# Patient Record
Sex: Male | Born: 1950 | Race: White | Hispanic: No | Marital: Married
Health system: Midwestern US, Community
[De-identification: ages and names within clinical notes are randomized; demographics above are authoritative.]

## PROBLEM LIST (undated history)

## (undated) DIAGNOSIS — M7989 Other specified soft tissue disorders: Secondary | ICD-10-CM

## (undated) DIAGNOSIS — N400 Enlarged prostate without lower urinary tract symptoms: Secondary | ICD-10-CM

## (undated) DIAGNOSIS — G4733 Obstructive sleep apnea (adult) (pediatric): Secondary | ICD-10-CM

## (undated) DIAGNOSIS — Z9989 Dependence on other enabling machines and devices: Secondary | ICD-10-CM

## (undated) DIAGNOSIS — I2510 Atherosclerotic heart disease of native coronary artery without angina pectoris: Secondary | ICD-10-CM

## (undated) DIAGNOSIS — I10 Essential (primary) hypertension: Secondary | ICD-10-CM

## (undated) DIAGNOSIS — R0681 Apnea, not elsewhere classified: Secondary | ICD-10-CM

## (undated) DIAGNOSIS — E669 Obesity, unspecified: Secondary | ICD-10-CM

## (undated) DIAGNOSIS — G4730 Sleep apnea, unspecified: Secondary | ICD-10-CM

## (undated) DIAGNOSIS — E785 Hyperlipidemia, unspecified: Secondary | ICD-10-CM

## (undated) DIAGNOSIS — K219 Gastro-esophageal reflux disease without esophagitis: Secondary | ICD-10-CM

## (undated) DIAGNOSIS — M21619 Bunion of unspecified foot: Secondary | ICD-10-CM

## (undated) DIAGNOSIS — R2240 Localized swelling, mass and lump, unspecified lower limb: Principal | ICD-10-CM

## (undated) DIAGNOSIS — M51369 Other intervertebral disc degeneration, lumbar region without mention of lumbar back pain or lower extremity pain: Secondary | ICD-10-CM

## (undated) DIAGNOSIS — M5136 Other intervertebral disc degeneration, lumbar region: Secondary | ICD-10-CM

## (undated) DIAGNOSIS — S83206S Unspecified tear of unspecified meniscus, current injury, right knee, sequela: Principal | ICD-10-CM

## (undated) DIAGNOSIS — M86671 Other chronic osteomyelitis, right ankle and foot: Principal | ICD-10-CM

## (undated) DIAGNOSIS — M5440 Lumbago with sciatica, unspecified side: Secondary | ICD-10-CM

## (undated) DIAGNOSIS — M869 Osteomyelitis, unspecified: Secondary | ICD-10-CM

## (undated) DIAGNOSIS — M21611 Bunion of right foot: Secondary | ICD-10-CM

## (undated) DIAGNOSIS — Z7189 Other specified counseling: Secondary | ICD-10-CM

## (undated) DIAGNOSIS — E7800 Pure hypercholesterolemia, unspecified: Secondary | ICD-10-CM

## (undated) DIAGNOSIS — R739 Hyperglycemia, unspecified: Secondary | ICD-10-CM

## (undated) DIAGNOSIS — G629 Polyneuropathy, unspecified: Secondary | ICD-10-CM

## (undated) DIAGNOSIS — E559 Vitamin D deficiency, unspecified: Secondary | ICD-10-CM

## (undated) DIAGNOSIS — G8929 Other chronic pain: Secondary | ICD-10-CM

## (undated) DIAGNOSIS — M8660 Other chronic osteomyelitis, unspecified site: Secondary | ICD-10-CM

## (undated) DIAGNOSIS — T847XXA Infection and inflammatory reaction due to other internal orthopedic prosthetic devices, implants and grafts, initial encounter: Secondary | ICD-10-CM

## (undated) DIAGNOSIS — S8991XD Unspecified injury of right lower leg, subsequent encounter: Secondary | ICD-10-CM

## (undated) DIAGNOSIS — M86172 Other acute osteomyelitis, left ankle and foot: Secondary | ICD-10-CM

## (undated) DIAGNOSIS — R7989 Other specified abnormal findings of blood chemistry: Secondary | ICD-10-CM

## (undated) DIAGNOSIS — M545 Low back pain, unspecified: Secondary | ICD-10-CM

## (undated) DIAGNOSIS — M25551 Pain in right hip: Secondary | ICD-10-CM

## (undated) DIAGNOSIS — K5903 Drug induced constipation: Secondary | ICD-10-CM

## (undated) DIAGNOSIS — L97511 Non-pressure chronic ulcer of other part of right foot limited to breakdown of skin: Secondary | ICD-10-CM

## (undated) DIAGNOSIS — Z298 Encounter for other specified prophylactic measures: Secondary | ICD-10-CM

## (undated) DIAGNOSIS — M5030 Other cervical disc degeneration, unspecified cervical region: Secondary | ICD-10-CM

## (undated) DIAGNOSIS — M19071 Primary osteoarthritis, right ankle and foot: Secondary | ICD-10-CM

## (undated) HISTORY — DX: Gastro-esophageal reflux disease without esophagitis: K21.9

## (undated) HISTORY — PX: TOTAL HIP ARTHROPLASTY: SHX124

## (undated) HISTORY — DX: Hyperlipidemia, unspecified: E78.5

## (undated) HISTORY — DX: Benign prostatic hyperplasia without lower urinary tract symptoms: N40.0

## (undated) HISTORY — DX: Obesity, unspecified: E66.9

## (undated) HISTORY — PX: CORONARY ARTERY BYPASS GRAFT: SHX141

## (undated) HISTORY — DX: Sleep apnea, unspecified: G47.30

## (undated) HISTORY — PX: FOOT SURGERY: SHX648

## (undated) HISTORY — DX: Gastro-esophageal reflux disease without esophagitis: R06.81

## (undated) HISTORY — DX: Obstructive sleep apnea (adult) (pediatric): Z99.89

## (undated) HISTORY — DX: Atherosclerotic heart disease of native coronary artery without angina pectoris: I25.10

## (undated) HISTORY — DX: Obstructive sleep apnea (adult) (pediatric): G47.33

## (undated) HISTORY — DX: Essential (primary) hypertension: I10

---

## 2000-04-08 ENCOUNTER — Other Ambulatory Visit: Admission: RE | Admit: 2000-04-08 | Discharge: 2000-04-08 | Payer: Self-pay | Admitting: Gastroenterology

## 2000-04-08 ENCOUNTER — Encounter (INDEPENDENT_AMBULATORY_CARE_PROVIDER_SITE_OTHER): Payer: Self-pay | Admitting: Specialist

## 2004-04-09 ENCOUNTER — Ambulatory Visit: Payer: Self-pay | Admitting: Family Medicine

## 2004-04-10 ENCOUNTER — Encounter: Admission: RE | Admit: 2004-04-10 | Discharge: 2004-04-10 | Payer: Self-pay | Admitting: Family Medicine

## 2004-04-10 ENCOUNTER — Ambulatory Visit: Payer: Self-pay | Admitting: Family Medicine

## 2004-04-14 ENCOUNTER — Ambulatory Visit: Payer: Self-pay | Admitting: Family Medicine

## 2004-05-18 ENCOUNTER — Ambulatory Visit: Payer: Self-pay | Admitting: Family Medicine

## 2006-08-30 ENCOUNTER — Encounter: Admission: RE | Admit: 2006-08-30 | Discharge: 2006-08-30 | Payer: Self-pay | Admitting: Internal Medicine

## 2006-12-29 DIAGNOSIS — N400 Enlarged prostate without lower urinary tract symptoms: Secondary | ICD-10-CM | POA: Insufficient documentation

## 2006-12-29 DIAGNOSIS — I10 Essential (primary) hypertension: Secondary | ICD-10-CM | POA: Insufficient documentation

## 2007-03-30 HISTORY — PX: CORONARY ARTERY BYPASS GRAFT: SHX141

## 2007-08-10 ENCOUNTER — Ambulatory Visit (HOSPITAL_BASED_OUTPATIENT_CLINIC_OR_DEPARTMENT_OTHER): Admission: RE | Admit: 2007-08-10 | Discharge: 2007-08-10 | Payer: Self-pay | Admitting: Internal Medicine

## 2007-08-22 ENCOUNTER — Ambulatory Visit: Payer: Self-pay | Admitting: Internal Medicine

## 2007-09-26 ENCOUNTER — Inpatient Hospital Stay (HOSPITAL_BASED_OUTPATIENT_CLINIC_OR_DEPARTMENT_OTHER): Admission: RE | Admit: 2007-09-26 | Discharge: 2007-09-26 | Payer: Self-pay | Admitting: Cardiology

## 2007-09-28 ENCOUNTER — Ambulatory Visit: Payer: Self-pay | Admitting: Cardiothoracic Surgery

## 2007-10-02 ENCOUNTER — Encounter: Payer: Self-pay | Admitting: Cardiothoracic Surgery

## 2007-10-03 ENCOUNTER — Ambulatory Visit: Payer: Self-pay | Admitting: Cardiothoracic Surgery

## 2007-10-03 ENCOUNTER — Inpatient Hospital Stay (HOSPITAL_COMMUNITY): Admission: AD | Admit: 2007-10-03 | Discharge: 2007-10-09 | Payer: Self-pay | Admitting: Cardiothoracic Surgery

## 2007-10-18 ENCOUNTER — Encounter: Admission: RE | Admit: 2007-10-18 | Discharge: 2007-10-18 | Payer: Self-pay | Admitting: Cardiology

## 2007-10-20 ENCOUNTER — Encounter: Admission: RE | Admit: 2007-10-20 | Discharge: 2007-10-20 | Payer: Self-pay | Admitting: Cardiothoracic Surgery

## 2007-10-26 ENCOUNTER — Encounter: Admission: RE | Admit: 2007-10-26 | Discharge: 2007-10-26 | Payer: Self-pay | Admitting: Cardiothoracic Surgery

## 2007-10-26 ENCOUNTER — Ambulatory Visit: Payer: Self-pay | Admitting: Cardiothoracic Surgery

## 2007-10-26 ENCOUNTER — Encounter (HOSPITAL_COMMUNITY): Admission: RE | Admit: 2007-10-26 | Discharge: 2008-01-24 | Payer: Self-pay | Admitting: Cardiology

## 2007-11-16 ENCOUNTER — Ambulatory Visit: Payer: Self-pay | Admitting: Cardiothoracic Surgery

## 2008-07-22 ENCOUNTER — Ambulatory Visit (HOSPITAL_BASED_OUTPATIENT_CLINIC_OR_DEPARTMENT_OTHER): Admission: RE | Admit: 2008-07-22 | Discharge: 2008-07-22 | Payer: Self-pay | Admitting: Internal Medicine

## 2008-07-31 ENCOUNTER — Ambulatory Visit: Payer: Self-pay | Admitting: Internal Medicine

## 2008-08-18 IMAGING — CR DG CHEST 2V
2 series · 2 of 2 positions shown · non-contrast
Comparison: 10/18/2007

CLINICAL DATA: Chest pain.

CHEST - 2 VIEW

[w chest pa]
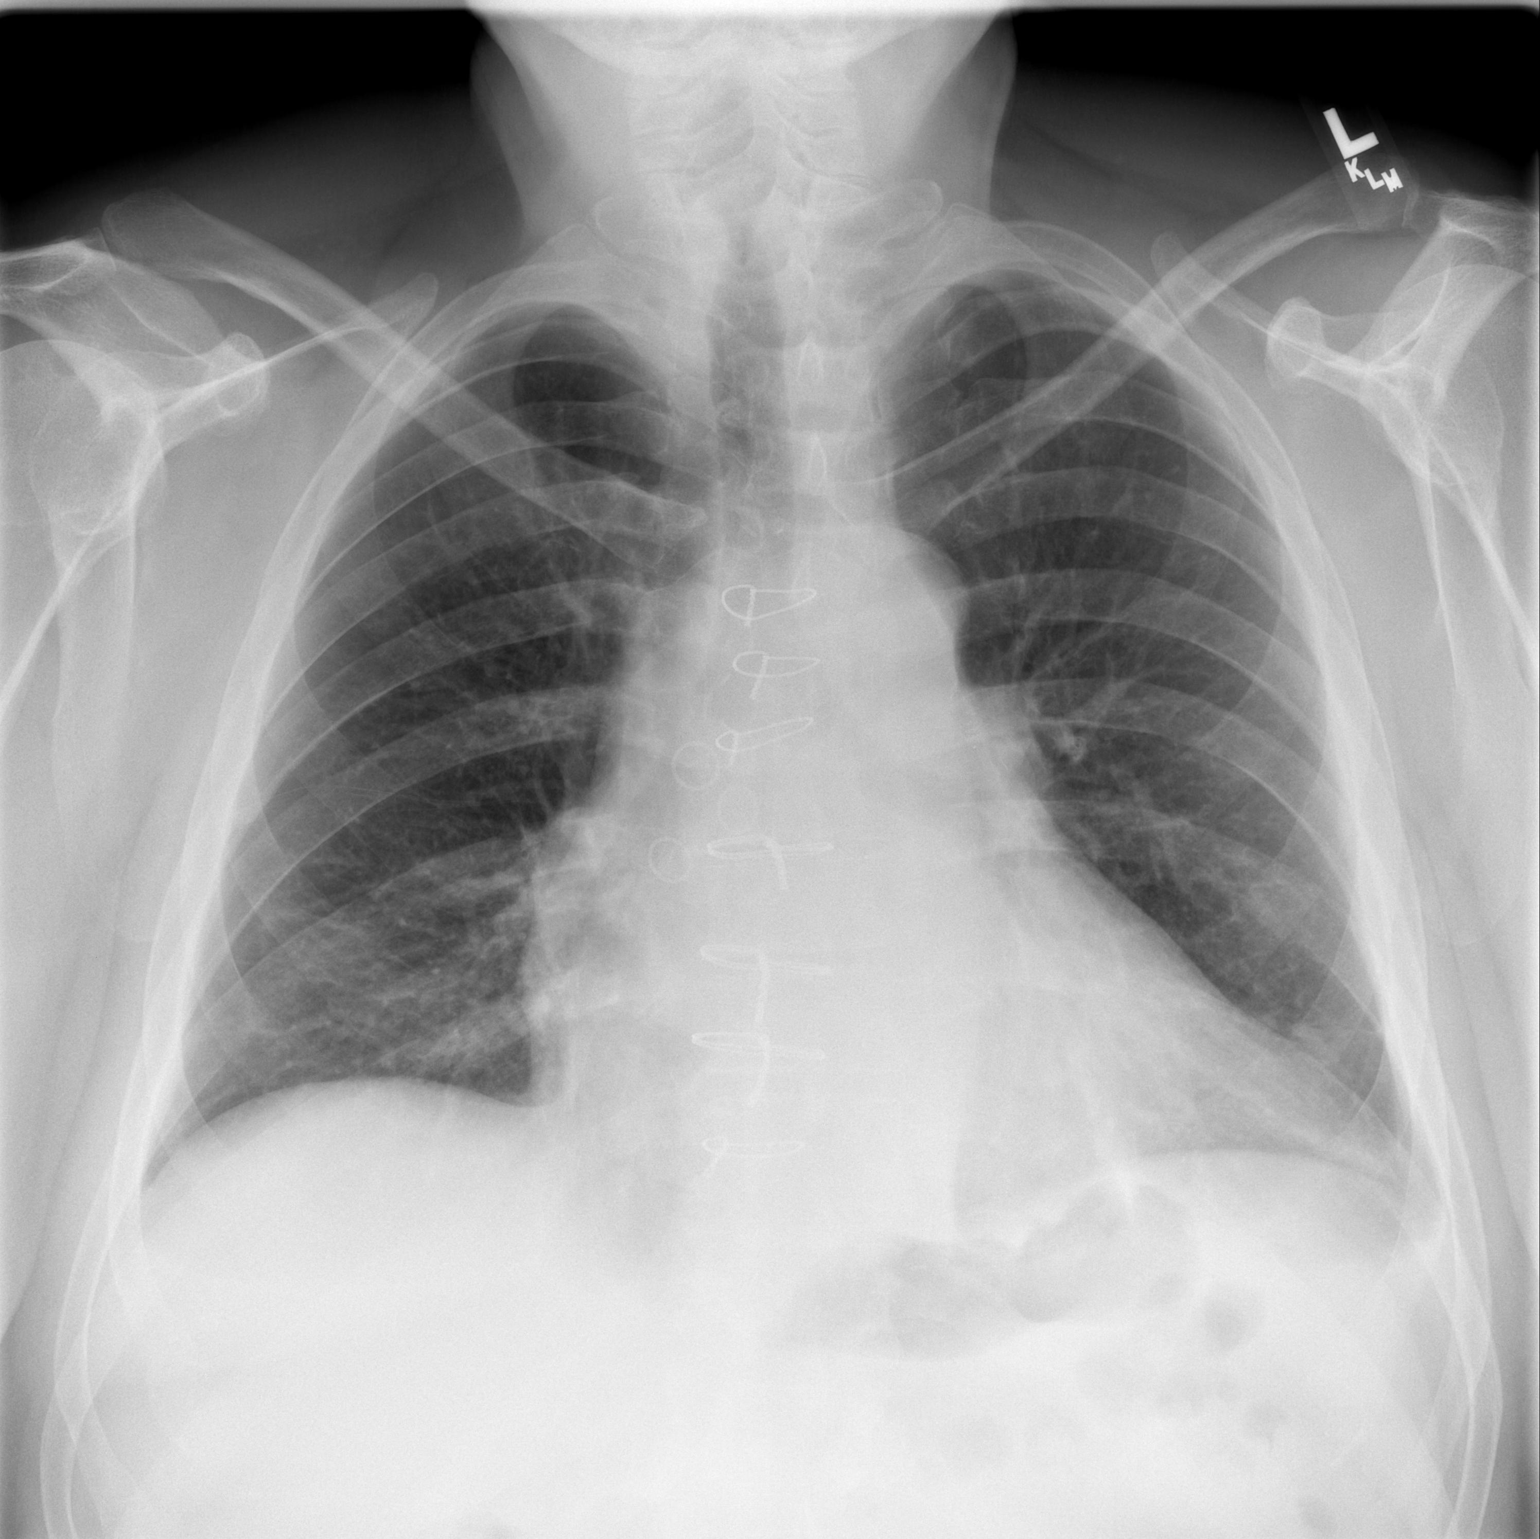

[w chest lat]
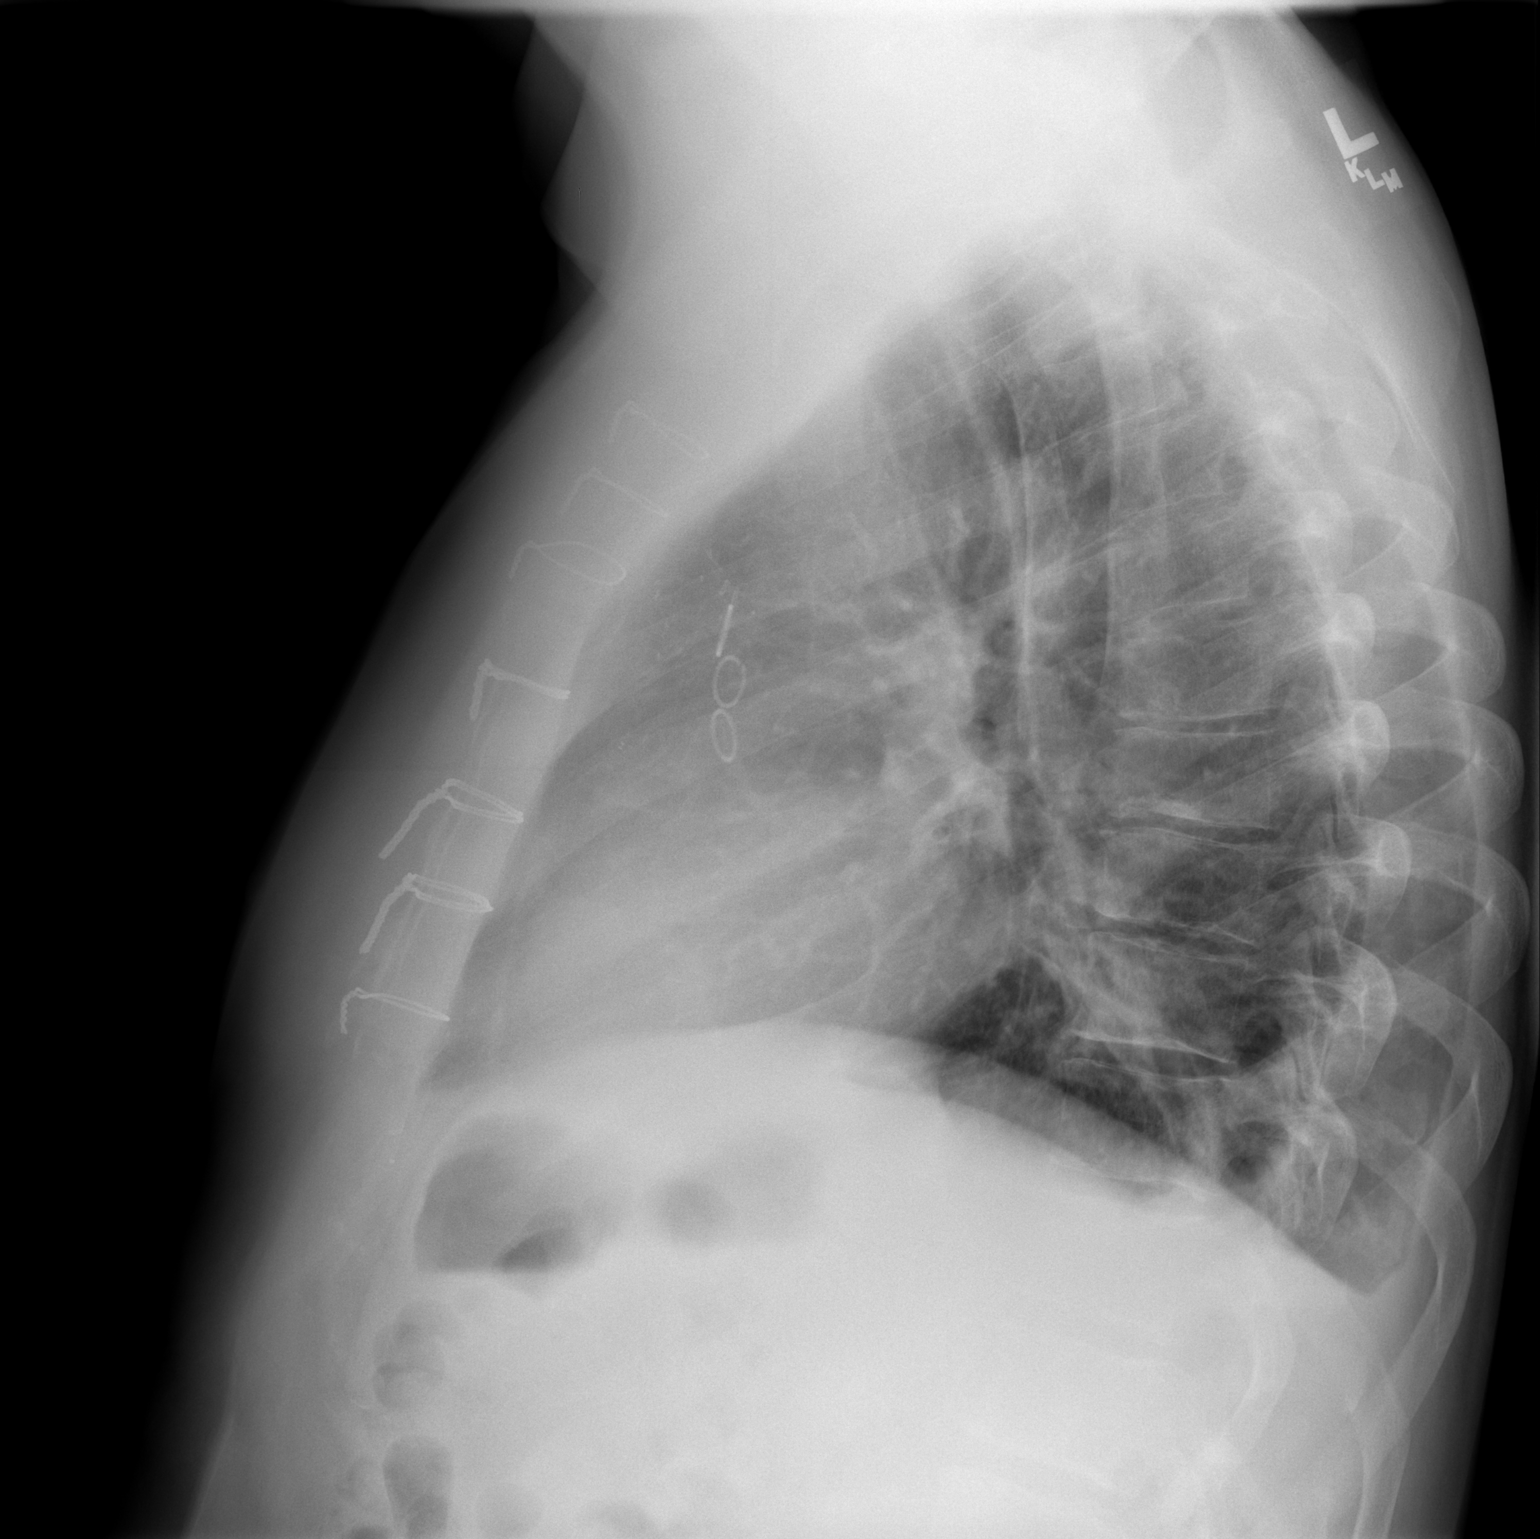

[2 of 2 positions shown; findings below may reference images not displayed]

FINDINGS: The cardiac silhouette, mediastinal and hilar contours
are stable.  There are stable surgical changes related to bypass
surgery.  Slightly lower lung volumes with streaky bibasilar
atelectasis.  There is a tiny left-sided pleural effusion.
IMPRESSION: 1.  Lower lung volumes with slight increase in vascular crowding
and bibasilar atelectasis.
2.  Very small left pleural effusion.

## 2008-08-24 IMAGING — CR DG CHEST 2V
2 series · 2 of 2 positions shown · non-contrast
Comparison: 10/20/2007

CLINICAL DATA: Chest pain.  CABG.

CHEST - 2 VIEW

[w chest pa]
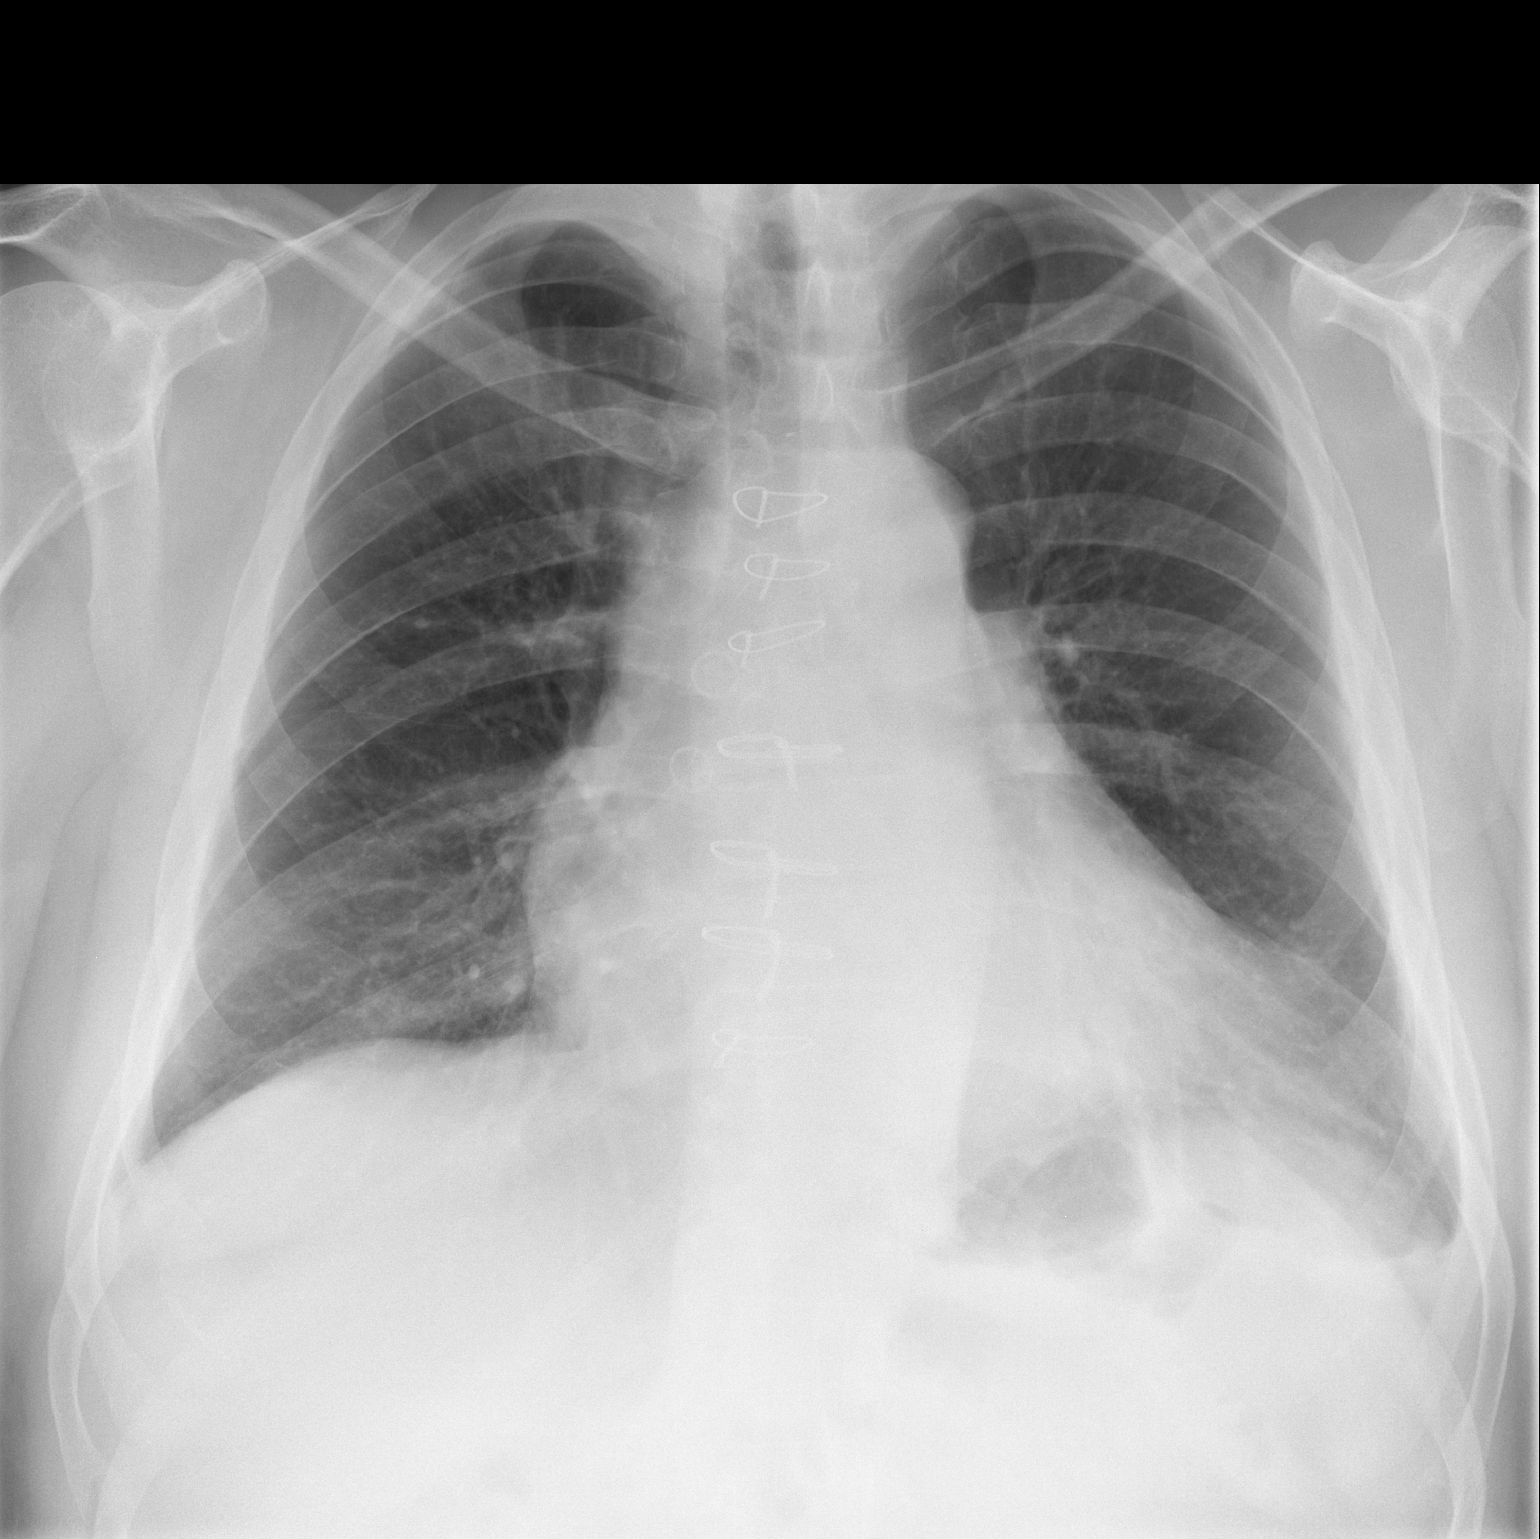

[w chest lat]
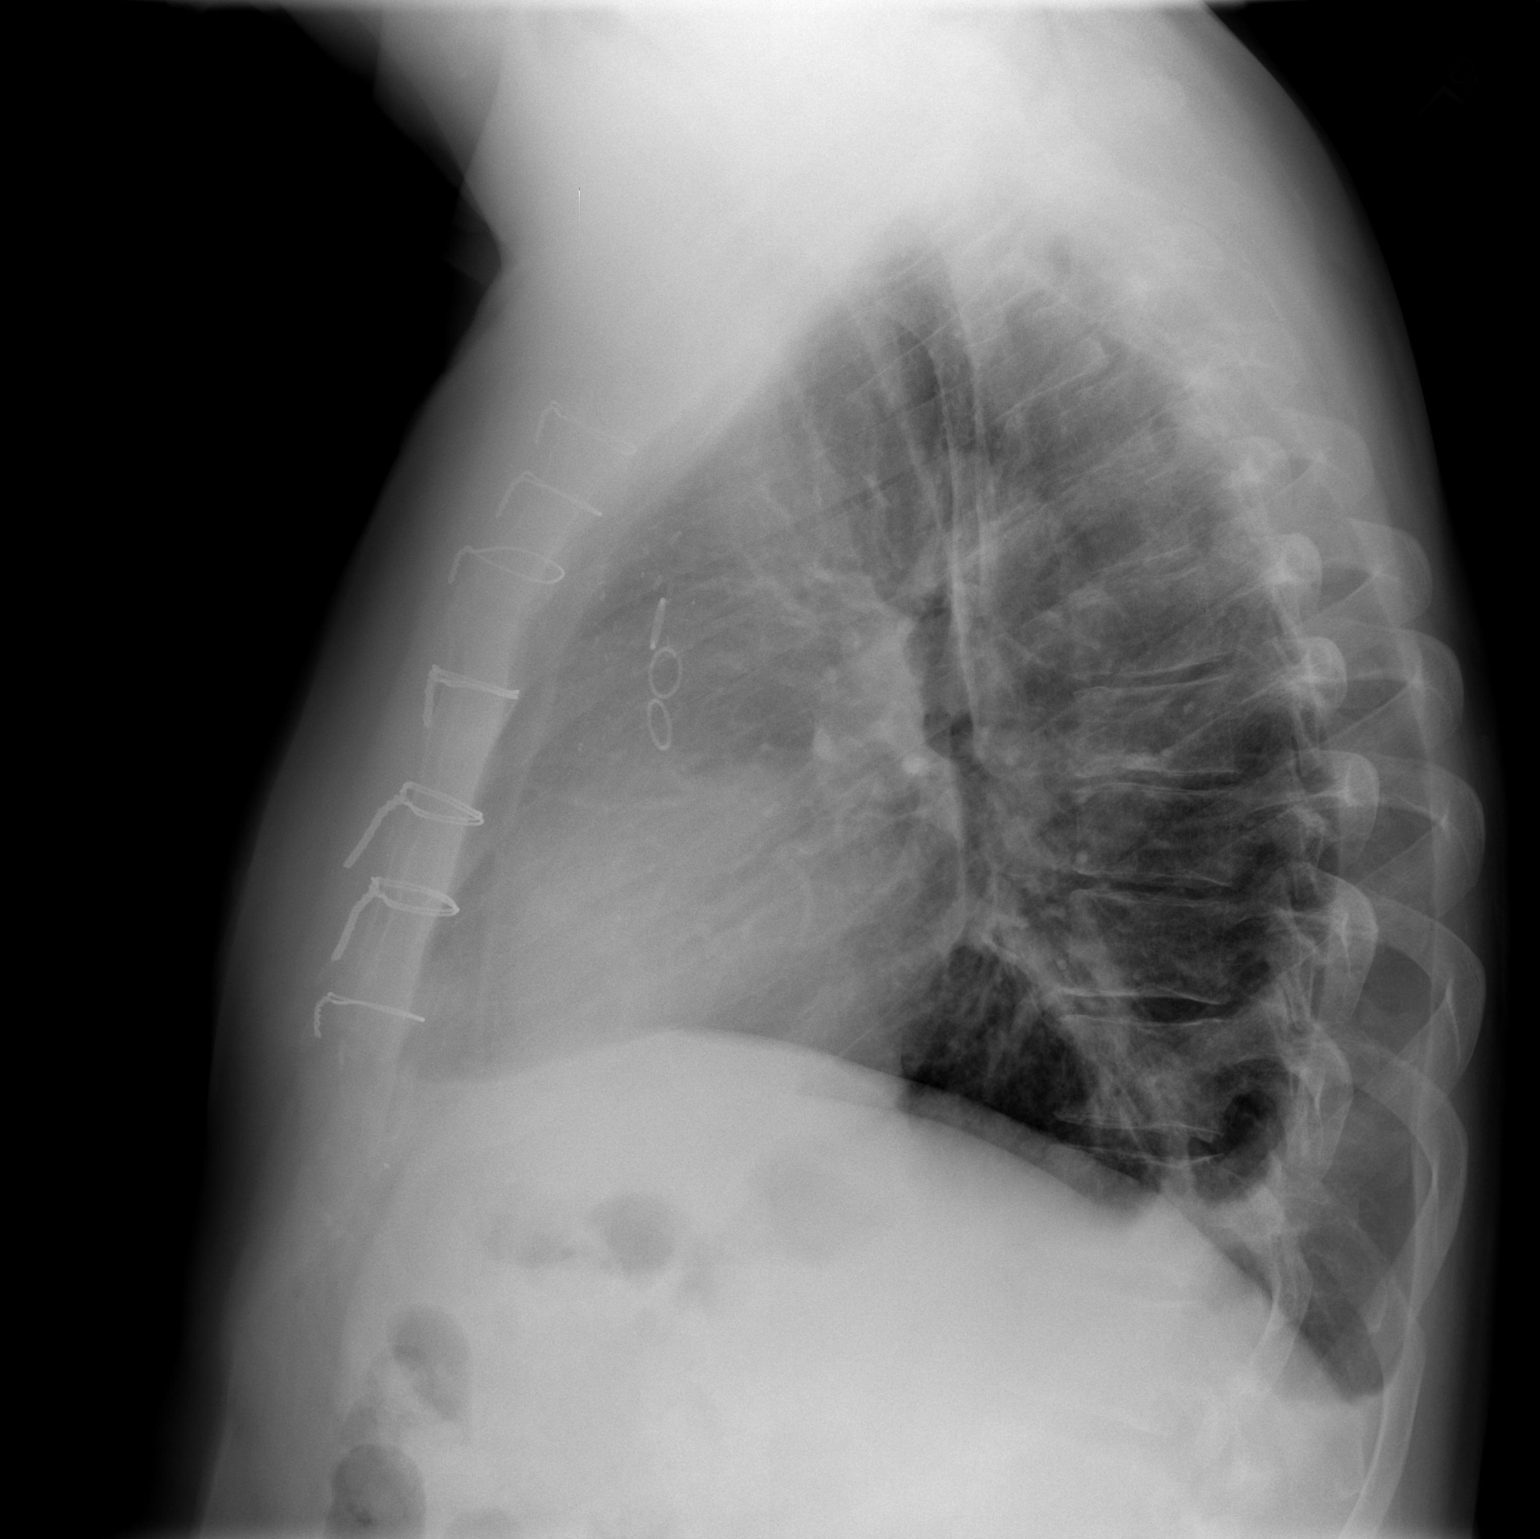

[2 of 2 positions shown; findings below may reference images not displayed]

FINDINGS: Minimal pleural effusions.  Atelectatic changes at the
left base are stable.  Cardiomegaly.  No pulmonary vascular
congestion.  No pneumothorax.
IMPRESSION: Cardiomegaly.  Minimal pleural effusions.  Left lower lobar
atelectatic changes appear unchanged.

## 2010-08-11 NOTE — Consult Note (Signed)
Aaron [name], Aaron [name] NO.:  [id number]   MEDICAL RECORD NO.:  [id number]           PATIENT TYPE:   LOCATION:                                 FACILITY:   PHYSICIAN:  Sheliah Plane, MD    DATE OF BIRTH:  [birth year]   DATE OF CONSULTATION:  DATE OF DISCHARGE:                                 CONSULTATION   FOLLOW UP CARDIOLOGIST:  Mark C. Anne Fu, MD   PRIMARY CARE PHYSICIAN:  Thora Lance, MD   REASON FOR CONSULTATION:  Coronary artery disease.   HISTORY OF PRESENT ILLNESS:  The patient is a 61 year old male with no  previous history of myocardial infarction who presents with a 5-6 week  history of increasing shortness of breath with exertion and episodic  chest tightness.  He noticed it while walking up stairs approximately 5  weeks ago.  He had noted some symptoms.  He gives no history of prior  known cardiac disease, has had no previous history of myocardial  infarction, has mild lower extremity edema.  Because of these symptoms,  he underwent a Cardiolite stress test on September 22, 2007, which showed  evidence of moderate-to-severe ischemia in the mid anterior septal,  apical septal, mid inferior and apical inferior walls with ejection  fraction of 50%, global left ventricular systolic function is mildly  reduced.  Cardiac catheterization was recommended which was carried out  by Dr. Anne Fu on September 26, 2007.  The patient is now referred for  consideration of coronary artery bypass grafting.   CARDIAC RISK FACTORS:  The patient has known hypertension.  He has known  hyperlipidemia and has been treated with Crestor.  He denies diabetes,  is a remote smoker, quitting more than 20 years ago.  He has had no  previous history of stroke or claudication.   PAST MEDICAL HISTORY:  Positive for recent diagnosis of sleep apnea.   He has had no previous surgical procedures done.   SOCIAL HISTORY:  The patient is married, lives with his wife, employed  as an  Solicitor, is retired Buyer, retail, has 2-3 beers per  day.   FAMILY HISTORY:  The patient's father had myocardial infarction at age  16.   CURRENT MEDICATIONS:  1. Crest 410 mg a day.  2. Lopressor 25 mg a day.  3. Lisinopril 20 mg a day.  4. Norvasc 5 mg a day.  5. Aspirin 81 mg two a day.   The patient has had trouble with STATINS in the past.  Currently, he is  tolerating 5 mg of Crestor a day.   Cardiac review of systems is positive for exertional pain, discomfort,  and shortness of breath when climbing stairs, mild lower extremity  edema.  Denies resting shortness of breath.  Denies orthopnea,  presyncope, syncope, or palpitations.   GENERAL REVIEW OF SYSTEMS:  GENERAL:  The patient has had 20-pound  weight gain over the past year.  He denies amaurosis or TIAs.  CARDIAC:  As above.  RESPIRATORY:  Denies hemoptysis.  Denies wheezing.  GI:  Denies change in bowel habits.  He has had colonoscopies in the past at  least twice, once last 6 months ago.  He has a history of hemorrhoids  with hemorrhoidal bleeding in the past.  GU:  Denies hematuria.  He has  a history of hypogonadism, urinary frequency, urgency, and nocturia x2.  MUSCULOSKELETAL:  Notes significant right hip pain and has been  considering right hip replacement.  Denies psychiatric history.  Denies  polyuria or polydipsia.   PHYSICAL EXAMINATION:  VITAL SIGNS:  The patient's blood pressure is  176/100, pulse is 80, respiratory rate is 18, and O2 sat is 96%.  The  patient's height is 62 inches tall and weighs 310 pounds.  GENERAL:  The patient is alert, neurologically intact, and able to  relate his history without difficulty.  He has no carotid bruits.  LUNGS:  Clear bilaterally.  CARDIAC:  Reveals regular rate and rhythm without murmur or gallop.  ABDOMEN:  Moderately obese without palpable masses or organomegaly.  The  abdominal aorta is not palpably enlarged.   The patient's laboratory findings are  of significance and that his  current hematocrit is 55.3 and hemoglobin 19.  His creatinine is 1.3.  I  have called Dr. Anne Fu' office and obtained blood work from previous  exams, and in January 2009 his hematocrit was 50, hemoglobin 17.5, and  in 2008 the patient's hematocrit was 47.4 and hemoglobin of 16.1.  Platelet counts have been normal.  Cardiac catheterization films are  reviewed.  The patient has 70% proximal LAD lesion and a mid 90% lesion,  ramus has 95% lesion, has a 60%-70% lesion in the first OM.  Ejection  fraction is 50%-55%.  The right coronary artery is subtotally occluded  with left-to-right collaterals.   The patient with positive stress test, exertional symptoms, and three-  vessel coronary artery disease that does not appear amenable to  angioplasty.  I agree with Dr. Anne Fu that coronary artery bypass  grafting is recommended.  I have discussed this recommendation with the  patient and his wife including the risks of surgery which is death,  infection, stroke, myocardial infarction, bleeding, and blood  transfusion.  He is willing to proceed.  We will tentatively arrange for  surgery on October 03, 2007.  We will obtain preop laboratory findings and  recheck his hemoglobin and hematocrit; however, these seem to be  chronically elevated and further evaluation of this may be needed  postoperatively.  The patient is on beta-blocker; however, he has no  nitroglycerin tablets.  He was given a prescription for nitroglycerin  sublingual.  Should he have chest discomfort to take nitroglycerin and  call 911, come to the emergency room prior to surgery, is encouraged not  to be involved in any heavy physical activity until that time.      Sheliah Plane, MD  Electronically Signed     EG/MEDQ  D:  09/28/2007  T:  09/29/2007  Job:  [id number]   cc:   Thora Lance, M.D.  Jake Bathe, MD

## 2010-08-11 NOTE — Cardiovascular Report (Signed)
Aaron [name], Aaron [name] NO.:  [id number]   MEDICAL RECORD NO.:  [id number]          PATIENT TYPE:  OIB   LOCATION:  1962                         FACILITY:  [facility]   PHYSICIAN:  Jake Bathe, MD      DATE OF BIRTH:  1951-03-01   DATE OF PROCEDURE:  09/26/2007  DATE OF DISCHARGE:                            CARDIAC CATHETERIZATION   PROCEDURE:  Left heart catheterization, selective coronary angiography,  left ventriculogram, subclavian arteriogram, and selective LIMA.   INDICATIONS:  A 60 year old male with hypertension on multidrug regimen,  hyperlipidemia, obesity with chest pain, concerning for angina, as well  as nuclear stress test demonstrating a large anteroseptal, apical  septal, apical, inferoapical wall reversible defect suggestive of  significant ischemia.  He also has a basal inferior scar.  Ejection  fraction on nuclear stress was 50%.   PROCEDURE DETAILS:  Informed consent was obtained.  Risks and benefits  of the procedure including stroke, heart attack, death, renal impairment  were discussed with the patient at length including bleeding and damage  to artery.  Placed on the catheterization table, prepped in a sterile  fashion, 1% lidocaine was used for local anesthesia.  Visualization of  the femoral head was used with fluoroscopy.  A modified Seldinger  technique was used to implant a 4-French sheath into the right femoral  artery.  A Judkins left #5 catheter was used after attempting a JL-4 and  a JL-6 to selectively cannulate the left main artery.  Multiple views  with Omnipaque hand injection were obtained.  This catheter was  exchanged for a no torque Williams right catheter, which was used to  selectively cannulate the right coronary artery.  Multiple views of  Omnipaque hand injection were obtained.  This catheter was then  exchanged for an angled pigtail, which was used to cross the aortic  valve in the left ventricle.  Power injection with  30 mL of dye was used  in the RAO position for left ventriculogram.  This catheter was then  exchanged once again for the no torque Williams right catheter, which  was used to selectively cannulate the subclavian artery and the LIMA.  Two views with hand injection were obtained.  Following the procedure,  sheath was removed and manual compression held.  The patient tolerated  the procedure well with no complications.  Findings were discussed with  both the patient and his son.   FINDINGS:  1. Left main artery - no significant disease, bifurcates into the left      anterior descending and circumflex system.  There is also a high      diagonal/ramus branch.  2. Left anterior descending artery - this vessel bifurcates shortly      after a small septal branch giving rise to a large diagonal branch.      This large diagonal branch then bifurcates in its proximal to mid      segment.  There was approximately 70% stenosis of both the LAD and      first diagonal at the ostium of the first diagonal bifurcation.  The LAD then continues and there is a 95% stenosis in the mid      segment.  There is also a distal 60% stenosis at the apex of the      LAD.  The first diagonal branch as noted has an ostial 60%-70%      stenosis.  There was also at the bifurcation of this diagonal      branch a significant 70%-80% stenosis.  3. Ramus/high diagonal - there is 99% ostial stenosis of this small-to-      moderate sized ramus branch.  4. Circumflex artery - circumflex artery has a large first obtuse      marginal, which then bifurcates.  There are two other distal obtuse      marginal branches along the inferolateral wall.  There is proximal      50% circumflex stenosis before the first obtuse marginal.  Within      the first obtuse marginal, there is 50% proximal and at its      bifurcation, there is 70%-80% stenosis at that bifurcations ostium.      There are minor irregularities throughout the  remainder of the      circumflex/obtuse marginal system.  5. Right coronary artery - this is the dominant vessel giving rise to      the PDA.  There is a 99%-100% occlusion of the mid right coronary      artery with minimal flow across this lesion supplying the remainder      of the vessel.  There are noticeable left-to-right collaterals from      the distal circumflex system.  6. Left ventriculogram - there is basal inferior wall akinesis with      mid-to-apical inferior hypokinesis.  No significant mitral      regurgitation is observed.   HEMODYNAMICS:  1. Left ventricle 107/12, aortic pressure 107/64 with a mean of 82.  2. Subclavian - widely patent.  No stenosis.  3. Left internal mammary artery - widely patent.  No stenosis.   IMPRESSION:  1. Severe multivessel coronary artery disease, as described above.  2. Left ventricular ejection fraction estimated at 50%-55% with      inferior wall motion abnormality.  3. Widely patent subclavian and left internal mammary artery.   RECOMMENDATIONS:  Findings were discussed with both the patient and his  son.  A consult was placed with Dr. Sheliah Plane from Cardiothoracic  Surgery for bypass consultation.  I will perform an echocardiogram.  Blood pressure very well controlled during case with multidrug regimen.  It was quite elevated prior to stress test.  The patient is aware that  if chest pain becomes more concerning or unstable to seek prompt medical  attention. Note hemoglobin 15 (polycythemia). Non smoker. Will need  further evaluation.      Jake Bathe, MD  Electronically Signed     MCS/MEDQ  D:  09/26/2007  T:  09/26/2007  Job:  [id number]   cc:   Sheliah Plane, MD  Thora Lance, M.D.

## 2010-08-11 NOTE — Discharge Summary (Signed)
NAMEGRIER, VU NO.:  [id number]   MEDICAL RECORD NO.:  [id number]          PATIENT TYPE:  INP   LOCATION:  2001                         FACILITY:  [facility]   PHYSICIAN:  Sheliah Plane, MD    DATE OF BIRTH:  1950/06/23   DATE OF ADMISSION:  10/03/2007  DATE OF DISCHARGE:                               DISCHARGE SUMMARY   FINAL DIAGNOSIS:  Coronary occlusive disease.   IN-HOSPITAL DIAGNOSES:  1. Urinary tract infection, postoperatively.  2. Volume overload, postoperatively.   SECONDARY DIAGNOSES:  1. Hypertension.  2. Hyperlipidemia.   IN-HOSPITAL OPERATIONS AND PROCEDURES:  Coronary artery bypass grafting  x5 using a left internal mammary artery to the left anterior descending  coronary artery, a reverse saphenous vein graft to diagonal coronary  artery, sequential reverse saphenous vein graft to first and second  obtuse marginal, reverse saphenous vein graft to posterior descending  coronary artery with right thigh and calf endovein harvesting.   HISTORY AND PHYSICAL AND HOSPITAL COURSE:  The patient is a 60 year old  male who presents with increasing dyspnea on exertion with a positive  stress test and recent cardiac catheterization by Dr. Anne Fu, which  demonstrated a high-grade stenosis of the LAD, diagnosed 70% stenosis  involving the first and second obtuse marginal.  The right coronary  artery was totally occluded.  The patient had some inferior hypokinesis,  overall preserved LV function.  Dr. Tyrone Sage was consulted and  following.  He saw and evaluated patient.  He discussed with patient  undergoing coronary artery bypass grafting. The risks and benefits were  discussed.  The patient acknowledged understanding and agreed to  proceed.  Surgery was scheduled for October 03, 2007.  For details of the  patients' past medical history and physical exam, please see dictated H  and P.   The patient was taken to the operating room on October 03, 2007, where  he  underwent coronary artery bypass grafting x5 using a left internal  mammary artery to left anterior descending coronary artery, a reverse  saphenous vein graft to diagonal coronary artery, sequential reverse  saphenous vein graft to first and second obtuse marginal, the reverse  saphenous vein graft to posterior descending coronary artery with right  thigh and calf endovein harvesting.  The patient tolerated this  procedure well and was transferred to the Intensive Care Unit in stable  condition.  Postoperatively, the patient was noted to be hemodynamically  stable.  He was extubating on the evening of surgery.  Post extubation,  the patient was noted to be alert and oriented x4, neuro intact.  Postoperatively, the patient was febrile.  It was initially felt that  this is secondary to atelectasis.  Tylenol was given.  The patient  continued to have persistent fever.  White blood cell count elevated on  postop day 2 to 18.4.  Chest x-ray was stable.  There were no  superficial signs of infection.  Urinalysis was obtained.  This showed  trace leukocytes with rare bacteria.  The patient continued to have  persistent fever.  On postop day 3, although the white count  dropped to  12.7 following Foley removal.  Due to persistent fever and trace  leukocytes in his urine, the patient was started on ciprofloxacin for a  total of 3 days.  We will continue to monitor the patient following his  fever and white blood cell counts.  From a pulmonary standpoint, the  patient was noted be stable.  Chest x-ray showed no significant  effusions and chest tubes had minimum drainage and were discontinued in  normal fashion.  He was encouraged to use incentive spirometer.  The  patient was able to be weaned off of his oxygen, sating greater than 90%  on room air.  Postoperatively, the patient was noted to be in  normal  sinus rhythm.  He has restarted on a low-dose beta-blocker.  Heart rate  and blood  pressure remained stable.  His blood pressure was high stable,  and he was restarted on a low-dose ACE inhibitor.  We will continue to  monitor with this.  The patient did have a brief episode of atrial  flutter on postop day 2, which he converted on his own to normal sinus  rhythm.  He has currently remained in normal sinus rhythm since.  The  patient did have a volume overload postoperatively.  He has started on  diuretics.  Daily weights were obtained, and he was improving slowly.  The patient is currently 3.9 kg above his preoperative weight.  Postoperatively, the patient was working with cardiac rehab.  He was out  of bed, ambulating well without difficulty.  He was tolerating diet  well.  No nausea or vomiting noted.   Postop day 3, the patients' lab showed a white count of 12.7, hemoglobin  13.3, hematocrit 38.8, and platelet count 135.  Sodium of 136, calcium  3.9, chloride of 100, bicarbonate of 29,  BUN of 17, creatinine of 1.46,  and glucose of 105.  We will obtain repeat vitals in the a.m. to see if  the patient's fever is resolved as well as check CBC and BMP for further  evaluation.  The patient is stable and off oxygen.  The patient was  essentially ready for discharge home in the a.m. postop day 4, October 07, 2007.   FOLLOWUP APPOINTMENTS:  Followup appointment has been arranged with Dr.  Tyrone Sage for October 26, 2007 at 10:30 a.m.  The patient will need to  follow up with Dr. Anne Fu in 2 weeks.  Prior to his appointment with Dr.  Tyrone Sage, he will need to obtain PA and lateral chest x-ray, 30 minutes  prior.   ACTIVITY:  The patient was instructed no driving, he agrees to do so, no  heavy lifting over 10 pounds.  He was told to ambulate 3-4 times per  day, progress as tolerated and continue his breathing exercises.   INCISIONAL CARE:  The patient was told to shower, wash his incisions  using soap and water.  He is to contact the office if he develops any  drainage or  bleeding from any of his incision sites.   DIET:  The patient is to begin on diet to be low fat and low salt.   DISCHARGE MEDICATIONS:  1. Crestor 5 mg daily p.r.n.  2. Lisinopril 5 mg daily.  3. Aspirin 325 mg daily.  4. Toprol-XL 25 mg daily.  5. Lasix 40 mg daily x5 days.  6. Potassium chloride 20 mEq daily x5 days.  7. Vicodin 5/325 1-2 tabs q.4-6 h. p.r.n.  8. Ciprofloxacin 500  mg b.i.d. x2 days.     Note:Patrient  was noted to have elevated Hgb/Hct preop  to evaluate  this ferritin level was 113  (nl 22 to 322) Carboxyhemoglobin 0.7 (0.5-  1.5) and Erythropoietin level drawen preop at 7am was 25.0 (2.6-34.0).      Theda Belfast, [place]      Sheliah Plane, MD  Electronically Signed    KMD/MEDQ  D:  10/06/2007  T:  10/07/2007  Job:  [id number]   cc:   Jake Bathe, MD  Thora Lance, M.D.

## 2010-08-11 NOTE — Op Note (Signed)
NAMECYLUS, DOUVILLE NO.:  [id number]   MEDICAL RECORD NO.:  [id number]          PATIENT TYPE:  INP   LOCATION:  2016                         FACILITY:  [facility]   PHYSICIAN:  Sheliah Plane, MD    DATE OF BIRTH:  May 21, 1950   DATE OF PROCEDURE:  10/03/2007  DATE OF DISCHARGE:                               OPERATIVE REPORT   PREOPERATIVE DIAGNOSIS:  Coronary occlusive disease.   POSTOPERATIVE DIAGNOSIS:  Coronary occlusive disease.   SURGICAL PROCEDURES:  Coronary artery bypass grafting x5 with the left  internal mammary to the left anterior descending coronary artery,  reverse saphenous vein graft to the diagonal coronary artery, sequential  reverse saphenous vein graft to the first and second obtuse marginal,  reverse saphenous vein graft to the posterior descending coronary artery  with right thigh and calf endovein harvesting.   SURGEON:  Sheliah Plane, MD   FIRST ASSISTANT:  Salvatore Decent. Dorris Fetch, MD   SECOND ASSISTANT:  Gershon Crane, PA   BRIEF HISTORY:  The patient is a 60 year old male who presents with  increasing dyspnea on exertion, positive stress test, and recent cardiac  catheterization by Dr. Harriette Bouillon which demonstrated high-grade stenosis of  the LAD, diagonal 70% stenoses involving the first and second obtuse  marginal.  The proper distal circ was without significant disease.  The  right coronary artery was totaled.  He had some inferior hypokinesis,  but overall preserved LV function.  Coronary artery bypass grafting was  recommended to the patient who agreed and signed consent.   DESCRIPTION OF PROCEDURE:  With Swan-Ganz and arterial line monitors in  place, the patient underwent general endotracheal anesthesia without  incidence in the chest and legs, prepped with Betadine and draped in the  usual sterile manner.  Using the Guidant endovein harvesting system, the  vein was harvested from the right thigh and calf was of good quality and  caliber.  Median sternotomy was performed.  Left internal mammary artery  was dissected down as pedicle graft.  The distal artery was divided and  had good free flow.  Pericardium was opened.  Overall, ventricular  function appeared preserved, though the patient did have evidence of  inferior hypokinesis.  He was systemically heparinized.  Ascending aorta  and the right atrium were cannulated and aortic root vent cardioplegia  needle was introduced into the ascending aorta.  The patient was placed  on cardiopulmonary bypass 2.4 liters per minute per meter square.  Because of the patient's large size of the body surface area of 2.6, a  large arterial cannula was used, 22-French.  Sites of anastomosis were  dissected out of epicardium.  Aortic cross-clamp was applied and 500 mL  of cold blood potassium cardioplegia was administered.  Good diastolic  arrest of the heart.  Myocardial septal temperatures were monitored  throughout the cross-clamp.  Attention was turned first to the posterior  descending coronary artery.  Several small acute marginal branches were  identified but were too small to bypass the posterior descending.  Coronary artery was relatively small, admitted a 1-mm probe.  It was  approximately  1.3 mm in size.  Using a running 7-0 Prolene, distal  anastomosis was performed.  Attention was then turned to the first and  second obtuse marginals which were 1.6-1.7 mm in size.  Using a short  natural wire, end-to-side anastomosis was carried out.  Distal extent  same vein was then carried to the distal to the second obtuse marginal  which was of similar size.  A running 7-0 Prolene used for distal  anastomosis high in the intermediate vessel was identified.  This was so  small and was not bypassed.  The diagonal coronary artery was opened and  admitted 1.5-mm probe.  Using a running 7-0 Prolene, distal anastomosis  was performed.  Attention was then turned to the left anterior   descending coronary artery which had some distal disease but the  midportion of the vessel was a good quality vessel using a running 8-0  Prolene, the left internal mammary artery was anastomosed to the left  anterior descending coronary artery.  With release of the bulldog on  mammary artery, there was rise in myocardial septal temperature.  The  bulldog was placed back on the mammary artery and additional cold blood  cardioplegia was administered intermittently in the root and also down  the vein grafts.  With the aortic cross-clamp still in place, a three  punch aortotomies were performed.  Each of three vein grafts anastomosed  to the ascending aorta.  Air was evacuated from the grafts and the  ascending aorta.  Aortic cross-clamp was removed.  Total cross-clamp  time of 111 minutes.  The patient spontaneously converted to a sinus  rhythm.  Sites of anastomosis were inspected and free of bleeding.  The  patient was then ventilated and weaned from cardiopulmonary bypass.  He  remained hemodynamically stable.  He was decannulated in usual fashion.  Protamine sulfate was administered with operative field hemostatic.  Two  atrial and two ventricular pacing wires were applied.  Graft markers  were applied.  Left pleural tube and a Blake mediastinal drain were left  in place.  Pericardium was reapproximated.  Sternum was closed #6  stainless steel wire.  Fascia closed with interrupted 0 Vicryl and 3-0  Vicryl subcutaneous tissue, 4-0 subcuticular stitch in skin edges.  Dry  dressings were applied.  Sponge and needle count was reported as correct  at the completion of procedure.  The patient tolerated the procedure  without obvious complication.  He was transferred to surgical intensive  care unit for further postoperative care.      Sheliah Plane, MD  Electronically Signed     EG/MEDQ  D:  10/05/2007  T:  10/05/2007  Job:  [id number]   cc:   Jake Bathe, MD

## 2010-08-11 NOTE — Discharge Summary (Signed)
NAMECALIX, Aaron [name] NO.:  [id number]   MEDICAL RECORD NO.:  [id number]          PATIENT TYPE:  INP   LOCATION:  2001                         FACILITY:  [facility]   PHYSICIAN:  Theda Belfast, PA DATE OF BIRTH:  December 09, 1950   DATE OF ADMISSION:  10/03/2007  DATE OF DISCHARGE:                               DISCHARGE SUMMARY   ADDENDUM   This is an addendum to the patient's discharge summary dictated on October 06, 2007.  The patient was tentatively ready for discharge home postop  day #4, October 07, 2007.  Unfortunately, the patient went into atrial  fibrillation with heart rate in the 120s.  His Lopressor was increased,  but the patient continued to remain in atrial fibrillation.  He was  started on IV amiodarone at this time.  Following the initiation of IV  amiodarone, the patient was able to convert back to normal sinus rhythm.  Postop day #5, the patient remained in normal sinus rhythm and IV  amiodarone was discontinued, and the patient was switched over to p.o.  amiodarone.  Currently, the patient is in normal sinus rhythm.   PLAN:  If he remains in normal sinus rhythm, he potentially will be  ready for discharge home in the a.m. postop day #6 October 09, 2007.  During this time, the patient also became hypertensive.  As stated  above, the patient's Lopressor was increased and his lisinopril was  increased further.  We will continue to monitor his blood pressure and  adjust the medications as needed.  Otherwise, the patient remained  stable.  He continued to ambulate 3 to 4 times per day.  Appetite is  slowly improving.  All incisions remained clean, dry, intact, and  healing well.  He did have BMP done on postop day #5 showing a sodium of  139, potassium 3.9, chloride of 108, bicarb of 25, BUN of 12, creatinine  of 1.04, glucose of 113.  Last CBC done postop day #4 showed a white  count 8.2, hemoglobin 13.3, hematocrit 39.0, platelet count 188,000.  The patient  is currently only 1.5 kg above his preoperative weight.  During this time, he did finish up his ciprofloxacin course for his  urinary tract infection.   PLAN:  The patient remained stable for discharge home in the a.m., October 09, 2007, postop day #6.   Please see dictated discharge summary for followup appointments and  discharge instructions.   DISCHARGE MEDICATIONS:  1. Crestor 5 mg daily.  2. Lopressor 25 mg b.i.d.  3. Lisinopril 10 mg daily.  4. Aspirin 325 mg daily.  5. Lasix 40 mg daily times 5 days.  6. Potassium chloride 20 mEq daily times 5 days.  7. Amiodarone 400 mg b.i.d. times 14 days and 200 mg b.i.d.  8. Vicodin  5/325 1-2 tablets q. 4-6 hours p.r.n.      Theda Belfast, PA     KMD/MEDQ  D:  10/08/2007  T:  10/08/2007  Job:  [id number]   cc:   Jake Bathe, MD  Sheliah Plane, MD

## 2010-08-11 NOTE — Procedures (Signed)
NAME:  Aaron [name], Aaron [name] NO.:  [id number]   MEDICAL RECORD NO.:  [id number]          PATIENT TYPE:  OUT   LOCATION:  SLEEP CENTER                 FACILITY:  [facility]   PHYSICIAN:  Clinton D. Maple Hudson, MD, FCCP, FACPDATE OF BIRTH:  09/07/1950   DATE OF STUDY:  08/10/2007                            NOCTURNAL POLYSOMNOGRAM   REFERRING PHYSICIAN:  Thora Lance, M.D.   INDICATIONS FOR PROCEDURE:  Hypersomnia with sleep apnea.   RESULTS:  Epward sleepiness score 3/24. BMI 43, weight 320 pounds,  height 74.5 inches, neck 18.5 inches.   HOME MEDICATIONS:  Charted and reviewed.   A diagnostic study on Aug 01, 2007 at another facility had recorded an  AHI of 61.1 per hour. CPAP titration is requested.   SLEEP ARCHITECTURE:  Total sleep time 333 minutes with sleep efficiency  84.6%. Stage 1 was 10.2%; Stage 2, 81.6%; Stage 3 absent. REM 8.2% of  total sleep time. Sleep latency 6 minutes, REM latency 121 minutes.  Awake after sleep onset 54 minutes. Arousal index 23.7. No bedtime  medication was taken.   RESPIRATORY DATA:  CPAP titration protocol. The CPAP was titrated to 13  CWP, AHI zero per hour. He chose a medium ResMed Quattro mask with  heated humidifier. CPAP was well tolerated.   OXYGEN DATA:  Snoring was prevented by therapeutic CPAP with oxygen  saturation maintained at 96.6% on room air.   CARDIAC DATA:  Rare PAC. Otherwise, normal sinus rhythm.   MOVEMENT/PARASOMNIA:  Periodic limb movement with a total of 42 events  associated with arousals, index 7.6 per hour. The technician suggested  this was related more to complaint of hip pain than to disturbance by  CPAP.   IMPRESSION/RECOMMENDATIONS:  1. Successful CPAP titration of 13 CWP, AHI zero per hour. He chose a      medium Mirage Quattro mask with heated humidifier.  2. Diagnostic baseline NP SG on Aug 01, 2007 had recorded an AHI of      61.1 per hour.  3. Periodic limb movement syndrome with arousal 7.6  per hour.  4. Complaint of hip pain.      Clinton D. Maple Hudson, MD, [facility], FACP  Diplomate, Biomedical engineer of Sleep Medicine  Electronically Signed     CDY/MEDQ  D:  08/19/2007 20:41:25  T:  08/20/2007 00:06:30  Job:  [id number]

## 2010-08-11 NOTE — Procedures (Signed)
NAME:  MARIO, CORONADO NO.:  [id number]   MEDICAL RECORD NO.:  [id number]          PATIENT TYPE:  OUT   LOCATION:  SLEEP CENTER                 FACILITY:  [facility]   PHYSICIAN:  Clinton D. Maple Hudson, MD, FCCP, FACPDATE OF BIRTH:  March 21, 1951   DATE OF STUDY:  07/22/2008                        MAINTENANCE OF WAKEFULNESS TEST   REFERRING PHYSICIAN:  Thora Lance, M.D.   INDICATION FOR STUDY:  Hypersomnia with sleep apnea.  Evaluation for  residual hypersomnia.   EPWORTH SLEEPINESS SCORE:  Epworth sleepiness score 0/24.   BMI:  36.9.  Weight 295 pounds.  Height 6 feet 3 inches.  Neck 17.5  inches.   MEDICATIONS:  Home medications are charted and reviewed.   A previous diagnostic NPSG on Aug 01, 2007 recorded an AHI of 61.1 per  hour.  CPAP was subsequently titrated on Aug 10, 2007, 13 CWP, AHI 0 per  hour.  The patient wore his CPAP as usual, on the night before this  study.   NAP 1:  Test #1, at 9:40 a.m. sleep latency 40 minutes (no sleep).   NAP 2:  REM latency NA, test #2 at 11:40 a.m. sleep latency 40 minutes  (no sleep).   NAP 3:  REM latency NA, test #3 at 1340 p.m. sleep latency 40 minutes  (no sleep)   NAP 4:  REM latency NA, test #4 at 1540 p.m. sleep latency 40 minutes  (no sleep).   NAP 5:  REM latency NA.    MEAN SLEEP LATENCY:   COMMENTS:  The patient reports compliance with CPAP and used the night  before this test.  He maintained wakefulness through each session and  tolerated the study well.  Abnormal residual sleepiness was not  documented on the study.   IMPRESSIONS-RECOMMENDATIONS:  Mean sleep latency 40 minutes (no sleep  during any of the tested segments).  This patient demonstrated normal  wakefullness on this study without evidence of abnormal daytime  somnolence.      Clinton D. Maple Hudson, MD, [facility], FACP  Diplomate, Biomedical engineer of Sleep Medicine  Electronically Signed     CDY/MEDQ  D:  07/27/2008 12:13:40  T:  07/28/2008  03:10:40  Job:  [id number]

## 2010-08-11 NOTE — Assessment & Plan Note (Signed)
OFFICE VISIT   Aaron [name], Aaron [name]  DOB:  [birth year]                                        October 26, 2007  CHART #:  [id number]   The patient returns to the office today in followup visit after recent  coronary artery bypass grafting x5 done on October 03, 2007.  The patient  returns today with a chief complaint of fatigue.  He denies any  shortness of breath, denies chest pain, denies overt symptoms of  congestive heart failure.  He has had some difficulty sleeping, but  notes primarily fatigue.   On exam, his blood pressure 124/81, pulse is 69, respiratory rate 18,  and O2 sats 98%.  His sternum is stable and well healed.  His lungs are clear bilaterally.  His right endovein harvest site are  healing well.  He has no pedal edema.   Followup chest x-ray shows minimal pleural effusions with some  atelectatic changes in the left lower lobe but minor findings.   Overall, I am pleased with his progress.  He is currently frustrated now  that he is just 3 weeks postop that he does not have more energy to  return to full-time work at this point.  I have cautioned him that he  really needs 6-8 weeks to increase endurance before trying to return to  full-time work.  I have allowed him to return to driving.  I have  encouraged him to starting the cardiac rehab program next week.  I plan  to see him back in 3-4 weeks.   It should be noted on the patient's preop evaluation and previous labs  from his medical physician's office, his hemoglobin is noted to be  elevated.  Preoperatively it was 55.3 and hemoglobin of 19..  During his  preop evaluation his erythropoietin  levels were checked and were not  elevated.  The values are in the [facility] medical system.  His  carboxyhemoglobin level was also not increased.   He continues on Crestor 10 mg a day, Lopressor 25 b.i.d., lisinopril 20  a day, Norvasc 5 mg a day, aspirin 81 mg a day, hydrochlorothiazide 20  mg a day,  hydrocodone p.r.n.  He was discharged home on amiodarone, but  that has already been stopped.  He has had no recurrence of atrial  fibrillation.   Sheliah Plane, MD  Electronically Signed   EG/MEDQ  D:  10/26/2007  T:  10/27/2007  Job:  [id number]   cc:   Jake Bathe, MD  Thora Lance, M.D.

## 2010-08-11 NOTE — Assessment & Plan Note (Signed)
OFFICE VISIT   ARMOUR, VILLANUEVA  DOB:  30-May-1950                                        November 16, 2007  CHART #:  [id number]   The patient returns today in followup after coronary artery bypass  grafting x5 done on 10/03/2007.  Since last seen, the patient notes that  he feels better and overall less fatigued.  He attributes some of this  from his beta blocker being changed from Lopressor to [employer].  He,  however, still does have some muscle fatigue in his legs with exercise,  but does not seem to be claudication.  He has had no recurrent angina  and is increasing his physical activity without difficulty.   On exam, his blood pressure 119/81, pulse 66, respiratory rate is 18,  and O2 sats 94%.  His sternum is stable and well healed.  His lungs are  clear bilaterally.  His endovein harvest sites healing well.  Overall,  he seems to be making good progress.   He continues on Crestor 5 mg a day, lisinopril, Norvasc, aspirin,  hydrochlorothiazide, amlodipine, Bystolic, and AndroGel.  I did discuss  with him, if he continues to have muscle fatigue that it could be his  statin, but before stopping it, a discussion with Dr. Anne Fu as with his  coronary disease, it would be important to stay on his statins if  possible.  Overall, I am pleased with his progress.  I have not made him  a return appointment to see me, but would be glad to see him at his or  Dr. Anne Fu' request.   Sheliah Plane, MD  Electronically Signed   EG/MEDQ  D:  11/16/2007  T:  11/16/2007  Job:  [id number]   cc:   Thora Lance, M.D.  Jake Bathe, MD

## 2010-12-24 LAB — CBC
HCT: 38.8 — ABNORMAL LOW
HCT: 41.2
HCT: 42.5
HCT: 45.1
HCT: 46
HCT: 48.7
HCT: 51.7
Hemoglobin: 13.3
Hemoglobin: 14.5
Hemoglobin: 14.7
Hemoglobin: 15.4
Hemoglobin: 15.8
Hemoglobin: 16.6
Hemoglobin: 18.5 — ABNORMAL HIGH
MCHC: 34.1
MCHC: 34.2
MCHC: 34.3
MCHC: 34.4
MCHC: 34.5
MCHC: 35.2
MCHC: 35.7
MCV: 93.2
MCV: 93.4
MCV: 94.1
MCV: 94.2
MCV: 94.3
MCV: 94.8
MCV: 95.2
Platelets: 130 — ABNORMAL LOW
Platelets: 134 — ABNORMAL LOW
Platelets: 135 — ABNORMAL LOW
Platelets: 149 — ABNORMAL LOW
Platelets: 151
Platelets: 156
Platelets: 200
RBC: 4.07 — ABNORMAL LOW
RBC: 4.1 — ABNORMAL LOW
RBC: 4.34
RBC: 4.56
RBC: 4.78
RBC: 4.89
RBC: 5.17
RBC: 5.54
RDW: 12.7
RDW: 13.1
RDW: 13.3
RDW: 13.3
RDW: 13.4
RDW: 13.5
RDW: 13.5
WBC: 11.9 — ABNORMAL HIGH
WBC: 12.7 — ABNORMAL HIGH
WBC: 16.6 — ABNORMAL HIGH
WBC: 18.4 — ABNORMAL HIGH
WBC: 19.2 — ABNORMAL HIGH
WBC: 22.3 — ABNORMAL HIGH
WBC: 7.9
WBC: 8.2

## 2010-12-24 LAB — POCT I-STAT 4, (NA,K, GLUC, HGB,HCT)
Glucose, Bld: 102 — ABNORMAL HIGH
Glucose, Bld: 110 — ABNORMAL HIGH
Glucose, Bld: 111 — ABNORMAL HIGH
Glucose, Bld: 115 — ABNORMAL HIGH
Glucose, Bld: 143 — ABNORMAL HIGH
Glucose, Bld: 89
HCT: 33 — ABNORMAL LOW
HCT: 36 — ABNORMAL LOW
HCT: 36 — ABNORMAL LOW
HCT: 45
HCT: 47
HCT: 49
Hemoglobin: 11.2 — ABNORMAL LOW
Hemoglobin: 12.2 — ABNORMAL LOW
Hemoglobin: 12.2 — ABNORMAL LOW
Hemoglobin: 15.3
Hemoglobin: 16
Hemoglobin: 16.7
Operator id: 299391
Operator id: 3402
Operator id: 3402
Operator id: 3402
Operator id: 3402
Operator id: 3402
Potassium: 3.6
Potassium: 3.7
Potassium: 3.9
Potassium: 3.9
Potassium: 4
Potassium: 4
Sodium: 132 — ABNORMAL LOW
Sodium: 135
Sodium: 135
Sodium: 136
Sodium: 137
Sodium: 138

## 2010-12-24 LAB — URINALYSIS, ROUTINE W REFLEX MICROSCOPIC
Bilirubin Urine: NEGATIVE
Glucose, UA: NEGATIVE
Glucose, UA: NEGATIVE
Hgb urine dipstick: NEGATIVE
Ketones, ur: NEGATIVE
Ketones, ur: NEGATIVE
Nitrite: NEGATIVE
Nitrite: NEGATIVE
Protein, ur: 100 — AB
Protein, ur: NEGATIVE
Specific Gravity, Urine: 1.018
Specific Gravity, Urine: 1.038 — ABNORMAL HIGH
Urobilinogen, UA: 0.2
Urobilinogen, UA: 0.2
pH: 6
pH: 6

## 2010-12-24 LAB — POCT I-STAT, CHEM 8
BUN: 11
BUN: 14
Calcium, Ion: 1.12
Calcium, Ion: 1.17
Chloride: 103
Chloride: 105
Creatinine, Ser: 0.9
Creatinine, Ser: 1.4
Glucose, Bld: 131 — ABNORMAL HIGH
Glucose, Bld: 133 — ABNORMAL HIGH
HCT: 47
HCT: 48
Hemoglobin: 16
Hemoglobin: 16.3
Potassium: 4.1
Potassium: 4.4
Sodium: 137
Sodium: 138
TCO2: 21
TCO2: 23

## 2010-12-24 LAB — POCT I-STAT 3, ART BLOOD GAS (G3+)
Acid-Base Excess: 1
Acid-base deficit: 2
Acid-base deficit: 2
Acid-base deficit: 3 — ABNORMAL HIGH
Bicarbonate: 22
Bicarbonate: 22.2
Bicarbonate: 24.3 — ABNORMAL HIGH
Bicarbonate: 26.6 — ABNORMAL HIGH
O2 Saturation: 100
O2 Saturation: 88
O2 Saturation: 90
O2 Saturation: 95
Operator id: 203371
Operator id: 203371
Operator id: 280981
Operator id: 3402
Patient temperature: 37.3
Patient temperature: 38
TCO2: 23
TCO2: 23
TCO2: 26
TCO2: 28
pCO2 arterial: 36.6
pCO2 arterial: 37.7
pCO2 arterial: 43.6
pCO2 arterial: 48.2 — ABNORMAL HIGH
pH, Arterial: 7.313 — ABNORMAL LOW
pH, Arterial: 7.383
pH, Arterial: 7.386
pH, Arterial: 7.394
pO2, Arterial: 290 — ABNORMAL HIGH
pO2, Arterial: 59 — ABNORMAL LOW
pO2, Arterial: 60 — ABNORMAL LOW
pO2, Arterial: 78 — ABNORMAL LOW

## 2010-12-24 LAB — BASIC METABOLIC PANEL
BUN: 10
BUN: 12
BUN: 14
BUN: 17
BUN: 18
CO2: 23
CO2: 25
CO2: 26
CO2: 26
CO2: 29
Calcium: 7.9 — ABNORMAL LOW
Calcium: 8 — ABNORMAL LOW
Calcium: 8 — ABNORMAL LOW
Calcium: 8.2 — ABNORMAL LOW
Calcium: 8.4
Calcium: 8.5
Chloride: 100
Chloride: 101
Chloride: 105
Chloride: 107
Chloride: 108
Chloride: 109
Creatinine, Ser: 1.04
Creatinine, Ser: 1.08
Creatinine, Ser: 1.11
Creatinine, Ser: 1.3
Creatinine, Ser: 1.32
Creatinine, Ser: 1.46
GFR calc Af Amer: >60
GFR calc Af Amer: >60
GFR calc Af Amer: >60
GFR calc Af Amer: >60
GFR calc Af Amer: >60
GFR calc Af Amer: >60
GFR calc non Af Amer: 50 — ABNORMAL LOW
GFR calc non Af Amer: 56 — ABNORMAL LOW
GFR calc non Af Amer: 57 — ABNORMAL LOW
GFR calc non Af Amer: >60
GFR calc non Af Amer: >60
Glucose, Bld: 105 — ABNORMAL HIGH
Glucose, Bld: 113 — ABNORMAL HIGH
Glucose, Bld: 115 — ABNORMAL HIGH
Glucose, Bld: 121 — ABNORMAL HIGH
Glucose, Bld: 131 — ABNORMAL HIGH
Potassium: 3.9
Potassium: 3.9
Potassium: 4
Potassium: 4
Potassium: 4.2
Sodium: 134 — ABNORMAL LOW
Sodium: 136
Sodium: 136
Sodium: 138
Sodium: 139
Sodium: 139

## 2010-12-24 LAB — COMPREHENSIVE METABOLIC PANEL
ALT: 21
AST: 21
Albumin: 3.9
Alkaline Phosphatase: 48
BUN: 11
CO2: 20
Calcium: 9.4
Chloride: 103
Creatinine, Ser: 0.97
GFR calc Af Amer: >60
GFR calc non Af Amer: >60
Glucose, Bld: 86
Potassium: 4
Sodium: 136
Total Bilirubin: 1
Total Protein: 7

## 2010-12-24 LAB — TYPE AND SCREEN
ABO/RH(D): O NEG
Antibody Screen: NEGATIVE

## 2010-12-24 LAB — BLOOD GAS, ARTERIAL
Acid-Base Excess: 1.1
Bicarbonate: 24.9 — ABNORMAL HIGH
Drawn by: 206361
FIO2: 0.21
O2 Saturation: 97.4
Patient temperature: 98.6
TCO2: 26
pCO2 arterial: 37.8
pH, Arterial: 7.434
pO2, Arterial: 90.3

## 2010-12-24 LAB — URINE MICROSCOPIC-ADD ON

## 2010-12-24 LAB — CARBOXYHEMOGLOBIN
Carboxyhemoglobin: 0.7
Methemoglobin: 0.7
O2 Saturation: 99.6
Total hemoglobin: 15.7

## 2010-12-24 LAB — CREATININE, SERUM
Creatinine, Ser: 1.04
GFR calc Af Amer: >60
GFR calc non Af Amer: >60

## 2010-12-24 LAB — CK TOTAL AND CKMB (NOT AT ARMC)
CK, MB: 10 — ABNORMAL HIGH
Relative Index: 0.7
Total CK: 1495 — ABNORMAL HIGH

## 2010-12-24 LAB — PROTIME-INR
INR: 0.9
INR: 1.2
Prothrombin Time: 12.7
Prothrombin Time: 15.5 — ABNORMAL HIGH

## 2010-12-24 LAB — APTT
aPTT: 32
aPTT: 34

## 2010-12-24 LAB — PREALBUMIN: Prealbumin: 20.9

## 2010-12-24 LAB — HEMOGLOBIN A1C
Hgb A1c MFr Bld: 5.3
Mean Plasma Glucose: 111

## 2010-12-24 LAB — HEMOGLOBIN AND HEMATOCRIT, BLOOD
HCT: 33.3 — ABNORMAL LOW
Hemoglobin: 11.5 — ABNORMAL LOW

## 2010-12-24 LAB — URINE CULTURE
Colony Count: NO GROWTH
Culture: NO GROWTH

## 2010-12-24 LAB — ERYTHROPOIETIN: Erythropoietin: 25

## 2010-12-24 LAB — ABO/RH: ABO/RH(D): O NEG

## 2010-12-24 LAB — MAGNESIUM
Magnesium: 2.2
Magnesium: 2.6 — ABNORMAL HIGH

## 2010-12-24 LAB — PLATELET COUNT: Platelets: 138 — ABNORMAL LOW

## 2010-12-24 LAB — FERRITIN: Ferritin: 113 (ref 22–322)

## 2013-08-16 ENCOUNTER — Telehealth: Payer: Self-pay | Admitting: Cardiology

## 2013-08-16 NOTE — Telephone Encounter (Signed)
New message     Patient is asking for sample of bystoic.    Patient moving to [place] is looking for upcoming appt before he leaves.

## 2013-08-17 ENCOUNTER — Telehealth: Payer: Self-pay | Admitting: *Deleted

## 2013-08-17 NOTE — Telephone Encounter (Signed)
Will get samples when patient show up today .

## 2013-08-17 NOTE — Telephone Encounter (Signed)
Patient came to office for samples of bystolic samples. Bystolic 10mg  samples provided.

## 2013-08-17 NOTE — Telephone Encounter (Signed)
Spoke with patient patient is scheduled to come into the office.

## 2013-08-22 ENCOUNTER — Ambulatory Visit (INDEPENDENT_AMBULATORY_CARE_PROVIDER_SITE_OTHER): Payer: BC Managed Care – PPO | Admitting: Cardiology

## 2013-08-22 ENCOUNTER — Encounter: Payer: Self-pay | Admitting: Cardiology

## 2013-08-22 VITALS — BP 110/70 | HR 68 | Ht 74.0 in | Wt 330.0 lb

## 2013-08-22 DIAGNOSIS — I2510 Atherosclerotic heart disease of native coronary artery without angina pectoris: Secondary | ICD-10-CM

## 2013-08-22 DIAGNOSIS — I10 Essential (primary) hypertension: Secondary | ICD-10-CM

## 2013-08-22 DIAGNOSIS — Z951 Presence of aortocoronary bypass graft: Secondary | ICD-10-CM

## 2013-08-22 DIAGNOSIS — E7800 Pure hypercholesterolemia, unspecified: Secondary | ICD-10-CM

## 2013-08-22 NOTE — Progress Notes (Signed)
[place]. [address], [address], [place]  [address] Phone: [phone] Fax:  [phone]  Date:  08/22/2013   ID:  Aaron [name], DOB 1950/10/27, MRN [id number]  PCP:  No primary provider on file.   History of Present Illness: Aaron [name] is a 63 y.o. male status post coronary artery bypass grafting 09/2007 with hyperlipidemia, obesity, HTN. He is an ex Chemical engineer.  .  Currently doing well without any difficulty. No angina, no syncope, no bleeding, no shortness of breath. Enjoying his time in [place]. Currently, scuba diving. Moving to [place] but wants to continue to come here to see [place].   Continuing to work on his weight previously attending weight-loss program in [place] better. His blood pressure is reduced.   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 Excellent job. 9 minutes. Normal blood pressure response.  He recounted his poor overall experience at [facility] during CABG (bed, nausea). Very happy though with Dr. Servando [name] and myself he states. He had a much better general experience at [facility] during anterior approach hip resection. I expressed my sympathy and discussed the culture change/ improvement at [facility] as well as exceptional ratings ([place] [employer]) with CV care.   Wt Readings from Last 3 Encounters:  08/22/13 330 lb (149.687 kg)     No past medical history on file. HTN HL No past surgical history on file. CABG 2009 Aaron [name]) Anterior hip replacement Aaron [name])    Current Outpatient Prescriptions  Medication Sig Dispense Refill  . amLODipine (NORVASC) 5 MG tablet Take 1 tablet by mouth daily.      Marland Kitchen aspirin 325 MG tablet Take 325 mg by mouth daily.      Marland Kitchen atorvastatin (LIPITOR) 40 MG tablet Take 40 mg by mouth daily.      Marland Kitchen BYSTOLIC 10 MG tablet Take 1 tablet by mouth daily.      . chlorthalidone (HYGROTON) 25 MG tablet Take 1 tablet by mouth daily.      . Coenzyme Q10 (COQ-10 PO) Take 1 tablet by mouth daily.      Marland Kitchen  lisinopril (PRINIVIL,ZESTRIL) 20 MG tablet Take 1 tablet by mouth daily.      . potassium chloride (K-DUR) 10 MEQ tablet Take 1 tablet by mouth daily.       No current facility-administered medications for this visit.    Allergies:   Allergies not on file  Social History:  The patient  reports that he has quit smoking. He does not have any smokeless tobacco history on file. He reports that he does not drink alcohol or use illicit drugs.   No family history on file.  ROS:  Please see the history of present illness.   No CP, no SOB, no syncope, no bleeding, no orthopnea.    All other systems reviewed and negative.   PHYSICAL EXAM: VS:  BP 110/70  Pulse 68  Ht 6\' 2"  (1.88 m)  Wt 330 lb (149.687 kg)  BMI 42.35 kg/m2 Well nourished, well developed, in no acute distress HEENT: normal, [place]/AT, EOMI Neck: no JVD, normal carotid upstroke, no bruit Cardiac:  normal S1, S2; RRR; no murmurchest scar Lungs:  clear to auscultation bilaterally, no wheezing, rhonchi or rales Abd: soft, nontender, no hepatomegaly, no bruitsoverweight Ext: no edema, 2+ distal pulses Skin: warm and dry GU: deferred Neuro: no focal abnormalities noted, AAO x 3  EKG: None today.  ASSESSMENT AND PLAN:  1. CAD - CABG.  Pain free, no angina. Secondary prevention. Doing well. Next June 2016, [place] ETT.  2. Obesity - encourage weight loss. Low carb diet.  3. Hyperlipidemia - LDL goal 70. Last check by Aaron [name] 74. He will be checking soon. Atorvastatin 4. HTN - well controlled on current meds, reviewed. Bb, Ace.  5. See back in one year.   Signed, Aaron Furbish, MD [facility]  08/22/2013 4:37 PM

## 2013-08-22 NOTE — Patient Instructions (Signed)
Your physician recommends that you continue on your current medications as directed. Please refer to the Current Medication list given to you today.  Your physician wants you to follow-up in: 1 year. You will receive a reminder letter in the mail two months in advance. If you don't receive a letter, please call our office to schedule the follow-up appointment.  

## 2013-08-23 ENCOUNTER — Encounter: Payer: Self-pay | Admitting: Cardiology

## 2013-08-23 DIAGNOSIS — E7800 Pure hypercholesterolemia, unspecified: Secondary | ICD-10-CM | POA: Insufficient documentation

## 2013-08-23 DIAGNOSIS — I10 Essential (primary) hypertension: Secondary | ICD-10-CM | POA: Insufficient documentation

## 2013-08-23 DIAGNOSIS — I2510 Atherosclerotic heart disease of native coronary artery without angina pectoris: Secondary | ICD-10-CM | POA: Insufficient documentation

## 2013-08-23 DIAGNOSIS — Z951 Presence of aortocoronary bypass graft: Secondary | ICD-10-CM | POA: Insufficient documentation

## 2013-09-06 ENCOUNTER — Telehealth: Payer: Self-pay

## 2013-09-06 NOTE — Telephone Encounter (Signed)
Patient requesting samples of bystolic 10 mg

## 2013-12-14 ENCOUNTER — Telehealth: Payer: Self-pay | Admitting: Cardiology

## 2013-12-14 DIAGNOSIS — I10 Essential (primary) hypertension: Secondary | ICD-10-CM

## 2013-12-14 DIAGNOSIS — I25810 Atherosclerosis of coronary artery bypass graft(s) without angina pectoris: Secondary | ICD-10-CM

## 2013-12-14 NOTE — Telephone Encounter (Signed)
New Prob  Pt is inquiring about having a stress test done. Requesting to speak to a nurse.

## 2013-12-19 NOTE — Telephone Encounter (Signed)
New problem// Follow up   Pt called to follow up on stress test inquiry. Please call

## 2013-12-19 NOTE — Telephone Encounter (Signed)
I attempted to call the phone # given [phone] unable to connect. I called   Home  # [phone]. Left a message to call back.

## 2013-12-20 NOTE — Telephone Encounter (Signed)
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  He is with his 64 yr old mother who has a broken hip and will call back to schedule.  Pt aware order will be place and he can call back to the main number to schedule when he is ready

## 2014-01-30 ENCOUNTER — Telehealth: Payer: Self-pay | Admitting: Cardiology

## 2014-01-30 NOTE — Telephone Encounter (Signed)
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 He had questions about bystolic and how long to hold it for a stress test, pt was encouraged to make appointment and the test requirements would be reviewed. Questions where asked and answered. Pt will call back to arrange.

## 2014-01-30 NOTE — Telephone Encounter (Signed)
New message     Have questions about his medications

## 2014-02-01 ENCOUNTER — Telehealth: Payer: Self-pay | Admitting: Cardiology

## 2014-02-01 NOTE — Telephone Encounter (Signed)
Pt calling c/o increased fatigue and requesting an appt.  He denies any increased SOB (other than being on the GXT) and no cp.  appt scheduled for 11/12 at 2 pm with Dr Marlou Porch.

## 2014-02-01 NOTE — Telephone Encounter (Signed)
New message  Pt request a call back to discuss having a sooner Stress test// the next test is in December.. Pt requests a sooner appt within the next week for a GXT

## 2014-02-01 NOTE — Telephone Encounter (Signed)
Follow up    Patient calling regarding previous message .   Explain to patient of the order of ETT  In the system and no current apptoiment set up.    Patient stating this is a mis-communication would like the nurse to call him back .

## 2014-02-05 ENCOUNTER — Telehealth: Payer: Self-pay | Admitting: Cardiology

## 2014-02-05 NOTE — Telephone Encounter (Signed)
New message  Pt would like a cardiologist in [place] area.

## 2014-02-07 ENCOUNTER — Ambulatory Visit: Payer: BC Managed Care – PPO | Admitting: Cardiology

## 2014-02-14 ENCOUNTER — Encounter: Payer: Self-pay | Admitting: Cardiology

## 2014-02-26 NOTE — Telephone Encounter (Signed)
I apologize, I do not have a recommendation. Candee Furbish, MD

## 2014-02-26 NOTE — Telephone Encounter (Signed)
Will ask Dr Skains and call pt back with recommendation.  

## 2014-02-27 NOTE — Telephone Encounter (Signed)
Spoke with pt.  Apologized for the delay in response - pt was very understanding.  He would like to continue to see Dr Marlou Porch if possible.

## 2014-03-27 ENCOUNTER — Telehealth: Payer: Self-pay | Admitting: Cardiology

## 2014-03-27 NOTE — Telephone Encounter (Signed)
OK to stop amlodipine.  Keep me posted on any changes.

## 2014-03-27 NOTE — Telephone Encounter (Signed)
Pt aware and will stop amlodipine.  He will keep a check on his BP and call back with any issues.

## 2014-03-27 NOTE — Telephone Encounter (Signed)
Per pt call states he is down 20 lbs and BP is staying about 106/63.  He feels OK but would like to decrease one of his meds if possible.  Reviewed medications on his lists and he reports he is taking as listed.  Advised I will review with Dr Marlou Porch and call him back with any orders.

## 2014-03-27 NOTE — Telephone Encounter (Signed)
New message  Pt called states that he wanted to stop taking a few of his medications.. request a call back to discuss which ones he can stop taking.. because his BP is rather low--106/63--loosing stamina and weight.

## 2014-03-29 HISTORY — PX: COLONOSCOPY: SHX174

## 2014-06-10 ENCOUNTER — Encounter: Payer: Self-pay | Admitting: Cardiology

## 2014-06-10 ENCOUNTER — Ambulatory Visit (INDEPENDENT_AMBULATORY_CARE_PROVIDER_SITE_OTHER): Payer: Medicare Other | Admitting: Cardiology

## 2014-06-10 VITALS — BP 124/60 | HR 78 | Ht 74.0 in | Wt 326.0 lb

## 2014-06-10 DIAGNOSIS — I2510 Atherosclerotic heart disease of native coronary artery without angina pectoris: Secondary | ICD-10-CM

## 2014-06-10 DIAGNOSIS — I10 Essential (primary) hypertension: Secondary | ICD-10-CM

## 2014-06-10 DIAGNOSIS — E7800 Pure hypercholesterolemia, unspecified: Secondary | ICD-10-CM

## 2014-06-10 DIAGNOSIS — Z951 Presence of aortocoronary bypass graft: Secondary | ICD-10-CM

## 2014-06-10 MED ORDER — NEBIVOLOL HCL 10 MG PO TABS: 10.0000 mg | ORAL_TABLET | Freq: Every day | ORAL | Status: DC

## 2014-06-10 NOTE — Addendum Note (Signed)
Addended by: Shellia Cleverly on: 06/10/2014 10:35 AM   Modules accepted: Orders

## 2014-06-10 NOTE — Patient Instructions (Signed)
The current medical regimen is effective;  continue present plan and medications.  Please consider having a Art gallery manager.  Please call back once you are ready to schedule. [phone].  Follow up in 1 year with Dr. Marlou Porch.  You will receive a letter in the mail 2 months before you are due.  Please call us when you receive this letter to schedule your follow up appointment.  Thank you for choosing [facility]!!

## 2014-06-10 NOTE — Progress Notes (Signed)
[facility] [address], [address] [place], [place]  [address] Phone: [phone] Fax:  [phone]  Date:  06/10/2014   ID:  Aaron [name], DOB [birth year], MRN [id number]  PCP:  No primary care provider on file.   History of Present Illness: Aaron [name] is a 64 y.o. male status post coronary artery bypass grafting 09/2007 with hyperlipidemia, obesity, HTN. He is an ex Chemical engineer.   Feels some central tightness when stressed. No SOB. Tired after treadmill. More tired, different from prior to CABG. 1 hour in pool. Doing well. Scuba doing well. [place].    Currently doing well without any difficulty. No angina, no syncope, no bleeding, no shortness of breath. Enjoying his time in [place]. Currently, scuba diving. Moving to [place] but wants to continue to come here to see [place].   Through telephone conversation, I stopped amlodipine December 2015. Blood pressure was staying around 106/63.  Continuing to work on his weight previously attending weight-loss program in [place] better. His blood pressure is reduced.   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 Excellent job. 9 minutes. Normal blood pressure response.  He recounted his poor overall experience at [facility] hospital during CABG (bed, nausea). Very happy though with Dr. [name] and myself he states. He had a much better general experience at [facility] during anterior approach hip resection. I expressed my sympathy and discussed the culture change/ improvement at [facility] as well as exceptional ratings ([place] [employer]) with CV care.   Wt Readings from Last 3 Encounters:  06/10/14 326 lb (147.873 kg)  08/22/13 330 lb (149.687 kg)     Past Medical History  Diagnosis Date  . Hyperlipidemia   . Coronary artery disease   . Hypertension     Past Surgical History  Procedure Laterality Date  . Coronary artery bypass graft     CABG 2009 [name]) Anterior hip replacement Mikel Cella)    Current Outpatient  Prescriptions  Medication Sig Dispense Refill  . amLODipine (NORVASC) 5 MG tablet Take 1 tablet by mouth daily.    Marland Kitchen aspirin 325 MG tablet Take 325 mg by mouth daily.    Marland Kitchen atorvastatin (LIPITOR) 40 MG tablet Take 40 mg by mouth daily.    Marland Kitchen BYSTOLIC 10 MG tablet Take 1 tablet by mouth daily.    . chlorthalidone (HYGROTON) 25 MG tablet Take 1 tablet by mouth daily.    . Coenzyme Q10 (COQ-10 PO) Take 1 tablet by mouth daily.    Marland Kitchen lisinopril (PRINIVIL,ZESTRIL) 20 MG tablet Take 1 tablet by mouth daily.    . potassium chloride (K-DUR) 10 MEQ tablet Take 1 tablet by mouth daily.     No current facility-administered medications for this visit.    Allergies:   Not on File  Social History:  The patient  reports that he has quit smoking. He does not have any smokeless tobacco history on file. He reports that he does not drink alcohol or use illicit drugs.   Family History  Problem Relation Age of Onset  . Hypertension Father     ROS:  Please see the history of present illness.   No CP, no SOB, no syncope, no bleeding, no orthopnea.    All other systems reviewed and negative.   PHYSICAL EXAM: VS:  BP 124/60 mmHg  Pulse 78  Ht 6\' 2"  (1.88 m)  Wt 326 lb (147.873 kg)  BMI 41.84 kg/m2  SpO2 98% Well nourished, well developed, in  no acute distress HEENT: normal, [place]/AT, EOMI Neck: no JVD, normal carotid upstroke, no bruit Cardiac:  normal S1, S2; RRR; no murmurchest scar Lungs:  clear to auscultation bilaterally, no wheezing, rhonchi or rales Abd: soft, nontender, no hepatomegaly, no bruitsoverweight Ext: no edema, 2+ distal pulses Skin: warm and dry GU: deferred Neuro: no focal abnormalities noted, AAO x 3  EKG: None today.  ASSESSMENT AND PLAN:  1. CAD - CABG. has felt atypical sensation, awareness of heart. Does not seem to be exertional. He is swimming, treadmill. He does feel some deconditioning. We discussed the possibility of nuclear stress test. He is checking into the cost.  Secondary prevention. June 2016, [place] ETT.  2. Obesity - encourage weight loss. Low carb diet. Likely playing a role in deconditioning. Taking care of his sleep apnea. 3. Hyperlipidemia - LDL goal 70. Last check by [name] 76. He will be checking soon. Atorvastatin 4. HTN - well controlled on current meds, reviewed. Bb, Ace.  5. See back in one year.   Signed, Candee Furbish, MD [facility]  06/10/2014 9:36 AM

## 2014-06-17 ENCOUNTER — Ambulatory Visit: Payer: Self-pay | Admitting: Cardiology

## 2014-06-19 ENCOUNTER — Other Ambulatory Visit: Payer: Self-pay | Admitting: *Deleted

## 2014-06-19 ENCOUNTER — Telehealth: Payer: Self-pay | Admitting: Cardiology

## 2014-06-19 DIAGNOSIS — R0789 Other chest pain: Secondary | ICD-10-CM

## 2014-06-19 NOTE — Telephone Encounter (Signed)
Routed to Kelli Churn, RN

## 2014-06-19 NOTE — Telephone Encounter (Signed)
New message      Pt is having a nuclear stress test tues 06-25-14.  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  He want to make sure someone reads the nuclear stress test ASAP so that he can let the people doing the check up the next day the results.  He lives in [place] and need to get all of this done while here.  Pt is aware Pam is out today, he want her to get this message when she returns.

## 2014-06-22 ENCOUNTER — Telehealth: Payer: Self-pay | Admitting: Physician Assistant

## 2014-06-22 NOTE — Telephone Encounter (Signed)
     Patient wanted to know if he had to hold medications before nuclear stress test on Tuesday. Since it is a lexiscan he does not have to hold any meds. He knows not to eat or drink after midnight the night before.   Angelena Form PA-C  [facility]

## 2014-06-24 NOTE — Telephone Encounter (Signed)
Pt called back to the office stating he has never been given instructions for his [place] scheduled for tomorrow.  Documentation is noted from 3/26 where instructions where reviewed with the pt.  I reviewed the instructions with the patient who states understanding.

## 2014-06-25 ENCOUNTER — Ambulatory Visit (HOSPITAL_COMMUNITY): Payer: Medicare Other | Attending: Cardiology | Admitting: Radiology

## 2014-06-25 DIAGNOSIS — E669 Obesity, unspecified: Secondary | ICD-10-CM | POA: Diagnosis not present

## 2014-06-25 DIAGNOSIS — I10 Essential (primary) hypertension: Secondary | ICD-10-CM | POA: Insufficient documentation

## 2014-06-25 DIAGNOSIS — R0789 Other chest pain: Secondary | ICD-10-CM

## 2014-06-25 DIAGNOSIS — R079 Chest pain, unspecified: Secondary | ICD-10-CM | POA: Diagnosis not present

## 2014-06-25 MED ORDER — TECHNETIUM TC 99M SESTAMIBI GENERIC - CARDIOLITE
30.0000 | Freq: Once | INTRAVENOUS | Status: AC | PRN
  Administered 2014-06-25: 30 via INTRAVENOUS

## 2014-06-25 MED ORDER — REGADENOSON 0.4 MG/5ML IV SOLN
0.4000 mg | Freq: Once | INTRAVENOUS | Status: AC
  Administered 2014-06-25: 0.4 mg via INTRAVENOUS

## 2014-06-25 NOTE — Progress Notes (Signed)
[facility] [address] [place], [place] [address] [phone]    Cardiology Nuclear Med Study  Aaron [name] is a 64 y.o. male     MRN : [id number]     DOB: 12/05/1950  Procedure Date: 06/25/2014  Nuclear Med Background Indication for Stress Test:  Evaluation for Ischemia and Surgical Clearance:FAA clearance History:  CAD, MPI 2009 (ischemia) EF 50%, Afib post CABG Cardiac Risk Factors: Carotid Disease, Hypertension and Obesity  Symptoms:  Chest Pain   Nuclear Pre-Procedure Caffeine/Decaff Intake:  None NPO After: 1:30am   Lungs:  clear O2 Sat: 97% on room air. IV 0.9% NS with Angio Cath:  22g  IV Site: R Hand  IV Started by:  Matilde Haymaker, RN  Chest Size (in):  54 Cup Size: n/a  Height: 6\' 2"  (1.88 m)  Weight:  329 lb (149.233 kg)  BMI:  Body mass index is 42.22 kg/(m^2). Tech Comments:  Bystolic taken at 2130    Nuclear Med Study 1 or 2 day study: 2 day  Stress Test Type:  Carlton Adam  Reading MD: n/a  Order Authorizing Provider:  Wallene Huh  Resting Radionuclide: Technetium 60m Sestamibi  Resting Radionuclide Dose: 33.0 mCi on 06/26/2014  Stress Radionuclide:  Technetium 82m Sestamibi  Stress Radionuclide Dose: 33.0 mCi on 06/25/2014          Stress Protocol Rest HR: 58 Stress HR: 74  Rest BP: 122/74 Stress BP: 128/60  Exercise Time (min): n/a METS: n/a   Predicted Max HR: 155 bpm % Max HR: 47.74 bpm Rate Pressure Product: 9768   Dose of Adenosine (mg):  n/a Dose of Lexiscan: 0.4 mg  Dose of Atropine (mg): n/a Dose of Dobutamine: n/a mcg/kg/min (at max HR)  Stress Test Technologist: Glade Lloyd, BS-ES  Nuclear Technologist:  Earl Many, CNMT     Rest Procedure:  Myocardial perfusion imaging was performed at rest 45 minutes following the intravenous administration of Technetium 80m Sestamibi. Rest ECG: Sinus bradycardia rate 58 with no other abnormalities  Stress Procedure:  The patient received IV Lexiscan 0.4  mg over 15-seconds.  Technetium 49m Sestamibi injected at 30-seconds.  Quantitative spect images were obtained after a 45 minute delay.  During the infusion of Lexiscan the patient complained of SOB that resolved in recovery.  Stress ECG: No significant change from baseline ECG  QPS Raw Data Images:  Normal; no motion artifact; normal heart/lung ratio. diaphragmatic attenuation noted. Stress Images:  There is basal inferior decreased uptake seen at both rest and stress consistent with old scar, no peri-infarct ischemia noted. Otherwise, homogeneous radiotracer uptake at both rest and stress. Apical thinning noted. Defect is small in size and moderate in intensity. There is diaphragmatic attenuation noted on raw images which may be contributing to the basal inferior decreased uptake pattern. Rest Images:  As Above Subtraction (SDS):  No evidence of ischemia. Transient Ischemic Dilatation (Normal <1.22):  1.03 Lung/Heart Ratio (Normal <0.45):  0.39  Quantitative Gated Spect Images QGS EDV:  162 ml QGS ESV:  71 ml  Impression Exercise Capacity:  Lexiscan with no exercise. BP Response:  Normal blood pressure response. Clinical Symptoms:  No significant symptoms noted. ECG Impression:  No significant ST segment change suggestive of ischemia. Comparison with Prior Nuclear Study: No images to compare  Overall Impression:  Low risk stress nuclear study with no evidence of ischemia identified. Basilar inferior infarct pattern with no ischemia noted. Post bypass. Diaphragmatic attenuation noted may be contributing  to basal inferior pattern as noted above..  LV Ejection Fraction: 56%.  LV Wall Motion:  NL LV Function; NL Wall Motion with postop septal wall motion changes. Overall reassuring stress test.   Candee Furbish, MD

## 2014-06-26 MED ORDER — TECHNETIUM TC 99M SESTAMIBI GENERIC - CARDIOLITE
30.0000 | Freq: Once | INTRAVENOUS | Status: AC | PRN
  Administered 2014-06-26: 30 via INTRAVENOUS

## 2014-06-26 NOTE — Telephone Encounter (Signed)
Reviewed results of stress test with patient who states understanding.

## 2014-06-28 ENCOUNTER — Encounter (HOSPITAL_COMMUNITY): Payer: Medicare Other

## 2015-02-21 ENCOUNTER — Ambulatory Visit
Admit: 2015-02-21 | Discharge: 2015-02-21 | Payer: PRIVATE HEALTH INSURANCE | Attending: Family Medicine | Primary: Family Medicine

## 2015-02-21 ENCOUNTER — Encounter: Attending: Family Medicine | Primary: Family Medicine

## 2015-02-21 DIAGNOSIS — E785 Hyperlipidemia, unspecified: Secondary | ICD-10-CM | POA: Insufficient documentation

## 2015-02-21 DIAGNOSIS — G4733 Obstructive sleep apnea (adult) (pediatric): Secondary | ICD-10-CM | POA: Insufficient documentation

## 2015-02-21 DIAGNOSIS — I10 Essential (primary) hypertension: Secondary | ICD-10-CM

## 2015-02-21 NOTE — Progress Notes (Signed)
Progress Note    Patient: Elijah [name] MRN: [id number]  SSN: [id number]    Date of Birth: 06/04/1950  Age: 64 y.o.  Sex: male        Chief Complaint   Patient presents with   ??? Physical         Subjective:     Encounter Diagnoses   Name Primary?   ??? Essential hypertension: Is returning to my practice after years of absence.  BP Readings from Last 3 Encounters:   02/21/15 117/75     The patient reports:  taking medications as instructed, no medication side effects noted, no TIA's, no chest pain on exertion, no swelling of ankles.     Our goal is to normalize the blood pressure to decrease the risks of strokes and heart attacks. The patient is in agreement with the plan.   Yes   ??? Pure hypercholesterolemia:  Cardiovascular risks for him are: LDL goal is under 80  existing CAD  hypertension  hyperlipidemia.   Currently he takes Lipitor (atorvastatin) , 40 mg    The patient is aware of our goal to reduce or eliminate the long term problems (such as strokes and heart attacks) related to poorly controlled hyperlipidemia.      ??? 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      ??? Coronary artery disease involving native coronary artery of native heart without angina pectoris:He had a five way bypass in 2009. He denies exertional chest pain. His presenting complaint for his heart disease was shortness of breath.He recently lost his older brother to acute MI.      ??? S/P hip replacement, bilateral: His hips will begin to hurt after long walk but in general they are performing adequately.      ??? OSA on CPAP:He has significant sleep apnea and is not sleep well at all without his machine. Because he travels he needs a new machine. I have consult of [facility] for this.              Current and past medical information:    Current Medications after this visit::   Current Outpatient Prescriptions   Medication Sig   ??? lisinopril (PRINIVIL, ZESTRIL) 20 mg tablet Take  by mouth daily.    ??? nebivolol (BYSTOLIC) 10 mg tablet Take  by mouth daily.   ??? potassium chloride SR (KLOR-CON 10) 10 mEq tablet Take  by mouth.   ??? atorvastatin (LIPITOR) 40 mg tablet Take  by mouth daily.   ??? aspirin (ASPIRIN) 325 mg tablet Take 325 mg by mouth daily.   ??? DOCOSAHEXANOIC ACID/EPA (FISH OIL PO) Take  by mouth.   ??? Flaxseed Oil oil by Does Not Apply route.     No current facility-administered medications for this visit.        Patient Active Problem List    Diagnosis Date Noted   ??? Neuropathy 02/21/2015   ??? Essential hypertension 02/21/2015   ??? HLD (hyperlipidemia) 02/21/2015   ??? CAD (coronary artery disease) 02/21/2015   ??? OSA on CPAP 02/21/2015       No past medical history on file.    Allergies   Allergen Reactions   ??? Metoprolol Other (comments)     Patient states, made me feel horrible.       Past Surgical History   Procedure Laterality Date   ??? Hx hip replacement Bilateral        Social History     Social  History   ??? Marital status: MARRIED     Spouse name: N/A   ??? Number of children: N/A   ??? Years of education: N/A     Social History Main Topics   ??? Smoking status: Former Smoker     Types: Cigarettes   ??? Smokeless tobacco: None   ??? Alcohol use None   ??? Drug use: None   ??? Sexual activity: Not Asked     Other Topics Concern   ??? None     Social History Narrative       Review of Systems   Constitutional: Negative.  Negative for chills, fever, malaise/fatigue and weight loss.        Delightful person who provides a detailed accurate history.     HENT: Negative.  Negative for hearing loss.    Eyes: Negative.  Negative for blurred vision and double vision.   Respiratory: Negative.  Negative for cough, hemoptysis, sputum production and shortness of breath.    Cardiovascular: Negative.  Negative for chest pain, palpitations and orthopnea.   Gastrointestinal: Negative.  Negative for abdominal pain, blood in stool, heartburn, nausea and vomiting.   Genitourinary: Negative.  Negative for dysuria, frequency and urgency.    Musculoskeletal: Negative.  Negative for back pain, myalgias and neck pain.   Skin: Negative.  Negative for rash.   Neurological: Positive for sensory change. Negative for dizziness, tingling, tremors, weakness and headaches.        He has a significant neuropathy of his feet.   Endo/Heme/Allergies: Negative.    Psychiatric/Behavioral: Negative.  Negative for depression.        He has been grieving the loss of his brother and son recently. It is been a tough time for him.     Objective:     Vitals:    02/21/15 0859   BP: 117/75   Pulse: 68   Resp: 20   Temp: 97.9 ??F (36.6 ??C)   TempSrc: Oral   SpO2: 95%   Weight: 337 lb 3.2 oz (153 kg)   Height:  (1.905 m)      Body mass index is 42.15 kg/(m^2).    Physical Exam   Constitutional: He is oriented to person, place, and time and well-developed, well-nourished, and in no distress.   HENT:   Head: Normocephalic and atraumatic.   Mouth/Throat: Oropharynx is clear and moist.   Eyes: Right eye exhibits no discharge. Left eye exhibits no discharge. No scleral icterus.   Neck: No tracheal deviation present. No thyromegaly present.   No bruit.   Cardiovascular: Normal rate, regular rhythm and normal heart sounds.    No murmur heard.  Pulmonary/Chest: Effort normal and breath sounds normal.   Abdominal: Soft.   Neurological: He is alert and oriented to person, place, and time.   Skin: No rash noted. No erythema.   Psychiatric: Mood and affect normal.   Nursing note and vitals reviewed.        Health Maintenance Duebecause he gets much of his healthcare through the [place] system I do not have any of these records. He will try to track them down for me.   Topic Date Due   ??? Hepatitis C Screening  July 05, 1950   ??? DTaP/Tdap/Td series (1 - Tdap) 12/21/1969   ??? FOBT Q 1 YEAR AGE 76-75  12/22/1998   ??? GLAUCOMA SCREENING Q2Y  12/21/2013   ??? Pneumococcal 65+ Low/Medium Risk (1 of 2 - PCV13) 12/21/2013   ??? MEDICARE YEARLY EXAM  12/21/2013       Assessment and orders:       ICD-10-CM     1. Essential hypertension-this problem is controlled on his current medication-he will need carotid Doppler's. I10 lisinopril (PRINIVIL, ZESTRIL) 20 mg tablet     nebivolol (BYSTOLIC) 10 mg tablet     potassium chloride SR (KLOR-CON 10) 10 mEq tablet     atorvastatin (LIPITOR) 40 mg tablet     DOCOSAHEXANOIC ACID/EPA (FISH OIL PO)     NMR LIPOPROFILE     METABOLIC PANEL, COMPREHENSIVE     CBC WITH AUTOMATED DIFF   2. Pure hypercholesterolemia:have asked him to do it and am our profile early onset coronary artery disease. This would give me a better idea if his Lipitor needs to be increased. E78.00 atorvastatin (LIPITOR) 40 mg tablet     DOCOSAHEXANOIC ACID/EPA (FISH OIL PO)     Flaxseed Oil oil     NMR LIPOPROFILE     METABOLIC PANEL, COMPREHENSIVE     DUPLEX CAROTID BILATERAL   3. Neuropathy-significant discomfort, familial and untreated at this point. He will have to review the permissible medications through the FAA. My thoughts would be gabapentin or Lyrica.   G62.9    4. Coronary artery disease involving native coronary artery of native heart without angina pectoris.  I'll have them him establish are with Dr. Gasper LloydSabharwal since you will be spending a lot of his time with his mother 10 miles outside of town. I25.10 atorvastatin (LIPITOR) 40 mg tablet     aspirin (ASPIRIN) 325 mg tablet     DOCOSAHEXANOIC ACID/EPA (FISH OIL PO)     REFERRAL TO CARDIOLOGY     DUPLEX CAROTID BILATERAL   5. S/P hip replacement, bilateral-acceptable performance   Z96.643    6. OSA on CPAP-continue CPAP at his current setting. Will try to get a new machine through [facility] [place] . G47.33          Plan of care:  Discussed diagnoses in detail with patient.     Medication risks/benefits/side effects discussed with patient.     All of the patient's questions were addressed. The patient understands and agrees with our plan of care.    The patient knows to call back if they are unsure of or forget any changes  we discussed today or if the symptoms change.     The patient received an After-Visit Summary which contains VS, orders, medication list and allergy list. This can be used as a "mini-medical record" should they have to seek medical care while out of town.    No care team member to display    Follow-up Disposition:  Return in about 3 months (around 05/24/2015) for fasting lab when he can..    Future Appointments  Date Time Provider Department Center   05/28/2015 8:40 AM Jacelyn GripSteven N Allesandra Huebsch, [facility]       Signed By: Jacelyn GripSteven N Aniayah Alaniz, MD     February 22, 2015

## 2015-02-21 NOTE — Progress Notes (Signed)
Chief Complaint   Patient presents with   ??? Physical     Body mass index is 42.15 kg/(m^2).    Reviewed record in preparation for visit and have necessary documentation  Pt did not bring medication to office visit for review  Information was given to pt on Advanced Directives, Living Will  Opportunity was given for questions  Goals that were addressed and/or need to be completed after this appointment include:       Health Maintenance Due   Topic Date Due   ??? Hepatitis C Screening  [birth year]   ??? DTaP/Tdap/Td series (1 - Tdap) 12/21/1969   ??? FOBT Q 1 YEAR AGE 75-75  12/22/1998   ??? GLAUCOMA SCREENING Q2Y  12/21/2013   ??? Pneumococcal 65+ Low/Medium Risk (1 of 2 - PCV13) 12/21/2013   ??? MEDICARE YEARLY EXAM  12/21/2013

## 2015-02-21 NOTE — Telephone Encounter (Signed)
Phone call to patient. Informed patient that we are working on getting him a new CPAP machine but we need records of his last sleep study. Patient verbalized understanding and stated that he would email the name of the Doctor and the location of his sleep study.

## 2015-02-21 NOTE — Patient Instructions (Addendum)
Learning About Coronary Artery Disease (CAD)  What is coronary artery disease?     Coronary artery disease (CAD) occurs when plaque builds up in the arteries that bring oxygen-rich blood to your heart. Plaque is a fatty substance made of cholesterol, calcium, and other substances in the blood. This process is called hardening of the arteries, or atherosclerosis.  What happens when you have coronary artery disease?  ?? Plaque may narrow the coronary arteries. Narrowed arteries cause poor blood flow. This can lead to angina symptoms such as chest pain or discomfort. If blood flow is completely blocked, you could have a heart attack.  ?? You can slow CAD and reduce the risk of future problems by making changes in your lifestyle. These include quitting smoking and eating heart-healthy foods.  ?? Treatments for CAD, along with changes in your lifestyle, can help you live a longer and healthier life.  How can you prevent coronary artery disease?  ?? Do not smoke. It may be the best thing you can do to prevent heart disease. If you need help quitting, talk to your doctor about stop-smoking programs and medicines. These can increase your chances of quitting for good.  ?? Be active. Get at least 30 minutes of exercise on most days of the week. Walking is a good choice. You also may want to do other activities, such as running, swimming, cycling, or playing tennis or team sports.  ?? Eat heart-healthy foods. Eat more fruits and vegetables and less foods that contain saturated and trans fats. Limit alcohol, sodium, and sweets.  ?? Stay at a healthy weight. Lose weight if you need to.  ?? Manage other health problems such as diabetes, high blood pressure, and high cholesterol.  ?? Manage stress. Stress can hurt your heart. To keep stress low, talk about your problems and feelings. Don't keep your feelings hidden.  ?? If you have talked about it with your doctor, take a low-dose aspirin  every day. Aspirin can help certain people lower their risk of a heart attack or stroke. But taking aspirin isn't right for everyone, because it can cause serious bleeding. Do not start taking daily aspirin unless your doctor knows about it.  How is coronary artery disease treated?  ?? Your doctor will suggest that you make lifestyle changes. For example, your doctor may ask you to eat healthy foods, quit smoking, lose extra weight, and be more active.  ?? You will have to take medicines.  ?? Your doctor may suggest a procedure to open narrowed or blocked arteries. This is called angioplasty. Or your doctor may suggest using healthy blood vessels to create detours around narrowed or blocked arteries. This is called bypass surgery.  Follow-up care is a key part of your treatment and safety. Be sure to make and go to all appointments, and call your doctor if you are having problems. It's also a good idea to know your test results and keep a list of the medicines you take.  Where can you learn more?  Go to http://www.healthwise.net/GoodHelpConnections  Enter C643 in the search box to learn more about "Learning About Coronary Artery Disease (CAD)."  ?? 2006-2016 Healthwise, Incorporated. Care instructions adapted under license by [employer] (which disclaims liability or warranty for this information). This care instruction is for use with your licensed healthcare professional. If you have questions about a medical condition or this instruction, always ask your healthcare professional. Healthwise, Incorporated disclaims any warranty or liability for your use of this   information.  Content Version: 11.0.578772; Current as of: April 24, 2014        Sleep Apnea: Care Instructions  Your Care Instructions  Sleep apnea means that you frequently stop breathing for 10 seconds or longer during sleep. It can be mild to severe, based on the number of times an hour that you stop breathing or have slowed breathing.   Blocked or narrowed airways in your nose, mouth, or throat can cause sleep apnea. Your airway can become blocked when your throat muscles and tongue relax during sleep.  You can treat sleep apnea at home by making lifestyle changes. You also can use a CPAP breathing machine that keeps tissues in the throat from blocking your airway. Or your doctor may suggest that you use a breathing device while you sleep. It helps keep your airway open. This could be a device that you put in your mouth. Other examples include strips or disks that you use on your nose. In some cases, surgery may be needed to remove enlarged tissues in the throat.  Follow-up care is a key part of your treatment and safety. Be sure to make and go to all appointments, and call your doctor if you are having problems. It's also a good idea to know your test results and keep a list of the medicines you take.  How can you care for yourself at home?  ?? Lose weight, if needed. It may reduce the number of times you stop breathing or have slowed breathing.  ?? Sleep on your side. It may stop mild apnea. If you tend to roll onto your back, sew a pocket in the back of your pajama top. Put a tennis ball into the pocket, and stitch the pocket shut. This will help keep you from sleeping on your back.  ?? Avoid alcohol and medicines such as sleeping pills and sedatives before bed.  ?? Do not smoke. Smoking can make sleep apnea worse. If you need help quitting, talk to your doctor about stop-smoking programs and medicines. These can increase your chances of quitting for good.  ?? Prop up the head of your bed 4 to 6 inches by putting bricks under the legs of the bed.  ?? Treat breathing problems, such as a stuffy nose, caused by a cold or allergies.  ?? Try a continuous positive airway pressure (CPAP) breathing machine if your doctor recommends it. The machine keeps your airway open when you sleep.  ?? If CPAP does not work for you, ask your doctor if you can try other  breathing machines. A bilevel positive airway pressure machine uses one type of air pressure for breathing in and another type for breathing out. Another device raises or lowers air pressure as needed while you breathe.  ?? Talk to your doctor if:  ?? Your nose feels dry or bleeds when you use one of these machines. You may need to increase moisture in the air. A humidifier may help.  ?? Your nose is runny or stuffy from using a breathing machine. Decongestants or a corticosteroid nasal spray may help.  ?? You are sleepy during the day and it gets in the way of the normal things you do. Do not drive when you are drowsy.  When should you call for help?  Watch closely for changes in your health, and be sure to contact your doctor if:  ?? You still have sleep apnea even though you have made lifestyle changes.  ?? You are thinking of  trying a device such as CPAP.  ?? You are having problems using a CPAP or similar machine.  Where can you learn more?  Go to [employer]Stats.ca  Enter J936 in the search box to learn more about "Sleep Apnea: Care Instructions."  ?? 2006-2016 Healthwise, Incorporated. Care instructions adapted under license by [employer] (which disclaims liability or warranty for this information). This care instruction is for use with your licensed healthcare professional. If you have questions about a medical condition or this instruction, always ask your healthcare professional. Healthwise, Incorporated disclaims any warranty or liability for your use of this information.  Content Version: 11.0.578772; Current as of: Aug 19, 2014

## 2015-03-10 NOTE — Telephone Encounter (Signed)
Order faxed to [facility] at [phone].

## 2015-03-10 NOTE — Telephone Encounter (Signed)
-----   Message from Rolm BaptiseLisa M Hudson sent at 03/10/2015  2:03 PM EST -----  Regarding: Dr. Maureen ChattersSpence/Telephone  Michelle from [facility] would like a call back regarding the order for the patient's cpat machine. The order is missing the name, address, and phone number and the pressure setting is missing from the checked box for the cpat machine. They would like another fax sent over with the information at [phone].

## 2015-03-11 NOTE — Telephone Encounter (Signed)
Detailed order for C PAP is missing name, address, telephone # and pressure setting  Please send new order to [phone]

## 2015-03-12 ENCOUNTER — Encounter: Attending: Specialist | Primary: Family Medicine

## 2015-04-09 ENCOUNTER — Ambulatory Visit: Attending: Specialist | Primary: Family Medicine

## 2015-04-09 MED ORDER — AMLODIPINE 5 MG TAB
5 mg | ORAL_TABLET | Freq: Every day | ORAL | 3 refills | Status: DC
Start: 2015-04-09 — End: 2015-06-24

## 2015-04-09 NOTE — Progress Notes (Signed)
Dictation on: 04/09/2015 11:50 AM by: Prachi Oftedahl [[id number]]

## 2015-04-09 NOTE — Progress Notes (Signed)
Subjective:  Elijah [name] is originally from [place], [place].  He had a multivessel bypass there six years ago.  He generally has done well.  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  He has not been able to do the Bruce protocol for an ETT and instead had a pharmacological stress test done there most recently last year, he believes passed those.  He has no chest pain, chest pressure, PND, orthopnea.  Has not had syncope.  Has really limited other medical history.  He was tried initially on Metoprolol post bypass surgery, but had fatigue and put on Bystolic and since he has been on Bystolic and Lisinopril his blood pressure has done well and he has had no fatigue.  He has had some personal tragedy of late, which has been stressful for him and limiting his exercise.  He has had some mild lower extremity edema that has now resolved.  He lives now in [place], but comes to [place] to check on family.  He has moved from [place].  He is still actively pursuing flying and there is an FAA regulation that will be changing, so it is not clear as to when his next stress test will be due.  His medications, social history, family history, etc. are reviewed in the chart and his blood pressure is well controlled.    Impression/Plan:  1. Coronary artery disease, status post bypass surgery.  Has done well since then with no current angina.  Is tolerating a beta blocker, ACE inhibitor, statin and aspirin without difficulty.  2. Blood pressure well controlled.  3. Lipids.  Will need to check what most recent labs and whether at goal.  An NMR LipoProfile is pending as ordered by Dr. Mliss [name] in November.  4. At this point I will see him back in one year here in [place], though I am happy to [facility] at [facility] [place] [place] if that is more convenient to him.  If he needs further testing, including for the  FAA, may still need another stress test in March, that should be as a pharmacological stress test as he is not able to walk on the treadmill to get adequate heart rate in the past and can be arranged.  His current medications are appropriate, it is a pleasure to meet him and will see him back gladly at any time.  His previous cardiologist in [place] was Dr. Donato SchultzMark Skains.  Will get copy of records from Dr. Anne FuSkains' office.    Elijah [name]:     Elijah N. Mliss SaxSpence, MD

## 2015-04-09 NOTE — Progress Notes (Signed)
Dictation on: 04/09/2015 11:50 AM by: Chella Chapdelaine [[id number]]

## 2015-04-09 NOTE — Progress Notes (Signed)
Health Maintenance Due   Topic Date Due   ??? Hepatitis C Screening  10/27/1950   ??? DTaP/Tdap/Td series (1 - Tdap) 12/21/1969   ??? FOBT Q 1 YEAR AGE 67-75  12/22/1998   ??? GLAUCOMA SCREENING Q2Y  12/21/2013   ??? Pneumococcal 65+ Low/Medium Risk (1 of 2 - PCV13) 12/21/2013   ??? MEDICARE YEARLY EXAM  12/21/2013

## 2015-04-09 NOTE — Progress Notes (Signed)
Dictation on: 04/09/2015 11:50 AM by: Reeder Brisby [[id number]]

## 2015-04-09 NOTE — Progress Notes (Signed)
Dictation on: 04/09/2015 11:50 AM by: Jerett Odonohue [[id number]]

## 2015-04-09 NOTE — Patient Instructions (Signed)
Mathews RobinsonsVipal Mustaf Antonacci, MD    [phone]  [facility] [address]   [address] on [address] Floor

## 2015-04-09 NOTE — Progress Notes (Signed)
Dictation on: 04/09/2015 11:50 AM by: Miyoko Hashimi [[id number]]

## 2015-04-09 NOTE — Progress Notes (Signed)
Dictation on: 04/09/2015 11:50 AM by: Jsaon Yoo [[id number]]

## 2015-04-09 NOTE — Progress Notes (Signed)
Dictation on: 04/09/2015 11:50 AM by: Haedyn Ancrum [[id number]]

## 2015-04-09 NOTE — Progress Notes (Deleted)
Dictation on: 04/09/2015 11:50 AM by: Kaysin Brock [[id number]]

## 2015-04-09 NOTE — Progress Notes (Signed)
Dictation on: 04/09/2015 11:50 AM by: Clinten Howk [[id number]]

## 2015-04-09 NOTE — Progress Notes (Signed)
Dictation on: 04/09/2015 11:50 AM by: Torianne Laflam [[id number]]

## 2015-04-09 NOTE — Progress Notes (Signed)
Dictation on: 04/09/2015 11:50 AM by: Mathews RobinsonsSABHARWAL, Michole Lecuyer [[id number]][[id number]]

## 2015-04-09 NOTE — Progress Notes (Signed)
Dictation on: 04/09/2015 11:50 AM by: Marcelis Wissner [[id number]]

## 2015-04-09 NOTE — Progress Notes (Signed)
Dictation on: 04/09/2015 11:50 AM by: Quasim Doyon [[id number]]

## 2015-04-09 NOTE — Progress Notes (Addendum)
Elijah [name]      [birth year]   Mathews Robinsons, MD  Date of Visit- 04-09-2015         [facility],  PCP=Steven Burna Forts, MD     [facility]  [facility].      HPI:   Elijah [name] is a 65 y.o. 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 Coronary artery disease involving native coronary artery of native heart without angina pectoris    2. Essential hypertension    3. Pure hypercholesterolemia    4. Morbid obesity due to excess calories (HCC)         Tried on metoprolol post CABG, did not tolerate and change to [employer]  Unfortunate personal tragedy with son dying last year very stressful and limiting exercise  Mild lower extremity edema  Now resolved    Lives in [place] and has family in [place]  Wants to remain active flyer and with FAA may change regs he is not sure what he needs [place] to do    CAD,CABG  ---no angina, BB , ACE statin and aspirin  HTN--fine control  Dyslipidemia  --check last labs  --NMR       Fu one year in [place] for OV or other offices for pharmacologic stress test  meds appropriate  Previous cards Dr Donato Schultz    Obesity- marked  Future Appointments  Date Time Provider Department Center   05/28/2015 8:40 AM Jacelyn Grip, MD [facility]       Cardiac History:   No specialty comments available.     NWG:NFAOZHY complete  as above. Respiratory as above with no wheezing or hemoptysis.   He denies  symptoms of unusual weight loss , fevers,  BRBPR, hematuria,  or recent stroke     History reviewed. No pertinent past medical history.   Exam and Labs:  Visit Vitals   ??? BP 126/82   ??? Pulse 72   ??? Temp 97.2 ??F (36.2 ??C) (Oral)   ??? Resp 20   ??? Ht 6\' 3"  (1.905 m)   ??? Wt 329 lb (149.2 kg)   ??? SpO2 97%   ??? BMI 41.12 kg/m2     Constitutional:  NAD, comfortable , moist mucous membranesHENT: Head: NC,ATEyes: No scleral icterus. Neck:  Neck supple. No JVD present. No tracheal deviation,mass  Chest: Effort normal & normal respiratory excursion     Lungs:breath sounds normal. No stridor. distress, wheezes or  Rales.  Heart:normal rate, regular rhythm, normal S1, S2, no murmurs, rubs, clicks or gallops , PMI non displaced.    Edema: Edema is none.    Extremities:  no clubbing or cyanosis.  Abdominal:  no abnormal distension.   Neurological: alert, conversant and oriented . Skin: Skin is not cold. No obvious systemic rash noted. Not diaphoretic. No erythema. Psychiatric:  Grossly normal mood and affect.  Behavior appears normal.  No results found for this or any previous visit.      Wt Readings from Last 3 Encounters:   04/09/15 329 lb (149.2 kg)   02/21/15 337 lb 3.2 oz (153 kg)    BP Readings from Last 3 Encounters:   04/09/15 126/82   02/21/15 117/75        Current Outpatient Prescriptions   Medication Sig   ??? amLODIPine (NORVASC) 5 mg tablet Take 1 Tab by mouth daily. Indications: Hypertension   ??? lisinopril (PRINIVIL, ZESTRIL) 20 mg tablet Take  by mouth daily.   ??? nebivolol (BYSTOLIC) 10 mg tablet Take  by mouth daily.   ??? potassium chloride SR (KLOR-CON 10) 10 mEq tablet Take  by mouth.   ??? atorvastatin (LIPITOR) 40 mg tablet Take  by mouth daily.   ??? aspirin (ASPIRIN) 325 mg tablet Take 325 mg by mouth daily.   ??? DOCOSAHEXANOIC ACID/EPA (FISH OIL PO) Take  by mouth.   ??? Flaxseed Oil oil by Does Not Apply route.     No current facility-administered medications for this visit.       Past Surgical History   Procedure Laterality Date   ??? Hx hip replacement Bilateral        Social Hx=  reports that he has quit smoking. His smoking use included Cigarettes. He does not have any smokeless tobacco history on file.   Family Hx= family history is not on file.  Impression see above.            --------------------------------------------------------  Transcriptions that could not be automatically inserted into the note follow:  --------------------------------------------------------  Duplicate voice file.

## 2015-04-09 NOTE — Progress Notes (Deleted)
Dictation on: 04/09/2015 11:50 AM by: Adison Jerger [[id number]]

## 2015-04-09 NOTE — Progress Notes (Deleted)
Dictation on: 04/09/2015 11:50 AM by: Johnette Teigen [[id number]]

## 2015-04-09 NOTE — Progress Notes (Deleted)
Dictation on: 04/09/2015 11:50 AM by: Yoon Barca [[id number]]

## 2015-04-09 NOTE — Progress Notes (Signed)
Dictation on: 04/09/2015 11:50 AM by: Montrice Gracey [[id number]]

## 2015-04-09 NOTE — Progress Notes (Deleted)
Dictation on: 04/09/2015 11:50 AM by: Michiel Sivley [[id number]]

## 2015-04-09 NOTE — Progress Notes (Signed)
Dictation on: 04/09/2015 11:50 AM by: Salome Hautala [[id number]]

## 2015-04-09 NOTE — Progress Notes (Signed)
Dictation on: 04/09/2015 11:50 AM by: Damonica Chopra [[id number]]

## 2015-04-09 NOTE — Progress Notes (Signed)
Dictation on: 04/09/2015 11:50 AM by: Obdulia Steier [[id number]]

## 2015-04-09 NOTE — Progress Notes (Signed)
Dictation on: 04/09/2015 11:50 AM by: Avari Nevares [[id number]]

## 2015-04-09 NOTE — Progress Notes (Signed)
Dictation on: 04/09/2015 11:50 AM by: Dashon Mcintire [[id number]]

## 2015-04-09 NOTE — Progress Notes (Signed)
Dictation on: 04/09/2015 11:50 AM by: Marvelle Span [[id number]]

## 2015-04-09 NOTE — Progress Notes (Signed)
Dictation on: 04/09/2015 11:50 AM by: Taren Toops [[id number]]

## 2015-04-09 NOTE — Progress Notes (Signed)
Dictation on: 04/09/2015 11:50 AM by: Yoshika Vensel [[id number]]

## 2015-04-09 NOTE — Progress Notes (Signed)
Dictation on: 04/09/2015 11:50 AM by: Tiny Chaudhary [[id number]]

## 2015-04-09 NOTE — Progress Notes (Signed)
Dictation on: 04/09/2015 11:50 AM by: Luie Laneve [[id number]]

## 2015-04-09 NOTE — Progress Notes (Signed)
Dictation on: 04/09/2015 11:50 AM by: Kiala Faraj [[id number]]

## 2015-04-09 NOTE — Progress Notes (Signed)
Dictation on: 04/09/2015 11:50 AM by: Prisha Hiley [[id number]]

## 2015-04-09 NOTE — Progress Notes (Signed)
Dictation on: 04/09/2015 11:50 AM by: Zena Vitelli [[id number]]

## 2015-04-09 NOTE — Progress Notes (Signed)
Dictation on: 04/09/2015 11:50 AM by: Dee Paden [[id number]]

## 2015-04-09 NOTE — Progress Notes (Signed)
Dictation on: 04/09/2015 11:50 AM by: Keyira Mondesir [[id number]]

## 2015-04-09 NOTE — Progress Notes (Signed)
Dictation on: 04/09/2015 11:50 AM by: Jearldine Cassady [[id number]]

## 2015-04-10 NOTE — Telephone Encounter (Signed)
Left 2 messages with medical records for return call.       Mathews RobinsonsVipal Sabharwal, MD  Mikael Sprayaven N Tramell Piechota, RN ??    ?? Cc: Mathews RobinsonsVipal Sabharwal, MD ??   ??    ??    ??   ?? Get records [facility] [place] Dr Minus BreedingMicheal Skains    ??   ??

## 2015-04-10 NOTE — Telephone Encounter (Signed)
Patient identified times 2. Kim from [facility] called and gave a fax number of [phone].

## 2015-05-28 ENCOUNTER — Encounter: Attending: Family Medicine | Primary: Family Medicine

## 2015-06-24 ENCOUNTER — Ambulatory Visit: Attending: Family Medicine | Primary: Family Medicine

## 2015-06-24 ENCOUNTER — Ambulatory Visit
Admit: 2015-06-24 | Discharge: 2015-06-24 | Payer: PRIVATE HEALTH INSURANCE | Attending: Family Medicine | Primary: Family Medicine

## 2015-06-24 DIAGNOSIS — I10 Essential (primary) hypertension: Secondary | ICD-10-CM

## 2015-06-24 MED ORDER — PNEUMOCOCCAL 13-VAL CONJ VACCINE-DIP CRM (PF) 0.5 ML IM SYRINGE
0.5 mL | Freq: Once | INTRAMUSCULAR | 0 refills | Status: AC
Start: 2015-06-24 — End: 2015-06-24

## 2015-06-24 MED ORDER — NEBIVOLOL 10 MG TAB
10 mg | ORAL_TABLET | Freq: Every day | ORAL | 3 refills | Status: DC
Start: 2015-06-24 — End: 2016-09-03

## 2015-06-24 MED ORDER — LISINOPRIL 20 MG TAB
20 mg | ORAL_TABLET | Freq: Every day | ORAL | 3 refills | Status: DC
Start: 2015-06-24 — End: 2016-08-23

## 2015-06-24 MED ORDER — ASPIRIN 325 MG TAB
325 mg | ORAL_TABLET | Freq: Every day | ORAL | 0 refills | Status: DC
Start: 2015-06-24 — End: 2018-04-05

## 2015-06-24 MED ORDER — DIPHTH,PERTUS(ACEL)TETANUS VAC(PF) 2 LF-(5-3-5MCG)-5 LF/0.5 ML IM SUSP
2 Lf-(.5-5-3-5 mcg)-5Lf/0.5 mL | INJECTION | Freq: Once | INTRAMUSCULAR | 0 refills | Status: AC
Start: 2015-06-24 — End: 2015-06-24

## 2015-06-24 MED ORDER — AMLODIPINE 5 MG TAB
5 mg | ORAL_TABLET | Freq: Every day | ORAL | 3 refills | Status: DC
Start: 2015-06-24 — End: 2016-05-26

## 2015-06-24 MED ORDER — ATORVASTATIN 40 MG TAB
40 mg | ORAL_TABLET | Freq: Every day | ORAL | 3 refills | Status: DC
Start: 2015-06-24 — End: 2016-06-25

## 2015-06-24 MED ORDER — FLAXSEED OIL
Freq: Two times a day (BID) | 3 refills | Status: DC
Start: 2015-06-24 — End: 2016-09-03

## 2015-06-24 NOTE — Patient Instructions (Addendum)
Learning About High Cholesterol  What is high cholesterol?  Cholesterol is a type of fat in your blood. It is needed for many body functions, such as making new cells. Cholesterol is made by your body. It also comes from food you eat.  If you have too much cholesterol, it starts to build up in your arteries. This is called hardening of the arteries, or atherosclerosis. High cholesterol raises your risk of a heart attack and stroke.  There are different types of cholesterol. LDL is the "bad" cholesterol. High LDL can raise your risk for heart disease, heart attack, and stroke. HDL is the "good" cholesterol. High HDL is linked with a lower risk for heart disease, heart attack, and stroke.  Your cholesterol levels help your doctor find out your risk for having a heart attack or stroke.  How can you prevent high cholesterol?  A heart-healthy lifestyle can help you prevent high cholesterol. This lifestyle helps lower your risk for a heart attack and stroke.  ?? Eat heart-healthy foods.  ?? Eat fruits, vegetables, whole grains (like oatmeal), dried beans and peas, nuts and seeds, soy products (like tofu), and fat-free or low-fat dairy products.  ?? Replace butter, margarine, and hydrogenated or partially hydrogenated oils with olive and canola oils. (Canola oil margarine without trans fat is fine.)  ?? Replace red meat with fish, poultry, and soy protein (like tofu).  ?? Limit processed and packaged foods like chips, crackers, and cookies.  ?? Be active. Exercise can improve your cholesterol level. Get at least 30 minutes of exercise on most days of the week. Walking is a good choice. You also may want to do other activities, such as running, swimming, cycling, or playing tennis or team sports.  ?? Stay at a healthy weight. Lose weight if you need to.  ?? Don't smoke. If you need help quitting, talk to your doctor about stop-smoking programs and medicines. These can increase your chances of quitting for good.   How is high cholesterol treated?  The goal of treatment is to reduce your chances of having a heart attack or stroke. The goal is not to lower your cholesterol numbers only.  ?? You may make lifestyle changes, such as eating healthy foods, not smoking, losing weight, and being more active.  ?? You may have to take medicine.  Follow-up care is a key part of your treatment and safety. Be sure to make and go to all appointments, and call your doctor if you are having problems. It's also a good idea to know your test results and keep a list of the medicines you take.  Where can you learn more?  Go to http://www.healthwise.net/GoodHelpConnections.  Enter Q621 in the search box to learn more about "Learning About High Cholesterol."  Current as of: April 24, 2014  Content Version: 11.1  ?? 2006-2016 Healthwise, Incorporated. Care instructions adapted under license by [employer] (which disclaims liability or warranty for this information). If you have questions about a medical condition or this instruction, always ask your healthcare professional. Healthwise, Incorporated disclaims any warranty or liability for your use of this information.

## 2015-06-24 NOTE — Progress Notes (Signed)
Progress Note    Patient: Elijah PancoastHoward Bruce [name] MRN: [id number]  SSN: [id number]    Date of Birth: May 19, 1950  Age: 65 y.o.  Sex: male        Chief Complaint   Patient presents with   ??? Hypertension   ??? Cholesterol Problem   ??? Coronary Artery Disease         Subjective:     Encounter Diagnoses   Name Primary?   ??? Essential hypertension: blood work done elsewhere recently.  BP Readings from Last 3 Encounters:   06/24/15 121/65   04/09/15 126/82   02/21/15 117/75     Our goal is to normalize the blood pressure to decrease the risks of strokes and heart attacks. The patient is in agreement with the plan.     Yes   ??? Pure hypercholesterolemia:  Cardiovascular risks for him are: LDL goal is under 80  hypertension  hyperlipidemia.   Currently he takes Lipitor (atorvastatin) , 40 mg.   Myalgias: No   Fatigue: No   Other side effects: no  Wt Readings from Last 3 Encounters:   06/24/15 342 lb (155.1 kg)   04/09/15 329 lb (149.2 kg)   02/21/15 337 lb 3.2 oz (153 kg)     The patient is aware of our goal to reduce or eliminate the long term problems (such as strokes and heart attacks) related to poorly controlled hyperlipidemia.      ??? Coronary artery disease involving native coronary artery of native heart without angina pectoris: s/p CABG x5 vessels.      ??? OSA on CPAP:Controlled on CPAP. 100% compliant. Feels rested in AM.      ??? Neuropathy: hereditary. Getting worse.        ??? Colon cancer screening: had colon polyps. Due for colonoscopy.      ??? Skin lesions: mutiple. [facility]        Current and past medical information:    Current Medications after this visit::   Current Outpatient Prescriptions   Medication Sig   ??? amLODIPine (NORVASC) 5 mg tablet Take 1 Tab by mouth daily. Indications: hypertension   ??? lisinopril (PRINIVIL, ZESTRIL) 20 mg tablet Take 1 Tab by mouth daily.   ??? nebivolol (BYSTOLIC) 10 mg tablet Take 1 Tab by mouth daily.   ??? atorvastatin (LIPITOR) 40 mg tablet Take 1 Tab by mouth daily.    ??? aspirin (ASPIRIN) 325 mg tablet Take 1 Tab by mouth daily.   ??? Flaxseed Oil oil 1 Tab by Does Not Apply route two (2) times a day.   ??? diph,Pertuss,Acell,,Tet Vac-PF (ADACEL) 2 Lf-(2.5-5-3-5 mcg)-5Lf/0.5 mL susp 0.5 mL by IntraMUSCular route once for 1 dose.   ??? pneumococcal 13 val conj dip (PREVNAR 13, PF,) 0.5 mL syrg injection 0.5 mL by IntraMUSCular route once for 1 dose.   ??? potassium chloride SR (KLOR-CON 10) 10 mEq tablet Take  by mouth.   ??? DOCOSAHEXANOIC ACID/EPA (FISH OIL PO) Take  by mouth.     No current facility-administered medications for this visit.        Patient Active Problem List    Diagnosis Date Noted   ??? Morbid obesity due to excess calories (HCC) 04/24/2015   ??? Neuropathy 02/21/2015   ??? Essential hypertension 02/21/2015   ??? HLD (hyperlipidemia) 02/21/2015   ??? CAD (coronary artery disease) 02/21/2015   ??? OSA on CPAP 02/21/2015       Past Medical History:   Diagnosis  Date   ??? Morbid obesity due to excess calories (HCC) 04/24/2015       Allergies   Allergen Reactions   ??? Metoprolol Other (comments)     Patient states, made me feel horrible.       Past Surgical History:   Procedure Laterality Date   ??? HX HIP REPLACEMENT Bilateral        Social History     Social History   ??? Marital status: MARRIED     Spouse name: N/A   ??? Number of children: N/A   ??? Years of education: N/A     Social History Main Topics   ??? Smoking status: Former Smoker     Types: Cigarettes   ??? Smokeless tobacco: None   ??? Alcohol use None   ??? Drug use: None   ??? Sexual activity: Not Asked     Other Topics Concern   ??? None     Social History Narrative       Review of Systems   Constitutional: Negative.  Negative for chills, fever, malaise/fatigue and weight loss.   HENT: Negative.  Negative for hearing loss.    Eyes: Negative.  Negative for blurred vision and double vision.   Respiratory: Negative.  Negative for cough, hemoptysis, sputum production and shortness of breath.     Cardiovascular: Negative.  Negative for chest pain, palpitations and orthopnea.   Gastrointestinal: Negative.  Negative for abdominal pain, blood in stool, heartburn, nausea and vomiting.   Genitourinary: Negative.  Negative for dysuria, frequency and urgency.   Musculoskeletal: Negative.  Negative for back pain, myalgias and neck pain.   Skin: Negative.  Negative for rash.   Neurological: Negative.  Negative for dizziness, tingling, tremors, weakness and headaches.   Endo/Heme/Allergies: Negative.    Psychiatric/Behavioral: Negative.  Negative for depression.        Situational stress due to multiple family deaths this yr.     Objective:     Vitals:    06/24/15 0905   BP: 121/65   Pulse: 61   Resp: 20   Temp: 98.4 ??F (36.9 ??C)   TempSrc: Oral   SpO2: 97%   Weight: 342 lb (155.1 kg)   Height:  (1.905 m)      Body mass index is 42.75 kg/(m^2).    Physical Exam   Constitutional: He is oriented to person, place, and time and well-developed, well-nourished, and in no distress.   HENT:   Head: Normocephalic and atraumatic.   Mouth/Throat: Oropharynx is clear and moist.   Eyes: Right eye exhibits no discharge. Left eye exhibits no discharge. No scleral icterus.   Neck: No tracheal deviation present. No thyromegaly present.   No bruit.   Cardiovascular: Normal rate, regular rhythm and normal heart sounds.    Pulmonary/Chest: Effort normal and breath sounds normal.   Abdominal: Soft.   Neurological: He is alert and oriented to person, place, and time.   Skin: No rash noted. No erythema.   Psychiatric: Mood and affect normal.   Nursing note and vitals reviewed.        Health Maintenance Due   Topic Date Due   ??? Hepatitis C Screening  June 04, 1950   ??? DTaP/Tdap/Td series (1 - Tdap)-rx given 12/21/1969   ??? FOBT Q 1 YEAR AGE 17-75  12/22/1998   ??? GLAUCOMA SCREENING Q2Y  12/21/2013   ??? Pneumococcal 65+ Low/Medium Risk (1 of 2 - PCV13)- rx given 12/21/2013   ??? MEDICARE YEARLY EXAM  12/21/2013         Assessment and orders:        ICD-10-CM ICD-9-CM    1. Essential hypertension- controllled I10 401.9 NMR LIPOPROFILE      METABOLIC PANEL, COMPREHENSIVE      lisinopril (PRINIVIL, ZESTRIL) 20 mg tablet      nebivolol (BYSTOLIC) 10 mg tablet      atorvastatin (LIPITOR) 40 mg tablet   2. Pure hypercholesterolemia- needs NMR E78.00 272.0 NMR LIPOPROFILE      METABOLIC PANEL, COMPREHENSIVE      atorvastatin (LIPITOR) 40 mg tablet      Flaxseed Oil oil   3. Coronary artery disease involving native coronary artery of native heart without angina pectoris- no angina I25.10 414.01 NMR LIPOPROFILE      VITAMIN D, 25 HYDROXY      atorvastatin (LIPITOR) 40 mg tablet      aspirin (ASPIRIN) 325 mg tablet   4. OSA on CPAP- very compliant.   G47.33 327.23    5. Neuropathy- getting worse -needs vit B12 checked.   G62.9 355.9 VITAMIN B12   6. Colon cancer screening- due   Z12.11 V76.51 REFERRAL FOR COLONOSCOPY   7. Skin lesions: multiple. L98.9 709.9 REFERRAL TO DERMATOLOGY         Plan of care:  Discussed diagnoses in detail with patient.     Medication risks/benefits/side effects discussed with patient.     All of the patient's questions were addressed. The patient understands and agrees with our plan of care.    The patient knows to call back if they are unsure of or forget any changes we discussed today or if the symptoms change.     The patient received an After-Visit Summary which contains VS, orders, medication list and allergy list. This can be used as a "mini-medical record" should they have to seek medical care while out of town.    Patient Care Team:  Jacelyn Grip, MD as PCP - General (Family Practice)  Mathews Robinsons, MD (Cardiology)    Follow-up Disposition:  Return in about 6 months (around 12/25/2015).    Future Appointments  Date Time Provider Department Center   07/30/2015 9:00 AM Shary Key [facility] SCHED   12/23/2015 8:40 AM Jacelyn Grip, MD [facility] SCHED       Signed By: Jacelyn Grip, MD     June 24, 2015

## 2015-06-24 NOTE — Progress Notes (Signed)
Reviewed record in preparation for visit and have necessary documentation  Pt did not bring medication to office visit for review   Advanced Directives, Living Will on file    Goals that were addressed and/or need to be completed during or after this appointment include   Health Maintenance Due   Topic Date Due   ??? Hepatitis C Screening  [birth year]   ??? DTaP/Tdap/Td series (1 - Tdap) 12/21/1969   ??? FOBT Q 1 YEAR AGE 67-75  12/22/1998   ??? GLAUCOMA SCREENING Q2Y  12/21/2013   ??? Pneumococcal 65+ Low/Medium Risk (1 of 2 - PCV13) 12/21/2013   ??? MEDICARE YEARLY EXAM  12/21/2013

## 2015-06-26 ENCOUNTER — Encounter: Primary: Family Medicine

## 2015-06-26 NOTE — Telephone Encounter (Signed)
Form ready for pick up at the front desk.

## 2015-06-27 LAB — METABOLIC PANEL, COMPREHENSIVE
A-G Ratio: 1.6 (ref 1.2–2.2)
ALT (SGPT): 25 IU/L (ref 0–44)
AST (SGOT): 24 IU/L (ref 0–40)
Albumin: 3.8 g/dL (ref 3.6–4.8)
Alk. phosphatase: 63 IU/L (ref 39–117)
BUN/Creatinine ratio: 17 (ref 10–22)
BUN: 18 mg/dL (ref 8–27)
Bilirubin, total: 0.4 mg/dL (ref 0.0–1.2)
CO2: 20 mmol/L (ref 18–29)
Calcium: 8.7 mg/dL (ref 8.6–10.2)
Chloride: 108 mmol/L — ABNORMAL HIGH (ref 96–106)
Creatinine: 1.03 mg/dL (ref 0.76–1.27)
GFR est AA: 87 mL/min/{1.73_m2} (ref >59)
GFR est non-AA: 75 mL/min/{1.73_m2} (ref >59)
GLOBULIN, TOTAL: 2.4 g/dL (ref 1.5–4.5)
Glucose: 83 mg/dL (ref 65–99)
Potassium: 4.5 mmol/L (ref 3.5–5.2)
Protein, total: 6.2 g/dL (ref 6.0–8.5)
Sodium: 144 mmol/L (ref 134–144)

## 2015-06-27 LAB — NMR LIPOPROFILE WITH LIPIDS (WITHOUT GRAPH)
Cholesterol, Total: 147 mg/dL (ref 100–199)
HDL-C: 50 mg/dL (ref >39)
HDL-P (Total): 32.4 umol/L (ref >=30.5)
LDL size: 20.2 nm (ref >20.5)
LDL-C: 70 mg/dL (ref 0–99)
LDL-P: 1076 nmol/L — ABNORMAL HIGH (ref <1000)
LP-IR SCORE: 39 (ref <=45)
Small LDL-P: 562 nmol/L — ABNORMAL HIGH (ref <=527)
Triglycerides: 137 mg/dL (ref 0–149)

## 2015-06-27 LAB — VITAMIN B12: Vitamin B12: 411 pg/mL (ref 211–946)

## 2015-06-27 LAB — VITAMIN D, 25 HYDROXY: VITAMIN D, 25-HYDROXY: 26 ng/mL — ABNORMAL LOW (ref 30.0–100.0)

## 2015-06-30 NOTE — Telephone Encounter (Signed)
Patient called and said he had a letter done for him regarding his mother and it was supposed to be picked up, however he would like to know if it may be faxed to him instead at [phone]. If this can not be done please give him a call back at [phone].

## 2015-06-30 NOTE — Telephone Encounter (Signed)
Letter faxed to patient.

## 2015-07-21 ENCOUNTER — Encounter

## 2015-07-21 MED ORDER — POTASSIUM CHLORIDE SR 10 MEQ TAB
10 mEq | ORAL_TABLET | Freq: Every day | ORAL | 3 refills | Status: DC
Start: 2015-07-21 — End: 2015-09-16

## 2015-07-21 NOTE — Telephone Encounter (Signed)
Need to know exact dose of this medication.

## 2015-07-21 NOTE — Telephone Encounter (Signed)
Potassium Chloride 10 mEq  -- Take one tablet daily.

## 2015-07-21 NOTE — Telephone Encounter (Signed)
Pt states he need a dermatologist appointment for a spot on his head and different places.

## 2015-07-21 NOTE — Telephone Encounter (Signed)
Last labs on 06/26/15 and last office visit on 06/24/15. Pt states he need a dermatologist appointment for a spot on his head and different places.

## 2015-07-30 ENCOUNTER — Ambulatory Visit: Admit: 2015-07-30 | Discharge: 2015-07-30 | Payer: MEDICARE | Primary: Family Medicine

## 2015-07-30 DIAGNOSIS — Z0000 Encounter for general adult medical examination without abnormal findings: Secondary | ICD-10-CM

## 2015-07-30 NOTE — Patient Instructions (Addendum)
Medicare Part B Preventive Services Limitations Recommendation Scheduled   Bone Mass Measurement  (age 67 & older, biennial) Requires diagnosis related to osteoporosis or estrogen deficiency. Biennial benefit unless patient has history of long-term glucocorticoid tx or baseline is needed because initial test was by other method Last: NA    A DEXA scan is recommended after you turn 67 years of age to determine your risk for osteoporosis Next: Discuss with your physician   Cardiovascular Screening Blood Tests (every 5 years)  Total cholesterol, HDL, Triglycerides Order as a panel if possible Last: 06/27/15    Every Year Next: 06/26/16   Colorectal Cancer Screening  -Fecal occult blood test (annual)  -Flexible sigmoidoscopy (5y)  -Screening colonoscopy (10y)  -Barium Enema  Last: none    Every 5-10 years depending on your colonoscopy result starting at age 67 years Next: Is calling today to set up appt   Counseling to Prevent Tobacco Use (up to 8 sessions per year)  - Counseling greater than 3 and up to 10 minutes  - Counseling greater than 10 minutes Patients must be asymptomatic of tobacco-related conditions to receive as preventive service Last: NA Next: NA   Diabetes Screening Tests (at least every 3 years, Medicare covers annually or at 2561-month intervals for prediabetic patients)    Fasting blood sugar (FBS) or glucose tolerance test (GTT) Patient must be diagnosed with one of the following:  -Hypertension, Dyslipidemia, obesity, previous impaired FBS or GTT  ???Or any two of the following: overweight, FH of diabetes, age ?65, history of gestational diabetes, birth of baby weighing more than 9 pounds Last: 06/27/15    Blood Sugar = 83    Every 3 years Next: Check with yearly labs   Diabetes Self-Management Training (DSMT) (no USPSTF recommendation) Requires referral by treating physician for patient with diabetes or renal disease. 10 hours of initial DSMT session of no less than 30 minutes each  in a continuous 4820-month period.  2 hours of follow-up DSMT in subsequent years. Last: NA NA   Glaucoma Screening (no USPSTF recommendation) Diabetes mellitus, family history, African American, age 67 or over, Hispanic American, age 67 or over Last: August 2016    Every year Next: August 2017   Human Immunodeficiency Virus (HIV) Screening (annually for increased risk patients)  HIV-1 and HIV-2 by EIA, ELISA, rapid antibody test, or oral mucosa transudate Patient must be at increased risk for HIV infection per USPSTF guidelines or pregnant.  Tests covered annually for patients at increased risk.  Pregnant patients may receive up to 3 test during pregnancy. Last: NA Next: NA   Medical Nutrition Therapy (MNT) (for diabetes or renal disease not recommended schedule) Requires referral by treating physician for patient with diabetes or renal disease.  Can be provided in same year as diabetes self-management training (DSMT), and CMS recommends medical nutrition therapy take place after DSMT.  Up to 3 hours for initial year and 2 hours in subsequent years. Last: NA NA   Prostate Cancer Screening (annually up to age 67)  - Digital rectal exam (DRE)  - Prostate specific antigen (PSA) Annually (age 67 or over), DRE not paid separately when covered E/M service is provided on same date Last: long time ago Next:      Discuss with   your doctor if this screening is appropriate for you.   Seasonal Influenza Vaccination (annually)  Last: 12/12/14    Every Fall Please get one this Fall.   Shingles Vaccination  Last: 01/20/13  A shingles vaccine is recommended once in a lifetime after age 56 Complete   Pneumococcal Vaccination (once after 65)  Last:  Pneumovax: Unknown    Prevnar-13: Unknown   Next: Please get a Prevnar-13 at your local pharmacy   Hepatitis B Vaccinations (if medium/high risk) Medium/high risk factors:  End-stage renal disease,  Hemophiliacs who received Factor VIII or IX concentrates, Clients of  institutions for the mentally retarded, Persons who live in the same house as a HepB virus carrier, Homosexual men, Illicit injectable drug abusers. Last: NA Next: NA   Screening Mammography (biennial age 55-74)? Annually (age 46 or over) Last: NA Next: NA   Screening Pap Tests and Pelvic Examination (up to age 54 and after 38 if unknown history or abnormal study last 10 years) Every 24 months except high risk Last: NA Next: NA    Discuss with your doctor if this screening is appropriate for you.   Tetanus, Diphtheria and Pertussis (Tdap) Vaccination Booster One Booster as an adult and then tetanus every 10 years or as indicated Last: 09/02/14 Regular tetanus due in 2026   NA:  Not applicable to you    1) Consider getting your Prevnar-13 (Pneumonia) vaccine at your local pharmacy. You will need to get a Pneumovax-23 vaccine 1 year after you receive the Prevnar-13.    2) Set up your appointment for your colonoscopy.    3) Discuss with Dr. Mliss Sax whether or not a PSA (prostate) screening is appropriate for you.

## 2015-07-30 NOTE — Progress Notes (Signed)
Reviewed record in preparation for visit and have necessary documentation        Body mass index is 43 kg/(m^2).    Health Maintenance Due   Topic Date Due   ??? Hepatitis C Screening  09-26-1950   ??? DTaP/Tdap/Td series (1 - Tdap) 12/21/1969   ??? FOBT Q 1 YEAR AGE 67-75  12/22/1998   ??? GLAUCOMA SCREENING Q2Y  12/21/2013   ??? Pneumococcal 65+ Low/Medium Risk (1 of 2 - PCV13) 12/21/2013   ??? MEDICARE YEARLY EXAM  12/21/2013

## 2015-07-30 NOTE — Progress Notes (Signed)
This is an Initial Medicare Annual Wellness Exam (AWV) (Performed 12 months after IPPE or effective date of Medicare Part B enrollment, Once in a lifetime)    I have reviewed the patient's medical history in detail and updated the computerized patient record.     Dr. Jacelyn GripSteven N Spence referred Elijah [name], 24-Jul-1950, a 65 y.o. male for an initial Medicare Annual Wellness Visit (AWV)        History     Past Medical History:   Diagnosis Date   ??? Morbid obesity due to excess calories (HCC) 04/24/2015      Past Surgical History:   Procedure Laterality Date   ??? HX HIP REPLACEMENT Bilateral      Current Outpatient Prescriptions   Medication Sig Dispense Refill   ??? potassium chloride SR (KLOR-CON 10) 10 mEq tablet Take 1 Tab by mouth daily. 90 Tab 3   ??? amLODIPine (NORVASC) 5 mg tablet Take 1 Tab by mouth daily. Indications: hypertension 90 Tab 3   ??? lisinopril (PRINIVIL, ZESTRIL) 20 mg tablet Take 1 Tab by mouth daily. 90 Tab 3   ??? nebivolol (BYSTOLIC) 10 mg tablet Take 1 Tab by mouth daily. 90 Tab 3   ??? atorvastatin (LIPITOR) 40 mg tablet Take 1 Tab by mouth daily. 90 Tab 3   ??? aspirin (ASPIRIN) 325 mg tablet Take 1 Tab by mouth daily. 90 Tab 0   ??? Flaxseed Oil oil 1 Tab by Does Not Apply route two (2) times a day. 1 Bottle 3   ??? DOCOSAHEXANOIC ACID/EPA (FISH OIL PO) Take 2 Caps by mouth two (2) times a day.       Allergies   Allergen Reactions   ??? Metoprolol Other (comments)     Patient states, made me feel horrible.     Family History   Problem Relation Age of Onset   ??? Stroke Mother    ??? Other Father      mrsa infxn caused death   ??? Heart Attack Brother      Social History   Substance Use Topics   ??? Smoking status: Former Smoker     Types: Cigarettes   ??? Smokeless tobacco: Not on file   ??? Alcohol use No      Comment: recently has stopped     Patient Active Problem List   Diagnosis Code   ??? Neuropathy G62.9   ??? Essential hypertension I10   ??? HLD (hyperlipidemia) E78.5   ??? CAD (coronary artery disease) I25.10    ??? OSA on CPAP G47.33, Z99.89   ??? Morbid obesity due to excess calories (HCC) E66.01         Depression Risk Factor Screening:     PHQ over the last two weeks 07/30/2015   Little interest or pleasure in doing things Not at all   Feeling down, depressed or hopeless Not at all   Total Score PHQ 2 0     Alcohol Risk Factor Screening:   On any occasion during the past 3 months, have you had more than 4 drinks containing alcohol?  No    Do you average more than 14 drinks per week?  No    Functional Ability and Level of Safety:     Hearing Loss   none    Activities of Daily Living   Self-care.   Requires assistance with: no ADLs    ADL Assessment 07/30/2015   Feeding yourself No Help Needed   Getting from bed to  chair No Help Needed   Getting dressed No Help Needed   Bathing or showering No Help Needed   Walk across the room (includes cane/walker) No Help Needed   Using the telphone No Help Needed   Taking your medications No Help Needed   Preparing meals No Help Needed   Managing money (expenses/bills) No Help Needed   Moderately strenuous housework (laundry) No Help Needed   Shopping for personal items (toiletries/medicines) No Help Needed   Shopping for groceries No Help Needed   Driving No Help Needed   Climbing a flight of stairs No Help Needed   Getting to places beyond walking distances No Help Needed       Fall Risk     Fall Risk Assessment, last 12 mths 07/30/2015   Able to walk? Yes   Fall in past 12 months? No     Abuse Screen   Patient is not abused    Abuse Screening Questionnaire 07/30/2015   Do you ever feel afraid of your partner? N   Are you in a relationship with someone who physically or mentally threatens you? N   Is it safe for you to go home? Y       Review of Systems   Not required    Physical Examination     No exam data present    Evaluation of Cognitive Function:  Mood/affect:  neutral  Appearance: age appropriate and casually dressed  Family member/caregiver input: none present     No exam performed today, not required for AWV.    Patient Care Team:  Jacelyn Grip, MD as PCP - General (Family Practice)  Mathews Robinsons, MD (Cardiology)    Advice/Referrals/Counseling   Education and counseling provided:  Pneumococcal Vaccine  Influenza Vaccine  Prostate cancer screening tests (PSA, covered annually)  Colorectal cancer screening tests      Assessment/Plan       ICD-10-CM ICD-9-CM    1. Routine general medical examination at a health care facility Z00.00 V70.0    2. Screening for alcoholism Z13.89 V79.1      3. Reviewed medications and side effects in detail. Medication reconciliation was performed and directions were added to Omega-3's.    4. Vaccinations: Patient is due for Prevnar-13 vaccine and then will be due for a Pneumovax-23 vaccine 1 month later.    5. Screenings: See chart in patient instructions. Advised pt he was due for a colonoscopy and he knows he needs to set up an appointment. Also discussed PSA screening and advised patient to discuss with Dr. Mliss Sax if this is something he is interested in. We briefly discussed the risk/benefits and current recommendations.    6. Advanced care planning: already on file      Patient verbalized understanding of information presented.  Answered all of the patient's questions.  AVS handed and reviewed with patient.      Margo Aye, PharmD, BCPS, CDE

## 2015-08-13 NOTE — Telephone Encounter (Signed)
Pt is asking for a call back. Dr. Diana EvesSNS wanted him to make an appointment with one of the residents tomorrow. There are no residents tomorrow except for Dr. Katrinka BlazingSMith who comes in the p.m. She has nothing. Pt is asking to please speak to Nurse Katie. Please call Pt back at [phone]

## 2015-08-13 NOTE — Telephone Encounter (Signed)
Per Dr. Mliss SaxSpence: okay to schedule patient to see me tomorrow morning. Notified patient of appointment for tomorrow with Dr. Mliss SaxSpence.

## 2015-08-14 ENCOUNTER — Encounter: Admit: 2015-08-14 | Primary: Family Medicine

## 2015-08-14 ENCOUNTER — Ambulatory Visit: Admit: 2015-08-14 | Payer: MEDICARE | Attending: Family Medicine | Primary: Family Medicine

## 2015-08-14 DIAGNOSIS — Z299 Encounter for prophylactic measures, unspecified: Secondary | ICD-10-CM

## 2015-08-14 DIAGNOSIS — M545 Low back pain, unspecified: Secondary | ICD-10-CM

## 2015-08-14 MED ORDER — HYDROCODONE-ACETAMINOPHEN 10 MG-325 MG TAB
10-325 mg | ORAL_TABLET | Freq: Every evening | ORAL | 0 refills | Status: DC
Start: 2015-08-14 — End: 2015-09-16

## 2015-08-14 MED ORDER — PREDNISONE 10 MG TABLETS IN A DOSE PACK
10 mg | ORAL_TABLET | ORAL | 0 refills | Status: DC
Start: 2015-08-14 — End: 2015-09-17

## 2015-08-14 MED ORDER — PNEUMOCOCCAL 13-VAL CONJ VACCINE-DIP CRM (PF) 0.5 ML IM SYRINGE
0.5 mL | INJECTION | INTRAMUSCULAR | 0 refills | Status: DC
Start: 2015-08-14 — End: 2015-09-17

## 2015-08-14 NOTE — Patient Instructions (Signed)
Fecal Immunochemical Test (FIT): About This Test  What is it?  A fecal immunochemical test, or FIT, checks for hidden blood in the stool. Your doctor gives you a kit that contains everything you need. At home, you follow simple steps to collect a small amount of stool. You return the kit to the doctor or to a lab.  Why is this test done?  This test is done to check for colorectal cancer and other types of gastrointestinal problems, such as hemorrhoids, anal fissures, and colon polyps, that can cause blood in the stools.  How can you prepare for the test?  ?? Don't do the test during your menstrual period or if you are having bleeding from hemorrhoids.  What happens during the test?  There are different types of home tests available. It is important to follow the instructions provided with any test.  Here are some general instructions:  ?? Check the expiration date on the package. Don't use a test kit after its expiration date.  ?? Follow the instructions exactly. Do all the steps, in order, without skipping any of them.  ?? After you finish your test, follow the instructions that you were given for returning the test.  There is a FIT test that shows the results right away. If your test shows that blood was found in your stool sample, call your doctor as soon as possible.  Follow-up care is a key part of your treatment and safety. Be sure to make and go to all appointments, and call your doctor if you are having problems. It's also a good idea to keep a list of the medicines you take. Ask your doctor when you can expect to have your test results.  Where can you learn more?  Go to http://www.healthwise.net/[employer].  Enter A832 in the search box to learn more about "Fecal Immunochemical Test (FIT): About This Test."  Current as of: October 24, 2014  Content Version: 11.2  ?? 2006-2017 [employer]. Care instructions adapted under  license by [employer] (which disclaims liability or warranty for this information). If you have questions about a medical condition or this instruction, always ask your healthcare professional. [employer] disclaims any warranty or liability for your use of this information.

## 2015-08-14 NOTE — ACP (Advance Care Planning) (Signed)
Advanced Directives, Living Will on file

## 2015-08-14 NOTE — Progress Notes (Signed)
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 Proceed with PT as we discussed. Back specialists may be required.

## 2015-08-14 NOTE — Progress Notes (Signed)
Reviewed record in preparation for visit and have necessary documentation  Pt did not bring medication to office visit for review   Advanced Directives, Living Will on file    Goals that were addressed and/or need to be completed during or after this appointment include   Health Maintenance Due   Topic Date Due   ??? Hepatitis C Screening  February 25, 1951   ??? FOBT Q 1 YEAR AGE 67-75  12/22/1998   ??? Pneumococcal 65+ Low/Medium Risk (1 of 2 - PCV13) 12/21/2013     Pt has colonoscopy to be scheduled

## 2015-08-14 NOTE — Progress Notes (Signed)
Progress Note    Patient: Elijah [name] MRN: [id number]  SSN: [id number]    Date of Birth: Jul 12, 1950  Age: 64 y.o.  Sex: male        Chief Complaint   Patient presents with   ??? Hip Pain     left hip   ??? Back Pain         Subjective:     Encounter Diagnoses   Name Primary?   ??? Preventive measure:Prevnar 13 and FIT-test ordered   Yes   ??? Acute left-sided low back pain without sciatica: Left sided back pain over the sciatic notch for one week. This is worse pain than when he had his hips replaced. He has not been able to sleep. He is requesting a pain medication. He failed Aleve and Motrin at high doses and will need stronger treatment. He has no known reason not to take steroids and he knows if I give him pain medication he cannot fly within 36 hours of its use.  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 Hydrocodone worked for his pain after hip surgery.      ??? 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        ??? Essential :Controlled  BP Readings from Last 3 Encounters:   08/14/15 126/82   07/30/15 121/74   06/24/15 121/65     The patient reports:  taking medications as instructed, no medication side effects noted, no TIA's, no chest pain on exertion, no dyspnea on exertion, no swelling of ankles.     Lab Results   Component Value Date/Time    Sodium 144 06/26/2015 11:51 AM    Potassium 4.5 06/26/2015 11:51 AM    Chloride 108 06/26/2015 11:51 AM    CO2 20 06/26/2015 11:51 AM    Glucose 83 06/26/2015 11:51 AM    BUN 18 06/26/2015 11:51 AM    Creatinine 1.03 06/26/2015 11:51 AM    BUN/Creatinine ratio 17 06/26/2015 11:51 AM    GFR est AA 87 06/26/2015 11:51 AM    GFR est non-AA 75 06/26/2015 11:51 AM    Calcium 8.7 06/26/2015 11:51 AM    Bilirubin, total 0.4 06/26/2015 11:51 AM    AST (SGOT) 24 06/26/2015 11:51 AM    Alk. phosphatase 63 06/26/2015 11:51 AM    Protein, total 6.2 06/26/2015 11:51 AM    Albumin 3.8 06/26/2015 11:51 AM     A-G Ratio 1.6 06/26/2015 11:51 AM    ALT (SGPT) 25 06/26/2015 11:51 AM     Our goal is to normalize the blood pressure to decrease the risks of strokes and heart attacks. The patient is in agreement with the plan.        ??? Pure hypercholesterolemia:  AcceptableWith his CAD. Marland Kitchen  Cardiovascular risks necessitate normal particle counts if possible.  Currently he takes Lipitor (atorvastatin) , 40 mg.  Results for orders placed or performed in visit on 06/24/15   NMR LIPOPROFILE     Status: Abnormal   Result Value Ref Range Status    LDL-P 1076 (H) <1000 nmol/L Final     Comment:                           Low                   < 1000  Moderate         1000 - 1299                            Borderline-High  1300 - 1599                            High             1600 - 2000                            Very High             > 2000      LDL-C 70 0 - 99 mg/dL Final     Comment:                           Optimal               <  100                            Above optimal     100 -  129                            Borderline        130 -  159                            High              160 -  189                            Very high             >  189  LDL-C is inaccurate if patient is non-fasting.      HDL-C 50 >39 mg/dL Final    Triglycerides 137 0 - 149 mg/dL Final    Cholesterol, Total 147 100 - 199 mg/dL Final    HDL-P (Total) 32.4 >=30.5 umol/L Final    Small LDL-P 562 (H) <=527 nmol/L Final    LDL size 20.2 >20.5 nm Final     Comment:  ----------------------------------------------------------                   ** INTERPRETATIVE INFORMATION**                   PARTICLE CONCENTRATION AND SIZE                      <--Lower CVD Risk   Higher CVD Risk-->    LDL AND HDL PARTICLES   Percentile in Reference Population    HDL-P (total)        High     75th    50th    25th   Low                         >34.9    34.9    30.5    26.7   <26.7    Small LDL-P          Low  25th    50th    75th   High                          <117     117     527     839    >839    LDL Size   <-Large (Pattern A)->    <-Small (Pattern B)->                      23.0    20.6           20.5      19.0   ----------------------------------------------------------  Small LDL-P and LDL Size are associated with CVD risk, but not after  LDL-P is taken into account.  These assays were developed and their performance characteristics  determined by [employer]. These assays have not been cleared by the  [place] Food and Drug Administration. The clinical utility o  f these  laboratory values have not been fully established.      LP-IR SCORE 39 <=45 Final     Comment: INSULIN RESISTANCE MARKER      <--Insulin Sensitive    Insulin Resistant-->             Percentile in Reference Population  Insulin Resistance Score  LP-IR Score   Low   25th   50th   75th   High                <27   27     45     63     >63  LP-IR Score is inaccurate if patient is non-fasting.  The LP-IR score is a laboratory developed index that has been  associated with insulin resistance and diabetes risk and should be  used as one component of a physician's clinical assessment. The  LP-IR score listed above has not been cleared by the [place] Food and  Drug Administration.      Narrative    Performed at:  [address]  [address], [place], [place]  [id number]  Lab Director: Lindon Romp MD, Phone:  [phone]     Lab Results   Component Value Date/Time    AST (SGOT) 24 06/26/2015 11:51 AM    Alk. phosphatase 63 06/26/2015 11:51 AM     Myalgias: No  Fatigue: No  Wt Readings from Last 3 Encounters:   08/14/15 345 lb (156.5 kg)   07/30/15 344 lb (156 kg)   06/24/15 342 lb (155.1 kg)       The patient is aware of our goal to reduce or eliminate the long term problems (such as strokes and heart attacks) related to poorly controlled hyperlipidemia.  The accelerated atherogenicity of small LDL particles and too many particles was discussed using a model artery.         ??? OSA on CPAP:  Controlled on CPAP. 100% compliant. Feels rested in AM.        ??? DDD (degenerative disc disease), lumbar: New diagnosis based on his x-rays. He has severe DDD at L5 S1 with anterior spur formation. This certainly can explain his sciatic notch pain. He has no history of trauma. On x-ray his hip replacements were unremarkable..        Current and past medical information:    Current Medications after this visit::   Current Outpatient Prescriptions   Medication Sig   ??? pneumococcal 13  val conj dip (PREVNAR 13, PF,) 0.5 mL syrg injection 0.5 mL by IntraMUSCular route PRIOR TO DISCHARGE for 1 dose.   ??? HYDROcodone-acetaminophen (NORCO) 10-325 mg tablet Take 1 Tab by mouth nightly. Max Daily Amount: 1 Tab.   ??? predniSONE (STERAPRED DS) 10 mg dose pack See administration instruction per 52m dose pack   ??? potassium chloride SR (KLOR-CON 10) 10 mEq tablet Take 1 Tab by mouth daily.   ??? amLODIPine (NORVASC) 5 mg tablet Take 1 Tab by mouth daily. Indications: hypertension   ??? lisinopril (PRINIVIL, ZESTRIL) 20 mg tablet Take 1 Tab by mouth daily.   ??? nebivolol (BYSTOLIC) 10 mg tablet Take 1 Tab by mouth daily.   ??? atorvastatin (LIPITOR) 40 mg tablet Take 1 Tab by mouth daily.   ??? aspirin (ASPIRIN) 325 mg tablet Take 1 Tab by mouth daily.   ??? Flaxseed Oil oil 1 Tab by Does Not Apply route two (2) times a day.   ??? DOCOSAHEXANOIC ACID/EPA (FISH OIL PO) Take 2 Caps by mouth two (2) times a day.     No current facility-administered medications for this visit.        Patient Active Problem List    Diagnosis Date Noted   ??? Morbid obesity due to excess calories ([place] 04/24/2015   ??? Neuropathy 02/21/2015   ??? Essential hypertension 02/21/2015   ??? HLD (hyperlipidemia) 02/21/2015   ??? CAD (coronary artery disease) 02/21/2015   ??? OSA on CPAP 02/21/2015       Past Medical History:   Diagnosis Date   ??? Morbid obesity due to excess calories ([place] 04/24/2015       Allergies   Allergen Reactions   ??? Metoprolol Other (comments)      Patient states, made me feel horrible.       Past Surgical History:   Procedure Laterality Date   ??? HX HIP REPLACEMENT Bilateral        Social History     Social History   ??? Marital status: MARRIED     Spouse name: N/A   ??? Number of children: N/A   ??? Years of education: N/A     Social History Main Topics   ??? Smoking status: Former Smoker     Types: Cigarettes   ??? Smokeless tobacco: None   ??? Alcohol use No      Comment: recently has stopped   ??? Drug use: No   ??? Sexual activity: Not Asked     Other Topics Concern   ??? None     Social History Narrative       Review of Systems   Constitutional: Negative.  Negative for chills, fever, malaise/fatigue and weight loss.   HENT: Negative.  Negative for hearing loss.    Eyes: Negative.  Negative for blurred vision and double vision.   Respiratory: Negative.  Negative for cough, hemoptysis, sputum production and shortness of breath.    Cardiovascular: Negative.  Negative for chest pain, palpitations and orthopnea.   Gastrointestinal: Negative.  Negative for abdominal pain, blood in stool, heartburn, nausea and vomiting.   Genitourinary: Negative.  Negative for dysuria, frequency and urgency.   Musculoskeletal: Positive for back pain. Negative for falls, joint pain, myalgias and neck pain.   Skin: Negative.  Negative for rash.   Neurological: Negative for dizziness, tingling, tremors, weakness and headaches.   Endo/Heme/Allergies: Negative.    Psychiatric/Behavioral: Negative.  Negative for depression.        Objective:  Vitals:    08/14/15 1025   BP: 126/82   Pulse: (!) 57   Resp: 20   Temp: 97.5 ??F (36.4 ??C)   TempSrc: Oral   SpO2: 97%   Weight: 345 lb (156.5 kg)   Height: '6\' 3"'  (1.905 m)      Body mass index is 43.12 kg/(m^2).    Physical Exam   Constitutional: He is oriented to person, place, and time and well-developed, well-nourished, and in no distress.   HENT:   Head: Normocephalic and atraumatic.   Mouth/Throat: Oropharynx is clear and moist.    Eyes: Right eye exhibits no discharge. Left eye exhibits no discharge. No scleral icterus.   Neck: No tracheal deviation present. No thyromegaly present.   No bruit.   Cardiovascular: Normal rate, regular rhythm and normal heart sounds.    Pulmonary/Chest: Effort normal and breath sounds normal.   Abdominal: Soft.   Musculoskeletal: He exhibits tenderness.   He has point tenderness over his left sciatic notch.He has a full range of motion. Leaning forward does not make it hurt worse and side bending does not make it feel worse.The pain does not radiate.He got minor relief using ice and lidocaine patch but he still cannot sleep.   Neurological: He is alert and oriented to person, place, and time.   Skin: No rash noted. No erythema.   Psychiatric: Mood and affect normal.   Nursing note and vitals reviewed.        Health Maintenance Due   Topic Date Due   ??? Hepatitis C Screening  08-Feb-1951   ??? FOBT Q 1 YEAR AGE 66-75  12/22/1998   ??? Pneumococcal 65+ Low/Medium Risk (1 of 2 - PCV13) 12/21/2013         Assessment and orders:       ICD-10-CM ICD-9-CM    1. Preventive measure Z29.9 V07.9 OCCULT BLOOD, IMMUNOASSAY (FIT)   2. Acute left-sided low back pain without sciatica-see x-ray report   M54.5 724.2 XR SPINE LUMB MIN 4 V  PT referral done   3. Neuropathy-Long-standing   G62.9 355.9    4. Essential hypertension  -Controlled   I10 401.9    5. Pure hypercholesterolemia  -Controlled   E78.00 272.0    6. OSA on CPAP G47.33 327.23     Z99.89 V46.8    7. DDD (degenerative disc disease), lumbar-new problem for him M51.36 722.52 REFERRAL TO PHYSICAL THERAPY,   His hydrocodone tablet is considered to be a short-term medicine and I hope he can get off of it very quickly. We will only take it at bedtime.  He may very well need referral to a back specialist if this is not improved with physical therapy pretty quickly.         Plan of care:  Discussed diagnoses in detail with patient.      Medication risks/benefits/side effects discussed with patient.     All of the patient's questions were addressed. The patient understands and agrees with our plan of care.    The patient knows to call back if they are unsure of or forget any changes we discussed today or if the symptoms change.     The patient received an After-Visit Summary which contains VS, orders, medication list and allergy list. This can be used as a "mini-medical record" should they have to seek medical care while out of town.    Patient Care Team:  [name], MD as PCP - [facility])  Vipal  Sabharwal, MD (Cardiology)    Follow-up Disposition:  Return in about 3 months (around 11/14/2015).    Future Appointments  Date Time Provider Department Center   12/23/2015 8:40 AM [name], MD [facility] SCHED       Signed By: [name], MD     Aug 14, 2015

## 2015-08-15 NOTE — Telephone Encounter (Signed)
-----   Message from Jacelyn GripSteven N Spence, MD sent at 08/14/2015 12:49 PM EDT -----  He has severe DDD at L5-S1 with spur formation. Proceed with PT as we discussed. Back specialists may be required.

## 2015-08-15 NOTE — Telephone Encounter (Signed)
Phone call to patient. Per Dr. Mliss SaxSpence:      ??   ?? 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 Proceed with PT as we discussed. Back specialists may be required.       Patient verbalized understanding.

## 2015-09-09 ENCOUNTER — Telehealth

## 2015-09-09 MED ORDER — DOCUSATE SODIUM 100 MG CAP
100 mg | ORAL_CAPSULE | Freq: Two times a day (BID) | ORAL | 2 refills | Status: DC | PRN
Start: 2015-09-09 — End: 2015-09-17

## 2015-09-09 NOTE — Telephone Encounter (Signed)
Hydrocodene has made Elijah [name] constipated.  Ask MD to prescribe a med to give him relief, if he does not hear from us by 3pm today he will call back  Elijah [name] can be reached at [phone]

## 2015-09-09 NOTE — Telephone Encounter (Signed)
Rx for colace sent to pharmacy.

## 2015-09-16 ENCOUNTER — Ambulatory Visit: Admit: 2015-09-16 | Discharge: 2015-09-16 | Payer: MEDICARE | Attending: Family Medicine | Primary: Family Medicine

## 2015-09-16 DIAGNOSIS — G8929 Other chronic pain: Secondary | ICD-10-CM

## 2015-09-16 MED ORDER — HYDROCODONE-ACETAMINOPHEN 10 MG-325 MG TAB
10-325 mg | ORAL_TABLET | Freq: Every evening | ORAL | 0 refills | Status: DC
Start: 2015-09-16 — End: 2015-11-07

## 2015-09-16 MED ORDER — POTASSIUM CHLORIDE SR 10 MEQ TAB
10 mEq | ORAL_TABLET | Freq: Every day | ORAL | 3 refills | Status: DC
Start: 2015-09-16 — End: 2016-09-02

## 2015-09-16 NOTE — Progress Notes (Signed)
[facility]    Subjective:   Elijah [name] is a 65 y.o. male with history of DDD, OSA, Bilateral hip replacemnts  CC: Back pain  History provided by patient and records    HPI:  Hip Pain:  Patient with chronic left hip pain, patient with pain Contract with Dr. Mliss SaxSpence who is currently out.  Patient states that his back pain has continued with minimal improvement or changes. Pain is described as moderate to severe intermitenet pain in the left hip/back area that is exacerbated with prolonged sitting and recumbency.  Denies radiating pains, noting some buring sensations in feet, but this is chronic in nature.  Also noting some weakness in the left leg compared to right during therapy.   Patient has been doing physical therapy with minimal change in symptoms at this time, but only 3 sessions so far.  Patient using Norco at night and noting some improvement of pain, but minimal.  Patient states minimal pain on this visit.     Obstructive Sleep Apnea:   [place] early [place], initially well controlled on CPAP, but does not believe C-Pap is working as well as previously.  Poor sleep maintenance secondary to apnea combined with back pain.    PFSH: Colonoscopy on Thursday    Current Outpatient Prescriptions on File Prior to Visit   Medication Sig Dispense Refill   ??? amLODIPine (NORVASC) 5 mg tablet Take 1 Tab by mouth daily. Indications: hypertension 90 Tab 3   ??? lisinopril (PRINIVIL, ZESTRIL) 20 mg tablet Take 1 Tab by mouth daily. 90 Tab 3   ??? nebivolol (BYSTOLIC) 10 mg tablet Take 1 Tab by mouth daily. 90 Tab 3   ??? atorvastatin (LIPITOR) 40 mg tablet Take 1 Tab by mouth daily. 90 Tab 3   ??? aspirin (ASPIRIN) 325 mg tablet Take 1 Tab by mouth daily. 90 Tab 0   ??? Flaxseed Oil oil 1 Tab by Does Not Apply route two (2) times a day. 1 Bottle 3   ??? DOCOSAHEXANOIC ACID/EPA (FISH OIL PO) Take 2 Caps by mouth two (2) times a day.      ??? docusate sodium (COLACE) 100 mg capsule Take 1 Cap by mouth two (2) times daily as needed for Constipation for up to 90 days. 60 Cap 2   ??? pneumococcal 13 val conj dip (PREVNAR 13, PF,) 0.5 mL syrg injection 0.5 mL by IntraMUSCular route PRIOR TO DISCHARGE for 1 dose. 1 Syringe 0   ??? predniSONE (STERAPRED DS) 10 mg dose pack See administration instruction per 10mg  dose pack 21 Tab 0     No current facility-administered medications on file prior to visit.        Patient Active Problem List   Diagnosis Code   ??? Neuropathy G62.9   ??? Essential hypertension I10   ??? HLD (hyperlipidemia) E78.5   ??? CAD (coronary artery disease) I25.10   ??? OSA on CPAP G47.33, Z99.89   ??? Morbid obesity due to excess calories (HCC) E66.01       Social History     Social History   ??? Marital status: MARRIED     Spouse name: N/A   ??? Number of children: N/A   ??? Years of education: N/A     Occupational History   ??? Not on file.     Social History Main Topics   ??? Smoking status: Former Smoker     Types: Cigarettes   ??? Smokeless tobacco:  Not on file   ??? Alcohol use No      Comment: recently has stopped   ??? Drug use: No   ??? Sexual activity: Not on file     Other Topics Concern   ??? Not on file     Social History Narrative       Review of Systems   Constitutional: Negative for chills, fever and malaise/fatigue.   HENT: Negative for hearing loss.    Respiratory: Negative for cough and shortness of breath.    Cardiovascular: Negative for chest pain and palpitations.   Gastrointestinal: Negative for abdominal pain, nausea and vomiting.   Musculoskeletal: Positive for back pain.   Neurological: Negative for headaches.         Objective:     Visit Vitals   ??? BP 120/74   ??? Pulse 65   ??? Temp 98 ??F (36.7 ??C) (Oral)   ??? Resp 20   ??? Ht 6\' 3"  (1.905 m)   ??? Wt 333 lb 9.6 oz (151.3 kg)   ??? SpO2 96%   ??? BMI 41.7 kg/m2        Physical Exam   Constitutional: He appears well-developed and well-nourished. No distress.   HENT:   Head: Normocephalic and atraumatic.    Cardiovascular: Normal rate, regular rhythm and normal heart sounds.    Pulmonary/Chest: Effort normal and breath sounds normal.   Abdominal: Soft. Bowel sounds are normal.   Musculoskeletal:        Left hip: Normal. He exhibits normal range of motion, normal strength and no tenderness.        Thoracic back: Normal.        Lumbar back: He exhibits normal range of motion, no tenderness, no swelling and no deformity.   No tenderness tenderness over his left sciatic notch on exam. He has a full range of motion, denies pain with flexion or extension of the back.    Nursing note and vitals reviewed.      Pertinent Labs/Studies:      Assessment and orders:   Myquan was seen today for hip pain, medication refill and referral request.    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  Will refill Norco, patient interested in starting twice daily dosing, but patient already has pain contract with other provider.  PMP reviewed and appropriate.  Patient will continue with PT at this time, on follow up will discuss possible Ortho referral, possibly interested in injections.  Need to Avoid Nsaids given cardiac history.  -     HYDROcodone-acetaminophen (NORCO) 10-325 mg tablet; Take 1 Tab by mouth nightly. Max Daily Amount: 1 Tab.    Essential hypertension: Refill  -     potassium chloride SR (KLOR-CON 10) 10 mEq tablet; Take 1 Tab by mouth daily.    Screening for colon cancer:  Screening  -     OCCULT BLOOD, IMMUNOASSAY (FIT)    Obstructive sleep apnea: Referral for re-evaluation  -     REFERRAL TO SLEEP STUDIES      Follow-up Disposition:  Return in about 1 month (around 10/16/2015) for Pain medication refill, follow up back pain.    I have reviewed patient medical and social history and medications.  I have reviewed pertinent labs results and other data. I have discussed the  diagnosis with the patient and the intended plan as seen in the above orders. The  patient has received an after-visit summary and questions were answered concerning future plans. I have discussed medication side effects and warnings with the patient as well.    Altamese Cabal, MD  Resident [facility]  09/16/15    Patient discussed with Dr. Sanda Klein, Attending Physician

## 2015-09-16 NOTE — Progress Notes (Signed)
Health Maintenance Due   Topic Date Due   ??? Hepatitis C Screening  October 21, 1950   ??? FOBT Q 1 YEAR AGE 67-75  12/22/1998   ??? Pneumococcal 65+ Low/Medium Risk (1 of 2 - PCV13) 12/21/2013

## 2015-09-16 NOTE — Progress Notes (Signed)
I reviewed with the resident the medical history and the resident's findings on the physical examination.  I discussed with the resident the patient's diagnosis and concur with the plan.

## 2015-09-17 NOTE — Telephone Encounter (Signed)
Called patient to scheduled new patient appointment per referral received from [facility], patient decline to schedule appointment at this time and will call back to schedule, call back and left message on voicemail of our contact information.

## 2015-09-23 DIAGNOSIS — D126 Benign neoplasm of colon, unspecified: Secondary | ICD-10-CM | POA: Insufficient documentation

## 2015-10-02 NOTE — Telephone Encounter (Signed)
Patient received notice that his results are back from where they tested the polyps they found during his colonoscopy in an email link.  He has tried to access this but is unable to do so.  He had [facility] done [facility].  Can we find out the results and let him know? Please advise at [phone].

## 2015-10-03 NOTE — Telephone Encounter (Signed)
Please advise. Results are scanned in under Media for 09/24/15.

## 2015-10-06 NOTE — Telephone Encounter (Signed)
Reviewed results, he had 2 tubular adenomas. Different GI doctors have different ways they manage this in terms of when he needs to follow-up again, so it would be best if he called his GI doctor to discuss this. Thanks.

## 2015-10-06 NOTE — Telephone Encounter (Signed)
Called patient's GI doctor, Dr. Ruffin FrederickKhalid, and left message to let them know to call patient and advise of colonoscopy results. Called patient to let him know he should be receiving a call from Dr. Romelle StarcherKhalid's office. Patient expressed understanding.

## 2015-10-17 NOTE — Telephone Encounter (Signed)
Please call pt about his hip. He needs a referral for his limping all the time. Please call him.

## 2015-10-17 NOTE — Telephone Encounter (Signed)
Patient requesting a referral for left hip pain. He states that he is limping all of the time. Advised patient that he will need an appointment. Patient states that he will be out of town all of next week and asked that I put a message out. Please advise, thank you.

## 2015-10-20 ENCOUNTER — Telehealth

## 2015-10-20 NOTE — Telephone Encounter (Signed)
Referral to Ortho for evaluation.  Will leave message for patient as is out of town.    Altamese Cabal, MD  Resident [facility]  10/20/15

## 2015-10-23 NOTE — Telephone Encounter (Signed)
Got a hold of patient and discussed referral to Ortho surgery for evaluation of back/hip pain.  Patient states that hip pain has mildly improved, but is interested in evaluation and possible injections for pain relief.  Patient currently not interested in surgery.  Patient returning from vacation, will call office if need to get contact information for referral site.    Altamese Cabal, MD  Resident [facility]  10/23/15

## 2015-11-07 ENCOUNTER — Encounter

## 2015-11-07 NOTE — Telephone Encounter (Signed)
Last office visit 09/16/2015    Mr Elijah [name] wants med up to twice a day, he would not make an appointment.  He is aware of Dr Mliss SaxSpence being out of office this pm

## 2015-11-07 NOTE — Telephone Encounter (Signed)
LOV on 09/16/15. Patient is due for a urine drug screen. [facility] on file, please advise. Thank you.

## 2015-11-07 NOTE — Telephone Encounter (Signed)
Patient called.  He has been referred to see an orthopedist at [facility].  Before they will agree to see him, they want him to get a MRI.  He would like to get [facility].

## 2015-11-08 ENCOUNTER — Encounter

## 2015-11-08 NOTE — Progress Notes (Signed)
See TC.

## 2015-11-10 ENCOUNTER — Encounter

## 2015-11-10 MED ORDER — HYDROCODONE-ACETAMINOPHEN 10 MG-325 MG TAB
10-325 mg | ORAL_TABLET | Freq: Two times a day (BID) | ORAL | 0 refills | Status: DC | PRN
Start: 2015-11-10 — End: 2016-04-07

## 2015-11-11 ENCOUNTER — Encounter

## 2015-11-11 MED ORDER — MEFLOQUINE 250 MG TAB
250 mg | ORAL_TABLET | ORAL | 0 refills | Status: DC
Start: 2015-11-11 — End: 2016-07-30

## 2015-11-11 NOTE — Telephone Encounter (Signed)
Patient is leaving to travel out of the country to [place] and [place]. He will be out of town on 11/14/15 but will not leave the [place] until 11/29/15. Please advise. Thank you.

## 2015-11-11 NOTE — Telephone Encounter (Signed)
Patient would like a prescription for the prevention of Malaria.  Patient is going out of the country.  Please advise at [phone].

## 2015-11-11 NOTE — Addendum Note (Signed)
Addended by: Jacelyn GripSPENCE, Isobelle Tuckett N on: 11/11/2015 12:51 PM      Modules accepted: Orders

## 2015-11-11 NOTE — Telephone Encounter (Signed)
Patient states that he will be in [place] and [place] for a total of 2 weeks.

## 2015-11-12 ENCOUNTER — Telehealth

## 2015-11-12 ENCOUNTER — Encounter

## 2015-11-12 NOTE — Telephone Encounter (Signed)
ASAP appointment with Dr. Gasper LloydSabharwal requested

## 2015-11-12 NOTE — Telephone Encounter (Signed)
Wants a referral to have feet checked for burning  He can be reached at [phone]

## 2015-11-12 NOTE — Telephone Encounter (Signed)
Patient needs to have a nuclear stress test done.  Please order.  His brother died at age 67 of a heart attack.  Please advise at [phone].  Patient is requesting Dr. Gasper LloydSabharwal.

## 2015-11-13 ENCOUNTER — Encounter

## 2015-11-13 NOTE — Telephone Encounter (Signed)
Before I put in a referral for him to address his burning feet we need to address back issues first as that can cause feet to burn.   Dr. Mliss SaxSpence

## 2015-11-13 NOTE — Telephone Encounter (Signed)
Patient scheduled to have a MRI done at [facility] in [place] on November 17, 2015 at 1:30.  Patient informed by phone and order faxed.

## 2015-11-14 ENCOUNTER — Ambulatory Visit: Payer: MEDICARE | Primary: Family Medicine

## 2015-11-17 ENCOUNTER — Encounter

## 2015-11-17 NOTE — Progress Notes (Signed)
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  In an effort to keep him from being exposed to contrast more than once, I  okayed the order and our referral secretary is sending it to them.  Dr. Mliss SaxSpence

## 2015-11-19 ENCOUNTER — Ambulatory Visit: Admit: 2015-11-19 | Discharge: 2015-11-25 | Payer: MEDICARE | Attending: Specialist | Primary: Family Medicine

## 2015-11-19 NOTE — Progress Notes (Deleted)
Ned GraceHoward B Kirschbaum     [birth year]       Vipal K. Gasper LloydSabharwal MD, [facility]  Date of Visit-11/19/2015   PCP is Jacelyn GripSteven N Spence, MD   [facility]  [facility]  HPI:  Ned GraceHoward B Schatz is a 65 y.o. male     Denies chest pain, edema, syncope or shortness of breath at rest, has no tachycardia, palpitations or sense of arrhythmia.      EKG:     Assessment/Plan:            Impression: No diagnosis found.   Cardiac History:   No specialty comments available.     ROS-except as noted above.. A complete cardiac and respiratory are reviewed and negative except as above ; Resp-denies wheezing  or productive cough,. Const- No unusual weight loss or fever; Neuro-no recent seizure or CVA ; GI- No BRBPR, abdom pain, bloating ; GU- no  hematuria   see supplement sheet, initialed and to be scanned by staff  Past Medical History:   Diagnosis Date   ??? Morbid obesity due to excess calories (HCC) 04/24/2015      Social Hx= reports that he has quit smoking. His smoking use included Cigarettes. He does not have any smokeless tobacco history on file. He reports that he does not drink alcohol or use illicit drugs.     Exam and Labs:  There were no vitals taken for this visit.   Constitutional:  NAD, comfortable  Head: NC,AT. Eyes: No scleral icterus. Neck:  Neck supple. No JVD present.Throat: moist mucous membranes.  Chest: Effort normal & normal respiratory excursion .Neurological: alert, conversant and oriented . Skin: Skin is not cold. No obvious systemic rash noted. Not diaphoretic. No erythema. Psychiatric:  Grossly normal mood and affect.  Behavior appears normal. Extremities:  no clubbing or cyanosis. Abdomen: non distended    Lungs:breath sounds normal. No stridor. distress, wheezes or  Rales.  Heart:    {findings; exam heart:315510::"normal rate, regular rhythm, normal S1, S2, no murmurs, rubs, clicks or gallops"} , PMI non displaced.    Edema: Edema is none.  Lab Results    Component Value Date/Time    Cholesterol, Total 147 06/26/2015 11:51 AM     Results for orders placed or performed in visit on 06/24/15   NMR LIPOPROFILE     Status: Abnormal   Result Value Ref Range Status    LDL-P 1076 (H) <1000 nmol/L Final     Comment:                           Low                   < 1000                            Moderate         1000 - 1299                            Borderline-High  1300 - 1599                            High             1600 - 2000  Very High             > 2000      LDL-C 70 0 - 99 mg/dL Final     Comment:                           Optimal               <  100                            Above optimal     100 -  129                            Borderline        130 -  159                            High              160 -  189                            Very high             >  189  LDL-C is inaccurate if patient is non-fasting.      HDL-C 50 >39 mg/dL Final    Triglycerides 137 0 - 149 mg/dL Final    Cholesterol, Total 147 100 - 199 mg/dL Final    HDL-P (Total) 32.4 >=30.5 umol/L Final    Small LDL-P 562 (H) <=527 nmol/L Final    LDL size 20.2 >20.5 nm Final     Comment:  ----------------------------------------------------------                   ** INTERPRETATIVE INFORMATION**                   PARTICLE CONCENTRATION AND SIZE                      <--Lower CVD Risk   Higher CVD Risk-->    LDL AND HDL PARTICLES   Percentile in Reference Population    HDL-P (total)        High     75th    50th    25th   Low                         >34.9    34.9    30.5    26.7   <26.7    Small LDL-P          Low      25th    50th    75th   High                         <117     117     527     839    >839    LDL Size   <-Large (Pattern A)->    <-Small (Pattern B)->                      23.0    20.6           20.5  19.0   ----------------------------------------------------------  Small LDL-P and LDL Size are associated with CVD risk, but not after   LDL-P is taken into account.  These assays were developed and their performance characteristics  determined by [employer]. These assays have not been cleared by the  [place] Food and Drug Administration. The clinical utility o  f these  laboratory values have not been fully established.      LP-IR SCORE 39 <=45 Final     Comment: INSULIN RESISTANCE MARKER      <--Insulin Sensitive    Insulin Resistant-->             Percentile in Reference Population  Insulin Resistance Score  LP-IR Score   Low   25th   50th   75th   High                <27   27     45     63     >63  LP-IR Score is inaccurate if patient is non-fasting.  The LP-IR score is a laboratory developed index that has been  associated with insulin resistance and diabetes risk and should be  used as one component of a physician's clinical assessment. The  LP-IR score listed above has not been cleared by the [place] Food and  Drug Administration.      Narrative    Performed at:  [address] [place]  [address] [place], [place]  [id number]  Lab Director: Mila Homer MD, Phone:  [phone]      Lab Results   Component Value Date/Time    Sodium 144 06/26/2015 11:51 AM    Potassium 4.5 06/26/2015 11:51 AM    Chloride 108 06/26/2015 11:51 AM    CO2 20 06/26/2015 11:51 AM    Glucose 83 06/26/2015 11:51 AM    BUN 18 06/26/2015 11:51 AM    Creatinine 1.03 06/26/2015 11:51 AM    BUN/Creatinine ratio 17 06/26/2015 11:51 AM    GFR est AA 87 06/26/2015 11:51 AM    GFR est non-AA 75 06/26/2015 11:51 AM    Calcium 8.7 06/26/2015 11:51 AM      Wt Readings from Last 3 Encounters:   09/16/15 333 lb 9.6 oz (151.3 kg)   08/14/15 345 lb (156.5 kg)   07/30/15 344 lb (156 kg)      BP Readings from Last 3 Encounters:   09/16/15 120/74   08/14/15 126/82   07/30/15 121/74        Current Outpatient Prescriptions   Medication Sig   ??? mefloquine (LARIAM) 250 mg tablet Take weekly starting today and until 4 weeks after you return from [place]. Take with food and at least 8 ounces of  water.  Indications: CHLORO-RESISTANT FALCIPARUM MALARIA PREVENTION   ??? HYDROcodone-acetaminophen (NORCO) 10-325 mg tablet Take 1 Tab by mouth every twelve (12) hours as needed for Pain. Max Daily Amount: 2 Tabs.   ??? potassium chloride SR (KLOR-CON 10) 10 mEq tablet Take 1 Tab by mouth daily.   ??? amLODIPine (NORVASC) 5 mg tablet Take 1 Tab by mouth daily. Indications: hypertension   ??? lisinopril (PRINIVIL, ZESTRIL) 20 mg tablet Take 1 Tab by mouth daily.   ??? nebivolol (BYSTOLIC) 10 mg tablet Take 1 Tab by mouth daily.   ??? atorvastatin (LIPITOR) 40 mg tablet Take 1 Tab by mouth daily.   ??? aspirin (ASPIRIN) 325 mg tablet Take 1 Tab by mouth daily.   ??? Flaxseed Oil oil 1  Tab by Does Not Apply route two (2) times a day.   ??? DOCOSAHEXANOIC ACID/EPA (FISH OIL PO) Take 2 Caps by mouth two (2) times a day.     No current facility-administered medications for this visit.       Impression see above.    Written by Reinaldo MeekerBryan Talbert, [place], as dictated by Mathews RobinsonsVipal Sabharwal, MD.

## 2015-11-19 NOTE — Progress Notes (Signed)
We had been called to see patient in office for appointment  Patient thought he was coming just for stress test  He was understandably upset with misunderstanding   He became upset with staff  I brought patient back in room and offered to see patient as office visit  But he declined to be seen and was angry over not having a stress test scheduled  I offered to schedule it but he said he was dissatisfied and would not agree to having testing scheduled  He will discuss with his PCP  No charge visit

## 2015-11-20 NOTE — Telephone Encounter (Signed)
Duplicate order -to ignore

## 2015-11-20 NOTE — Telephone Encounter (Signed)
I spoke with the patient by phone.  He is now back in [place].  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  Either one of these can cause his feet to hurt or he can also have a hereditary neuropathy like his mother had.    I suggested that he consider Lyrica therapy.  This should be stronger, better and have fewer side effects and gabapentin for him.  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  He will call me back after that.    I also suggested that he consider seeing a neurosurgeon at [facility] to sort out his spine disease.  If they felt necessary they could do a nerve conduction velocity study-EMG to clearly delineate the etiology of his burning foot pain at night.  He is not resting well at night and Lyrica would be the best for this.  I do not think this pain is arterial in nature.    I noted that he did not pick up his hydrocodone prescription.  Dr. Mliss Sax

## 2015-11-20 NOTE — Telephone Encounter (Signed)
Patient called wanting to know if he could get a test done on the arteries in his leg to see if he has sufficient blood flow in them.  He wonders if this could be causing his feet to burn.  The burning in his feet causes him to have a hard time at night sleeping.  Please advise at [phone]

## 2015-11-21 NOTE — Telephone Encounter (Signed)
Patient called back and requested that someone send this in today and for a high dose.  He states that this is for the pain in his feet.  He is also requesting that this be sent in today.

## 2015-11-21 NOTE — Telephone Encounter (Signed)
Patient called to request a [facility] for [facility] [place] phone number is [phone] (selected in medications).  Patient states that he talked to Dr. Mliss SaxSpence about this.

## 2015-11-24 MED ORDER — PREGABALIN 200 MG CAP
200 mg | ORAL_CAPSULE | ORAL | 0 refills | Status: DC
Start: 2015-11-24 — End: 2015-12-19

## 2015-11-27 ENCOUNTER — Encounter: Attending: Specialist | Primary: Family Medicine

## 2015-12-19 MED ORDER — PREGABALIN 200 MG CAP
200 mg | ORAL_CAPSULE | ORAL | 3 refills | Status: DC
Start: 2015-12-19 — End: 2015-12-30

## 2015-12-19 NOTE — Telephone Encounter (Signed)
Last office visit on 09/16/15 and last labs on 06/26/15. Please advise, thank you.

## 2015-12-23 ENCOUNTER — Encounter: Attending: Family Medicine | Primary: Family Medicine

## 2015-12-24 NOTE — Telephone Encounter (Signed)
Patient saw orthopedist at [facility] yesterday.  That doctor recommended that he get an epidural injection.  Patient wants to get that done in [place].  Please advise a [phone]

## 2015-12-25 ENCOUNTER — Encounter

## 2015-12-25 NOTE — Progress Notes (Signed)
Consult request for Dr. Selena BattenKim put in to the system.  Patient understands that this is a minimum of 2 visits and he will not get epidurals first visit.  He was also instructed to bring all of his medical records from [facility] back specialist so that no tests would need to be duplicated.  Dr. Mliss SaxSpence

## 2015-12-25 NOTE — Telephone Encounter (Signed)
Phone call to patient. Informed patient per Dr. Mliss SaxSpence: He will need someone to take him to this procedure and have to see back specialist in [place] first. ??I can't just order an epidural. ??Spine specialist have to arrange that. ??Ask if he [facility] to [facility] [place] back specialist here.       Patient verbalized understanding and stated that [place] would [facility] [place] [facility]. Please put in referral for him to see Dr. Selena BattenKim.     Advised patient per Dr. Mliss SaxSpence to make sure he takes all of his medical records with him from his previous spine doctor so they will not have to repeat anything that he has already had.

## 2015-12-30 MED ORDER — PREGABALIN 200 MG CAP
200 mg | ORAL_CAPSULE | ORAL | 0 refills | Status: DC
Start: 2015-12-30 — End: 2016-04-07

## 2015-12-30 NOTE — Telephone Encounter (Signed)
Last office visit 09/16/2015. Please advise since Dr. Mliss SaxSpence is out of the office this week. Thank you.

## 2015-12-30 NOTE — Telephone Encounter (Signed)
[facility]  Last office visit was 09/16/15.

## 2015-12-31 NOTE — Telephone Encounter (Signed)
He can increase his lyrica to TID for the neuropathic pain.     Elijah DoloresKimberly A Dynesha Woolen, MD

## 2015-12-31 NOTE — Telephone Encounter (Signed)
Appointment scheduled and patient made aware.

## 2015-12-31 NOTE — Telephone Encounter (Signed)
Phone call to patient, no answer. Detailed message left on answering machine informing patient per Dr. Dorita FrayHlavac:       Appears that patient has a CSC with Dr. Mliss SaxSpence therefore I will not prescribe him any further pain medication. He can try melatonin for sleep or an NSAID such as Aleve for pain.

## 2015-12-31 NOTE — Telephone Encounter (Signed)
Advised patient that he can take Lyrica TID for neuropathy. He states that he did not get any sleep last night. He says his left hip feels like there is a knife in it, pain is worse at night. What do you recommend to help with the pain and sleep? (Routing comment)     Appears that patient has a CSC with Dr. Mliss SaxSpence therefore I will not prescribe him any further pain medication. He can try melatonin for sleep or an NSAID such as Aleve for pain.     Bonne DoloresKimberly A Jayshawn Colston, MD

## 2015-12-31 NOTE — Telephone Encounter (Signed)
Patient called and stated that he is having problems with his feet burning and hip hurting.  He has an appointment with the orthopedist for next week, but wants to know if he can get something to help until then.  Please advise at   [phone].

## 2016-01-19 NOTE — Progress Notes (Signed)
Called to see the patient had had his epidural injections yet.  The answer is no because she is having to defer them for 2 weeks while he is out of the country in [place]  He is still having sleepless nights due to the severe radiculopathy.  Lyrica has helped minimally.  He will see me after he has had his epidurals.  He has a dilemma with pain medication due to his flight status.

## 2016-03-19 ENCOUNTER — Telehealth

## 2016-03-19 ENCOUNTER — Telehealth: Payer: Self-pay | Admitting: Cardiology

## 2016-03-19 NOTE — Telephone Encounter (Signed)
Spoke with patient who is reporting having some SOB X 2 to 3 days with activity.  Has CPAP when sleeping and doesn't feel SOB then.  The SOB is not all the time and he denies any CP and/or edema.  He states he does not know if he is just SOB because he is out of shape or because he is actually SOB.  He is concerned because his brother "dropped dead about a yr ago while he had been SOB"  Pt is requesting to have another stress test like he has had in the past.  Advised I will have to review information with Dr Marlou Porch prior to any testing being ordered.  Advised if he continues to be SOB or it gets worse or starts to have CP to report to the closest ED as he lives in [place]  Pt states understanding.

## 2016-03-19 NOTE — Telephone Encounter (Signed)
Nuclear Stress test ordered for next available. Will ask PSR to help schedule.

## 2016-03-19 NOTE — Telephone Encounter (Signed)
Spoke with Dr. Sanda KleinEsquivel (Dr. Mliss SaxSpence is out of the office) re: patient's request for a stress test.  Advised patient to call cardiology re: testing.  He states he called the cardiology office and is awaiting a call back from them.

## 2016-03-19 NOTE — Telephone Encounter (Signed)
Elijah [name] wants to have a nuclear stress testing today.  He is aware of Dr Mliss SaxSpence being out of office but feels this testing should be done today based on history  Please call him at [phone]

## 2016-03-19 NOTE — Telephone Encounter (Signed)
Patient said he thinks hes starting to have shortness of breath so he would like to know if he can have a stress test done   Phone: [phone]

## 2016-03-19 NOTE — Telephone Encounter (Signed)
Verified patient with two types of identifiers.    Patient is requesting stress test. States that he "thinks he is having shortness of breath, he is not sure. That my brother dropped dead because he didn't have a stress test, he was dead of a heart attack in 4 hours."   Patient stated that he was a bit of a hypochondriac, but concerned because of his brother's history. Wanted to come into office today for stress test. Informed him that I would have to pass his request onto Dr. Gasper LloydSabharwal and we would have to wait for his recommendation.

## 2016-03-19 NOTE — Telephone Encounter (Signed)
New Message  Pt c/o Shortness Of Breath: STAT if SOB developed within the last 24 hours or pt is noticeably SOB on the phone  1. Are you currently SOB (can you hear that pt is SOB on the phone)? No  2. How long have you been experiencing SOB? Couple days  3. Are you SOB when sitting or when up moving around? Moving Around  4. Are you currently experiencing any other symptoms? Tired  Pt voiced wanting a stress test due to brother passed out and died from heart attach and doesn't want a repeat with him.  Pt voiced wanting nurse to return his call.  Please f/u

## 2016-03-25 NOTE — Telephone Encounter (Signed)
Verified patient with two types of identifiers.    Instructed patient:  Not to eat or drink anything, except water at least 2 hours prior to test. No tobacco products at least 6 hours prior to test. No caffeine products for 12-24 hours prior to test. He is scheduled for a 2-day test, so allow about 2 hours per visit.

## 2016-03-28 NOTE — Telephone Encounter (Signed)
Agree with him seeking medical attention. Thanks Candee Furbish, MD

## 2016-03-30 NOTE — Telephone Encounter (Signed)
Ankles are swollen x 3 days, please [facility] phone# [phone]

## 2016-03-30 NOTE — Telephone Encounter (Signed)
Spoke with patient and informed him per Dr. Mliss SaxSpence:     "Cannot treat pt for edema without exam.   See me when he is here [facility] "    Patient verbalized understanding and declined making an appointment at this time.

## 2016-04-01 NOTE — Telephone Encounter (Signed)
Pt has not called back with further complaints or concerns.  He has not made a follow up appt either.  Last seen 3/16 and was to have had a myoview which he cancelled.

## 2016-04-07 ENCOUNTER — Institutional Professional Consult (permissible substitution): Primary: Family Medicine

## 2016-04-07 ENCOUNTER — Ambulatory Visit: Admit: 2016-04-07 | Discharge: 2016-04-08 | Payer: MEDICARE | Attending: Family Medicine | Primary: Family Medicine

## 2016-04-07 ENCOUNTER — Institutional Professional Consult (permissible substitution): Admit: 2016-04-08 | Discharge: 2016-04-08 | Payer: MEDICARE | Primary: Family Medicine

## 2016-04-07 DIAGNOSIS — I2510 Atherosclerotic heart disease of native coronary artery without angina pectoris: Secondary | ICD-10-CM

## 2016-04-07 DIAGNOSIS — I10 Essential (primary) hypertension: Secondary | ICD-10-CM

## 2016-04-07 MED ORDER — NUCLEAR MEDICINE ISOTOPE
Freq: Once | 0 refills | Status: AC
Start: 2016-04-07 — End: 2016-04-07

## 2016-04-07 MED ORDER — TRIAMCINOLONE ACETONIDE 0.1 % TOPICAL CREAM
0.1 % | Freq: Two times a day (BID) | CUTANEOUS | 3 refills | Status: DC
Start: 2016-04-07 — End: 2016-09-03

## 2016-04-07 MED ORDER — PREGABALIN 225 MG CAP
225 mg | ORAL_CAPSULE | ORAL | 5 refills | Status: DC
Start: 2016-04-07 — End: 2016-05-25

## 2016-04-07 MED ORDER — REGADENOSON 0.4 MG/5 ML IV SYRINGE
0.4 mg/5 mL | Freq: Once | INTRAVENOUS | 0 refills | Status: AC
Start: 2016-04-07 — End: 2016-04-07

## 2016-04-07 NOTE — Patient Instructions (Addendum)
Leg and Ankle Edema: Care Instructions  Your Care Instructions  Swelling in the legs, ankles, and feet is called edema. It is common after you sit or stand for a while. Long plane flights or car rides often cause swelling in the legs and feet. You may also have swelling if you have to stand for long periods of time at your job. Problems with the veins in the legs (varicose veins) and changes in hormones can also cause swelling. Sometimes the swelling in the ankles and feet is caused by a more serious problem, such as heart failure, infection, blood clots, or liver or kidney disease.  Follow-up care is a key part of your treatment and safety. Be sure to make and go to all appointments, and call your doctor if you are having problems. It's also a good idea to know your test results and keep a list of the medicines you take.  How can you care for yourself at home?  ?? If your doctor gave you medicine, take it as prescribed. Call your doctor if you think you are having a problem with your medicine.  ?? Whenever you are resting, raise your legs up. Try to keep the swollen area higher than the level of your heart.  ?? Take breaks from standing or sitting in one position.  ?? Walk around to increase the blood flow in your lower legs.  ?? Move your feet and ankles often while you stand, or tighten and relax your leg muscles.  ?? Wear support stockings. Put them on in the morning, before swelling gets worse.  ?? Eat a balanced diet. Lose weight if you need to.  ?? Limit the amount of salt (sodium) in your diet. Salt holds fluid in the body and may increase swelling.  When should you call for help?  Call 911 anytime you think you may need emergency care. For example, call if:  ? ?? You have symptoms of a blood clot in your lung (called a pulmonary embolism). These may include:  ?? Sudden chest pain.  ?? Trouble breathing.  ?? Coughing up blood.   ?Call your doctor now or seek immediate medical care if:   ? ?? You have signs of a blood clot, such as:  ?? Pain in your calf, back of the knee, thigh, or groin.  ?? Redness and swelling in your leg or groin.   ? ?? You have symptoms of infection, such as:  ?? Increased pain, swelling, warmth, or redness.  ?? Red streaks or pus.  ?? A fever.   ?Watch closely for changes in your health, and be sure to contact your doctor if:  ? ?? Your swelling is getting worse.   ? ?? You have new or worsening pain in your legs.   ? ?? You do not get better as expected.   Where can you learn more?  Go to http://www.healthwise.net/GoodHelpConnections.  Enter N696 in the search box to learn more about "Leg and Ankle Edema: Care Instructions."  Current as of: June 16, 2015  Content Version: 11.4  ?? 2006-2017 Healthwise, Incorporated. Care instructions adapted under license by [employer] (which disclaims liability or warranty for this information). If you have questions about a medical condition or this instruction, always ask your healthcare professional. Healthwise, Incorporated disclaims any warranty or liability for your use of this information.

## 2016-04-07 NOTE — Progress Notes (Signed)
See scanned document.      Ordering Doctor:Dr.Sabharwal  Reading Doctor:Dr. Sabharwal    Patient tolerated procedure well.

## 2016-04-07 NOTE — Progress Notes (Signed)
Progress Note    Patient: Elijah [name] MRN: [id number]  SSN: [id number]    Date of Birth: [birth year]  Age: 66 y.o.  Sex: male        Chief Complaint   Patient presents with   ??? Ankle swelling     Bilateral -- Right is worse    ??? Other     Neuropathy -- Lyrica not helping          Subjective:     Encounter Diagnoses   Name Primary?   ??? Essential hypertension;  BP Readings from Last 3 Encounters:   04/07/16 119/68   09/16/15 120/74   08/14/15 126/82     The patient reports:  taking medications as instructed, no medication side effects noted, no TIA's, no chest pain on exertion, no dyspnea on exertion, no swelling of ankles.     Lab Results   Component Value Date/Time    Sodium 142 04/07/2016 04:44 PM    Potassium 4.6 04/07/2016 04:44 PM    Chloride 104 04/07/2016 04:44 PM    CO2 22 04/07/2016 04:44 PM    Glucose 74 04/07/2016 04:44 PM    BUN 16 04/07/2016 04:44 PM    Creatinine 1.12 04/07/2016 04:44 PM    BUN/Creatinine ratio 14 04/07/2016 04:44 PM    GFR est AA 78 04/07/2016 04:44 PM    GFR est non-AA 68 04/07/2016 04:44 PM    Calcium 9.0 04/07/2016 04:44 PM    Bilirubin, total 0.6 04/07/2016 04:44 PM    AST (SGOT) 24 04/07/2016 04:44 PM    Alk. phosphatase 64 04/07/2016 04:44 PM    Protein, total 7.0 04/07/2016 04:44 PM    Albumin 4.1 04/07/2016 04:44 PM    A-G Ratio 1.4 04/07/2016 04:44 PM    ALT (SGPT) 25 04/07/2016 04:44 PM     Our goal is to normalize the blood pressure to decrease the risks of strokes and heart attacks. The patient is in agreement with the plan.     Yes   ??? Pure hypercholesterolemia:  Cardiovascular risks for him are: LDL goal is under 80  existing CAD  hypertension  hyperlipidemia.   Currently he takes Lipitor (atorvastatin) , 40 mg  Lab Results   Component Value Date/Time    Cholesterol, Total 147 06/26/2015 11:51 AM     Lab Results   Component Value Date/Time    ALT (SGPT) 25 04/07/2016 04:44 PM    AST (SGOT) 24 04/07/2016 04:44 PM    Alk. phosphatase 64 04/07/2016 04:44 PM     Bilirubin, total 0.6 04/07/2016 04:44 PM      Myalgias: No   Fatigue: No   Other side effects: no  Wt Readings from Last 3 Encounters:   04/07/16 (!) 352 lb 6.4 oz (159.8 kg)   09/16/15 333 lb 9.6 oz (151.3 kg)   08/14/15 345 lb (156.5 kg)     The patient is aware of our goal to reduce or eliminate the long term problems (such as strokes and heart attacks) related to poorly controlled hyperlipidemia.        ??? Coronary artery disease involving native coronary artery of native heart without angina pectoris:  No chest pain, DOE or SOB. No PND or orthopnea.        ??? OSA on CPAP: Because of his severe chronic low back pain is not sleeping well.  He has not been to get his repeat sleep apnea test because he cannot sleep through the night.  Hopefully  by increasing and changing his dose of Lyrica we can help with the pain so that he can get his sleep apnea reevaluated.  Related to        ??? Neuropathy: Degenerative disc disease.  He may also have a component of hereditary neuropathy.  His mother had hereditary neuropathy that was quite severe.        ??? Allergic contact dermatitis due to adhesives: He has been using a Quell nerve stimulator on his upper lateral calf.  He has developed a contact dermatitis from the pads underneath the coil.  He will be treated with triamcinolone cream so that he can continue to use a device since it gives him some relief.        ??? DDD (degenerative disc disease), lumbar: Severe.  We discussed the next step of epidurals.  Told him results were variable but shoulder surgery at the best thing that he could do.        ??? Chronic pain syndrome: This is due to his chronic back pain with radiculopathy and neuropathy.        ??? Family history of prostate cancer in father: Father had prostate cancer.        ??? Localized edema: This is a new problem.  For the last several weeks he has noticed increasing edema in his lower legs.  The only salt in his diet  is that from canned soup.  He is due to have a stress test today.  It will be helpful to see his ejection fraction and heart function.  His lungs are clear and he has no symptoms of congestive heart failure.  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      Current and past medical information:    Current Medications after this visit::   Current Outpatient Prescriptions   Medication Sig   ??? pregabalin (LYRICA) 225 mg capsule Take one capsule every 12 hours as needed for neuropathy  Indications: NEUROPATHIC PAIN ASSOCIATED WITH SPINAL CORD INJURY, ddd, lumbar   ??? triamcinolone acetonide (KENALOG) 0.1 % topical cream Apply  to affected area two (2) times a day. use thin layer   ??? potassium chloride SR (KLOR-CON 10) 10 mEq tablet Take 1 Tab by mouth daily.   ??? amLODIPine (NORVASC) 5 mg tablet Take 1 Tab by mouth daily. Indications: hypertension   ??? lisinopril (PRINIVIL, ZESTRIL) 20 mg tablet Take 1 Tab by mouth daily.   ??? nebivolol (BYSTOLIC) 10 mg tablet Take 1 Tab by mouth daily.   ??? atorvastatin (LIPITOR) 40 mg tablet Take 1 Tab by mouth daily.   ??? aspirin (ASPIRIN) 325 mg tablet Take 1 Tab by mouth daily.   ??? Flaxseed Oil oil 1 Tab by Does Not Apply route two (2) times a day.   ??? DOCOSAHEXANOIC ACID/EPA (FISH OIL PO) Take 2 Caps by mouth two (2) times a day.   ??? nuclear medicine isotope 2 Each by Does Not Apply route once for 1 dose. Radiopharmaceutical administered:Tc25mTc Sestamibi (Cardiolite)      Amount administered stress: 30.4 mCi   ??? regadenoson (REGADENSON) 0.4 mg/5 mL syrg injection 5 mL by IntraVENous route once for 1 dose. Indications: MYOCARDIAL PERFUSION IMAGING ADJUNCT   ??? mefloquine (LARIAM) 250 mg tablet Take weekly starting today and until 4 [place] Take with food and at least 8 ounces of water.  Indications: CHLORO-RESISTANT FALCIPARUM MALARIA PREVENTION     No current facility-administered medications for this visit.  Patient Active Problem List    Diagnosis Date Noted    ??? Tubular adenoma of colon 09/23/2015   ??? Morbid obesity due to excess calories ([place]) 04/24/2015   ??? Neuropathy 02/21/2015   ??? Essential hypertension 02/21/2015   ??? HLD (hyperlipidemia) 02/21/2015   ??? CAD (coronary artery disease) 02/21/2015   ??? OSA on CPAP 02/21/2015       Past Medical History:   Diagnosis Date   ??? Morbid obesity due to excess calories ([place]) 04/24/2015       Allergies   Allergen Reactions   ??? Metoprolol Other (comments)     Patient states, made me feel horrible.       Past Surgical History:   Procedure Laterality Date   ??? HX HIP REPLACEMENT Bilateral        Social History     Social History   ??? Marital status: MARRIED     Spouse name: N/A   ??? Number of children: N/A   ??? Years of education: N/A     Social History Main Topics   ??? Smoking status: Former Smoker     Types: Cigarettes   ??? Smokeless tobacco: Not on file   ??? Alcohol use No      Comment: recently has stopped   ??? Drug use: No   ??? Sexual activity: Not on file     Other Topics Concern   ??? Not on file     Social History Narrative       Review of Systems   Constitutional: Negative.  Negative for chills, fever, malaise/fatigue and weight loss.   HENT: Negative.  Negative for hearing loss.    Eyes: Negative.  Negative for blurred vision and double vision.   Respiratory: Negative.  Negative for cough, hemoptysis, sputum production and shortness of breath.    Cardiovascular: Positive for leg swelling. Negative for chest pain, palpitations and orthopnea.   Gastrointestinal: Negative.  Negative for abdominal pain, blood in stool, heartburn, nausea and vomiting.   Genitourinary: Negative.  Negative for dysuria, frequency and urgency.   Musculoskeletal: Negative.  Negative for back pain, myalgias and neck pain.   Skin: Positive for rash.        He has tinea pedis as well as contact dermatitis from the quell unit.   Neurological: Negative.  Negative for dizziness, tingling, tremors, weakness and headaches.   Endo/Heme/Allergies: Negative.     Psychiatric/Behavioral: Negative.  Negative for depression.        Objective:     Vitals:    04/07/16 1043   BP: 119/68   Pulse: (!) 58   Resp: 20   Temp: 97.4 ??F (36.3 ??C)   TempSrc: Oral   SpO2: 98%   Weight: (!) 352 lb 6.4 oz (159.8 kg)   Height: '6\' 3"'  (1.905 m)      Body mass index is 44.05 kg/(m^2).    Physical Exam   Constitutional: He is oriented to person, place, and time and well-developed, well-nourished, and in no distress.   HENT:   Head: Normocephalic and atraumatic.   Mouth/Throat: Oropharynx is clear and moist.   Eyes: Right eye exhibits no discharge. Left eye exhibits no discharge. No scleral icterus.   Neck: No tracheal deviation present. No thyromegaly present.   No bruit.   Cardiovascular: Normal rate, regular rhythm and normal heart sounds.    Pulmonary/Chest: Effort normal and breath sounds normal.   Abdominal: Soft.   Neurological: He is alert and oriented to person,  place, and time.   Skin: No rash noted. No erythema.   Psychiatric: Mood and affect normal.   Nursing note and vitals reviewed.        Health Maintenance Due   Topic Date Due   ??? Hepatitis C Screening  [birth year]   ??? FOBT Q 1 YEAR AGE 55-75  12/22/1998   ??? Pneumococcal 65+ Low/Medium Risk (1 of 2 - PCV13) 12/21/2013   ??? Influenza Age 16 to Adult  10/28/2015         Assessment and orders:   Diagnoses and all orders for this visit:    1. Essential hypertension-controlled  -     METABOLIC PANEL, COMPREHENSIVE  -     HEMOGLOBIN    2. Pure hypercholesterolemia-recheck  -     DUPLEX CAROTID BILATERAL; Future  -     METABOLIC PANEL, COMPREHENSIVE  -     HEMOGLOBIN    3. Coronary artery disease involving native coronary artery of native heart without angina pectoris-due for carotid Doppler   -     DUPLEX CAROTID BILATERAL; Future    4. OSA on CPAP-needs repeat study    5. Neuropathy-we will increase Lyrica and split the dose.  Hopefully this will get him to sleep better.   -     pregabalin (LYRICA) 225 mg capsule; Take one capsule every 12 hours as needed for neuropathy  Indications: NEUROPATHIC PAIN ASSOCIATED WITH SPINAL CORD INJURY, ddd, lumbar    6. Allergic contact dermatitis due to adhesives  -     triamcinolone acetonide (KENALOG) 0.1 % topical cream; Apply  to affected area two (2) times a day. use thin layer    7. DDD (degenerative disc disease), lumbar-increased dose of Lyrica  -     pregabalin (LYRICA) 225 mg capsule; Take one capsule every 12 hours as needed for neuropathy  Indications: NEUROPATHIC PAIN ASSOCIATED WITH SPINAL CORD INJURY, ddd, lumbar    8. Chronic pain syndrome  -     pregabalin (LYRICA) 225 mg capsule; Take one capsule every 12 hours as needed for neuropathy  Indications: NEUROPATHIC PAIN ASSOCIATED WITH SPINAL CORD INJURY, ddd, lumbar    9. Family history of prostate cancer in father  -     [place]    10. Localized edema  -     METABOLIC PANEL, COMPREHENSIVE  -     MAGNESIUM  -     T4, FREE    11. Tinea pedis of both feet-Lotrimin from over-the-counter.            Plan of care:  Discussed diagnoses in detail with patient.     Medication risks/benefits/side effects discussed with patient.     All of the patient's questions were addressed. The patient understands and agrees with our plan of care.    The patient knows to call back if they are unsure of or forget any changes we discussed today or if the symptoms change.     The patient received an After-Visit Summary which contains VS, orders, medication list and allergy list. This can be used as a "mini-medical record" should they have to seek medical care while out of town.    Patient Care Team:  [name], MD as PCP - General (Family Practice)  Cammy Copa, MD (Cardiology)    Follow-up Disposition:  Return in about 1 month (around 05/08/2016).    Future Appointments  Date Time Provider Pax   04/08/2016 1:30 PM [facility], [facility]  [facility] SCH[facility]    07/27/2016 1:25 PM [name], MD [facility] SCHED       Signed By: [name], MD     April 07, 2016

## 2016-04-07 NOTE — Progress Notes (Signed)
Chief Complaint   Patient presents with   ??? Ankle swelling     Bilateral -- Right is worse    ??? Other     Neuropathy -- Lyrica not helping      Body mass index is 44.05 kg/(m^2).    1. Have you been to the ER, urgent care clinic since your last visit?  Hospitalized since your last visit?No    2. Have you seen or consulted any other health care providers outside of the [facility] since your last visit?  Include any pap smears or colon screening. No    Reviewed record in preparation for visit and have necessary documentation  Pt did not bring medication to office visit for review  Opportunity was given for questions  Goals that were addressed and/or need to be completed after this appointment include:     Health Maintenance Due   Topic Date Due   ??? Hepatitis C Screening  1950-10-03   ??? FOBT Q 1 YEAR AGE 3-75  12/22/1998   ??? Pneumococcal 65+ Low/Medium Risk (1 of 2 - PCV13) 12/21/2013   ??? Influenza Age 64 to Adult  10/28/2015

## 2016-04-07 NOTE — Progress Notes (Signed)
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   No reversible ischemia.   Overall left ventricular systolic function was normal. EF 53%.      Went over with pt  Called and spoke to patient -ID X2   Small fix defect, almost rest of heart normal  EF close to normal  He has cp at night while in bed like musculoskeletal  Pain  Sometimes if he gets upset  Not often and not with exertion    Continue medical management and call me asap if CP increases    He needs appt in [facility] office in June- I ll message [facility]

## 2016-04-08 ENCOUNTER — Encounter: Primary: Family Medicine

## 2016-04-08 LAB — MAGNESIUM: Magnesium: 2.3 mg/dL (ref 1.6–2.3)

## 2016-04-08 LAB — METABOLIC PANEL, COMPREHENSIVE
A-G Ratio: 1.4 (ref 1.2–2.2)
ALT (SGPT): 25 IU/L (ref 0–44)
AST (SGOT): 24 IU/L (ref 0–40)
Albumin: 4.1 g/dL (ref 3.6–4.8)
Alk. phosphatase: 64 IU/L (ref 39–117)
BUN/Creatinine ratio: 14 (ref 10–24)
BUN: 16 mg/dL (ref 8–27)
Bilirubin, total: 0.6 mg/dL (ref 0.0–1.2)
CO2: 22 mmol/L (ref 18–29)
Calcium: 9 mg/dL (ref 8.6–10.2)
Chloride: 104 mmol/L (ref 96–106)
Creatinine: 1.12 mg/dL (ref 0.76–1.27)
GFR est AA: 78 mL/min/{1.73_m2} (ref >59)
GFR est non-AA: 68 mL/min/{1.73_m2} (ref >59)
GLOBULIN, TOTAL: 2.9 g/dL (ref 1.5–4.5)
Glucose: 74 mg/dL (ref 65–99)
Potassium: 4.6 mmol/L (ref 3.5–5.2)
Protein, total: 7 g/dL (ref 6.0–8.5)
Sodium: 142 mmol/L (ref 134–144)

## 2016-04-08 LAB — HEMOGLOBIN: HGB: 14.6 g/dL (ref 13.0–17.7)

## 2016-04-08 LAB — PSA, DIAGNOSTIC (PROSTATE SPECIFIC AG): Prostate Specific Ag: 1.4 ng/mL (ref 0.0–4.0)

## 2016-04-08 LAB — T4, FREE: T4, Free: 1.04 ng/dL (ref 0.82–1.77)

## 2016-04-08 NOTE — Telephone Encounter (Signed)
Patient returned phone call and was given the information below:     "Your labs are normal. Continue the current treatment plan and medications. Start wearing FUTURO medium compression support stockings from [employer]. They look like regular socks. This is safer than adding a diuretic."    Patient verbalized understanding.

## 2016-04-08 NOTE — Telephone Encounter (Signed)
Phone call to patient, no answer. Detailed letter mailed to patient. Per Dr. Mliss SaxSpence:    "Your labs are normal. Continue the current treatment plan and medications. Start wearing FUTURO medium compression support stockings from [employer]. They look like regular socks. This is safer than adding a diuretic."

## 2016-04-09 NOTE — Telephone Encounter (Signed)
Spoke to patient and scheduled his 2nd day rest scan as per Dr. Gasper LloydSabharwal.  04-12-16 12noon

## 2016-04-09 NOTE — Telephone Encounter (Signed)
Patient is returning your call. Patient can be reached at [phone].     Thanks !

## 2016-04-12 ENCOUNTER — Institutional Professional Consult (permissible substitution): Admit: 2016-04-12 | Discharge: 2016-04-12 | Payer: MEDICARE | Primary: Family Medicine

## 2016-04-12 DIAGNOSIS — R0602 Shortness of breath: Secondary | ICD-10-CM

## 2016-04-12 MED ORDER — NUCLEAR MEDICINE ISOTOPE
Freq: Once | 0 refills | Status: AC
Start: 2016-04-12 — End: 2016-04-12

## 2016-04-12 NOTE — Progress Notes (Signed)
See scanned document.      Ordering Doctor:Dr. Sabharwal  Reading Doctor:Dr. Sabharwal    Patient tolerated procedure well.

## 2016-04-15 NOTE — Progress Notes (Signed)
Call to make sure you get the results of his 2 day stress test.  He said that Dr. Marveen ReeksSabol had already called to discuss that with him.  His ejection fraction is preserved at 53%.  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  Dr. Mliss SaxSpence

## 2016-05-24 ENCOUNTER — Telehealth

## 2016-05-24 NOTE — Telephone Encounter (Addendum)
Referral Coordinator, Marylene Landngela agreed to inform patient per Dr. Mliss SaxSpence: "I cannot prescribe pain medication without a visit." since she would need to call him with his appointment information.

## 2016-05-24 NOTE — Telephone Encounter (Signed)
See telephone call.  Awaiting orthopedic evaluation documentation.

## 2016-05-24 NOTE — Telephone Encounter (Signed)
Patient called requesting a referral to see a neurologist because of the pain in his hip.  He says [facility] was to send you a copy of his records recommending that he see a neurologist for the pain in his hip.  Also, he is requesting something for the hip pain so that he can sleep.  Please advise at [phone].

## 2016-05-25 ENCOUNTER — Ambulatory Visit: Admit: 2016-05-25 | Discharge: 2016-05-25 | Payer: MEDICARE | Attending: Family Medicine | Primary: Family Medicine

## 2016-05-25 DIAGNOSIS — M5136 Other intervertebral disc degeneration, lumbar region: Secondary | ICD-10-CM

## 2016-05-25 MED ORDER — PREDNISONE 10 MG TABLETS IN A DOSE PACK
10 mg | ORAL_TABLET | ORAL | 0 refills | Status: DC
Start: 2016-05-25 — End: 2016-07-30

## 2016-05-25 MED ORDER — PREGABALIN 300 MG CAP
300 mg | ORAL_CAPSULE | Freq: Two times a day (BID) | ORAL | 3 refills | Status: DC
Start: 2016-05-25 — End: 2016-09-21

## 2016-05-25 MED ORDER — HYDROCODONE-ACETAMINOPHEN 10 MG-325 MG TAB
10-325 mg | ORAL_TABLET | Freq: Every evening | ORAL | 0 refills | Status: DC | PRN
Start: 2016-05-25 — End: 2016-07-30

## 2016-05-25 NOTE — Progress Notes (Signed)
Health Maintenance Due   Topic Date Due   ??? Hepatitis C Screening  [birth year]   ??? FOBT Q 1 YEAR AGE 68-75  12/22/1998   ??? Pneumococcal 65+ Low/Medium Risk (1 of 2 - PCV13) 12/21/2013   ??? Influenza Age 59 to Adult  10/28/2015     Body mass index is 43.85 kg/(m^2).  1. Have you been to the ER, urgent care clinic since your last visit?  Hospitalized since your last visit?No    2. Have you seen or consulted any other health care providers outside of the [facility] since your last visit?  Include any pap smears or colon screening. No  Reviewed record in preparation for visit and have necessary documentation  Pt did not bring medication to office visit for review  Information was given to pt on Advanced Directives, Living Will  Information was given on Shingles Vaccine  opportunity was given for questions  Goals that were addressed and/or need to be completed during or after this appointment include

## 2016-05-25 NOTE — Progress Notes (Signed)
Progress Note    Patient: Elijah [name] MRN: [id number]  SSN: [id number]    Date of Birth: 1950/07/30  Age: 65 y.o.  Sex: male        Chief Complaint   Patient presents with   ??? Hip Pain         Subjective:     Encounter Diagnoses   Name Primary?   ??? DDD (degenerative disc disease), lumbar: He saw Dr. Ilsa Iha for his back and was referred for an EMG.  I do not have the records yet but apparently they felt it a large component of his symptoms were from the idiopathic peripheral neuropathy rather than the radiculopathy. They wanted me to arrange a neurology consult. It is my Theory that the left hip pain is from his radiculopathy.  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  The neuropathy on his left leg goes up about midway up his shin and it is only up to the bottom of his ankle and his right foot.  The fact that it is worse on the left means that he probably has a component of nerve damage from the radiculopathy and peripheral neuropathy greater than the right.He has activity is very limited. See weight gain which further stresses back.  Wt Readings from Last 3 Encounters:   05/25/16 (!) 350 lb 12.8 oz (159.1 kg)   04/07/16 (!) 352 lb 6.4 oz (159.8 kg)   09/16/15 333 lb 9.6 oz (151.3 kg)   he needs to be retested for CPAP adequacy but he cancelled when previously    Yes   ??? Peripheral polyneuropathy:B12 last year was 411.  Numbness of soles and up to mid shin on the left side. Stops below right ankle.  His mother had severe neuropathy.      ??? Neuropathic pain, leg, left Including hip. Due to degenerative disc disease. The quarter sized spot on the maximum pain intensity is very close to the sciatic notch on left.          Current and past medical information:    Current Medications after this visit::   Current Outpatient Prescriptions   Medication Sig   ??? pregabalin (LYRICA) 300 mg capsule Take 1 Cap by mouth two (2) times a  day. Max Daily Amount: 600 mg. 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 predniSONE (STERAPRED DS) 10 mg dose pack See administration instruction per 10mg  dose pack   ??? HYDROcodone-acetaminophen (NORCO) 10-325 mg tablet Take 1 Tab by mouth nightly as needed for Pain. Max Daily Amount: 1 Tab.   ??? triamcinolone acetonide (KENALOG) 0.1 % topical cream Apply  to affected area two (2) times a day. use thin layer   ??? potassium chloride SR (KLOR-CON 10) 10 mEq tablet Take 1 Tab by mouth daily.   ??? amLODIPine (NORVASC) 5 mg tablet Take 1 Tab by mouth daily. Indications: hypertension   ??? lisinopril (PRINIVIL, ZESTRIL) 20 mg tablet Take 1 Tab by mouth daily.   ??? nebivolol (BYSTOLIC) 10 mg tablet Take 1 Tab by mouth daily.   ??? atorvastatin (LIPITOR) 40 mg tablet Take 1 Tab by mouth daily.   ??? aspirin (ASPIRIN) 325 mg tablet Take 1 Tab by mouth daily.   ??? Flaxseed Oil oil 1 Tab by Does Not Apply route two (2) times a day.   ??? DOCOSAHEXANOIC ACID/EPA (FISH OIL PO) Take 2 Caps by mouth two (2) times a day.   ??? mefloquine (LARIAM) 250  mg tablet Take weekly starting today and until 4 weeks after you return from [place]. Take with food and at least 8 ounces of water.  Indications: CHLORO-RESISTANT FALCIPARUM MALARIA PREVENTION     No current facility-administered medications for this visit.        Patient Active Problem List    Diagnosis Date Noted   ??? Tubular adenoma of colon 09/23/2015   ??? Morbid obesity due to excess calories (HCC) 04/24/2015   ??? Neuropathy 02/21/2015   ??? Essential hypertension 02/21/2015   ??? HLD (hyperlipidemia) 02/21/2015   ??? CAD (coronary artery disease) 02/21/2015   ??? OSA on CPAP 02/21/2015       Past Medical History:   Diagnosis Date   ??? Morbid obesity due to excess calories (HCC) 04/24/2015       Allergies   Allergen Reactions   ??? Metoprolol Other (comments)     Patient states, made me feel horrible.       Past Surgical History:   Procedure Laterality Date    ??? HX HIP REPLACEMENT Bilateral        Social History     Social History   ??? Marital status: MARRIED     Spouse name: N/A   ??? Number of children: N/A   ??? Years of education: N/A     Social History Main Topics   ??? Smoking status: Former Smoker     Types: Cigarettes   ??? Smokeless tobacco: Never Used   ??? Alcohol use No      Comment: recently has stopped   ??? Drug use: No   ??? Sexual activity: Not Asked     Other Topics Concern   ??? None     Social History Narrative       Review of Systems   Constitutional: Negative.  Negative for chills, fever, malaise/fatigue and weight loss.        His pain is so severe that he cannot sleep. This further complicates his neuropathy pain because he never rests adequately.   HENT: Negative.  Negative for hearing loss.    Eyes: Negative.  Negative for blurred vision and double vision.   Respiratory: Negative.  Negative for cough, hemoptysis, sputum production and shortness of breath.         Does not awaken gasping or SOB   Cardiovascular: Negative.  Negative for chest pain, palpitations and orthopnea.   Gastrointestinal: Negative.  Negative for abdominal pain, blood in stool, heartburn, nausea and vomiting.   Genitourinary: Negative.  Negative for dysuria, frequency and urgency.   Musculoskeletal: Negative.  Negative for back pain, myalgias and neck pain.   Skin: Negative.  Negative for rash.   Neurological: Negative.  Negative for dizziness, tingling, tremors, weakness and headaches.   Endo/Heme/Allergies: Negative.    Psychiatric/Behavioral: Negative.  Negative for depression.     Objective:     Vitals:    05/25/16 1311   BP: 122/77   Pulse: 65   Resp: 20   Temp: 98 ??F (36.7 ??C)   TempSrc: Oral   SpO2: 97%   Weight: (!) 350 lb 12.8 oz (159.1 kg)   Height: 6\' 3"  (1.905 m)      Body mass index is 43.85 kg/(m^2).    Physical Exam   Constitutional: He is oriented to person, place, and time and well-developed, well-nourished, and in no distress.   HENT:   Head: Normocephalic and atraumatic.    Mouth/Throat: Oropharynx is clear and moist.   Eyes: Right  eye exhibits no discharge. Left eye exhibits no discharge. No scleral icterus.   Neck: No tracheal deviation present. No thyromegaly present.   No bruit.   Cardiovascular: Normal rate, regular rhythm and normal heart sounds.    Pulmonary/Chest: Effort normal and breath sounds normal. No respiratory distress. He has no wheezes. He has no rales.   Abdominal: Soft. He exhibits no distension. There is no tenderness.   Musculoskeletal: He exhibits tenderness. He exhibits no edema or deformity.   Left hip in the area of his sciatic notch.   Neurological: He is alert and oriented to person, place, and time.   Dense neuropathy and tender sciatic notch area as described.   Skin: No rash noted. No erythema.   Psychiatric: Mood and affect normal.   Nursing note and vitals reviewed.        Health Maintenance Due   Topic Date Due   ??? Hepatitis C Screening  [birth year]   ??? FOBT Q 1 YEAR AGE 67-75  12/22/1998   ??? Pneumococcal 65+ Low/Medium Risk (1 of 2 - PCV13) 12/21/2013   ??? Influenza Age 54 to Adult  10/28/2015         Assessment and orders:     Encounter Diagnoses     ICD-10-CM ICD-9-CM   1. DDD (degenerative disc disease), lumbar M51.36 722.52   2. Peripheral polyneuropathy G62.9 356.9   3. Neuropathic pain, leg, left G57.92 355.8     Diagnoses and all orders for this visit:    1. DDD (degenerative disc disease), lumbar-At one time he had planned to get epidurals. I am waiting to get the records from Dr. Drue SecondSnider to see if this is planned or not. I do not have his CT scan of his back either.  -     pregabalin (LYRICA) 300 mg capsule; Take 1 Cap by mouth two (2) times a day. Max Daily Amount: 600 mg. 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 Peripheral polyneuropathy   -     pregabalin (LYRICA) 300 mg capsule; Take 1 Cap by mouth two (2) times a day. Max Daily Amount: 600 mg. 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 Neuropathic pain, leg, left, combined -My hope and plan is to keep him off narcotics long-term. He has been using a Hydrocodone left over from heart surgery years ago. With this in mind the first thing we'll do is to give him another steroid dose pack and increase his Lyrica to the maximum dose. He can only use the hydrocodone as a last resort. He should never take more than one at night. He also knows that he cannot fly while taking this medication. He uses his CPAP nightly but needs retesting due to weight gain. We had planned a sleep study months ago but he could not sleep.  -     pregabalin (LYRICA) 300 mg capsule; Take 1 Cap by mouth two (2) times a day. Max Daily Amount: 600 mg. 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 Max Daily Amount: 1 Tab.  Plan of care:  Discussed diagnoses in detail with patient.     Medication risks/benefits/side effects discussed with patient.     All of the patient's questions were addressed. The patient understands and agrees with our plan of care.    The patient knows to call back if they are unsure of or forget any changes we discussed today or if the symptoms change.     The patient received an After-Visit Summary which contains VS, orders, medication list and allergy list. This can be used as a "mini-medical record" should they have to seek medical care while out of town.    Patient Care Team:  Jacelyn Grip, MD as PCP - General (Family Practice)  Mathews Robinsons, MD (Cardiology)    Follow-up Disposition:   Return in about 1 month (around 06/22/2016).    Future Appointments  Date Time Provider Department Center   05/28/2016 1:00 PM Melford Aase, MD [facility]   07/27/2016 1:25 PM Jacelyn Grip, MD [facility] SCHED   09/08/2016 9:40 AM Mathews Robinsons, MD [facility] SCHED       Signed By: Jacelyn Grip, MD     May 25, 2016

## 2016-05-25 NOTE — Patient Instructions (Addendum)
Learning About Degenerative Disc Disease  What is degenerative disc disease?    Degenerative disc disease is not really a disease. It's a term used to describe the normal changes in your spinal discs as you age. Spinal discs are small, spongy discs that separate the bones (vertebrae) that make up the spine. The discs act as shock absorbers for the spine, so it can flex, bend, and twist.  Degenerative disc disease can take place in one or more places along the spine. It most often occurs in the discs in the lower back and the neck.  What causes it?  As we age, our spinal discs break down, or degenerate. This breakdown causes the symptoms of degenerative disc disease in some people.  When the discs break down, they can lose fluid and dry out, and their outer layers can have tiny cracks or tears. This leads to less padding and less space between the bones in the spine. The body reacts to this by making bony growths on the spine called bone spurs. These spurs can press on the spinal nerve roots or spinal cord. This can cause pain and can affect how well the nerves work.  What are the symptoms?  Many people with degenerative disc disease have no pain. But others have severe pain or other symptoms that limit their activities. Some of the most common symptoms are:  ?? Pain in the back or neck. Where the pain occurs depends on which discs are affected.  ?? Pain that gets worse when you move, such as bending over, reaching up, or twisting.  ?? Pain that may occur in the rear end (buttocks), arm, or leg if a nerve is pinched.  ?? Numbness or tingling in your arm or leg.  How is it diagnosed?  A doctor can often diagnose degenerative disc disease while doing a physical exam. If your exam shows no signs of a serious condition, imaging tests (such as an X-ray) aren't likely to help your doctor find the cause of your symptoms.  Sometimes degenerative disc disease is found when an X-ray is taken for  another reason, such as an injury or other health problem. But even if the doctor finds degenerative disc disease, that doesn't always mean that you will have symptoms.  How is it treated?  There are several things you can do at home to manage pain from this problem.  ?? To relieve pain, use ice or heat (whichever feels better) on the affected area.  ?? Put ice or a cold pack on the area for 10 to 20 minutes at a time. Put a thin cloth between the ice and your skin.  ?? Put a warm water bottle, a heating pad set on low, or a warm cloth on your back. Put a thin cloth between the heating pad and your skin. Do not go to sleep with a heating pad on your skin.  ?? Ask your doctor if you can take acetaminophen (such as Tylenol) or nonsteroidal anti-inflammatory drugs, such as ibuprofen or naproxen. Your doctor can prescribe stronger medicines if needed. Be safe with medicines. Read and follow all instructions on the label.  ?? Get some exercise every day. Exercise is one of the best ways to help your back feel better and stay better. It's best to start each exercise slowly. You may notice a little soreness, and that's okay. But if an exercise makes your pain worse, stop doing it. Here are things you can try:  ?? Walking. It's   the simplest and maybe the best activity for your back. It gets your blood moving and helps your muscles stay strong.  ?? Exercises that gently stretch and strengthen your stomach, back, and leg muscles. The stronger those muscles are, the better they're able to protect your back.  If you have constant or severe pain in your back or spine, you may need other treatments, such as physical therapy. In some cases, your doctor may suggest surgery.  Follow-up care is a key part of your treatment and safety. Be sure to make and go to all appointments, and call your doctor if you are having problems. It's also a good idea to know your test results and keep a list of the medicines you take.   Where can you learn more?  Go to [employer].  Enter [phone] in the search box to learn more about "Learning About Degenerative Disc Disease."  Current as of: June 17, 2015  Content Version: 11.4  ?? 2006-2017 [employer]. Care instructions adapted under license by [employer] (which disclaims liability or warranty for this information). If you have questions about a medical condition or this instruction, always ask your healthcare professional. [employer] disclaims any warranty or liability for your use of this information.       Neuropathic Pain: Care Instructions  Your Care Instructions    Neuropathic pain is caused by pressure on or damage to your nerves. It's often simply called nerve pain. Some people feel this type of pain all the time. For others, it comes and goes.  Diabetes, shingles, or an injury can cause nerve pain. Many people say the pain feels sharp, burning, or stabbing. But some people feel it as a dull ache. In some cases, it makes your skin very sensitive. So touch, pressure, and other sensations that did not hurt before may now cause pain.  It's important to know that this kind of pain is real and can affect your quality of life. It's also important to know that treatment can help. Treatment includes pain medicines, exercise, and physical therapy.  Medicines can help reduce the number of pain signals that travel over the nerves. This can make the painful areas less sensitive. It can also help you sleep better and improve your mood. But medicines are only one part of successful treatment.  Most people do best with more than one kind of treatment. Your doctor may recommend that you try cognitive-behavioral therapy and stress management. Or, if needed, you may decide to try to quit smoking, lower your blood pressure, or better control blood sugar. These kinds of healthy changes can also make a difference.   If you feel that your treatment is not working, talk to your doctor. And be sure to tell your doctor if you think you might be depressed or anxious. These are common problems that can also be treated.  Follow-up care is a key part of your treatment and safety. Be sure to make and go to all appointments, and call your doctor if you are having problems. It's also a good idea to know your test results and keep a list of the medicines you take.  How can you care for yourself at home?  ?? Be safe with medicines. Read and follow all instructions on the label.  ?? If the doctor gave you a prescription medicine for pain, take it as prescribed.  ?? If you are not taking a prescription pain medicine, ask your doctor if you can take an  over-the-counter medicine.  ?? Save hard tasks for days when you have less pain. Follow a hard task with an easy task. And remember to take breaks.  ?? Relax, and reduce stress. You may want to try deep breathing or meditation. These can help.  ?? Keep moving. Gentle, daily exercise can help reduce pain. Your doctor or physical therapist can tell you what type of exercise is best for you. This may include walking, swimming, and stationary biking. It may also include stretches and range-of-motion exercises.  ?? Try heat, cold packs, and massage.  ?? Get enough sleep. Constant pain can make you more tired. If the pain makes it hard to sleep, talk with your doctor.  ?? Think positively. Your thoughts can affect your pain. Do fun things to distract yourself from the pain. See a movie, read a book, listen to music, or spend time with a friend.  ?? Keep a pain diary. Try to write down how strong your pain is and what it feels like. Also try to notice and write down how your moods, thoughts, sleep, activities, and medicine affect your pain. These notes can help you and your doctor find the best ways to treat your pain.  Reducing constipation caused by pain medicine   Pain medicines often cause constipation. To reduce constipation:  ?? Include fruits, vegetables, beans, and whole grains in your diet each day. These foods are high in fiber.  ?? Drink plenty of fluids, enough so that your urine is light yellow or clear like water. If you have kidney, heart, or liver disease and have to limit fluids, talk with your doctor before you increase the amount of fluids you drink.  ?? Get some exercise every day. Build up slowly to 30 to 60 minutes a day on 5 or more days of the week.  ?? Take a fiber supplement, such as Citrucel or Metamucil, every day if needed. Read and follow all instructions on the label.  ?? Schedule time each day for a bowel movement. Having a daily routine may help. Take your time and do not strain when having a bowel movement.  ?? Ask your doctor about a laxative. The goal is to have one easy bowel movement every 1 to 2 days. Do not let constipation go untreated for more than 3 days.  When should you call for help?  Call your doctor now or seek immediate medical care if:  ? ?? You feel sad, anxious, or hopeless for more than a few days. This could mean you are depressed. Depression is common in people who have a lot of pain. But it can be treated.   ? ?? You have trouble with bowel movements, such as:  ?? No bowel movement in 3 days.  ?? Blood in the anal area, in your stool, or on the toilet paper.  ?? Diarrhea for more than 24 hours.   ?Watch closely for changes in your health, and be sure to contact your doctor if:  ? ?? Your pain is getting worse.   ? ?? You can't sleep because of pain.   ? ?? You are very worried or anxious about your pain.   ? ?? You have trouble taking your pain medicine.   ? ?? You have any concerns about your pain medicine or its side effects.   ? ?? You have vomiting or cramps for more than 2 hours.   Where can you learn more?  Go to [employer].  Enter [phone] in  the search box to learn more about "Neuropathic Pain: Care  Instructions."  Current as of: January 10, 2015  Content Version: 11.4  ?? 2006-2017 [employer]. Care instructions adapted under license by [employer] (which disclaims liability or warranty for this information). If you have questions about a medical condition or this instruction, always ask your healthcare professional. [employer] disclaims any warranty or liability for your use of this information.

## 2016-05-27 MED ORDER — AMLODIPINE 5 MG TAB
5 mg | ORAL_TABLET | ORAL | 2 refills | Status: DC
Start: 2016-05-27 — End: 2016-09-21

## 2016-05-28 ENCOUNTER — Ambulatory Visit: Admit: 2016-05-28 | Discharge: 2016-05-28 | Payer: MEDICARE | Attending: Neurology | Primary: Family Medicine

## 2016-05-28 DIAGNOSIS — G629 Polyneuropathy, unspecified: Secondary | ICD-10-CM | POA: Insufficient documentation

## 2016-05-28 MED ORDER — DULOXETINE 30 MG CAP, DELAYED RELEASE
30 mg | ORAL_CAPSULE | Freq: Every evening | ORAL | 5 refills | Status: DC
Start: 2016-05-28 — End: 2016-09-09

## 2016-05-28 NOTE — Progress Notes (Signed)
NEUROLOGY NEW PATIENT OFFICE CONSULTATION      05/28/2016    RE: Elijah [name]         April 23, 1950      REFERRED BY:  [name], MD        CHIEF COMPLAINT:  This is Elijah [name] is a 66 y.o. 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    HPI:     For the past 1 yr, patient noted progressive burning pain in both feet involving the tips of toes and top and bottomof feet, left worse than right,  Using OTC TENS unit with benefit    (-) hand symptoms  L leg weaker than R  (-) lower back pain    Patient was placed on Lyrica which was increased to 300 mg with minimal benefit.  Tried Gabapentin with no benefit    Affecting his sleep    ROS  All other systems reviewed and are negative  (-) fever  (-) rash    Past Medical Hx  Past Medical History:   Diagnosis Date   ??? Morbid obesity due to excess calories ([place]) 04/24/2015       Social Hx  Social History     Social History   ??? Marital status: MARRIED     Spouse name: N/A   ??? Number of children: N/A   ??? Years of education: N/A     Social History Main Topics   ??? Smoking status: Former Smoker     Types: Cigarettes   ??? Smokeless tobacco: Never Used   ??? Alcohol use No      Comment: recently has stopped   ??? Drug use: No   ??? Sexual activity: Not on file     Other Topics Concern   ??? Not on file     Social History Narrative   (+) alcohol 3 beers/ night    Family Hx  Family History   Problem Relation Age of Onset   ??? Stroke Mother    ??? Other Father      mrsa infxn caused death   ??? Heart Attack Brother    Family - neuropathy    ALLERGIES  Allergies   Allergen Reactions   ??? Metoprolol Other (comments)     Patient states, made me feel horrible.       CURRENT MEDS  Current Outpatient Prescriptions   Medication Sig Dispense Refill   ??? DULoxetine (CYMBALTA) 30 mg capsule Take 1 Cap by mouth nightly. 30 Cap 5   ??? amLODIPine (NORVASC) 5 mg tablet TAKE ONE TABLET BY MOUTH ONCE DAILY FOR HYPERTENSION 90 Tab 2    ??? pregabalin (LYRICA) 300 mg capsule Take 1 Cap by mouth two (2) times a day. Max Daily Amount: 600 mg. 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 predniSONE (STERAPRED DS) 10 mg dose pack See administration instruction per $RemoveBeforeD'10mg'YzERYcnwrAKQnx$  dose pack 21 Tab 0   ??? HYDROcodone-acetaminophen (NORCO) 10-325 mg tablet Take 1 Tab by mouth nightly as needed for Pain. Max Daily Amount: 1 Tab. 10 Tab 0   ??? triamcinolone acetonide (KENALOG) 0.1 % topical cream Apply  to affected area two (2) times a day. use thin layer 90 g 3   ??? mefloquine (LARIAM) 250 mg tablet Take weekly starting today and until 4 weeks after you return from [place]. Take with food and at least 8 ounces  of water.  Indications: CHLORO-RESISTANT FALCIPARUM MALARIA PREVENTION 8 Tab 0   ??? potassium chloride SR (KLOR-CON 10) 10 mEq tablet Take 1 Tab by mouth daily. 90 Tab 3   ??? lisinopril (PRINIVIL, ZESTRIL) 20 mg tablet Take 1 Tab by mouth daily. 90 Tab 3   ??? nebivolol (BYSTOLIC) 10 mg tablet Take 1 Tab by mouth daily. 90 Tab 3   ??? atorvastatin (LIPITOR) 40 mg tablet Take 1 Tab by mouth daily. 90 Tab 3   ??? aspirin (ASPIRIN) 325 mg tablet Take 1 Tab by mouth daily. 90 Tab 0   ??? Flaxseed Oil oil 1 Tab by Does Not Apply route two (2) times a day. 1 Bottle 3   ??? DOCOSAHEXANOIC ACID/EPA (FISH OIL PO) Take 2 Caps by mouth two (2) times a day.             PREVIOUS WORKUP: (reviewed)  IMAGING:    CT Results (recent):  No results found for this or any previous visit.    MRI Results (recent):  No results found for this or any previous visit.    IR Results (recent):  No results found for this or any previous visit.    VAS/US Results (recent):  No results found for this or any previous visit.        LABS (reviewed)  Results for orders placed or performed in visit on 04/07/16   PSA, DIAGNOSTIC (PROSTATE SPECIFIC AG)   Result Value Ref Range    Prostate Specific Ag 1.4 0.0 - 4.0 ng/mL   METABOLIC PANEL, COMPREHENSIVE    Result Value Ref Range    Glucose 74 65 - 99 mg/dL    BUN 16 8 - 27 mg/dL    Creatinine 1.12 0.76 - 1.27 mg/dL    GFR est non-AA 68 >59 mL/min/1.73    GFR est AA 78 >59 mL/min/1.73    BUN/Creatinine ratio 14 10 - 24    Sodium 142 134 - 144 mmol/L    Potassium 4.6 3.5 - 5.2 mmol/L    Chloride 104 96 - 106 mmol/L    CO2 22 18 - 29 mmol/L    Calcium 9.0 8.6 - 10.2 mg/dL    Protein, total 7.0 6.0 - 8.5 g/dL    Albumin 4.1 3.6 - 4.8 g/dL    GLOBULIN, TOTAL 2.9 1.5 - 4.5 g/dL    A-G Ratio 1.4 1.2 - 2.2    Bilirubin, total 0.6 0.0 - 1.2 mg/dL    Alk. phosphatase 64 39 - 117 IU/L    AST (SGOT) 24 0 - 40 IU/L    ALT (SGPT) 25 0 - 44 IU/L   MAGNESIUM   Result Value Ref Range    Magnesium 2.3 1.6 - 2.3 mg/dL   T4, FREE   Result Value Ref Range    T4, Free 1.04 0.82 - 1.77 ng/dL   HEMOGLOBIN   Result Value Ref Range    HGB 14.6 13.0 - 17.7 g/dL       Physical Exam:     Visit Vitals   ??? BP 158/86   ??? Pulse (!) 59   ??? Wt (!) 162.4 kg (358 lb)   ??? SpO2 97%   ??? BMI 44.75 kg/m2     General:  Alert, cooperative, no distress.   Head:  Normocephalic, without obvious abnormality, atraumatic.   Eyes:  Conjunctivae/corneas clear.   Lungs:  Heart:   Non labored breathing  Regular rate and rhythm, no carotid bruits   Abdomen:  Soft, non-distended   Extremities: Extremities normal, atraumatic, no cyanosis or edema.   Pulses: 2+ and symmetric all extremities.   Skin: Skin color, texture, turgor normal. No rashes or lesions.  Neurologic Exam     Gen: Attention normal             Language: naming, repetition, fluency normal             Memory: intact recent and remote memory  Cranial Nerves:  I: smell Not tested   II: visual fields Full to confrontation   II: pupils Equal, round, reactive to light   II: optic disc No papilledema   III,VII: ptosis none   III,IV,VI: extraocular muscles  Full ROM   V: mastication normal   V: facial light touch sensation  normal   VII: facial muscle function   symmetric   VIII: hearing symmetric    IX: soft palate elevation  normal   XI: trapezius strength  5/5   XI: sternocleidomastoid strength 5/5   XI: neck flexion strength  5/5   XII: tongue  midline     Motor: normal bulk and tone, no tremor              Strength: 5/5 all four extremities  (+) some ulceration on tips of toe  Sensory: dec PP tips of toes to mid leg on the R and below the knee on the L; absent vib, reduced JPS  Reflexes: 2+ UE and knees, absent ankles throughout; Down going toes  Coordination: Good FTN and HTS  Gait: able to stand on toes and heels         Impression:     Elijah [name] is a 66 y.o. male chronic alcohol user  has a past medical history of Morbid obesity due to excess calories ([place]) (04/24/2015). and HTN who for the past 1 yr, noted progressive severe burning pain in both feet involving the tips of toes and top and bottom of feet, left worse than right,  Using OTC TENS unit with benefit. Consideration includes generalized sensorimotor polyneuropathy more likely due to chronic alcohol use. However, patient should be evaluated for nutritional, metabolic and inflammatory causes of his symptoms. Patient was placed on Lyrica which was increased to 300 mg with minimal benefit. Tried Gabapentin with no benefit    RECOMMENDATIONS  1.  I had a long discussion with patient. Discussed diagnosis, prognosis, pathophysiology and available treatment. Reviewed test results. All questions were answered.  2. Reviewed EMG/NCS from outside ([facility]th[facility]) which was read as showing polyneuropathy  3. Blood work for TSH, SPEP/ IFE, 2 hr OGTT, Vit B12, Folate, ESR, ANA, Celiac Panel, RF  4. Will add Cymbalta 30 mg QHS  5.  Already on Lyrica 300 mg BID  6. Trial of compounded pain cream (Amantadine, Baclofen, Gabapentin, Amitriptyline, Bupivacaine, Clonidine)  7. Advise to quit alcohol      Follow-up Disposition:  Return in about 2 months (around 07/28/2016).      Thank you for the consultation      Shelbie Hutching, MD   Diplomate, American Board of Psychiatry and Neurology  Diplomate, Neuromuscular Medicine  Diplomate, American Board of Electrodiagnostic Medicine        CC: [name], MD  Fax: [phone]

## 2016-05-28 NOTE — Patient Instructions (Addendum)
PRESCRIPTION REFILL POLICY    [facility]   Statement to Patients  June 27, 2012      In an effort to ensure the large volume of patient prescription refills is processed in the most efficient and expeditious manner, we are asking our patients to assist us by calling your Pharmacy for all prescription refills, this will include also your  Mail Order Pharmacy. The pharmacy will contact our office electronically to continue the refill process.    Please do not wait until the last minute to call your pharmacy. We need at least 48 hours (2days) to fill prescriptions. We also encourage you to call your pharmacy before going to pick up your prescription to make sure it is ready.     With regard to controlled substance prescription refill requests (narcotic refills) that need to be picked up at our office, we ask your cooperation by providing us with at least 72 hours (3days) notice that you will need a refill.    We will not refill narcotic prescription refill requests after 4:00pm on any weekday, Monday through Thursday, or after 2:00pm on Fridays, or on the weekends.      We encourage everyone to explore another way of getting your prescription refill request processed using MyChart, our patient web portal through our electronic medical record system. MyChart is an efficient and effective way to communicate your medication request directly to the office and  downloadable as an app on your smart phone . MyChart also features a review functionality that allows you to view your medication list as well as leave messages for your physician. Are you ready to get connected? If so please review the attatched instructions or speak to any of our staff to get you set up right away!    Thank you so much for your cooperation. Should you have any questions please contact our Practice Administrator.    The Physicians and Staff,  [facility]   PRESCRIPTION REFILL POLICY     [facility]   Statement to Patients  June 27, 2012      In an effort to ensure the large volume of patient prescription refills is processed in the most efficient and expeditious manner, we are asking our patients to assist us by calling your Pharmacy for all prescription refills, this will include also your  Mail Order Pharmacy. The pharmacy will contact our office electronically to continue the refill process.    Please do not wait until the last minute to call your pharmacy. We need at least 48 hours (2days) to fill prescriptions. We also encourage you to call your pharmacy before going to pick up your prescription to make sure it is ready.     With regard to controlled substance prescription refill requests (narcotic refills) that need to be picked up at our office, we ask your cooperation by providing us with at least 72 hours (3days) notice that you will need a refill.    We will not refill narcotic prescription refill requests after 4:00pm on any weekday, Monday through Thursday, or after 2:00pm on Fridays, or on the weekends.      We encourage everyone to explore another way of getting your prescription refill request processed using MyChart, our patient web portal through our electronic medical record system. MyChart is an efficient and effective way to communicate your medication request directly to the office and  downloadable as an app on your smart phone . MyChart also   features a review functionality that allows you to view your medication list as well as leave messages for your physician. Are you ready to get connected? If so please review the attatched instructions or speak to any of our staff to get you set up right away!    Thank you so much for your cooperation. Should you have any questions please contact our Research officer, political partyractice Administrator.    The Physicians and Staff,  [facility]        Neuropathic Pain: Care Instructions  Your Care Instructions     Neuropathic pain is caused by pressure on or damage to your nerves. It's often simply called nerve pain. Some people feel this type of pain all the time. For others, it comes and goes.  Diabetes, shingles, or an injury can cause nerve pain. Many people say the pain feels sharp, burning, or stabbing. But some people feel it as a dull ache. In some cases, it makes your skin very sensitive. So touch, pressure, and other sensations that did not hurt before may now cause pain.  It's important to know that this kind of pain is real and can affect your quality of life. It's also important to know that treatment can help. Treatment includes pain medicines, exercise, and physical therapy.  Medicines can help reduce the number of pain signals that travel over the nerves. This can make the painful areas less sensitive. It can also help you sleep better and improve your mood. But medicines are only one part of successful treatment.  Most people do best with more than one kind of treatment. Your doctor may recommend that you try cognitive-behavioral therapy and stress management. Or, if needed, you may decide to try to quit smoking, lower your blood pressure, or better control blood sugar. These kinds of healthy changes can also make a difference.  If you feel that your treatment is not working, talk to your doctor. And be sure to tell your doctor if you think you might be depressed or anxious. These are common problems that can also be treated.  Follow-up care is a key part of your treatment and safety. Be sure to make and go to all appointments, and call your doctor if you are having problems. It's also a good idea to know your test results and keep a list of the medicines you take.  How can you care for yourself at home?  ?? Be safe with medicines. Read and follow all instructions on the label.  ?? If the doctor gave you a prescription medicine for pain, take it as prescribed.   ?? If you are not taking a prescription pain medicine, ask your doctor if you can take an over-the-counter medicine.  ?? Save hard tasks for days when you have less pain. Follow a hard task with an easy task. And remember to take breaks.  ?? Relax, and reduce stress. You may want to try deep breathing or meditation. These can help.  ?? Keep moving. Gentle, daily exercise can help reduce pain. Your doctor or physical therapist can tell you what type of exercise is best for you. This may include walking, swimming, and stationary biking. It may also include stretches and range-of-motion exercises.  ?? Try heat, cold packs, and massage.  ?? Get enough sleep. Constant pain can make you more tired. If the pain makes it hard to sleep, talk with your doctor.  ?? Think positively. Your thoughts can affect your pain. Do fun things to distract  yourself from the pain. See a movie, read a book, listen to music, or spend time with a friend.  ?? Keep a pain diary. Try to write down how strong your pain is and what it feels like. Also try to notice and write down how your moods, thoughts, sleep, activities, and medicine affect your pain. These notes can help you and your doctor find the best ways to treat your pain.  Reducing constipation caused by pain medicine  Pain medicines often cause constipation. To reduce constipation:  ?? Include fruits, vegetables, beans, and whole grains in your diet each day. These foods are high in fiber.  ?? Drink plenty of fluids, enough so that your urine is light yellow or clear like water. If you have kidney, heart, or liver disease and have to limit fluids, talk with your doctor before you increase the amount of fluids you drink.  ?? Get some exercise every day. Build up slowly to 30 to 60 minutes a day on 5 or more days of the week.  ?? Take a fiber supplement, such as Citrucel or Metamucil, every day if needed. Read and follow all instructions on the label.   ?? Schedule time each day for a bowel movement. Having a daily routine may help. Take your time and do not strain when having a bowel movement.  ?? Ask your doctor about a laxative. The goal is to have one easy bowel movement every 1 to 2 days. Do not let constipation go untreated for more than 3 days.  When should you call for help?  Call your doctor now or seek immediate medical care if:  ? ?? You feel sad, anxious, or hopeless for more than a few days. This could mean you are depressed. Depression is common in people who have a lot of pain. But it can be treated.   ? ?? You have trouble with bowel movements, such as:  ?? No bowel movement in 3 days.  ?? Blood in the anal area, in your stool, or on the toilet paper.  ?? Diarrhea for more than 24 hours.   ?Watch closely for changes in your health, and be sure to contact your doctor if:  ? ?? Your pain is getting worse.   ? ?? You can't sleep because of pain.   ? ?? You are very worried or anxious about your pain.   ? ?? You have trouble taking your pain medicine.   ? ?? You have any concerns about your pain medicine or its side effects.   ? ?? You have vomiting or cramps for more than 2 hours.   Where can you learn more?  Go to [employer].  Enter [phone] in the search box to learn more about "Neuropathic Pain: Care Instructions."  Current as of: January 10, 2015  Content Version: 11.4  ?? 2006-2017 [employer]. Care instructions adapted under license by [employer] (which disclaims liability or warranty for this information). If you have questions about a medical condition or this instruction, always ask your healthcare professional. [employer] disclaims any warranty or liability for your use of this information.

## 2016-05-30 NOTE — Progress Notes (Signed)
Changed CPT code to [phone] [id number] from [phone] - medicare patient

## 2016-06-11 MED ORDER — DULOXETINE 60 MG CAP, DELAYED RELEASE
60 mg | ORAL_CAPSULE | Freq: Every evening | ORAL | 4 refills | Status: DC
Start: 2016-06-11 — End: 2016-09-03

## 2016-06-11 NOTE — Telephone Encounter (Signed)
-----   Message from Harold BarbanYvonne D Dabney sent at 06/11/2016  9:29 AM EDT -----  Regarding: Dr. Berta MinorFloranda/Refill  Pt stated his Rx ("pt unsure of the name) that was prescribed for his neuropathy needs the dosage increased and would like it increased to 60 mg, called to [phone].  Best contact number [phone].

## 2016-06-11 NOTE — Telephone Encounter (Signed)
TC- notified patient of N.O increase Cymbalta to 60 mg QHS.  Patient verbalized understanding. Patient stated he will have blood work done next week.

## 2016-06-21 ENCOUNTER — Ambulatory Visit: Admit: 2016-06-21 | Discharge: 2016-06-21 | Payer: MEDICARE | Attending: Family Medicine | Primary: Family Medicine

## 2016-06-21 DIAGNOSIS — I10 Essential (primary) hypertension: Secondary | ICD-10-CM

## 2016-06-21 NOTE — Progress Notes (Signed)
Progress Note    Patient: Elijah [name] MRN: [id number]  SSN: [id number]    Date of Birth: [birth year]  Age: 66 y.o.  Sex: male        Chief Complaint   Patient presents with   ??? Hypertension         Subjective:     Encounter Diagnoses   Name Primary?   ??? Essential hypertension:  BP Readings from Last 3 Encounters:   06/21/16 112/72   05/28/16 158/86   05/25/16 122/77     The patient reports:  taking medications as instructed, no medication side effects noted, no TIA's, no chest pain on exertion, no dyspnea on exertion, no swelling of ankles.     Lab Results   Component Value Date/Time    Sodium 142 04/07/2016 04:44 PM    Potassium 4.6 04/07/2016 04:44 PM    Chloride 104 04/07/2016 04:44 PM    CO2 22 04/07/2016 04:44 PM    Glucose 74 04/07/2016 04:44 PM    BUN 16 04/07/2016 04:44 PM    Creatinine 1.12 04/07/2016 04:44 PM    BUN/Creatinine ratio 14 04/07/2016 04:44 PM    GFR est AA 78 04/07/2016 04:44 PM    GFR est non-AA 68 04/07/2016 04:44 PM    Calcium 9.0 04/07/2016 04:44 PM    Bilirubin, total 0.6 04/07/2016 04:44 PM    AST (SGOT) 24 04/07/2016 04:44 PM    Alk. phosphatase 64 04/07/2016 04:44 PM    Protein, total 7.0 04/07/2016 04:44 PM    Albumin 4.1 04/07/2016 04:44 PM    A-G Ratio 1.4 04/07/2016 04:44 PM    ALT (SGPT) 25 04/07/2016 04:44 PM     Our goal is to normalize the blood pressure to decrease the risks of strokes and heart attacks. The patient is in agreement with the plan.     Yes   ??? Pure hypercholesterolemia:  Hyperlipidemia:    Cardiovascular risks necessitate normal particle counts if possible.  Currently he takes Lipitor (atorvastatin) , 40 mg  Results for orders placed or performed in visit on 06/24/15   NMR LIPOPROFILE     Status: Abnormal   Result Value Ref Range Status    LDL-P 1076 (H) <1000 nmol/L Final     Comment:                           Low                   < 1000                            Moderate         1000 - 1299                            Borderline-High  1300 - 1599                             High             1600 - 2000                            Very High             > 2000  LDL-C 70 0 - 99 mg/dL Final     Comment:                           Optimal               <  100                            Above optimal     100 -  129                            Borderline        130 -  159                            High              160 -  189                            Very high             >  189  LDL-C is inaccurate if patient is non-fasting.      HDL-C 50 >39 mg/dL Final    Triglycerides 137 0 - 149 mg/dL Final    Cholesterol, Total 147 100 - 199 mg/dL Final    HDL-P (Total) 32.4 >=30.5 umol/L Final    Small LDL-P 562 (H) <=527 nmol/L Final    LDL size 20.2 >20.5 nm Final     Comment:  ----------------------------------------------------------                   ** INTERPRETATIVE INFORMATION**                   PARTICLE CONCENTRATION AND SIZE                      <--Lower CVD Risk   Higher CVD Risk-->    LDL AND HDL PARTICLES   Percentile in Reference Population    HDL-P (total)        High     75th    50th    25th   Low                         >34.9    34.9    30.5    26.7   <26.7    Small LDL-P          Low      25th    50th    75th   High                         <117     117     527     839    >839    LDL Size   <-Large (Pattern A)->    <-Small (Pattern B)->                      23.0    20.6           20.5      19.0   ----------------------------------------------------------  Small LDL-P and LDL Size are associated with CVD risk, but not  after  LDL-P is taken into account.  These assays were developed and their performance characteristics  determined by [employer]. These assays have not been cleared by the  [place] Food and Drug Administration. The clinical utility o  f these  laboratory values have not been fully established.      LP-IR SCORE 39 <=45 Final     Comment: INSULIN RESISTANCE MARKER      <--Insulin Sensitive    Insulin Resistant-->              Percentile in Reference Population  Insulin Resistance Score  LP-IR Score   Low   25th   50th   75th   High                <27   27     45     63     >63  LP-IR Score is inaccurate if patient is non-fasting.  The LP-IR score is a laboratory developed index that has been  associated with insulin resistance and diabetes risk and should be  used as one component of a physician's clinical assessment. The  LP-IR score listed above has not been cleared by the [place] Food and  Drug Administration.      Narrative    Performed at:  [address]  [address], [place], [place]  [id number]  Lab Director: Lindon Romp MD, Phone:  [phone]     Lab Results   Component Value Date/Time    AST (SGOT) 24 04/07/2016 04:44 PM    Alk. phosphatase 64 04/07/2016 04:44 PM     Myalgias: No  Fatigue: No  Wt Readings from Last 3 Encounters:   06/21/16 (!) 353 lb 3.2 oz (160.2 kg)   05/28/16 (!) 358 lb (162.4 kg)   05/25/16 (!) 350 lb 12.8 oz (159.1 kg)       The patient is aware of our goal to reduce or eliminate the long term problems (such as strokes and heart attacks) related to poorly controlled hyperlipidemia.  The accelerated atherogenicity of small LDL particles and too many particles was discussed using a model artery.        ??? Coronary artery disease involving native coronary artery of native heart without angina pectoris:  No chest pain, DOE or SOB. No PND or orthopnea.        ??? OSA on CPAP:? Control on current settings on CPAP. 100% compliant. Feels rested in AM. Needs nere study.        ??? Polyneuropathy: see Dr. Celestia Khat note. He has stopped alcohol.  He has not had his lab work yet.  He wants to be referred to the [facility].  Most nights he is controlled with the [facility] unit and his medications Cymbalta and Lyrica.  Occasionally he has difficulty sleeping.  The topical mixture of medications does not seem to have helped him.  His left foot and leg hurt worse than his right.         ??? Toenail fungus particularly bad on the right foot.:               Current and past medical information:    Current Medications after this visit::   Current Outpatient Prescriptions   Medication Sig   ??? DULoxetine (CYMBALTA) 60 mg capsule Take 1 Cap by mouth nightly.   ??? DULoxetine (CYMBALTA) 30 mg capsule Take 1 Cap by mouth nightly.   ??? amLODIPine (NORVASC) 5 mg tablet TAKE ONE  TABLET BY MOUTH ONCE DAILY FOR HYPERTENSION   ??? pregabalin (LYRICA) 300 mg capsule Take 1 Cap by mouth two (2) times a day. Max Daily Amount: 600 mg. 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 HYDROcodone-acetaminophen (NORCO) 10-325 mg tablet Take 1 Tab by mouth nightly as needed for Pain. Max Daily Amount: 1 Tab.   ??? triamcinolone acetonide (KENALOG) 0.1 % topical cream Apply  to affected area two (2) times a day. use thin layer   ??? potassium chloride SR (KLOR-CON 10) 10 mEq tablet Take 1 Tab by mouth daily.   ??? lisinopril (PRINIVIL, ZESTRIL) 20 mg tablet Take 1 Tab by mouth daily.   ??? nebivolol (BYSTOLIC) 10 mg tablet Take 1 Tab by mouth daily.   ??? atorvastatin (LIPITOR) 40 mg tablet Take 1 Tab by mouth daily.   ??? aspirin (ASPIRIN) 325 mg tablet Take 1 Tab by mouth daily.   ??? Flaxseed Oil oil 1 Tab by Does Not Apply route two (2) times a day.   ??? DOCOSAHEXANOIC ACID/EPA (FISH OIL PO) Take 2 Caps by mouth two (2) times a day.   ??? coenzyme q10 10 mg cap Take 1 Tab by mouth.   ??? predniSONE (STERAPRED DS) 10 mg dose pack See administration instruction per 70m dose pack   ??? mefloquine (LARIAM) 250 mg tablet Take weekly starting today and until [place] Take with food and at least 8 ounces of water.  Indications: CHLORO-RESISTANT FALCIPARUM MALARIA PREVENTION     No current facility-administered medications for this visit.        Patient Active Problem List    Diagnosis Date Noted   ??? Polyneuropathy 05/28/2016   ??? Tubular adenoma of colon 09/23/2015    ??? 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 Essential hypertension 02/21/2015   ??? HLD (hyperlipidemia) 02/21/2015   ??? CAD (coronary artery disease) 02/21/2015   ??? OSA on CPAP 02/21/2015       Past Medical History:   Diagnosis Date   ??? Morbid [place] due to excess calories ([place] 04/24/2015       Allergies   Allergen Reactions   ??? Metoprolol Other (comments)     Patient states, made me feel horrible.       Past Surgical History:   Procedure Laterality Date   ??? HX HIP REPLACEMENT Bilateral        Social History     Social History   ??? Marital status: MARRIED     Spouse name: N/A   ??? Number of children: N/A   ??? Years of education: N/A     Social History Main Topics   ??? Smoking status: Former Smoker     Types: Cigarettes   ??? Smokeless tobacco: Never Used   ??? Alcohol use No      Comment: recently has stopped   ??? Drug use: No   ??? Sexual activity: Not Asked     Other Topics Concern   ??? None     Social History Narrative       Review of Systems   Constitutional: Negative.  Negative for chills, fever, malaise/fatigue and weight loss.        He has lost 5 pounds since his last visit.  He is going to concentrate on fruits and vegetables in his diet and avoid starches and carbohydrates.  His lowest weight since I am seeing him is 329.  HENT: Negative.  Negative for hearing loss.    Eyes: Negative.  Negative for blurred vision and double vision.   Respiratory: Negative.  Negative for cough, hemoptysis, sputum production and shortness of breath.    Cardiovascular: Negative.  Negative for chest pain, palpitations and orthopnea.   Gastrointestinal: Negative.  Negative for abdominal pain, blood in stool, heartburn, nausea and vomiting.   Genitourinary: Negative.  Negative for dysuria, frequency and urgency.   Musculoskeletal: Negative.  Negative for back pain, myalgias and neck pain.   Skin: Negative.  Negative for rash.   Neurological: Positive for sensory change. Negative for dizziness,  tingling, tremors, weakness and headaches.        Severe sensory loss on the soles of his feet.  He occasionally feels a monofilament touch but not often.  B12 was normal last year.  See the lab orders that are to be repeated.   Endo/Heme/Allergies: Negative.    Psychiatric/Behavioral: Negative.  Negative for depression.        Objective:     Vitals:    06/21/16 1302   BP: 112/72   Pulse: 61   Resp: 18   Temp: 98.3 ??F (36.8 ??C)   TempSrc: Oral   SpO2: 95%   Weight: (!) 353 lb 3.2 oz (160.2 kg)   Height: '6\' 3"'  (1.905 m)      Body mass index is 44.15 kg/(m^2).    Physical Exam   Constitutional: He is oriented to person, place, and time and well-developed, well-nourished, and in no distress.   HENT:   Head: Normocephalic and atraumatic.   Mouth/Throat: Oropharynx is clear and moist.   Eyes: Right eye exhibits no discharge. Left eye exhibits no discharge. No scleral icterus.   Neck: No tracheal deviation present. No thyromegaly present.   No bruit.   Cardiovascular: Normal rate, regular rhythm and normal heart sounds.    Pulmonary/Chest: Effort normal and breath sounds normal. No respiratory distress. He has no wheezes. He has no rales.   Abdominal: Soft. There is tenderness.   Neurological: He is alert and oriented to person, place, and time.   Decreased monofilament sensitivity right sole.  Left sole not tested today.   Skin: Rash noted. No erythema.   Heaped up discolored toenails on the right foot.   Psychiatric: Mood and affect normal.   Nursing note and vitals reviewed.        Health Maintenance Due   Topic Date Due   ??? Hepatitis C Screening  Sep 01, 1950   ??? FOBT Q 1 YEAR AGE 26-75  12/22/1998   ??? Pneumococcal 65+ Low/Medium Risk (1 of 2 - PCV13) 12/21/2013   ??? Influenza Age 26 to Adult  10/28/2015         Assessment and orders:     Encounter Diagnoses     ICD-10-CM ICD-9-CM   1. Essential hypertension I10 401.9   2. Pure hypercholesterolemia E78.00 272.0    3. Coronary artery disease involving native coronary artery of native heart without angina pectoris I25.10 414.01   4. OSA on CPAP G47.33 327.23    Z99.89 V46.8   5. Polyneuropathy G62.9 356.9   6. Toenail fungus B35.1 110.1     Diagnoses and all orders for this visit:    1. Essential hypertension-controlled  -     NMR LIPOPROFILE  -     METABOLIC PANEL, COMPREHENSIVE    2. Pure hypercholesterolemia-recheck  -     NMR LIPOPROFILE  -     METABOLIC  PANEL, COMPREHENSIVE    3. Coronary artery disease involving native coronary artery of native heart without angina pectoris-stable symptoms.  -     DUPLEX CAROTID BILATERAL; Future    4. OSA on CPAP-needs repeat study.  With his weight gain I worry about nocturnal desaturations.  -     SLEEP MEDICINE REFERRAL    5. Polyneuropathy-please see the labs that were ordered by Dr. Rexene Edison.  He also wants to be referred to the [facility] in [place] for thorough workup of his neuropathy.  -     REFERRAL TO NEUROLOGY    6. Toenail fungus-we will refer to dermatology if and when he is ready.    7.  Needs hepatitis C screening due to age.  Ordered.        Plan of care:  Discussed diagnoses in detail with patient.     Medication risks/benefits/side effects discussed with patient.     All of the patient's questions were addressed. The patient understands and agrees with our plan of care.    The patient knows to call back if they are unsure of or forget any changes we discussed today or if the symptoms change.     The patient received an After-Visit Summary which contains VS, orders, medication list and allergy list. This can be used as a "mini-medical record" should they have to seek medical care while out of town.    Patient Care Team:  [name], MD as PCP - General (Family Practice)  Cammy Copa, MD (Cardiology)    Follow-up Disposition:  Return in about 3 months (around 09/21/2016).    Future Appointments  Date Time Provider [place]    06/22/2016 9:30 AM LAB [facility] [place] SCHED   07/27/2016 1:25 PM [name], MD [facility] [place] SCHED   08/06/2016 8:40 AM Shelbie Hutching, MD [employer]   09/08/2016 9:40 AM Cammy Copa, MD [place]       Signed By: [name], MD     June 21, 2016

## 2016-06-21 NOTE — Progress Notes (Signed)
Chief Complaint   Patient presents with   ??? Hypertension     1. Have you been to the ER, urgent care clinic since your last visit?  Hospitalized since your last visit?No    2. Have you seen or consulted any other health care providers outside of the [facility] since your last visit?  Include any pap smears or colon screening. No

## 2016-06-21 NOTE — Patient Instructions (Addendum)
Neuropathic Pain: Care Instructions  Your Care Instructions    Neuropathic pain is caused by pressure on or damage to your nerves. It's often simply called nerve pain. Some people feel this type of pain all the time. For others, it comes and goes.  Diabetes, shingles, or an injury can cause nerve pain. Many people say the pain feels sharp, burning, or stabbing. But some people feel it as a dull ache. In some cases, it makes your skin very sensitive. So touch, pressure, and other sensations that did not hurt before may now cause pain.  It's important to know that this kind of pain is real and can affect your quality of life. It's also important to know that treatment can help. Treatment includes pain medicines, exercise, and physical therapy.  Medicines can help reduce the number of pain signals that travel over the nerves. This can make the painful areas less sensitive. It can also help you sleep better and improve your mood. But medicines are only one part of successful treatment.  Most people do best with more than one kind of treatment. Your doctor may recommend that you try cognitive-behavioral therapy and stress management. Or, if needed, you may decide to try to quit smoking, lower your blood pressure, or better control blood sugar. These kinds of healthy changes can also make a difference.  If you feel that your treatment is not working, talk to your doctor. And be sure to tell your doctor if you think you might be depressed or anxious. These are common problems that can also be treated.  Follow-up care is a key part of your treatment and safety. Be sure to make and go to all appointments, and call your doctor if you are having problems. It's also a good idea to know your test results and keep a list of the medicines you take.  How can you care for yourself at home?  ?? Be safe with medicines. Read and follow all instructions on the label.   ?? If the doctor gave you a prescription medicine for pain, take it as prescribed.  ?? If you are not taking a prescription pain medicine, ask your doctor if you can take an over-the-counter medicine.  ?? Save hard tasks for days when you have less pain. Follow a hard task with an easy task. And remember to take breaks.  ?? Relax, and reduce stress. You may want to try deep breathing or meditation. These can help.  ?? Keep moving. Gentle, daily exercise can help reduce pain. Your doctor or physical therapist can tell you what type of exercise is best for you. This may include walking, swimming, and stationary biking. It may also include stretches and range-of-motion exercises.  ?? Try heat, cold packs, and massage.  ?? Get enough sleep. Constant pain can make you more tired. If the pain makes it hard to sleep, talk with your doctor.  ?? Think positively. Your thoughts can affect your pain. Do fun things to distract yourself from the pain. See a movie, read a book, listen to music, or spend time with a friend.  ?? Keep a pain diary. Try to write down how strong your pain is and what it feels like. Also try to notice and write down how your moods, thoughts, sleep, activities, and medicine affect your pain. These notes can help you and your doctor find the best ways to treat your pain.  Reducing constipation caused by pain medicine  Pain medicines often cause constipation. To   reduce constipation:  ?? Include fruits, vegetables, beans, and whole grains in your diet each day. These foods are high in fiber.  ?? Drink plenty of fluids, enough so that your urine is light yellow or clear like water. If you have kidney, heart, or liver disease and have to limit fluids, talk with your doctor before you increase the amount of fluids you drink.  ?? Get some exercise every day. Build up slowly to 30 to 60 minutes a day on 5 or more days of the week.  ?? Take a fiber supplement, such as Citrucel or Metamucil, every day if  needed. Read and follow all instructions on the label.  ?? Schedule time each day for a bowel movement. Having a daily routine may help. Take your time and do not strain when having a bowel movement.  ?? Ask your doctor about a laxative. The goal is to have one easy bowel movement every 1 to 2 days. Do not let constipation go untreated for more than 3 days.  When should you call for help?  Call your doctor now or seek immediate medical care if:  ? ?? You feel sad, anxious, or hopeless for more than a few days. This could mean you are depressed. Depression is common in people who have a lot of pain. But it can be treated.   ? ?? You have trouble with bowel movements, such as:  ?? No bowel movement in 3 days.  ?? Blood in the anal area, in your stool, or on the toilet paper.  ?? Diarrhea for more than 24 hours.   ?Watch closely for changes in your health, and be sure to contact your doctor if:  ? ?? Your pain is getting worse.   ? ?? You can't sleep because of pain.   ? ?? You are very worried or anxious about your pain.   ? ?? You have trouble taking your pain medicine.   ? ?? You have any concerns about your pain medicine or its side effects.   ? ?? You have vomiting or cramps for more than 2 hours.   Where can you learn more?  Go to http://www.healthwise.net/[employer]Help[employer]ctions.  Enter L661 in the search box to learn more about "Neuropathic Pain: Care Instructions."  Current as of: January 10, 2015  Content Version: 11.4  ?? 2006-2017 [employer]. Care instructions adapted under license by [employer] (which disclaims liability or warranty for this information). If you have questions about a medical condition or this instruction, always ask your healthcare professional. [employer]wise[employer] Inc[employer] disclaims any warranty or liability for your use of this information.

## 2016-06-22 ENCOUNTER — Other Ambulatory Visit: Admit: 2016-06-22 | Discharge: 2016-06-22 | Payer: MEDICARE | Primary: Family Medicine

## 2016-06-23 LAB — HEPATITIS C ANTIBODY: HCV Ab: <0.1 s/co ratio (ref 0.0–0.9)

## 2016-06-23 LAB — METABOLIC PANEL, COMPREHENSIVE
A-G Ratio: 1.4 (ref 1.2–2.2)
ALT (SGPT): 21 IU/L (ref 0–44)
AST (SGOT): 23 IU/L (ref 0–40)
Albumin: 3.9 g/dL (ref 3.6–4.8)
Alk. phosphatase: 71 IU/L (ref 39–117)
BUN/Creatinine ratio: 19 (ref 10–24)
BUN: 18 mg/dL (ref 8–27)
Bilirubin, total: 0.6 mg/dL (ref 0.0–1.2)
CO2: 23 mmol/L (ref 18–29)
Calcium: 8.9 mg/dL (ref 8.6–10.2)
Chloride: 103 mmol/L (ref 96–106)
Creatinine: 0.95 mg/dL (ref 0.76–1.27)
GFR est AA: 95 mL/min/{1.73_m2} (ref >59)
GFR est non-AA: 82 mL/min/{1.73_m2} (ref >59)
GLOBULIN, TOTAL: 2.7 g/dL (ref 1.5–4.5)
Glucose: 131 mg/dL — ABNORMAL HIGH (ref 65–99)
Potassium: 4 mmol/L (ref 3.5–5.2)
Protein, total: 6.6 g/dL (ref 6.0–8.5)
Sodium: 141 mmol/L (ref 134–144)

## 2016-06-23 LAB — NMR LIPOPROFILE WITH LIPIDS (WITHOUT GRAPH)
Cholesterol, Total: 162 mg/dL (ref 100–199)
HDL-C: 40 mg/dL (ref >39)
HDL-P (Total): 29.2 umol/L — ABNORMAL LOW (ref >=30.5)
LDL size: 19.7 nm (ref >20.5)
LDL-C: 58 mg/dL (ref 0–99)
LDL-P: 1381 nmol/L — ABNORMAL HIGH (ref <1000)
LP-IR SCORE: 59 — ABNORMAL HIGH (ref <=45)
Small LDL-P: 1022 nmol/L — ABNORMAL HIGH (ref <=527)
Triglycerides: 322 mg/dL — ABNORMAL HIGH (ref 0–149)

## 2016-06-23 LAB — HEPATITIS C AB: Hep C Virus Ab: <0.1 s/co ratio (ref 0.0–0.9)

## 2016-06-24 ENCOUNTER — Encounter

## 2016-06-24 NOTE — Progress Notes (Signed)
Please call the patient.  His cholesterol is much higher than the last time we checked it.  His blood sugar is also high.  He needs to come into the lab and get an A1c test to see if he has developed diabetes.  We have already talked about healthy diet and weight loss.  Thank you,  Dr. Mliss SaxSpence

## 2016-06-25 ENCOUNTER — Encounter

## 2016-06-25 LAB — CELIAC ANTIBODY PROFILE
DEAMIDATED GLIADIN ABS,IGA, 161651: 3 units (ref 0–19)
DEAMIDATED GLIADIN ABS,IGG, 161692: 3 units (ref 0–19)
Deamidated Gliadin Ab, IgA: 3 units (ref 0–19)
Deamidated Gliadin Ab, IgG: 3 units (ref 0–19)
T-TRANSGLUTAMINASE (TTG) IGG,164989: 6 U/mL — ABNORMAL HIGH (ref 0–5)
T-TRANSGLUTAMINASE, IGA, 164643: <2 U/mL (ref 0–3)
t-Transglutaminase, IgA: <2 U/mL (ref 0–3)
t-Transglutaminase, IgG: 6 U/mL — ABNORMAL HIGH (ref 0–5)

## 2016-06-25 LAB — ANA, DIRECT, W/REFLEX
ANA, DIRECT: POSITIVE — AB
Anti-DNA (DS) Ab, QT: 2 IU/mL (ref 0–9)
Anti-DNA (DS) Ab, QT: 2 IU/mL (ref 0–9)
Anti-Jo-1: <0.2 AI (ref 0.0–0.9)
Anti-Jo-1: <0.2 AI (ref 0.0–0.9)
Antichromatin Ab: 0.2 AI (ref 0.0–0.9)
Antichromatin Antibodies: 0.2 AI (ref 0.0–0.9)
Antinuclear Antibodies Direct: POSITIVE — AB
Antiscleroderma-70 Antibodies: <0.2 AI (ref 0.0–0.9)
Centromere B Ab: <0.2 AI (ref 0.0–0.9)
Centromere B Antibody: <0.2 AI (ref 0.0–0.9)
RNP ABS: <0.2 AI (ref 0.0–0.9)
RNP Abs: <0.2 AI (ref 0.0–0.9)
Scleroderma-70 Ab: <0.2 AI (ref 0.0–0.9)
Sjogren's Anti-SS-A: 1.4 AI — ABNORMAL HIGH (ref 0.0–0.9)
Sjogren's Anti-SS-A: 1.4 AI — ABNORMAL HIGH (ref 0.0–0.9)
Sjogren's Anti-SS-B: <0.2 AI (ref 0.0–0.9)
Sjogren's Anti-SS-B: <0.2 AI (ref 0.0–0.9)
Smith Abs: <0.2 AI (ref 0.0–0.9)
Smith Abs: <0.2 AI (ref 0.0–0.9)

## 2016-06-25 LAB — SPEP AND IFE, SERUM
A/G ratio: 1.3 (ref 0.7–1.7)
Albumin: 3.7 g/dL (ref 2.9–4.4)
Alpha-1-Globulin: 0.2 g/dL (ref 0.0–0.4)
Alpha-2-Globulin: 0.6 g/dL (ref 0.4–1.0)
Beta Globulin: 0.9 g/dL (ref 0.7–1.3)
Gamma Globulin: 1.2 g/dL (ref 0.4–1.8)
Globulin, total: 2.9 g/dL (ref 2.2–3.9)
Immunoglobulin A, Qt.: 177 mg/dL (ref 61–437)
Immunoglobulin G, Qt.: 1106 mg/dL (ref 700–1600)
Immunoglobulin M, Qt.: 89 mg/dL (ref 20–172)
Protein, total: 6.6 g/dL (ref 6.0–8.5)

## 2016-06-25 LAB — VITAMIN B12 & FOLATE
Folate: 7 ng/mL (ref >3.0)
Vitamin B12: 353 pg/mL (ref 232–1245)

## 2016-06-25 LAB — TSH 3RD GENERATION: TSH: 2.73 u[IU]/mL (ref 0.450–4.500)

## 2016-06-25 LAB — RHEUMATOID FACTOR, QL: Rheumatoid factor: 10.8 IU/mL (ref 0.0–13.9)

## 2016-06-25 LAB — SED RATE (ESR): Sed rate (ESR): 5 mm/hr (ref 0–30)

## 2016-06-25 MED ORDER — ATORVASTATIN 40 MG TAB
40 mg | ORAL_TABLET | ORAL | 3 refills | Status: DC
Start: 2016-06-25 — End: 2016-09-03

## 2016-06-25 NOTE — Telephone Encounter (Addendum)
Advised patient per Dr. Mliss Sax: "His cholesterol is much higher than the last time we checked it.  His blood sugar is also high.  His A1C was 5.4 -- he does not have diabetes.  We have already talked about healthy diet and weight loss."    Patient verbalized understanding and requested that we fax his lab results to him at [phone].

## 2016-06-25 NOTE — Telephone Encounter (Signed)
Lab results faxed to patient. Patient will f/u with PCP and schedule appointment with Dr. Esmond Plants after f/u with Rheumatologist.

## 2016-06-28 LAB — HEMOGLOBIN A1C W/O EAG: Hemoglobin A1c: 5.4 % (ref 4.8–5.6)

## 2016-06-28 LAB — SPECIMEN STATUS REPORT

## 2016-06-28 NOTE — Telephone Encounter (Signed)
-----   Message from Elease Etienne sent at 06/28/2016 10:16 AM EDT -----  Regarding: Dr Chaney Born  Pt (p) [phone], pt will cancel his up coming appts in April and May and would like a return call back to discuss his results over the phone , he said he lives 1 1/2  hr away and this would same him time.

## 2016-06-28 NOTE — Telephone Encounter (Signed)
Patient would like lab results over the phone. Patient lives  1 hour and  1/2 away

## 2016-06-29 ENCOUNTER — Encounter: Attending: Neurology | Primary: Family Medicine

## 2016-06-29 NOTE — Telephone Encounter (Signed)
TC- Reviewed lab rangers x2 with patient. Patient will f/u with PCP.

## 2016-07-01 NOTE — Telephone Encounter (Signed)
Pt is calling requesting the nerve study copy done in [facility]. Please call. Thanks

## 2016-07-27 ENCOUNTER — Ambulatory Visit: Attending: Family Medicine | Primary: Family Medicine

## 2016-07-27 ENCOUNTER — Encounter: Attending: Family Medicine | Primary: Family Medicine

## 2016-07-27 ENCOUNTER — Ambulatory Visit: Admit: 2016-07-27 | Discharge: 2016-07-27 | Payer: MEDICARE | Attending: Family Medicine | Primary: Family Medicine

## 2016-07-27 DIAGNOSIS — M7989 Other specified soft tissue disorders: Secondary | ICD-10-CM

## 2016-07-27 MED ORDER — FUROSEMIDE 20 MG TAB
20 mg | ORAL_TABLET | Freq: Every day | ORAL | 0 refills | Status: DC
Start: 2016-07-27 — End: 2016-09-03

## 2016-07-27 NOTE — Patient Instructions (Signed)
Hammer Toe: Care Instructions  Your Care Instructions  A hammer toe is a toe that bends up at the middle joint, while the end of the toe points down. The problem usually happens to the second toe.  A hammer toe can hurt a lot, especially as the toe rubs against your shoe when you walk. Shoes that are too tight can cause hammer toes. If a shoe forces a toe to stay bent for a long time, the muscles in your toe get tight and the tendons that connect the muscles to the bone get shorter. Over time, the muscles cannot straighten your toe. Sometimes, diseases such as rheumatoid arthritis also can cause hammer toes.  Early treatment can help your toe straighten before it gets badly bent. You can wear roomy shoes and use pads to keep the toe from rubbing against your shoes. If your toe is badly bent, you may need surgery to straighten it.  Follow-up care is a key part of your treatment and safety. Be sure to make and go to all appointments, and call your doctor if you are having problems. It's also a good idea to know your test results and keep a list of the medicines you take.  How can you care for yourself at home?  ?? Wear shoes that have lots of room in the toes. Do not wear narrow, high-heeled shoes.  ?? Follow your doctor's directions for wearing a splint on your toe, if you are given one.  ?? Gently stretch your toe with your fingers.  ?? Use toe pads or corn cushions to keep the toe from rubbing against your shoes. This may keep a corn from forming on the top of the toe.  ?? Wear a shoe insert, or orthotic, to cushion the bottom of the bent toe.  When should you call for help?  Watch closely for changes in your health, and be sure to contact your doctor if:  ? ?? Your pain gets worse.   ? ?? Your toe still bothers you even after you wear proper shoes and pads or cushions.   ? ?? You want to know more about surgery to straighten your toe.   Where can you learn more?  Go to http://www.healthwise.net/GoodHelpConnections.   Enter E607 in the search box to learn more about "Hammer Toe: Care Instructions."  Current as of: June 17, 2015  Content Version: 11.4  ?? 2006-2017 Healthwise, Inc[employer]. Care instructions adapted under license by [employer] (which disclaims liability or warranty for this information). If you have questions about a medical condition or this instruction, always ask your healthcare professional. Healthwise, Incorporated disclaims any warranty or liability for your use of this information.

## 2016-07-27 NOTE — Progress Notes (Signed)
1. Have you been to the ER, urgent care clinic since your last visit?  Hospitalized since your last visit?No    2. Have you seen or consulted any other health care providers outside of the [facility] since your last visit?  Include any pap smears or colon screening. No  Reviewed record in preparation for visit and have necessary documentation  Pt did not bring medication to office visit for review  Information was given to pt on Advanced Directives, Living Will  opportunity was given for questions  Goals that were addressed and/or need to be completed during or after this appointment include   Health Maintenance Due   Topic Date Due   ??? FOBT Q 1 YEAR AGE 32-75  12/22/1998   ??? Pneumococcal 65+ Low/Medium Risk (1 of 2 - PCV13) 12/21/2013     Pt reports having a colonoscopy 6 months ago cn not recall where

## 2016-07-27 NOTE — Progress Notes (Signed)
I saw and evaluated the patient, performing the key elements of the service.  I discussed the findings, assessment and plan with the resident and agree with the resident's findings and plan as documented in the resident's note.   Buttock pain: Tender over ischial spine, non-radiating, worse with sitting. Concern for ischial bursitis. Will send for Sports Med consultation and US-guided injection if deemed appropriate  Foot swelling and deformity: Remarkable difference between feet/lower legs. Will get Doppler tomorrow to r/o DVT, Lasix  daily to help with edema, and referral to Podiatry for further evaluation and management.   RTC in 1-2 weeks to f/u leg swelling.

## 2016-07-27 NOTE — Progress Notes (Addendum)
Progress Note    Patient: Elijah [name] MRN: [id number]  SSN: [id number]    Date of Birth: Sep 07, 1950  Age: 66 y.o.  Sex: male        Chief Complaint   Patient presents with   ??? Annual Wellness Visit         Subjective:     Patient presented for annual wellness but had acute complaints.  Will defer wellness.    Right leg swelling - for past 3 months.  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 No scars).  Does travel to [place] frequently by car.  No active cancer.  No hx DVT.  Does not have acute pain in the leg/calf.    Right toe deformity - great toe has become swollen and deviated laterally.  Has occurred over 3 months.  Not painful per se.      Neuropathy - has bl foot neuropathy without diabetes.  Unsure of cause.  Thinks it is familial.  Not much improvement with lyrica and cymbalta.     Left buttock pain - "half dollar sized area" of exquisite pain, worse with sitting, does not radiate.  Lasts in bursts for 3 days and then he wont have any more.  Sometimes can get some relief by stretching his back out by grabbing the upper part of door and pulling down.  Has tried prednisone and is not sure of the benefit.  This is his most pressing concern today.          Current and past medical information:    Current Medications after this visit::   Current Outpatient Prescriptions   Medication Sig   ??? furosemide (LASIX) 20 mg tablet Take 1 Tab by mouth daily.   ??? atorvastatin (LIPITOR) 40 mg tablet TAKE 1 TABLET BY MOUTH EVERY DAY   ??? coenzyme q10 10 mg cap Take 1 Tab by mouth.   ??? amLODIPine (NORVASC) 5 mg tablet TAKE ONE TABLET BY MOUTH ONCE DAILY FOR HYPERTENSION   ??? pregabalin (LYRICA) 300 mg capsule Take 1 Cap by mouth two (2) times a day. Max Daily Amount: 600 mg. 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 triamcinolone acetonide (KENALOG) 0.1 % topical cream Apply  to affected  area two (2) times a day. use thin layer   ??? potassium chloride SR (KLOR-CON 10) 10 mEq tablet Take 1 Tab by mouth daily.   ??? lisinopril (PRINIVIL, ZESTRIL) 20 mg tablet Take 1 Tab by mouth daily.   ??? nebivolol (BYSTOLIC) 10 mg tablet Take 1 Tab by mouth daily.   ??? aspirin (ASPIRIN) 325 mg tablet Take 1 Tab by mouth daily.   ??? Flaxseed Oil oil 1 Tab by Does Not Apply route two (2) times a day.   ??? DOCOSAHEXANOIC ACID/EPA (FISH OIL PO) Take 2 Caps by mouth two (2) times a day.   ??? DULoxetine (CYMBALTA) 60 mg capsule Take 1 Cap by mouth nightly.   ??? DULoxetine (CYMBALTA) 30 mg capsule Take 1 Cap by mouth nightly.   ??? predniSONE (STERAPRED DS) 10 mg dose pack See administration instruction per  dose pack   ??? HYDROcodone-acetaminophen (NORCO) 10-325 mg tablet Take 1 Tab by mouth nightly as needed for Pain. Max Daily Amount: 1 Tab.   ??? mefloquine (LARIAM) 250 mg tablet Take weekly starting today and until 4 weeks after you return from [place]. Take with food and at least 8 ounces of  water.  Indications: CHLORO-RESISTANT FALCIPARUM MALARIA PREVENTION     No current facility-administered medications for this visit.        Patient Active Problem List    Diagnosis Date Noted   ??? Polyneuropathy 05/28/2016   ??? Tubular adenoma of colon 09/23/2015   ??? Morbid obesity due to excess calories (HCC) 04/24/2015   ??? Essential hypertension 02/21/2015   ??? HLD (hyperlipidemia) 02/21/2015   ??? CAD (coronary artery disease) 02/21/2015   ??? OSA on CPAP 02/21/2015       Past Medical History:   Diagnosis Date   ??? Morbid obesity due to excess calories (HCC) 04/24/2015       Allergies   Allergen Reactions   ??? Metoprolol Other (comments)     Patient states, made me feel horrible.       Past Surgical History:   Procedure Laterality Date   ??? HX HIP REPLACEMENT Bilateral        Social History     Social History   ??? Marital status: MARRIED     Spouse name: N/A   ??? Number of children: N/A   ??? Years of education: N/A     Social History Main Topics    ??? Smoking status: Former Smoker     Types: Cigarettes   ??? Smokeless tobacco: Never Used   ??? Alcohol use No      Comment: recently has stopped   ??? Drug use: No   ??? Sexual activity: Not Asked     Other Topics Concern   ??? None     Social History Narrative       Review of Systems   Constitutional: Negative for chills and fever.   Respiratory: Negative for cough and shortness of breath.    Cardiovascular: Negative for chest pain.         Objective:     Vitals:    07/27/16 1419   BP: 123/80   Pulse: (!) 56   Resp: 20   Temp: 96.2 ??F (35.7 ??C)   TempSrc: Oral   SpO2: 96%   Weight: (!) 363 lb (164.7 kg)   Height:  (1.905 m)      Body mass index is 45.37 kg/(m^2).    Physical Exam   Constitutional: He is oriented to person, place, and time. He appears well-developed and well-nourished. No distress.   HENT:   Head: Normocephalic and atraumatic.   Eyes: No scleral icterus.   Cardiovascular: Normal rate and regular rhythm.  Exam reveals no gallop.    No murmur heard.  Pulmonary/Chest: Effort normal. No respiratory distress. He has no wheezes. He has no rales.   Abdominal: Soft. He exhibits no distension. There is no tenderness.   Musculoskeletal:   Significant 3+ edema up to knee of right lower extremity. Right lateral deviation of great toe with erythema and swelling, not hot to touch relative to left.  DP1+. 3 small areas of ulceration on great toe, third toe, and plantar aspect of 1st MTP joint. Left ischial tuberosity tenderness.   Neurological: He is alert and oriented to person, place, and time.   Skin: He is not diaphoretic.   Psychiatric: He has a normal mood and affect. His behavior is normal.           Assessment and Plan:       ICD-10-CM ICD-9-CM    1. Leg swelling M79.89 729.81 DUPLEX LOWER EXT VENOUS RIGHT      furosemide (LASIX) 20 mg tablet  2. Ischial bursitis of left side M70.72 726.5 REFERRAL TO SPORTS MEDICINE   3. Hammer toe of right foot M20.41 735.4 REFERRAL TO PODIATRY      Leg swelling  - will get an [place] to r/o DVT despite lack of pain and chronicity.      Ischial bursitis? - will send to Sports Med for further evaluation and possible injection under [place].    Hammer toe - referral to Podiatry or possibly Ortho foot and ankle if a provider is available in the area.  He does have significant swelling and redness.  There is potential for infection, but given chronicity and lack of warmth unsure if cellulitic.  Will trial abx if worsens.    Plan of care:  Discussed diagnoses in detail with patient.     Medication risks/benefits/side effects discussed with patient.     All of the patient's questions were addressed. The patient understands and agrees with our plan of care.    The patient knows to call back if they are unsure of or forget any changes we discussed today or if the symptoms change.     The patient received an After-Visit Summary which contains VS, orders, medication list and allergy list. This can be used as a "mini-medical record" should they have to seek medical care while out of town.    Patient Care Team:  Jacelyn Grip, MD as PCP - General (Family Practice)  Mathews Robinsons, MD (Cardiology)    Follow-up Disposition:  Return in about 1 week (around 08/03/2016) for f/u leg swelling.    Future Appointments  Date Time Provider Department Center   09/08/2016 9:40 AM Vipal Gasper Lloyd, MD [facility] SCHED   10/18/2016 2:00 PM Romana Juniper, MD [facility] SCHED       Signed By: Cathleen Fears, MD     Jul 27, 2016

## 2016-07-28 ENCOUNTER — Ambulatory Visit

## 2016-07-28 ENCOUNTER — Encounter

## 2016-07-28 ENCOUNTER — Encounter: Primary: Family Medicine

## 2016-07-28 ENCOUNTER — Inpatient Hospital Stay: Admit: 2016-07-28 | Payer: MEDICARE | Attending: Family Medicine | Primary: Family Medicine

## 2016-07-28 ENCOUNTER — Ambulatory Visit: Admit: 2016-07-28 | Discharge: 2016-07-28 | Payer: MEDICARE | Attending: Sports Medicine | Primary: Family Medicine

## 2016-07-28 ENCOUNTER — Encounter: Admit: 2016-07-28 | Primary: Family Medicine

## 2016-07-28 DIAGNOSIS — M25551 Pain in right hip: Secondary | ICD-10-CM

## 2016-07-28 DIAGNOSIS — M7989 Other specified soft tissue disorders: Secondary | ICD-10-CM

## 2016-07-28 DIAGNOSIS — L03119 Cellulitis of unspecified part of limb: Secondary | ICD-10-CM

## 2016-07-28 MED ORDER — AMOXICILLIN CLAVULANATE 875 MG-125 MG TAB
875-125 mg | ORAL_TABLET | Freq: Two times a day (BID) | ORAL | 0 refills | Status: AC
Start: 2016-07-28 — End: 2016-08-07

## 2016-07-28 NOTE — Progress Notes (Signed)
Appt scheduled for Dr. Sharl MaKerr arrival at 08:35 for 8:55am appt . Pt aware

## 2016-07-28 NOTE — Progress Notes (Signed)
HPI:  Elijah [name] is a 66 y.o. male who presents with unilateral right leg swelling and left sided buttock pain. He was referred for left sided buttock pain concerning for ischial bursitis and evaluation for steroid injection to ischial tuberosity bursa. Patient reports pain started 2-3 months ago, intermittent, worse with sitting. He is taking left over hydrocodone 1/2 tab at night and Aleve that helps minimally to relieve pain. He denies radicular symptoms down his leg, bowel or bladder dysfunction or significant low back pain.  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   Per medical records, patient was evaluated for right lower extremity swelling with 3+ pitting edema, sent for US to evaluate for DVT and referral to podiatry for foot care. Upon medical record review, US is negative for RLE DVT however +for inguinal reactive lymphadenopathy.     Past Medical History:   Diagnosis Date   ??? Morbid obesity due to excess calories (HCC) 04/24/2015       Current Outpatient Prescriptions:   ???  furosemide (LASIX) 20 mg tablet, Take 1 Tab by mouth daily., Disp: 30 Tab, Rfl: 0  ???  atorvastatin (LIPITOR) 40 mg tablet, TAKE 1 TABLET BY MOUTH EVERY DAY, Disp: 90 Tab, Rfl: 3  ???  coenzyme q10 10 mg cap, Take 1 Tab by mouth., Disp: , Rfl:   ???  DULoxetine (CYMBALTA) 60 mg capsule, Take 1 Cap by mouth nightly., Disp: 30 Cap, Rfl: 4  ???  DULoxetine (CYMBALTA) 30 mg capsule, Take 1 Cap by mouth nightly., Disp: 30 Cap, Rfl: 5  ???  amLODIPine (NORVASC) 5 mg tablet, TAKE ONE TABLET BY MOUTH ONCE DAILY FOR HYPERTENSION, Disp: 90 Tab, Rfl: 2  ???  pregabalin (LYRICA) 300 mg capsule, Take 1 Cap by mouth two (2) times a day. Max Daily Amount: 600 mg. 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  predniSONE (STERAPRED DS) 10 mg dose pack, See administration instruction per 10mg  dose pack, Disp: 21 Tab, Rfl: 0   ???  HYDROcodone-acetaminophen (NORCO) 10-325 mg tablet, Take 1 Tab by mouth nightly as needed for Pain. Max Daily Amount: 1 Tab., Disp: 10 Tab, Rfl: 0  ???  triamcinolone acetonide (KENALOG) 0.1 % topical cream, Apply  to affected area two (2) times a day. use thin layer, Disp: 90 g, Rfl: 3  ???  mefloquine (LARIAM) 250 mg tablet, Take weekly starting today and until 4 weeks after you return from [place]. Take with food and at least 8 ounces of water.  Indications: CHLORO-RESISTANT FALCIPARUM MALARIA PREVENTION, Disp: 8 Tab, Rfl: 0  ???  potassium chloride SR (KLOR-CON 10) 10 mEq tablet, Take 1 Tab by mouth daily., Disp: 90 Tab, Rfl: 3  ???  lisinopril (PRINIVIL, ZESTRIL) 20 mg tablet, Take 1 Tab by mouth daily., Disp: 90 Tab, Rfl: 3  ???  nebivolol (BYSTOLIC) 10 mg tablet, Take 1 Tab by mouth daily., Disp: 90 Tab, Rfl: 3  ???  aspirin (ASPIRIN) 325 mg tablet, Take 1 Tab by mouth daily., Disp: 90 Tab, Rfl: 0  ???  Flaxseed Oil oil, 1 Tab by Does Not Apply route two (2) times a day., Disp: 1 Bottle, Rfl: 3  ???  DOCOSAHEXANOIC ACID/EPA (FISH OIL PO), Take 2 Caps by mouth two (2) times a day., Disp: , Rfl:   Allergies   Allergen Reactions   ??? Metoprolol Other (comments)  Patient states, made me feel horrible.     Past Medical History:   Diagnosis Date   ??? Morbid obesity due to excess calories (HCC) 04/24/2015     Family History   Problem Relation Age of Onset   ??? Stroke Mother    ??? Other Father      mrsa infxn caused death   ??? Heart Attack Brother        ROS: As per HPI otherwise negative.    Objective:   Visit Vitals   ??? BP 135/64   ??? Pulse 80   ??? Temp 99.6 ??F (37.6 ??C)   ??? Resp 20   ??? Ht 6\' 3"  (1.905 m)   ??? Wt (!) 363 lb (164.7 kg)   ??? SpO2 98%   ??? BMI 45.37 kg/m2     Gen: Well appearing.  No apparent distress.  Alert and oriented.  Responds to all questions appropriately.  Lungs: No labored respirations.  Talking in complete sentences without difficulty.  Musculoskeletal:   Posture: Normal   Deformity: None    Gait: Antalgic    Palpation:    L1-L5: No tenderness    Sacrum: No tenderness    Coccyx: No tenderness    Left Paraspinal: No tenderness    Right Paraspinal: No tenderness    TTP at the left ischial tuberosity     Strength (0-5/5)    Hip Flexion:   Left: 5/5  Right: 5/5    Hip Extension:  Left: 5/5  Right: 5/5    Hip Abduction:  Left: 5/5  Right: 5/5    Hip Adduction:  Left: 5/5  Right: 5/5    Knee Extension:  Left: 5/5  Right: 5/5    Knee Flexion:   Left: 5/5  Right: 5/5    Skin:   Right LE with edema 3+ pitting edema, skin breakdown present in the base of the 1st metatarsal on the plantar aspect/sesamoid area, no foul odor or purulent drainage. Erythema in the distal aspect of right foot.   +bilateral decreased sensation to light touch.     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       ASSESSMENT:  1. Left hip pain: H/o THA.  Xray demonstrates stable prosthesis.    2. Right foot cellulitis    PLAN:  Augmentin 875 BID for right foot cellulitis x 10 days.   Referral to Dr. Sharl Ma - history of bilateral THA and persistent left hip pain  Recommend patient to follow up with PCP Dr. Tacey Heap in 5-7 days for f/u of cellulitis. Sooner if sx change or worsen.       RTC: PRN

## 2016-07-28 NOTE — Progress Notes (Signed)
Chief Complaint   Patient presents with   ??? Leg Pain     X 2 months; very sharp and uncomfortable; Left leg

## 2016-07-30 NOTE — Addendum Note (Signed)
Addended by: Eugene GarnetESQUIVEL, Saphia Vanderford on: 07/30/2016 02:03 PM      Modules accepted: Orders

## 2016-08-04 ENCOUNTER — Encounter

## 2016-08-04 ENCOUNTER — Inpatient Hospital Stay: Admit: 2016-08-04 | Payer: MEDICARE | Attending: Foot & Ankle Surgery | Primary: Family Medicine

## 2016-08-04 DIAGNOSIS — M21611 Bunion of right foot: Secondary | ICD-10-CM

## 2016-08-06 ENCOUNTER — Encounter: Attending: Neurology | Primary: Family Medicine

## 2016-08-10 ENCOUNTER — Ambulatory Visit: Admit: 2016-08-10 | Discharge: 2016-08-10 | Payer: MEDICARE | Attending: Family Medicine | Primary: Family Medicine

## 2016-08-10 DIAGNOSIS — L03031 Cellulitis of right toe: Secondary | ICD-10-CM

## 2016-08-10 MED ORDER — DOXYCYCLINE 100 MG TAB
100 mg | ORAL_TABLET | Freq: Two times a day (BID) | ORAL | 0 refills | Status: AC
Start: 2016-08-10 — End: 2016-08-20

## 2016-08-10 NOTE — Progress Notes (Signed)
1. Have you been to the ER, urgent care clinic since your last visit?  Hospitalized since your last visit?No    2. Have you seen or consulted any other health care providers outside of the [facility] since your last visit?  Include any pap smears or colon screening. No  Reviewed record in preparation for visit and have necessary documentation  Pt did not bring medication to office visit for review    Goals that were addressed and/or need to be completed during or after this appointment include   Health Maintenance Due   Topic Date Due   ??? FOBT Q 1 YEAR AGE 9-75  12/22/1998   ??? Pneumococcal 65+ Low/Medium Risk (1 of 2 - PCV13) 12/21/2013   ??? MEDICARE YEARLY EXAM  07/30/2016

## 2016-08-10 NOTE — Progress Notes (Signed)
I saw and evaluated the patient, performing the key elements of the service.  I discussed the findings, assessment and plan with the resident and agree with the resident's findings and plan as documented in the resident's note.

## 2016-08-10 NOTE — Telephone Encounter (Signed)
Patient called and stated that the wound on his foot is not getting any better.  He has been to podiatry and that doctor was to send a report.  He would like a call back to discuss at [phone].  Please call today as he is going to be gone after today until next week.

## 2016-08-10 NOTE — Patient Instructions (Addendum)
Use Mole skin around the bulge of the bunion     Bunions: Care Instructions  Your Care Instructions    A bunion is a bump on the outside of the joint at the bottom of your big toe. It can cause pain and swelling in the toe. A bunion forms when bone or tissue around the joint becomes swollen from too much pressure. You also can have a bunionette, or tailor's bunion, which forms on the joint of the little toe. Sometimes, a bunion on the big toe turns the toe in toward the second toe. This is called displacement. It can lead to problems with the other toes.  You can get a bunion from having an unusual walking style, having flatfeet, or wearing tight-fitting shoes. You can treat most bunions at home with a few simple steps. If you have a lot of pain, your doctor may inject medicine into the bunion to reduce swelling for a while. If you still have pain, you may need to have surgery.  Follow-up care is a key part of your treatment and safety. Be sure to make and go to all appointments, and call your doctor if you are having problems. It's also a good idea to know your test results and keep a list of the medicines you take.  How can you care for yourself at home?  ?? Ask your doctor if you can take an over-the-counter pain medicine, such as acetaminophen (Tylenol), ibuprofen (Advil, Motrin), or naproxen (Aleve). Be safe with medicines. Read and follow all instructions on the label.  ?? Wear shoes that have a wide and deep space for the toes. Also, wear shoes that have low or flat heels and good arch supports. Do not wear tight, narrow, or high-heeled shoes.  ?? Try bunion pads, arch supports, toe spacers, or shoe inserts. They can help shift your weight when you walk to take pressure off your big toe.  ?? Put moleskin or another type of cushion on or around the bunion to keep it from rubbing against your shoe.  ?? Put ice or a cold pack on the area for 10 to 20 minutes at a time as  needed. Put a thin cloth between the ice and your skin.  ?? Prop up your foot on a pillow when you ice your toe or anytime you sit or lie down. Try to keep it above the level of your heart. This will help reduce swelling.  When should you call for help?  Call your doctor now or seek immediate medical care if:  ? ?? You have severe pain.   ? ?? Your toe is cool or pale or changes color.   ? ?? You have tingling, weakness, or numbness in the toe.   ?Watch closely for changes in your health, and be sure to contact your doctor if:  ? ?? Pain and swelling get worse.   ? ?? You do not get better as expected.   Where can you learn more?  Go to InsuranceStats.cahttp://www.healthwise.net/GoodHelpConnections.  Enter H210 in the search box to learn more about "Bunions: Care Instructions."  Current as of: June 17, 2015  Content Version: 11.4  ?? 2006-2017 Healthwise, Inc[employer]. Care instructions adapted under license by [employer] (which disclaims liability or warranty for this information). If you have questions about a medical condition or this instruction, always ask your healthcare professional. Healthwise, Incorporated disclaims any warranty or liability for your use of this information.

## 2016-08-10 NOTE — Progress Notes (Signed)
Progress Note    Patient: Elijah [name] MRN: [id number]  SSN: [id number]    Date of Birth: Jun 26, 1950  Age: 66 y.o.  Sex: male        Chief Complaint   Patient presents with   ??? Wound Check         Subjective:     Presents for follow up    Right leg cellulitis was diagnosed and treated by Dr. Mancel Bale.  Redness and swelling have gone down.      Bunion - saw Podiatry, not a surgical candidate per Podiatry due to Neuropathy.    Right foot ulcer - in the interim since last visit a right foot ulcer under 1st MCP worsened, but was debrided by Podiatry.  He has been painting it with betadyne daily and dressing it.  Feel like he can't get pressure off of it when he has his shoes on.    Ischial bursitis pain - improved following indocin and flexeril tx by Ortho.        Current and past medical information:    Current Medications after this visit::   Current Outpatient Prescriptions   Medication Sig   ??? cyclobenzaprine (FLEXERIL) 5 mg tablet Take 5 mg by mouth.   ??? indomethacin (INDOCIN) 50 mg capsule Take 50 mg by mouth.   ??? cpap machine kit by Does Not Apply route.   ??? doxycycline (ADOXA) 100 mg tablet Take 1 Tab by mouth two (2) times a day for 10 days.   ??? furosemide (LASIX) 20 mg tablet Take 1 Tab by mouth daily.   ??? atorvastatin (LIPITOR) 40 mg tablet TAKE 1 TABLET BY MOUTH EVERY DAY   ??? coenzyme q10 10 mg cap Take 1 Tab by mouth.   ??? DULoxetine (CYMBALTA) 60 mg capsule Take 1 Cap by mouth nightly.   ??? DULoxetine (CYMBALTA) 30 mg capsule Take 1 Cap by mouth nightly.   ??? amLODIPine (NORVASC) 5 mg tablet TAKE ONE TABLET BY MOUTH ONCE DAILY FOR HYPERTENSION   ??? pregabalin (LYRICA) 300 mg capsule Take 1 Cap by mouth two (2) times a day. Max Daily Amount: 600 mg. 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 triamcinolone acetonide (KENALOG) 0.1 % topical cream Apply  to affected area two (2) times a day. use thin layer    ??? potassium chloride SR (KLOR-CON 10) 10 mEq tablet Take 1 Tab by mouth daily.   ??? lisinopril (PRINIVIL, ZESTRIL) 20 mg tablet Take 1 Tab by mouth daily.   ??? nebivolol (BYSTOLIC) 10 mg tablet Take 1 Tab by mouth daily.   ??? aspirin (ASPIRIN) 325 mg tablet Take 1 Tab by mouth daily.   ??? Flaxseed Oil oil 1 Tab by Does Not Apply route two (2) times a day.   ??? DOCOSAHEXANOIC ACID/EPA (FISH OIL PO) Take 2 Caps by mouth two (2) times a day.     No current facility-administered medications for this visit.        Patient Active Problem List    Diagnosis Date Noted   ??? Polyneuropathy 05/28/2016   ??? Tubular adenoma of colon 09/23/2015   ??? Morbid obesity due to excess calories ([place]) 04/24/2015   ??? Essential hypertension, benign 02/21/2015   ??? HLD (hyperlipidemia) 02/21/2015   ??? Coronary atherosclerosis of native coronary artery 02/21/2015   ??? OSA on CPAP 02/21/2015   ??? Pure hypercholesterolemia 08/23/2013   ??? 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 Benign  prostatic hyperplasia 12/29/2006       Past Medical History:   Diagnosis Date   ??? Morbid obesity due to excess calories ([place]) 04/24/2015       Allergies   Allergen Reactions   ??? Metoprolol Other (comments)     Patient states, made me feel horrible.       Past Surgical History:   Procedure Laterality Date   ??? HX HIP REPLACEMENT Bilateral        Social History     Social History   ??? Marital status: MARRIED     Spouse name: N/A   ??? Number of children: N/A   ??? Years of education: N/A     Social History Main Topics   ??? Smoking status: Former Smoker     Types: Cigarettes   ??? Smokeless tobacco: Never Used   ??? Alcohol use No      Comment: recently has stopped   ??? Drug use: No   ??? Sexual activity: Not Asked     Other Topics Concern   ??? None     Social History Narrative       Review of Systems   Constitutional: Negative for chills and fever.   Gastrointestinal: Negative for nausea and vomiting.   Skin: Negative for itching and rash.        Objective:     Vitals:     08/10/16 1544   BP: 124/70   Pulse: 67   Resp: 20   Temp: 97.4 ??F (36.3 ??C)   TempSrc: Oral   SpO2: 93%   Weight: 347 lb (157.4 kg)   Height: '6\' 3"'  (1.905 m)      Body mass index is 43.37 kg/(m^2).    Physical Exam   Constitutional: He is oriented to person, place, and time and well-developed, well-nourished, and in no distress. No distress.   HENT:   Head: Normocephalic and atraumatic.   Eyes: Conjunctivae and EOM are normal. Right eye exhibits no discharge. Left eye exhibits no discharge. No scleral icterus.   Cardiovascular: Normal rate and regular rhythm.  Exam reveals no gallop and no friction rub.    No murmur heard.  Pulmonary/Chest: Effort normal. He has no wheezes. He has no rales.   Abdominal: Soft. Bowel sounds are normal. He exhibits no distension. There is no tenderness.   Musculoskeletal: He exhibits no edema or tenderness.   Right first hallux valgus deformity.  Right second hammer toe   Neurological: He is alert and oriented to person, place, and time. No cranial nerve deficit.   Skin: Skin is warm and dry. He is not diaphoretic.   There is still some erythema at outer edges of the right medial foot including the toes, but not including the forefoot or ankle. There is 1x.5cm ulcer on the edge of the first MTP without epithelialization   Nursing note and vitals reviewed.          Assessment and orders:     Diagnoses and all orders for this visit:    1. Cellulitis of toe of right foot  -     doxycycline (ADOXA) 100 mg tablet; Take 1 Tab by mouth two (2) times a day for 10 days.  Treat to resolution, will give a broader coverage this time w doxy    2. Bunion of great toe of right foot        -      Discussed mole skin and inserts, as well as cutting out  a section of the sneakers he uses at home    3. Hammer toe of right foot        -      Recommended inserts    4. Skin ulcer of right foot with fat layer exposed ([place])          - continue betadyne and abx coverage and reduction of pressure as above           Plan of care:  Discussed diagnoses in detail with patient.     Medication risks/benefits/side effects discussed with patient.     All of the patient's questions were addressed. The patient understands and agrees with our plan of care.    The patient knows to call back if they are unsure of or forget any changes we discussed today or if the symptoms change.     The patient received an After-Visit Summary which contains VS, orders, medication list and allergy list. This can be used as a "mini-medical record" should they have to seek medical care while out of town.    Patient Care Team:  [name], MD as PCP - General (Family Practice)  Cammy Copa, MD (Cardiology)    Follow-up Disposition: Not on File    Future Appointments  Date Time Provider [place]   09/08/2016 9:40 AM Vipal Treylen Gibbs Nones, MD [place]   10/18/2016 2:00 PM Cam Hai, MD [place]       Signed By: Redge Gainer, MD     Aug 10, 2016

## 2016-08-10 NOTE — Telephone Encounter (Signed)
Spoke to patient regarding Dr. Mliss SaxSpence advising him to see doctor or podiatrist concerning wound on foot. Pt states he had an appt with MD today.

## 2016-08-23 ENCOUNTER — Encounter

## 2016-08-24 ENCOUNTER — Ambulatory Visit: Admit: 2016-08-24 | Discharge: 2016-08-24 | Payer: MEDICARE | Attending: Family Medicine | Primary: Family Medicine

## 2016-08-24 DIAGNOSIS — M7989 Other specified soft tissue disorders: Secondary | ICD-10-CM

## 2016-08-24 MED ORDER — DOXYCYCLINE 100 MG TAB
100 mg | ORAL_TABLET | Freq: Two times a day (BID) | ORAL | 0 refills | Status: DC
Start: 2016-08-24 — End: 2016-09-03

## 2016-08-24 MED ORDER — LISINOPRIL 20 MG TAB
20 mg | ORAL_TABLET | ORAL | 2 refills | Status: DC
Start: 2016-08-24 — End: 2016-09-21

## 2016-08-24 NOTE — Progress Notes (Signed)
I saw and evaluated the patient, performing the key elements of the service.  I discussed the findings, assessment and plan with the resident and agree with the resident's findings and plan as documented in the resident's note.

## 2016-08-24 NOTE — Progress Notes (Signed)
Progress Note    Patient: Elijah [name] MRN: [id number]  SSN: [id number]    Date of Birth: [birth year]  Age: 66 y.o.  Sex: male        Chief Complaint   Patient presents with   ??? Foot Swelling         Subjective:     Elijah [name] presents again to discuss his right leg.     Swelling - has not improved.  Previous workup of venous doppler negative for DVT and venous insufficiency but did show some inguinal lymphadenopathy so he was placed on abx.  Was on augmentin and then doxy, which he is just finishing.      Redness - the leg is still red without much improvement and he is concerned about infection.    Foot ulcers - last visit we addressed his foot ulcers and recommended mole skin to offload pressure.  He obtained the moleskin but wasn't sure how to go about getting the best result so did not use them.  He does have the moleskin with him today.      He is traveling to [facility]. [employer].    Bunion and hammer toe - the hammer toe (2nd phalanx) is starting to become painful as it pushes up more against the top of his shoe, he thinks likely due to the pressure from the bunion.    Current and past medical information:    Current Medications after this visit::     Current Outpatient Prescriptions   Medication Sig   ??? lisinopril (PRINIVIL, ZESTRIL) 20 mg tablet TAKE ONE TABLET BY MOUTH ONCE DAILY   ??? doxycycline (ADOXA) 100 mg tablet Take 1 Tab by mouth two (2) times a day for 10 days.   ??? cyclobenzaprine (FLEXERIL) 5 mg tablet Take 5 mg by mouth.   ??? indomethacin (INDOCIN) 50 mg capsule Take 50 mg by mouth.   ??? cpap machine kit by Does Not Apply route.   ??? furosemide (LASIX) 20 mg tablet Take 1 Tab by mouth daily.   ??? atorvastatin (LIPITOR) 40 mg tablet TAKE 1 TABLET BY MOUTH EVERY DAY   ??? coenzyme q10 10 mg cap Take 1 Tab by mouth.   ??? DULoxetine (CYMBALTA) 60 mg capsule Take 1 Cap by mouth nightly.   ??? DULoxetine (CYMBALTA) 30 mg capsule Take 1 Cap by mouth nightly.    ??? amLODIPine (NORVASC) 5 mg tablet TAKE ONE TABLET BY MOUTH ONCE DAILY FOR HYPERTENSION   ??? pregabalin (LYRICA) 300 mg capsule Take 1 Cap by mouth two (2) times a day. Max Daily Amount: 600 mg. 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 triamcinolone acetonide (KENALOG) 0.1 % topical cream Apply  to affected area two (2) times a day. use thin layer   ??? potassium chloride SR (KLOR-CON 10) 10 mEq tablet Take 1 Tab by mouth daily.   ??? nebivolol (BYSTOLIC) 10 mg tablet Take 1 Tab by mouth daily.   ??? aspirin (ASPIRIN) 325 mg tablet Take 1 Tab by mouth daily.   ??? Flaxseed Oil oil 1 Tab by Does Not Apply route two (2) times a day.   ??? DOCOSAHEXANOIC ACID/EPA (FISH OIL PO) Take 2 Caps by mouth two (2) times a day.     No current facility-administered medications for this visit.        Patient Active Problem List    Diagnosis Date Noted   ??? Polyneuropathy 05/28/2016   ??? Tubular adenoma  of colon 09/23/2015   ??? Morbid obesity due to excess calories ([place]) 04/24/2015   ??? Essential hypertension, benign 02/21/2015   ??? HLD (hyperlipidemia) 02/21/2015   ??? Coronary atherosclerosis of native coronary artery 02/21/2015   ??? OSA on CPAP 02/21/2015   ??? Pure hypercholesterolemia 08/23/2013   ??? 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 Benign prostatic hyperplasia 12/29/2006       Past Medical History:   Diagnosis Date   ??? BPH (benign prostatic hyperplasia)    ??? CAD (coronary artery disease)    ??? HLD (hyperlipidemia)    ??? HTN (hypertension)    ??? Morbid obesity due to excess calories ([place]) 04/24/2015   ??? OSA (obstructive sleep apnea)    ??? Polyneuropathy    ??? Tubular adenoma of colon     x2 on colonoscopy 09/18/15       Allergies   Allergen Reactions   ??? Metoprolol Other (comments)     Patient states, made me feel horrible.       Past Surgical History:   Procedure Laterality Date   ??? HX HIP REPLACEMENT Bilateral        Social History     Social History   ??? Marital status: MARRIED      Spouse name: N/A   ??? Number of children: N/A   ??? Years of education: N/A     Social History Main Topics   ??? Smoking status: Former Smoker     Types: Cigarettes   ??? Smokeless tobacco: Never Used   ??? Alcohol use No      Comment: recently has stopped   ??? Drug use: No   ??? Sexual activity: Not Asked     Other Topics Concern   ??? None     Social History Narrative       Review of Systems   Constitutional: Negative for chills and fever.   Respiratory: Negative for cough, hemoptysis and shortness of breath.    Cardiovascular: Negative for chest pain, palpitations, orthopnea, claudication and PND.   Genitourinary: Negative for dysuria and urgency.        Objective:     Vitals:    08/24/16 1355   BP: 102/62   Pulse: 88   Resp: 20   Temp: 97.9 ??F (36.6 ??C)   TempSrc: Oral   SpO2: 97%   Weight: 347 lb (157.4 kg)   Height: '6\' 3"'  (1.905 m)      Body mass index is 43.37 kg/(m^2).    Physical Exam   Constitutional: He is oriented to person, place, and time and well-developed, well-nourished, and in no distress. No distress.   HENT:   Head: Normocephalic and atraumatic.   Eyes: Conjunctivae and EOM are normal. Right eye exhibits no discharge. Left eye exhibits no discharge. No scleral icterus.   Cardiovascular: Normal rate and regular rhythm.  Exam reveals no gallop and no friction rub.    No murmur heard.  Pulmonary/Chest: Effort normal. He has no wheezes. He has no rales.   Abdominal: Soft. Bowel sounds are normal. He exhibits no distension. There is no tenderness.   Musculoskeletal:   Right leg with significant unchanged edema.  Right great toe bunion with faint erythema.  Right 2nd phlananx hammer toe.  There has been interval healing and epithelialization of toe ulcers.  The sole of the foot has 2 essentially unchanged ulcers 1x.5cm through the dermis under the 1st and 2nd MTP joints.   Neurological: He is  alert and oriented to person, place, and time. No cranial nerve deficit.    Skin: Skin is warm and dry. He is not diaphoretic.   Nursing note and vitals reviewed.        Health Maintenance Due   Topic Date Due   ??? FOBT Q 1 YEAR AGE 84-75  12/22/1998   ??? Pneumococcal 65+ Low/Medium Risk (1 of 2 - PCV13) 12/21/2013   ??? MEDICARE YEARLY EXAM  07/30/2016         Assessment and orders:     Encounter Diagnoses     ICD-10-CM ICD-9-CM   1. Leg swelling M79.89 729.81   2. Bunion of great toe of right foot M21.611 727.1   3. Hammer toe of right foot M20.41 735.4   4. Skin ulcer of right foot with fat layer exposed ([place]) L97.512 707.15     Diagnoses and all orders for this visit:    1. Leg swelling - will get stat ddimer as a small percentage of proximal DVTs may not be visualized with [place], considering this a must not miss at this point regardless of normal vitals since he is about to get on a plane.  -     D DIMER    2. Bunion of great toe of right foot    3. Hammer toe of right foot    4. Skin ulcer of right foot with fat layer exposed ([place]) - will continue another course of doxy as he hasn't quote had healing.  We applied moleskin in double layer and made them adherent to his foot rather than his shoe.  PLaced bandaid over this.  He understands the concept now and will repeat it at home.  -     doxycycline (ADOXA) 100 mg tablet; Take 1 Tab by mouth two (2) times a day for 10 days.            Plan of care:  Discussed diagnoses in detail with patient.     Medication risks/benefits/side effects discussed with patient.     All of the patient's questions were addressed. The patient understands and agrees with our plan of care.    The patient knows to call back if they are unsure of or forget any changes we discussed today or if the symptoms change.     The patient received an After-Visit Summary which contains VS, orders, medication list and allergy list. This can be used as a "mini-medical record" should they have to seek medical care while out of town.    Patient Care Team:   [name], MD as PCP - General (Family Practice)  Cammy Copa, MD (Cardiology)    Follow-up Disposition: Not on File    Future Appointments  Date Time Provider [place]   09/08/2016 9:40 AM Vipal Shadoe Bethel Nones, MD [place]   10/18/2016 2:00 PM Cam Hai, MD [place]       Signed By: Redge Gainer, MD     Aug 24, 2016

## 2016-08-24 NOTE — Patient Instructions (Signed)
Bunions: Care Instructions  Your Care Instructions    A bunion is a bump on the outside of the joint at the bottom of your big toe. It can cause pain and swelling in the toe. A bunion forms when bone or tissue around the joint becomes swollen from too much pressure. You also can have a bunionette, or tailor's bunion, which forms on the joint of the little toe. Sometimes, a bunion on the big toe turns the toe in toward the second toe. This is called displacement. It can lead to problems with the other toes.  You can get a bunion from having an unusual walking style, having flatfeet, or wearing tight-fitting shoes. You can treat most bunions at home with a few simple steps. If you have a lot of pain, your doctor may inject medicine into the bunion to reduce swelling for a while. If you still have pain, you may need to have surgery.  Follow-up care is a key part of your treatment and safety. Be sure to make and go to all appointments, and call your doctor if you are having problems. It's also a good idea to know your test results and keep a list of the medicines you take.  How can you care for yourself at home?  ?? Ask your doctor if you can take an over-the-counter pain medicine, such as acetaminophen (Tylenol), ibuprofen (Advil, Motrin), or naproxen (Aleve). Be safe with medicines. Read and follow all instructions on the label.  ?? Wear shoes that have a wide and deep space for the toes. Also, wear shoes that have low or flat heels and good arch supports. Do not wear tight, narrow, or high-heeled shoes.  ?? Try bunion pads, arch supports, toe spacers, or shoe inserts. They can help shift your weight when you walk to take pressure off your big toe.  ?? Put moleskin or another type of cushion on or around the bunion to keep it from rubbing against your shoe.  ?? Put ice or a cold pack on the area for 10 to 20 minutes at a time as needed. Put a thin cloth between the ice and your skin.   ?? Prop up your foot on a pillow when you ice your toe or anytime you sit or lie down. Try to keep it above the level of your heart. This will help reduce swelling.  When should you call for help?  Call your doctor now or seek immediate medical care if:  ? ?? You have severe pain.   ? ?? Your toe is cool or pale or changes color.   ? ?? You have tingling, weakness, or numbness in the toe.   ?Watch closely for changes in your health, and be sure to contact your doctor if:  ? ?? Pain and swelling get worse.   ? ?? You do not get better as expected.   Where can you learn more?  Go to http://www.healthwise.net/[employer]Help[employer]ctions.  Enter H210 in the search box to learn more about "Bunions: Care Instructions."  Current as of: June 17, 2015  Content Version: 11.4  ?? 2006-2017 [employer]wise[employer]. Care instructions adapted under license by [employer] (which disclaims liability or warranty for this information). If you have questions about a medical condition or this instruction, always ask your healthcare professional. Healthwise, Incorporated disclaims any warranty or liability for your use of this information.

## 2016-08-24 NOTE — Progress Notes (Signed)
1. Have you been to the ER, urgent care clinic since your last visit?  Hospitalized since your last visit?No    2. Have you seen or consulted any other health care providers outside of the [facility] since your last visit?  Include any pap smears or colon screening. No  Reviewed record in preparation for visit and have necessary documentation  Pt did not bring medication to office visit for review    Goals that were addressed and/or need to be completed during or after this appointment include   Health Maintenance Due   Topic Date Due   ??? FOBT Q 1 YEAR AGE 61-75  12/22/1998   ??? Pneumococcal 65+ Low/Medium Risk (1 of 2 - PCV13) 12/21/2013   ??? MEDICARE YEARLY EXAM  07/30/2016

## 2016-08-25 LAB — HEMOGLOBIN A1C WITH EAG
Estimated average glucose: 103 mg/dL
Hemoglobin A1c: 5.2 % (ref 4.8–5.6)

## 2016-09-02 ENCOUNTER — Encounter

## 2016-09-02 ENCOUNTER — Ambulatory Visit: Admit: 2016-09-02 | Discharge: 2016-09-02 | Payer: MEDICARE | Attending: Family Medicine | Primary: Family Medicine

## 2016-09-02 DIAGNOSIS — I10 Essential (primary) hypertension: Secondary | ICD-10-CM

## 2016-09-02 NOTE — Progress Notes (Signed)
Progress Note    Patient: Elijah [name] MRN: [id number]  SSN: [id number]    Date of Birth: 1950-03-30  Age: 66 y.o.  Sex: male        Chief Complaint   Patient presents with   ??? Foot Swelling     Right          Subjective:     Encounter Diagnoses   Name Primary?   ??? Essential hypertension, benign:  BP Readings from Last 3 Encounters:   09/02/16 108/71   08/24/16 102/62   08/10/16 124/70     The patient reports:  taking medications as instructed, no medication side effects noted, no TIA's, no chest pain on exertion, no dyspnea on exertion, no swelling of ankles.    Key CAD CHF Meds             lisinopril (PRINIVIL, ZESTRIL) 20 mg tablet  (Taking) TAKE ONE TABLET BY MOUTH ONCE DAILY    atorvastatin (LIPITOR) 40 mg tablet  (Taking) TAKE 1 TABLET BY MOUTH EVERY DAY    amLODIPine (NORVASC) 5 mg tablet  (Taking) TAKE ONE TABLET BY MOUTH ONCE DAILY FOR HYPERTENSION    nebivolol (BYSTOLIC) 10 mg tablet  (Taking) Take 1 Tab by mouth daily.    aspirin (ASPIRIN) 325 mg tablet  (Taking) Take 1 Tab by mouth daily.    DOCOSAHEXANOIC ACID/EPA (FISH OIL PO)  (Taking) Take 2 Caps by mouth two (2) times a day.    furosemide (LASIX) 20 mg tablet Take 1 Tab by mouth daily.           Lab Results   Component Value Date/Time    Sodium 141 06/22/2016 11:17 AM    Potassium 4.0 06/22/2016 11:17 AM    Chloride 103 06/22/2016 11:17 AM    CO2 23 06/22/2016 11:17 AM    Glucose 131 (H) 06/22/2016 11:17 AM    BUN 18 06/22/2016 11:17 AM    Creatinine 0.95 06/22/2016 11:17 AM    BUN/Creatinine ratio 19 06/22/2016 11:17 AM    GFR est AA 95 06/22/2016 11:17 AM    GFR est non-AA 82 06/22/2016 11:17 AM    Calcium 8.9 06/22/2016 11:17 AM    Bilirubin, total 0.6 06/22/2016 11:17 AM    AST (SGOT) 23 06/22/2016 11:17 AM    Alk. phosphatase 71 06/22/2016 11:17 AM    Protein, total 6.6 06/22/2016 11:22 AM    Albumin 3.9 06/22/2016 11:17 AM    A-G Ratio 1.4 06/22/2016 11:17 AM    ALT (SGPT) 21 06/22/2016 11:17 AM     Low salt diet?yes  Aerobic exercise? yes   Our goal is to normalize the blood pressure to decrease the risks of strokes and heart attacks. The patient is in agreement with the plan.     Yes   ??? Pure hypercholesterolemia:  Hyperlipidemia: He will need repeat testing soon.  Cardiovascular risks necessitate normal particle counts if possible.  Currently he takes Lipitor (atorvastatin) , 40 mg  Results for orders placed or performed in visit on 06/21/16   NMR LIPOPROFILE     Status: Abnormal   Result Value Ref Range Status    LDL-P 1381 (H) <1000 nmol/L Final     Comment:                           Low                   <  1000                            Moderate         1000 - 1299                            Borderline-High  1300 - 1599                            High             1600 - 2000                            Very High             > 2000      LDL-C 58 0 - 99 mg/dL Final     Comment:                           Optimal               <  100                            Above optimal     100 -  129                            Borderline        130 -  159                            High              160 -  189                            Very high             >  189  LDL-C is inaccurate if patient is non-fasting.      HDL-C 40 >39 mg/dL Final    Triglycerides 322 (H) 0 - 149 mg/dL Final    Cholesterol, Total 162 100 - 199 mg/dL Final    HDL-P (Total) 29.2 (L) >=30.5 umol/L Final    Small LDL-P 1022 (H) <=527 nmol/L Final    LDL size 19.7 >20.5 nm Final     Comment:  ----------------------------------------------------------                   ** INTERPRETATIVE INFORMATION**                   PARTICLE CONCENTRATION AND SIZE                      <--Lower CVD Risk   Higher CVD Risk-->    LDL AND HDL PARTICLES   Percentile in Reference Population    HDL-P (total)        High     75th    50th    25th   Low                         >34.9  34.9    30.5    26.7   <26.7    Small LDL-P          Low      25th    50th    75th   High                          <117     117     527     839    >839    LDL Size   <-Large (Pattern A)->    <-Small (Pattern B)->                      23.0    20.6           20.5      19.0   ----------------------------------------------------------  Small LDL-P and LDL Size are associated with CVD risk, but not after  LDL-P is taken into account.  These assays were developed and their performance characteristics  determined by [employer]. These assays have not been cleared by the  [place] Food and Drug Administration. The clinical utility o  f these  laboratory values have not been fully established.      LP-IR SCORE 59 (H) <=45 Final     Comment: INSULIN RESISTANCE MARKER      <--Insulin Sensitive    Insulin Resistant-->             Percentile in Reference Population  Insulin Resistance Score  LP-IR Score   Low   25th   50th   75th   High                <27   27     45     63     >63  LP-IR Score is inaccurate if patient is non-fasting.  The LP-IR score is a laboratory developed index that has been  associated with insulin resistance and diabetes risk and should be  used as one component of a physician's clinical assessment. The  LP-IR score listed above has not been cleared by the [place] Food and  Drug Administration.      Narrative    Performed at:  [address]  [address] [place], [place]  [id number]  Lab Director: Lindon Romp MD, Phone:  [phone]     Lab Results   Component Value Date/Time    AST (SGOT) 23 06/22/2016 11:17 AM    Alk. phosphatase 71 06/22/2016 11:17 AM     Myalgias: No  Fatigue: No  Wt Readings from Last 3 Encounters:   09/02/16 347 lb 3.2 oz (157.5 kg)   08/24/16 347 lb (157.4 kg)   08/10/16 347 lb (157.4 kg)       The patient is aware of our goal to reduce or eliminate the long term problems (such as strokes and heart attacks) related to poorly controlled hyperlipidemia.  The accelerated atherogenicity of small LDL particles and too many particles was discussed using a model artery.         ??? Atherosclerosis of native coronary artery of native heart without angina pectoris:  No chest pain, DOE or SOB. No PND or orthopnea.      ??? Polyneuropathy: severe. A1C is normal. Has seen Neurology.        ??? Bunion of great toe: Associated with swelling and hyperemia.  There is no significant warmth to his distal foot.  His Doppler pulses are strong and intact.        ??? Skin ulcer of right foot, limited to breakdown of skin [facility]): See the drawing in the physical exam portion.  Both ulcers were cultured.  Some scant discharge during the exam.        ??? OSA on CPAP: CPAP needs to be retested.  If he can arrange an earlier test [facility] I am all for that.        ??? S/P CABG (coronary artery bypass graft): No chest pain, DOE or SOB. No PND or orthopnea.          Current and past medical information:    Current Medications after this visit::   Current Outpatient Prescriptions   Medication Sig   ??? lisinopril (PRINIVIL, ZESTRIL) 20 mg tablet TAKE ONE TABLET BY MOUTH ONCE DAILY   ??? doxycycline (ADOXA) 100 mg tablet Take 1 Tab by mouth two (2) times a day for 10 days.   ??? atorvastatin (LIPITOR) 40 mg tablet TAKE 1 TABLET BY MOUTH EVERY DAY   ??? coenzyme q10 10 mg cap Take 1 Tab by mouth.   ??? DULoxetine (CYMBALTA) 60 mg capsule Take 1 Cap by mouth nightly.   ??? DULoxetine (CYMBALTA) 30 mg capsule Take 1 Cap by mouth nightly.   ??? amLODIPine (NORVASC) 5 mg tablet TAKE ONE TABLET BY MOUTH ONCE DAILY FOR HYPERTENSION   ??? pregabalin (LYRICA) 300 mg capsule Take 1 Cap by mouth two (2) times a day. Max Daily Amount: 600 mg. 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 nebivolol (BYSTOLIC) 10 mg tablet Take 1 Tab by mouth daily.   ??? aspirin (ASPIRIN) 325 mg tablet Take 1 Tab by mouth daily.   ??? Flaxseed Oil oil 1 Tab by Does Not Apply route two (2) times a day.   ??? DOCOSAHEXANOIC ACID/EPA (FISH OIL PO) Take 2 Caps by mouth two (2) times a day.    ??? cyclobenzaprine (FLEXERIL) 5 mg tablet Take 5 mg by mouth.   ??? cpap machine kit by Does Not Apply route.   ??? furosemide (LASIX) 20 mg tablet Take 1 Tab by mouth daily.   ??? triamcinolone acetonide (KENALOG) 0.1 % topical cream Apply  to affected area two (2) times a day. use thin layer     No current facility-administered medications for this visit.        Patient Active Problem List    Diagnosis Date Noted   ??? Polyneuropathy 05/28/2016   ??? Tubular adenoma of colon 09/23/2015   ??? Morbid obesity due to excess calories ([place]) 04/24/2015   ??? Essential hypertension, benign 02/21/2015   ??? HLD (hyperlipidemia) 02/21/2015   ??? Coronary atherosclerosis of native coronary artery 02/21/2015   ??? OSA on CPAP 02/21/2015   ??? Pure hypercholesterolemia 08/23/2013   ??? 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 Benign prostatic hyperplasia 12/29/2006       Past Medical History:   Diagnosis Date   ??? BPH (benign prostatic hyperplasia)    ??? CAD (coronary artery disease)    ??? HLD (hyperlipidemia)    ??? HTN (hypertension)    ??? Morbid obesity due to excess calories ([place]) 04/24/2015   ??? OSA (obstructive sleep apnea)    ??? Polyneuropathy    ??? Tubular adenoma of colon     x2 on colonoscopy 09/18/15       Allergies   Allergen Reactions   ???  Metoprolol Other (comments)     Patient states, made me feel horrible.       Past Surgical History:   Procedure Laterality Date   ??? HX HIP REPLACEMENT Bilateral        Social History     Social History   ??? Marital status: MARRIED     Spouse name: N/A   ??? Number of children: N/A   ??? Years of education: N/A     Social History Main Topics   ??? Smoking status: Former Smoker     Types: Cigarettes   ??? Smokeless tobacco: Never Used   ??? Alcohol use No      Comment: recently has stopped   ??? Drug use: No   ??? Sexual activity: Not on file     Other Topics Concern   ??? Not on file     Social History Narrative       Review of Systems   Constitutional: Negative.  Negative for chills, fever, malaise/fatigue and  weight loss.   HENT: Negative.  Negative for hearing loss.    Eyes: Negative.  Negative for blurred vision and double vision.   Respiratory: Negative.  Negative for cough, hemoptysis, sputum production and shortness of breath.    Cardiovascular: Negative.  Negative for chest pain, palpitations and orthopnea.   Gastrointestinal: Negative.  Negative for abdominal pain, blood in stool, heartburn, nausea and vomiting.   Genitourinary: Negative.  Negative for dysuria, frequency and urgency.   Musculoskeletal: Negative.  Negative for back pain, myalgias and neck pain.   Skin: Negative.  Negative for rash.   Neurological: Negative.  Negative for dizziness, tingling, tremors, weakness and headaches.   Endo/Heme/Allergies: Negative.    Psychiatric/Behavioral: Negative.  Negative for depression.       Objective:     Vitals:    09/02/16 1435   BP: 108/71   Pulse: 62   Resp: 20   Temp: 98.6 ??F (37 ??C)   TempSrc: Oral   SpO2: 98%   Weight: 347 lb 3.2 oz (157.5 kg)   Height: '6\' 3"'  (1.905 m)      Body mass index is 43.4 kg/(m^2).    Physical Exam   Constitutional: He is oriented to person, place, and time and well-developed, well-nourished, and in no distress.   HENT:   Head: Normocephalic and atraumatic.   Mouth/Throat: Oropharynx is clear and moist.   Eyes: Right eye exhibits no discharge. Left eye exhibits no discharge. No scleral icterus.   Neck: No tracheal deviation present. No thyromegaly present.   No bruit.   Cardiovascular: Normal rate, regular rhythm and normal heart sounds.    Pulmonary/Chest: Effort normal and breath sounds normal.   Abdominal: Soft.   Musculoskeletal:        Feet:    Ulcerations as marked.  Through the dermis.   Neurological: He is alert and oriented to person, place, and time.   Severe decrease in sensation to his mid shin.  His right foot is hyperemic in the distal portion.  It is also swollen.  He cannot feel the two foot ulcerations.   Skin: No rash noted. There is erythema.    He has 2 foot ulcers on the right foot they intermittently draining some serosanguineous fluid.  A culture was taken.  Is been 6 weeks now since he stepped on a plastic light fixture that punctured his his foot in these 2 places.  He is convinced that both of these ulcers were at the  sites of where the plastic cut his foot while he was in the shower.  Apparently remodeled said left the plastic in the bottom of the shower after doing a repair and he was not aware that there are plastic fragments there.  His foot is certainly swollen.  It is hyperemic more so than the left foot and it is significantly swollen compared to the left foot.  At this point time I think he needs an MRI to rule out a deep tissue infection and/or bone infection in his foot.  In my absence apparently has seen Dr. Buddy Duty aqnd I will request those records.   Psychiatric: Mood and affect normal.   Nursing note and vitals reviewed.    Health Maintenance Due   Topic Date Due   ??? FOBT Q 1 YEAR AGE 64-75  12/22/1998   ??? Pneumococcal 65+ Low/Medium Risk (1 of 2 - PCV13) 12/21/2013   ??? MEDICARE YEARLY EXAM  07/30/2016   ??? GLAUCOMA SCREENING Q2Y  10/27/2016         Assessment and orders:     Encounter Diagnoses     ICD-10-CM ICD-9-CM   1. Essential hypertension, benign I10 401.1   2. Pure hypercholesterolemia E78.00 272.0   3. Atherosclerosis of native coronary artery of native heart without angina pectoris I25.10 414.01   4. Polyneuropathy G62.9 356.9   5. Bunion of great toe M21.619 727.1   6. Skin ulcer of right foot, limited to breakdown of skin ([place]) L97.511 707.15   7. OSA on CPAP G47.33 327.23    Z99.89 V46.8   8. S/P CABG (coronary artery bypass graft) Z95.1 V45.81     Diagnoses and all orders for this visit:    1. Essential hypertension, benign-blood pressures well controlled  -     NMR LIPOPROFILE  -     METABOLIC PANEL, COMPREHENSIVE    2. Pure hypercholesterolemia: I reviewed his cholesterol it was much higher 3 months ago that was last fall   -     NMR LIPOPROFILE  -     CBC WITH AUTOMATED DIFF  -     METABOLIC PANEL, COMPREHENSIVE    3. Atherosclerosis of native coronary artery of native heart without angina pectoris-he has no signs of active cardiac disease  -     NMR LIPOPROFILE  -     METABOLIC PANEL, COMPREHENSIVE    4. Polyneuropathy-severe dense sensory loss with pain at night up to his mid shin.    5. Bunion of great toe he has a significant bunion on his right great toe and calluses on the posterior aspect of several toes.  -     REFERRAL TO ORTHOPEDICS  -     MRI FOOT RT WO CONT; Future    6. Skin ulcer of right foot, limited to breakdown of skin (HCC)-the skin ulceration appears to be limited to the skin.  I do not see any penetration into the subcutaneous level  -     REFERRAL TO ORTHOPEDICS  -     MRI FOOT RT WO CONT; Future  -     AEROBIC BACTERIAL CULTURE  -     CBC WITH AUTOMATED DIFF  -     CRP, HIGH SENSITIVITY; Future    7. OSA on CPAP-he is awaiting repeat testing to make sure his OSA is controlled-currently scheduled for July.  He will try to call [facility] and see if they have an earlier appointment and if so I will try  to get a consult there. He has been diligent and lost 16 pounds since 1 May.  Was afraid that his sleep apnea was preventing weight loss.    8. S/P CABG (coronary artery bypass graft) clinically stable.-      Follow-up Disposition:  Return in about 2 weeks (around 09/16/2016).        Plan of care:  Discussed diagnoses in detail with patient.     Medication risks/benefits/side effects discussed with patient.     All of the patient's questions were addressed. The patient understands and agrees with our plan of care.    The patient knows to call back if they are unsure of or forget any changes we discussed today or if the symptoms change.     The patient received an After-Visit Summary which contains VS, orders, medication list and allergy list. This can be used as a "mini-medical  record" should they have to seek medical care while out of town.    Patient Care Team:  [name], MD as PCP - General (Family Practice)  Cammy Copa, MD (Cardiology)  Verdene Lennert, MD (Orthopedic Surgery)  Raynaldo Opitz. White, MD (Orthopedic Surgery)    Follow-up Disposition:  Return in about 2 weeks (around 09/16/2016).    Future Appointments  Date Time Provider [place]   09/08/2016 9:40 AM Vipal Daniel Nones, MD [facility] [place] SCHED   09/15/2016 11:10 AM [name], MD [facility] [place] SCHED   10/18/2016 2:00 PM Cam Hai, MD [place]       Signed By: [name], MD     September 02, 2016

## 2016-09-02 NOTE — Progress Notes (Signed)
Chief Complaint   Patient presents with   ??? Foot Swelling     Right      Body mass index is 43.4 kg/(m^2).    1. Have you been to the ER, urgent care clinic since your last visit?  Hospitalized since your last visit?NO    2. Have you seen or consulted any other health care providers outside of the [facility] since your last visit?  Include any pap smears or colon screening. NO    Reviewed record in preparation for visit and have necessary documentation  Pt did not bring medication to office visit for review  Information was given to pt on Advanced Directives, Living Will  Information was given on Shingles Vaccine  Opportunity was given for questions  Goals that were addressed and/or need to be completed after this appointment include: '  Health Maintenance Due   Topic Date Due   ??? FOBT Q 1 YEAR AGE 68-75  12/22/1998   ??? Pneumococcal 65+ Low/Medium Risk (1 of 2 - PCV13) 12/21/2013   ??? MEDICARE YEARLY EXAM  07/30/2016   ??? GLAUCOMA SCREENING Q2Y  10/27/2016

## 2016-09-02 NOTE — Patient Instructions (Addendum)
Neuropathic Pain: Care Instructions  Your Care Instructions    Neuropathic pain is caused by pressure on or damage to your nerves. It's often simply called nerve pain. Some people feel this type of pain all the time. For others, it comes and goes.  Diabetes, shingles, or an injury can cause nerve pain. Many people say the pain feels sharp, burning, or stabbing. But some people feel it as a dull ache. In some cases, it makes your skin very sensitive. So touch, pressure, and other sensations that did not hurt before may now cause pain.  It's important to know that this kind of pain is real and can affect your quality of life. It's also important to know that treatment can help. Treatment includes pain medicines, exercise, and physical therapy.  Medicines can help reduce the number of pain signals that travel over the nerves. This can make the painful areas less sensitive. It can also help you sleep better and improve your mood. But medicines are only one part of successful treatment.  Most people do best with more than one kind of treatment. Your doctor may recommend that you try cognitive-behavioral therapy and stress management. Or, if needed, you may decide to try to quit smoking, lower your blood pressure, or better control blood sugar. These kinds of healthy changes can also make a difference.  If you feel that your treatment is not working, talk to your doctor. And be sure to tell your doctor if you think you might be depressed or anxious. These are common problems that can also be treated.  Follow-up care is a key part of your treatment and safety. Be sure to make and go to all appointments, and call your doctor if you are having problems. It's also a good idea to know your test results and keep a list of the medicines you take.  How can you care for yourself at home?  ?? Be safe with medicines. Read and follow all instructions on the label.   ?? If the doctor gave you a prescription medicine for pain, take it as prescribed.  ?? If you are not taking a prescription pain medicine, ask your doctor if you can take an over-the-counter medicine.  ?? Save hard tasks for days when you have less pain. Follow a hard task with an easy task. And remember to take breaks.  ?? Relax, and reduce stress. You may want to try deep breathing or meditation. These can help.  ?? Keep moving. Gentle, daily exercise can help reduce pain. Your doctor or physical therapist can tell you what type of exercise is best for you. This may include walking, swimming, and stationary biking. It may also include stretches and range-of-motion exercises.  ?? Try heat, cold packs, and massage.  ?? Get enough sleep. Constant pain can make you more tired. If the pain makes it hard to sleep, talk with your doctor.  ?? Think positively. Your thoughts can affect your pain. Do fun things to distract yourself from the pain. See a movie, read a book, listen to music, or spend time with a friend.  ?? Keep a pain diary. Try to write down how strong your pain is and what it feels like. Also try to notice and write down how your moods, thoughts, sleep, activities, and medicine affect your pain. These notes can help you and your doctor find the best ways to treat your pain.  Reducing constipation caused by pain medicine  Pain medicines often cause constipation. To   reduce constipation:  ?? Include fruits, vegetables, beans, and whole grains in your diet each day. These foods are high in fiber.  ?? Drink plenty of fluids, enough so that your urine is light yellow or clear like water. If you have kidney, heart, or liver disease and have to limit fluids, talk with your doctor before you increase the amount of fluids you drink.  ?? Get some exercise every day. Build up slowly to 30 to 60 minutes a day on 5 or more days of the week.  ?? Take a fiber supplement, such as Citrucel or Metamucil, every day if  needed. Read and follow all instructions on the label.  ?? Schedule time each day for a bowel movement. Having a daily routine may help. Take your time and do not strain when having a bowel movement.  ?? Ask your doctor about a laxative. The goal is to have one easy bowel movement every 1 to 2 days. Do not let constipation go untreated for more than 3 days.  When should you call for help?  Call your doctor now or seek immediate medical care if:  ? ?? You feel sad, anxious, or hopeless for more than a few days. This could mean you are depressed. Depression is common in people who have a lot of pain. But it can be treated.   ? ?? You have trouble with bowel movements, such as:  ?? No bowel movement in 3 days.  ?? Blood in the anal area, in your stool, or on the toilet paper.  ?? Diarrhea for more than 24 hours.   ?Watch closely for changes in your health, and be sure to contact your doctor if:  ? ?? Your pain is getting worse.   ? ?? You can't sleep because of pain.   ? ?? You are very worried or anxious about your pain.   ? ?? You have trouble taking your pain medicine.   ? ?? You have any concerns about your pain medicine or its side effects.   ? ?? You have vomiting or cramps for more than 2 hours.   Where can you learn more?  Go to http://www.healthwise.net/[employer]Help[employer]ctions.  Enter L661 in the search box to learn more about "Neuropathic Pain: Care Instructions."  Current as of: January 10, 2015  Content Version: 11.4  ?? 2006-2017 [employer]. Care instructions adapted under license by [employer] (which disclaims liability or warranty for this information). If you have questions about a medical condition or this instruction, always ask your healthcare professional. [employer]wise[employer] Inc[employer] disclaims any warranty or liability for your use of this information.

## 2016-09-03 ENCOUNTER — Ambulatory Visit

## 2016-09-03 ENCOUNTER — Inpatient Hospital Stay
Admit: 2016-09-03 | Discharge: 2016-09-09 | Disposition: A | Discharge status: Home Health | Payer: MEDICARE | Attending: Family Medicine | Admitting: Family Medicine

## 2016-09-03 ENCOUNTER — Inpatient Hospital Stay: Admit: 2016-09-03 | Payer: MEDICARE | Attending: Family Medicine | Primary: Family Medicine

## 2016-09-03 DIAGNOSIS — M86171 Other acute osteomyelitis, right ankle and foot: Principal | ICD-10-CM

## 2016-09-03 DIAGNOSIS — M21619 Bunion of unspecified foot: Secondary | ICD-10-CM

## 2016-09-03 LAB — CBC WITH AUTOMATED DIFF
ABS. BASOPHILS: 0.1 10*3/uL (ref 0.0–0.1)
ABS. EOSINOPHILS: 0.2 10*3/uL (ref 0.0–0.4)
ABS. IMM. GRANS.: 0 10*3/uL (ref 0.00–0.04)
ABS. LYMPHOCYTES: 1.3 10*3/uL (ref 0.8–3.5)
ABS. MONOCYTES: 0.8 10*3/uL (ref 0.0–1.0)
ABS. NEUTROPHILS: 5.1 10*3/uL (ref 1.8–8.0)
ABSOLUTE NRBC: 0 10*3/uL (ref 0.00–0.01)
BASOPHILS: 1 % (ref 0–1)
EOSINOPHILS: 2 % (ref 0–7)
HCT: 39.8 % (ref 36.6–50.3)
HGB: 13.5 g/dL (ref 12.1–17.0)
IMMATURE GRANULOCYTES: 0 % (ref 0.0–0.5)
LYMPHOCYTES: 17 % (ref 12–49)
MCH: 31.1 PG (ref 26.0–34.0)
MCHC: 33.9 g/dL (ref 30.0–36.5)
MCV: 91.7 FL (ref 80.0–99.0)
MONOCYTES: 11 % (ref 5–13)
MPV: 10.2 FL (ref 8.9–12.9)
NEUTROPHILS: 68 % (ref 32–75)
NRBC: 0 PER 100 WBC
PLATELET: 261 10*3/uL (ref 150–400)
RBC: 4.34 M/uL (ref 4.10–5.70)
RDW: 13.8 % (ref 11.5–14.5)
WBC: 7.4 10*3/uL (ref 4.1–11.1)

## 2016-09-03 LAB — METABOLIC PANEL, BASIC
Anion gap: 6 mmol/L (ref 5–15)
BUN/Creatinine ratio: 18 (ref 12–20)
BUN: 26 MG/DL — ABNORMAL HIGH (ref 6–20)
CO2: 23 mmol/L (ref 21–32)
Calcium: 8.7 MG/DL (ref 8.5–10.1)
Chloride: 110 mmol/L — ABNORMAL HIGH (ref 97–108)
Creatinine: 1.43 MG/DL — ABNORMAL HIGH (ref 0.70–1.30)
GFR est AA: 60 mL/min/{1.73_m2} — ABNORMAL LOW (ref >60)
GFR est non-AA: 49 mL/min/{1.73_m2} — ABNORMAL LOW (ref >60)
Glucose: 99 mg/dL (ref 65–100)
Potassium: 4.3 mmol/L (ref 3.5–5.1)
Sodium: 139 mmol/L (ref 136–145)

## 2016-09-03 LAB — C REACTIVE PROTEIN, QT: C-Reactive protein: 3.13 mg/dL — ABNORMAL HIGH (ref <0.60)

## 2016-09-03 LAB — SED RATE (ESR): Sed rate, automated: 28 mm/hr — ABNORMAL HIGH (ref 0–20)

## 2016-09-03 MED ORDER — PREGABALIN 75 MG CAP
75 mg | Freq: Two times a day (BID) | ORAL | Status: DC
Start: 2016-09-03 — End: 2016-09-09
  Administered 2016-09-04 – 2016-09-09 (×12): via ORAL

## 2016-09-03 MED ORDER — VANCOMYCIN 10 GRAM IV SOLR
10 gram | Freq: Once | INTRAVENOUS | Status: AC
Start: 2016-09-03 — End: 2016-09-03
  Administered 2016-09-04: 02:00:00 via INTRAVENOUS

## 2016-09-03 MED ORDER — AMLODIPINE 5 MG TAB
5 mg | Freq: Every day | ORAL | Status: DC
Start: 2016-09-03 — End: 2016-09-09
  Administered 2016-09-04 – 2016-09-09 (×6): via ORAL

## 2016-09-03 MED ORDER — SODIUM CHLORIDE 0.9 % IJ SYRG
Freq: Three times a day (TID) | INTRAMUSCULAR | Status: DC
Start: 2016-09-03 — End: 2016-09-09
  Administered 2016-09-03 – 2016-09-09 (×19): via INTRAVENOUS

## 2016-09-03 MED ORDER — LISINOPRIL 20 MG TAB
20 mg | Freq: Every day | ORAL | Status: DC
Start: 2016-09-03 — End: 2016-09-09
  Administered 2016-09-04 – 2016-09-09 (×6): via ORAL

## 2016-09-03 MED ORDER — NEBIVOLOL 5 MG TAB
5 mg | Freq: Every evening | ORAL | Status: DC
Start: 2016-09-03 — End: 2016-09-09
  Administered 2016-09-04 – 2016-09-09 (×6): via ORAL

## 2016-09-03 MED ORDER — SODIUM CHLORIDE 0.9 % IJ SYRG
INTRAMUSCULAR | Status: DC | PRN
Start: 2016-09-03 — End: 2016-09-06

## 2016-09-03 MED ORDER — SODIUM CHLORIDE 0.9 % IV
INTRAVENOUS | Status: DC
Start: 2016-09-03 — End: 2016-09-03
  Administered 2016-09-03: 21:00:00 via INTRAVENOUS

## 2016-09-03 MED ORDER — SODIUM CHLORIDE 0.9 % IV
10 gram | Freq: Two times a day (BID) | INTRAVENOUS | Status: DC
Start: 2016-09-03 — End: 2016-09-09
  Administered 2016-09-05 – 2016-09-09 (×10): via INTRAVENOUS

## 2016-09-03 MED ORDER — CEFEPIME 2 GRAM SOLUTION FOR INJECTION
2 gram | Freq: Three times a day (TID) | INTRAMUSCULAR | Status: DC
Start: 2016-09-03 — End: 2016-09-07
  Administered 2016-09-03 – 2016-09-07 (×12): via INTRAVENOUS

## 2016-09-03 MED ORDER — DULOXETINE 30 MG CAP, DELAYED RELEASE
30 mg | Freq: Every day | ORAL | Status: DC
Start: 2016-09-03 — End: 2016-09-09
  Administered 2016-09-04 – 2016-09-09 (×6): via ORAL

## 2016-09-03 MED ORDER — ATORVASTATIN 20 MG TAB
20 mg | Freq: Every evening | ORAL | Status: DC
Start: 2016-09-03 — End: 2016-09-09
  Administered 2016-09-04 – 2016-09-09 (×6): via ORAL

## 2016-09-03 MED ORDER — HEPARIN (PORCINE) 5,000 UNIT/ML IJ SOLN
5000 unit/mL | Freq: Three times a day (TID) | INTRAMUSCULAR | Status: DC
Start: 2016-09-03 — End: 2016-09-03

## 2016-09-03 MED ORDER — HEPARIN (PORCINE) 5,000 UNIT/ML IJ SOLN
5000 unit/mL | Freq: Three times a day (TID) | INTRAMUSCULAR | Status: AC
Start: 2016-09-03 — End: 2016-09-04

## 2016-09-03 MED ORDER — LIDOCAINE 2 % MUCOSAL GEL
2 % | Status: AC
Start: 2016-09-03 — End: 2016-09-03
  Administered 2016-09-03: 22:00:00 via TOPICAL

## 2016-09-03 MED FILL — LIDOCAINE 2 % MUCOSAL GEL: 2 % | Qty: 5

## 2016-09-03 MED FILL — BYSTOLIC 5 MG TABLET: 5 mg | ORAL | Qty: 2

## 2016-09-03 MED FILL — SODIUM CHLORIDE 0.9 % IV: INTRAVENOUS | Qty: 1000

## 2016-09-03 MED FILL — BD POSIFLUSH NORMAL SALINE 0.9 % INJECTION SYRINGE: INTRAMUSCULAR | Qty: 10

## 2016-09-03 MED FILL — VANCOMYCIN 10 GRAM IV SOLR: 10 gram | INTRAVENOUS | Qty: 2500

## 2016-09-03 MED FILL — CEFEPIME 2 GRAM SOLUTION FOR INJECTION: 2 gram | INTRAMUSCULAR | Qty: 2

## 2016-09-03 NOTE — Progress Notes (Signed)
Right LE venous duplex completed. Final results to follow.

## 2016-09-03 NOTE — ED Notes (Signed)
Duplex at bedside at this time

## 2016-09-03 NOTE — H&P (Addendum)
[place] [address]   Office [phone]  Fax [phone]       Admission H&P     Name: Elijah [name] MRN: [id number]  Sex: Male   Date of Birth: 07-Jan-1951  Age: 66 y.o.  PCP: [name], MD     Source of Information: patient, medical records    Chief complaint: R foot ulcer    History of Present Illness  Elijah [name] is a 66 y.o. male with hx of CAD, HTN, polyneuropathy, and morbid obesity who presents to the ER sent by his PCP after MRI study showed osteomyelitis. Pt has had RLE edema and sores of R foot for the past 5-6wks. This started when pt had a cut in the foot from stepping on plastic light fixture. Went to PCP a week later (07/28/16) who evaluated him and was given Augmentin for R leg cellulitis with a referral to a podiatrist for foot care and evaluation. On return visit from podiatry to PCP (5/17), pt was told by the podiatrist that he was not a surgical candidate given his neuropathy and his foot ulcer was debrided and was told to apply betadyne dail with dressing changes. Pt was prescribed 10days of doxycycline, which he completed. Yesterday (6/7), pt had another follow up with worsening findings and had MRI ordered with the positive results today. Pt endorses chronic numbness from his polyneuropathy. Pt denies fever/chills, congestion, cough, cp, sob, abdominal pain.     In the ER, vital signs were remarkable for HTN (156/92). Labs were remarkable for Cr 1.43 (baseline 1.0), ESR 28, CRP 3.13 with nml CBC. Doppler was negative at the ED. 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 Great toe oA, third metatarsal non-acute osteonecrosis. Pt was not started on abx for collection of BCx and WCx.    Past Medical History:   Diagnosis Date   ??? BPH (benign prostatic hyperplasia)    ??? CAD (coronary artery disease)    ??? HLD (hyperlipidemia)     ??? HTN (hypertension)    ??? Morbid obesity due to excess calories ([place]) 04/24/2015   ??? OSA (obstructive sleep apnea)    ??? Polyneuropathy    ??? Tubular adenoma of colon     x2 on colonoscopy 09/18/15      Patient Vitals for the past 12 hrs:   Temp Pulse Resp BP SpO2   09/03/16 1315 98.5 ??F (36.9 ??C) 65 17 (!) 156/92 100 %       Home Medications   Prior to Admission medications    Medication Sig Start Date End Date Taking? Authorizing Provider   lisinopril (PRINIVIL, ZESTRIL) 20 mg tablet TAKE ONE TABLET BY MOUTH ONCE DAILY 08/24/16   [name], MD   doxycycline (ADOXA) 100 mg tablet Take 1 Tab by mouth two (2) times a day for 10 days. 08/24/16 09/03/16  Redge Gainer, MD   cyclobenzaprine (FLEXERIL) 5 mg tablet Take 5 mg by mouth. 07/29/16   Historical Provider   cpap machine kit by Does Not Apply route.    Historical Provider   furosemide (LASIX) 20 mg tablet Take 1 Tab by mouth daily. 07/27/16   Mayer Camel, MD   atorvastatin (LIPITOR) 40 mg tablet TAKE 1 TABLET BY MOUTH EVERY DAY 06/25/16   [name], MD   coenzyme q10 10 mg cap Take 1 Tab  by mouth.    Historical Provider   DULoxetine (CYMBALTA) 60 mg capsule Take 1 Cap by mouth nightly. 06/11/16   Shelbie Hutching, MD   DULoxetine (CYMBALTA) 30 mg capsule Take 1 Cap by mouth nightly. 05/28/16   Shelbie Hutching, MD   amLODIPine (NORVASC) 5 mg tablet TAKE ONE TABLET BY MOUTH ONCE DAILY FOR HYPERTENSION 05/27/16   [name], MD   pregabalin (LYRICA) 300 mg capsule Take 1 Cap by mouth two (2) times a day. Max Daily Amount: 600 mg. Indications: NEUROPATHIC PAIN ASSOCIATED WITH SPINAL CORD INJURY, peripheral neuropathy 05/25/16   [name], MD   triamcinolone acetonide (KENALOG) 0.1 % topical cream Apply  to affected area two (2) times a day. use thin layer 04/07/16   [name], MD   nebivolol (BYSTOLIC) 10 mg tablet Take 1 Tab by mouth daily. 06/24/15   [name], MD   aspirin (ASPIRIN) 325 mg tablet Take 1 Tab by mouth daily. 06/24/15    [name], MD   Flaxseed Oil oil 1 Tab by Does Not Apply route two (2) times a day. 06/24/15   [name], MD   [facility] ACID/EPA (FISH OIL PO) Take 2 Caps by mouth two (2) times a day.    Historical Provider       Allergies  Allergies   Allergen Reactions   ??? Metoprolol Other (comments)     Patient states, made me feel horrible.       Past Medical History:   Diagnosis Date   ??? BPH (benign prostatic hyperplasia)    ??? CAD (coronary artery disease)    ??? HLD (hyperlipidemia)    ??? HTN (hypertension)    ??? Morbid obesity due to excess calories ([place]) 04/24/2015   ??? OSA (obstructive sleep apnea)    ??? Polyneuropathy    ??? Tubular adenoma of colon     x2 on colonoscopy 09/18/15       Past Surgical History:   Procedure Laterality Date   ??? HX HIP REPLACEMENT Bilateral        Family History   Problem Relation Age of Onset   ??? Stroke Mother    ??? Other Father      mrsa infxn caused death   ??? Heart Attack Brother        Social History  Social History     Social History   ??? Marital status: MARRIED     Spouse name: N/A   ??? Number of children: N/A   ??? Years of education: N/A     Occupational History   ??? Not on file.     Social History Main Topics   ??? Smoking status: Former Smoker     Types: Cigarettes   ??? Smokeless tobacco: Never Used   ??? Alcohol use No      Comment: recently has stopped   ??? Drug use: No   ??? Sexual activity: Not on file     Other Topics Concern   ??? Not on file     Social History Narrative       Alcohol history: 1-2 beer/day  Smoking history: Former smoker, quit 36UYQ ago  Illicit drug history: Not at all    Living arrangement: patient lives with their spouse.  Ambulates: Independently     Review of Systems: (bolded are positive):   General Negative for fever, chills, changes in weight, changes in appetite   HEENT Negative for hearing loss, earache,  congestion, sore throat   CV Negative for chest pain, palpitations, edema   Respiratory Negative for cough, shortness of breath, wheezing    GI Negative for change in bowel habits, abdominal pain, black or bloody stools, nausea or vomiting   GU Negative for frequency, dysuria, hematuria, vaginal discharge   MSK Negative for back pain, joint pain, muscle pain   Breast Negative for breast lumps, nipple discharge, galactorrhea   Skin Negative for itching, rash, hives, wound on R foot    Neuro Negative for dizziness, headache, confusion, weakness, chronic decreased sensation, numbness   Psych Negative for anxiety, depression, change in mood   Heme/lymph Negative for bleeding, bruising, pallor     Physical Exam  Objective   Visit Vitals   ??? BP (!) 156/92 (BP 1 Location: Right arm, BP Patient Position: At rest)   ??? Pulse 65   ??? Temp 98.5 ??F (36.9 ??C)   ??? Resp 17   ??? Ht '6\' 2"'  (1.88 m)   ??? Wt 345 lb (156.5 kg)   ??? SpO2 100%   ??? BMI 44.3 kg/m2         O2 Device: Room air     General:   Alert, cooperative, no acute distress   Head:   Atraumatic   Eyes:   Conjunctivae clear   ENT:  Oral mucosa normal   Neck:  Supple, trachea midline, no adenopathy   No JVD   Back:    No CVA tenderness    Lungs:   Clear to auscultation bilaterally    Heart:   Regular rate and rhythm, no murmur   Abdomen:    Soft, non-tender, ND, No masses or organomegaly    Extremities:  No edema or DVT signs   Skin:  Warm and dry. Refer to foot picture below   Neurologic:  Oriented. Decreased sensation below half mid calf all the way to toes. No other focal deficits       Urinary catheter:  none                       Laboratory Data  Recent Results (from the past 8 hour(s))   CBC WITH AUTOMATED DIFF    Collection Time: 09/03/16  2:07 PM   Result Value Ref Range    WBC 7.4 4.1 - 11.1 K/uL    RBC 4.34 4.10 - 5.70 M/uL    HGB 13.5 12.1 - 17.0 g/dL    HCT 39.8 36.6 - 50.3 %    MCV 91.7 80.0 - 99.0 FL    MCH 31.1 26.0 - 34.0 PG    MCHC 33.9 30.0 - 36.5 g/dL    RDW 13.8 11.5 - 14.5 %    PLATELET 261 150 - 400 K/uL    MPV 10.2 8.9 - 12.9 FL    NRBC 0.0 0 PER 100 WBC     ABSOLUTE NRBC 0.00 0.00 - 0.01 K/uL    NEUTROPHILS 68 32 - 75 %    LYMPHOCYTES 17 12 - 49 %    MONOCYTES 11 5 - 13 %    EOSINOPHILS 2 0 - 7 %    BASOPHILS 1 0 - 1 %    IMMATURE GRANULOCYTES 0 0.0 - 0.5 %    ABS. NEUTROPHILS 5.1 1.8 - 8.0 K/UL    ABS. LYMPHOCYTES 1.3 0.8 - 3.5 K/UL    ABS. MONOCYTES 0.8 0.0 - 1.0 K/UL    ABS. EOSINOPHILS 0.2 0.0 - 0.4 K/UL  ABS. BASOPHILS 0.1 0.0 - 0.1 K/UL    ABS. IMM. GRANS. 0.0 0.00 - 0.04 K/UL    DF AUTOMATED     METABOLIC PANEL, BASIC    Collection Time: 09/03/16  2:07 PM   Result Value Ref Range    Sodium 139 136 - 145 mmol/L    Potassium 4.3 3.5 - 5.1 mmol/L    Chloride 110 (H) 97 - 108 mmol/L    CO2 23 21 - 32 mmol/L    Anion gap 6 5 - 15 mmol/L    Glucose 99 65 - 100 mg/dL    BUN 26 (H) 6 - 20 MG/DL    Creatinine 1.43 (H) 0.70 - 1.30 MG/DL    BUN/Creatinine ratio 18 12 - 20      GFR est AA 60 (L) >60 ml/min/1.59m    GFR est non-AA 49 (L) >60 ml/min/1.7468m   Calcium 8.7 8.5 - 10.1 MG/DL   SED RATE (ESR)    Collection Time: 09/03/16  2:07 PM   Result Value Ref Range    Sed rate, automated 28 (H) 0 - 20 mm/hr   C REACTIVE PROTEIN, QT    Collection Time: 09/03/16  2:07 PM   Result Value Ref Range    C-Reactive protein 3.13 (H) <0.60 mg/dL       Imaging  CXR Results  (Last 48 hours)    None        CT Results  (Last 48 hours)    None        EKG: not indicated      Assessment and Plan     HoWILMORE HOLSOMBACKs a [age].o. male who is admitted for osteomyelitis.    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 S/p 10 day augmentin and doxycycline at separate occasions respectively. MRI positive. Negative LE doppler.   - Admit to medical floor, regular diet, NPO after midnight  - Vanc and cefepime per podiatry  - F/u podiatry consult, appreciate recs  - Evalute LE perfusion with ABI and LE PVR  - F/u BCx and Wound Cx    HTN. poa 156/92  - Continue home Norvasc 68m568mlisinopril 63m32KGystolic 2m25KY   Chronic peripheral neuropathy, familial   - Continue Cymbalta and Lyrica 300m39mD    HLD. TC 162, LDL 58, HDL 40, TG 322 on 06/22/16.  - Continue Lipitor 40mg38m Hold aspirin for now    Obesity BMI Body mass index is 44.3 kg/(m^2).  - Encouraging lifestyle modifications and further follow up outpatient.     FEN/GI - Regular diet. NPO after midnight  Activity - Ambulate as tolerated  DVT prophylaxis - Lovenox for tonight. Hold in the morning.  GI prophylaxis - Not indicated at this time  Fall prophylaxis - Not indicated at this time.  Disposition - Admit to Medical. Plan to d/c to Home. Consulting PT/OT  Code Status - DNR, discussed with patient / caregivers.  Next of Kin Name and Contact     Patient Elijah [name] be discussed Dr. MatthVashti Hey3:49 PM, 09/03/16  [facility] Resident       For Billing    Chief Complaint   Patient presents with   ??? Toe Pain       Hospital Problems  Date Reviewed: 09/02/2016    None

## 2016-09-03 NOTE — Progress Notes (Signed)
I called the patient to discuss the MRI results.  He had already returned to [place].  I told him it looked like he had an infection of the second metatarsal and that [facility] for stat labs and evaluation for admission.  He understood and will be heading back to [place] [facility].  I do not have the information about possible foreign bodies when I spoke with him.  He has been on doxycycline for the last 10 days.

## 2016-09-03 NOTE — Progress Notes (Signed)
Primary Nurse Christabel Vista LawmanS Forche, RN and Leretha PolEllie Franco, RN performed a dual skin assessment on this patient Impairment noted- see wound doc flow sheet. Skin alterations noted in notewritter

## 2016-09-03 NOTE — ED Notes (Signed)
Attempted to call report, unable to receive at this time

## 2016-09-03 NOTE — ED Triage Notes (Signed)
Pt sent by PCP for infection in the toe.  He had an MRI this AM and was told to come in for admission.

## 2016-09-03 NOTE — Progress Notes (Signed)
[facility]: MED RECONCILIATION    Comments/Recommendations:   ? Patient was able to list the name and frequency of his medications, but was not sure of the strengths.  ? Patient states he is not allergic to metoprolol, it is ineffective for him.  ? Patient states he is not on furosemide (no supply), but he would like to be on it.      Medication(s) ADDED to PTA list:  1. None    Medication(s) REMOVED from PTA list:  1. Flexeril prn  2. Doxycycline 100 mg BID (course completed)  3. Furosemide 20 mg daily  4. Triamcinolone cream BID    Medication(s) ADJUSTED on PTA list:  1. Atorvastatin, Co Q 10, Nebivolol changed to "QHS" administration  2. Duloxetine 60 mg changed to "QAM" administration.    Information obtained from: Fill from [facility], patient      Allergies: Metoprolol    Prior to Admission Medications:     Prior to Admission Medications   Prescriptions Last Dose Informant Patient Reported? Taking?   DOCOSAHEXANOIC ACID/EPA (FISH OIL PO)   Yes No   Sig: Take 1 Cap by mouth daily.   DULoxetine (CYMBALTA) 30 mg capsule 09/02/2016 at Unknown time  No Yes   Sig: Take 1 Cap by mouth nightly.   DULoxetine (CYMBALTA) 60 mg capsule 09/03/2016 at Unknown time  Yes Yes   Sig: Take 60 mg by mouth daily.   Flaxseed Oil oil   Yes Yes   Sig: Take 1 Cap by mouth daily.   amLODIPine (NORVASC) 5 mg tablet 09/03/2016 at Unknown time  No Yes   Sig: TAKE ONE TABLET BY MOUTH ONCE DAILY FOR HYPERTENSION   aspirin (ASPIRIN) 325 mg tablet 09/03/2016 at Unknown time  No Yes   Sig: Take 1 Tab by mouth daily.   atorvastatin (LIPITOR) 40 mg tablet 09/02/2016 at Unknown time  Yes Yes   Sig: Take 40 mg by mouth nightly.   co-enzyme Q-10 (CO Q-10) 100 mg capsule 09/02/2016 at Unknown time  Yes Yes   Sig: Take 100 mg by mouth nightly.   cpap machine kit   Yes No   Sig: by Does Not Apply route.   lisinopril (PRINIVIL, ZESTRIL) 20 mg tablet 09/03/2016 at Unknown time  No Yes   Sig: TAKE ONE TABLET BY MOUTH ONCE DAILY    nebivolol (BYSTOLIC) 10 mg tablet 6/0/7371 at Unknown time  Yes Yes   Sig: Take 10 mg by mouth nightly.   pregabalin (LYRICA) 300 mg capsule 09/03/2016 at am  No Yes   Sig: Take 1 Cap by mouth two (2) times a day. Max Daily Amount: 600 mg. Indications: NEUROPATHIC PAIN ASSOCIATED WITH SPINAL CORD INJURY, peripheral neuropathy      Facility-Administered Medications: None            Loyal Gambler, PHARMD   Contact: [phone]

## 2016-09-03 NOTE — ED Provider Notes (Signed)
HPI Comments: 66 y.o. male with past medical history significant for MO, CAD, HTN and polyneuropathy who presents with chief complaint of skin problem. Pt reports he has had RLE edema and sores on his right foot for 5 weeks. Pt also reports current numbness in his right foot. Pt reports he was prompted to visit the ED for admission by his PCP after he had an MRI today of his right foot that showed a possible bone infection. Pt reports he has taken 10 days of doxycycline for current sx. Pt reports he was seen by an orthopedist 2 weeks ago. Pt also notes since onset of sx 5 weeks ago he has had blood work done to test for a blood clot. Pt denies fever, chills and congestion. There are no other acute medical concerns at this time.    Social hx: no EtOH use, former tobacco smoker  PCP: [name], MD    Old Chart Review: Note from PCP shows that 6 weeks ago pt stepped on a plastic light fixture that cut his foot. Pt believes this is the cause of his current sores.     Note written by Ward Chatters, Scribe, as dictated by Dennison Mascot, MD 1:40 PM      The history is provided by the patient.        Past Medical History:   Diagnosis Date   ??? BPH (benign prostatic hyperplasia)    ??? CAD (coronary artery disease)    ??? HLD (hyperlipidemia)    ??? HTN (hypertension)    ??? Morbid obesity due to excess calories ([place]) 04/24/2015   ??? OSA (obstructive sleep apnea)    ??? Polyneuropathy    ??? Tubular adenoma of colon     x2 on colonoscopy 09/18/15       Past Surgical History:   Procedure Laterality Date   ??? HX HIP REPLACEMENT Bilateral          Family History:   Problem Relation Age of Onset   ??? Stroke Mother    ??? Other Father      mrsa infxn caused death   ??? Heart Attack Brother        Social History     Social History   ??? Marital status: MARRIED     Spouse name: N/A   ??? Number of children: N/A   ??? Years of education: N/A     Occupational History   ??? Not on file.     Social History Main Topics   ??? Smoking status: Former Smoker      Types: Cigarettes   ??? Smokeless tobacco: Never Used   ??? Alcohol use No      Comment: recently has stopped   ??? Drug use: No   ??? Sexual activity: Not on file     Other Topics Concern   ??? Not on file     Social History Narrative         ALLERGIES: Metoprolol    Review of Systems   Constitutional: Negative for chills and fever.   HENT: Negative for congestion.    Respiratory: Negative for cough.    Cardiovascular: Positive for leg swelling (R>L).   Skin: Positive for wound.   Neurological: Positive for numbness (right foot).   All other systems reviewed and are negative.      Vitals:    09/03/16 1315   BP: (!) 156/92   Pulse: 65   Resp: 17   Temp: 98.5 ??F (  36.9 ??C)   SpO2: 100%   Weight: 156.5 kg (345 lb)   Height: '6\' 2"'  (1.88 m)            Physical Exam   Constitutional: He is oriented to person, place, and time. He appears well-developed and well-nourished. No distress.   HENT:   Head: Normocephalic and atraumatic.   Eyes: Conjunctivae are normal.   Neck: Neck supple. No tracheal deviation present.   Cardiovascular: Normal rate and regular rhythm.    Pulmonary/Chest: Effort normal. No respiratory distress.   Abdominal: He exhibits no distension.   Musculoskeletal:   4+ RLE pitting edema to level of knee   Neurological: He is alert and oriented to person, place, and time.   Skin: Skin is warm.   Right Foot: 2 ulcerations through dermis on plantar surface, one over 1st MTP and one over 2nd MTP   Psychiatric: He has a normal mood and affect.   Nursing note and vitals reviewed.     Note written by Ward Chatters, Scribe, as dictated by Dennison Mascot, MD 1:40 PM    MDM   66 y.o. male presents with chronic ulceration of right foot over last 5 weeks. Suspect this is pressure ulceration d/t polyneuropathy but there is question of an initial inciting event. He was sent by PCP for MR and they are recommending admission for management of noted osteomyelitis and  septic arthritis of the foot. IV ABx withheld pending surgical or biopsy sample for direction of ABx therapy with lack of systemic symptoms. Family practice was consulted for admission and will see the patient in the emergency department.    ED Course       Procedures                                   CONSULT NOTE:  3:40 PM Dennison Mascot, MD spoke with Dr. [name] Jackson consult for [facility] Resident.  Discussed available diagnostic tests and clinical findings.  He will see and admit.    Results:    Labs:  Recent Results (from the past 24 hour(s))   CBC WITH AUTOMATED DIFF    Collection Time: 09/03/16  2:07 PM   Result Value Ref Range    WBC 7.4 4.1 - 11.1 K/uL    RBC 4.34 4.10 - 5.70 M/uL    HGB 13.5 12.1 - 17.0 g/dL    HCT 39.8 36.6 - 50.3 %    MCV 91.7 80.0 - 99.0 FL    MCH 31.1 26.0 - 34.0 PG    MCHC 33.9 30.0 - 36.5 g/dL    RDW 13.8 11.5 - 14.5 %    PLATELET 261 150 - 400 K/uL    MPV 10.2 8.9 - 12.9 FL    NRBC 0.0 0 PER 100 WBC    ABSOLUTE NRBC 0.00 0.00 - 0.01 K/uL    NEUTROPHILS 68 32 - 75 %    LYMPHOCYTES 17 12 - 49 %    MONOCYTES 11 5 - 13 %    EOSINOPHILS 2 0 - 7 %    BASOPHILS 1 0 - 1 %    IMMATURE GRANULOCYTES 0 0.0 - 0.5 %    ABS. NEUTROPHILS 5.1 1.8 - 8.0 K/UL    ABS. LYMPHOCYTES 1.3 0.8 - 3.5 K/UL    ABS. MONOCYTES 0.8 0.0 - 1.0 K/UL    ABS. EOSINOPHILS 0.2 0.0 - 0.4  K/UL    ABS. BASOPHILS 0.1 0.0 - 0.1 K/UL    ABS. IMM. GRANS. 0.0 0.00 - 0.04 K/UL    DF AUTOMATED     METABOLIC PANEL, BASIC    Collection Time: 09/03/16  2:07 PM   Result Value Ref Range    Sodium 139 136 - 145 mmol/L    Potassium 4.3 3.5 - 5.1 mmol/L    Chloride 110 (H) 97 - 108 mmol/L    CO2 23 21 - 32 mmol/L    Anion gap 6 5 - 15 mmol/L    Glucose 99 65 - 100 mg/dL    BUN 26 (H) 6 - 20 MG/DL    Creatinine 1.43 (H) 0.70 - 1.30 MG/DL    BUN/Creatinine ratio 18 12 - 20      GFR est AA 60 (L) >60 ml/min/1.69m    GFR est non-AA 49 (L) >60 ml/min/1.737m   Calcium 8.7 8.5 - 10.1 MG/DL   SED RATE (ESR)     Collection Time: 09/03/16  2:07 PM   Result Value Ref Range    Sed rate, automated 28 (H) 0 - 20 mm/hr   C REACTIVE PROTEIN, QT    Collection Time: 09/03/16  2:07 PM   Result Value Ref Range    C-Reactive protein 3.13 (H) <0.60 mg/dL       Imaging:  Mri Foot Rt Wo Cont    Addendum Date: 09/03/2016    Addendum: I discussed the results with Dr. SpDonalda Ewingst 9:30 AM on 09/03/2016. After reviewing the images again, there may be tiny foreign bodies in the periosteal fluid of the second toe at the distal aspect of the second proximal phalanx. Otherwise, no change in findings or impression.    Result Date: 09/03/2016  EXAM:  MRI FOOT RT WO CONT INDICATION:  Right foot pain, swelling, ulcerations. Morbid obesity. No mention of diabetes. Right foot views on 08/04/2016. COMPARISON: None TECHNIQUE: Axial, coronal, and sagittal MRI of the right forefoot in the T1 and T2 pulse sequences with and without fat saturation . CONTRAST: None. FINDINGS: Motion artifact obscures fine detail and limits sensitivity for detecting pathology. Bone marrow: Heterogeneous mild bone marrow edema is in the first, second, and third metatarsal heads. Subtle bone marrow edema in the second phalanges. Periosteal fluid surrounds the second proximal phalanx. Dorsal subluxation of the second MTP joint. Degenerative bone marrow edema is predominantly plantar in the first metatarsal head. No acute fracture. Mild flattening of the subchondral bone of the third metatarsal head. Underlying bone marrow edema and sclerosis. Joint fluid:  Second MTP and second PIP joint effusions. Small reactive joint effusions in the first MTP and first IP joints. Tendons: Small volume of fluid surrounds the second flexor digitorum tendons. No full-thickness tendon tear. Muscles: Moderate atrophy. Diffuse edema. Neurovascular bundles: No evidence of neuroma. 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 Possible septic arthritis of the second MTP joint. Normal Lisfranc joint. Soft tissue mass: Dorsal soft tissue edema. Skin breakdown plantar and medial to the MTS joint. Skin breakdown plantar to the second MTP joint. No drainable fluid collection. No measurable mass.     IMPRESSION: 1. Second MTP joint dorsal subluxation and evidence of septic arthritis with likely osteomyelitis of the second metatarsal head and second proximal phalanx. If there is no clinical signs of infection, findings could represent an inflammatory arthritis. Consider fluid sampling from the second MTP joint or second proximal phalanx periosteum. 2. Great toe osteoarthritis and  reactive edema. Osteomyelitis is less likely in the first ray. 3. Third metatarsal head nonacute osteonecrosis is more likely than osteomyelitis. 4. Skin ulcerations. Cellulitis and myositis. No evidence of drainable abscess within the limitation of motion artifact and no intravenous contrast. EPIC email sent to Dr. Donalda Ewings at the time of the dictation.

## 2016-09-03 NOTE — Progress Notes (Signed)
[facility] Dosing Services: Antimicrobial Stewardship Daily Doc    Consult for antibiotic dosing of vancomycin by Dr. Tonia GhentAgeze  Indication: osteomyelitis   Day of Therapy 1    Vancomycin therapy:  Current maintenance dose: 2000 (mg) every 12 hours   Dose calculated to approximate a therapeutic trough of 15-8520mcg/mL.    Note: AKI, please watch scr carefully     Plan for level / Adjustment in Therapy:prior to the 4th dose   Other Antimicrobial   (not dosed by pharmacist) Cefepime 2g IV q 8 hours   Cultures none   Serum Creatinine Lab Results   Component Value Date/Time    Creatinine 1.43 (H) 09/03/2016 02:07 PM         Creatinine Clearance Estimated Creatinine Clearance: 79.3 mL/min (based on Cr of 1.43).     Temp Temp: 98.5 ??F (36.9 ??C)       WBC Lab Results   Component Value Date/Time    WBC 7.4 09/03/2016 02:07 PM        H/H Lab Results   Component Value Date/Time    HGB 13.5 09/03/2016 02:07 PM        Platelets    Lab Results   Component Value Date/Time    PLATELET 261 09/03/2016 02:07 PM            Pharmacist Prescott ParmaAnn Izaya Netherton, PHARMD Contact information: [phone]

## 2016-09-03 NOTE — Other (Signed)
TRANSFER - OUT REPORT:    Verbal report given to Amy on Elijah [name]  being transferred to 435 for routine progression of care       Report consisted of patient???s Situation, Background, Assessment and   Recommendations(SBAR).     Information from the following report(s) SBAR, ED Summary, [facility] and Recent Results was reviewed with the receiving nurse.    Opportunity for questions and clarification was provided.

## 2016-09-04 LAB — CBC WITH AUTOMATED DIFF
ABS. BASOPHILS: 0.1 10*3/uL (ref 0.0–0.1)
ABS. EOSINOPHILS: 0.3 10*3/uL (ref 0.0–0.4)
ABS. IMM. GRANS.: 0 10*3/uL (ref 0.00–0.04)
ABS. LYMPHOCYTES: 1.5 10*3/uL (ref 0.8–3.5)
ABS. MONOCYTES: 1 10*3/uL (ref 0.0–1.0)
ABS. NEUTROPHILS: 3.7 10*3/uL (ref 1.8–8.0)
ABSOLUTE NRBC: 0 10*3/uL (ref 0.00–0.01)
BASOPHILS: 1 % (ref 0–1)
EOSINOPHILS: 4 % (ref 0–7)
HCT: 39.1 % (ref 36.6–50.3)
HGB: 13.1 g/dL (ref 12.1–17.0)
IMMATURE GRANULOCYTES: 1 % — ABNORMAL HIGH (ref 0.0–0.5)
LYMPHOCYTES: 22 % (ref 12–49)
MCH: 30.9 PG (ref 26.0–34.0)
MCHC: 33.5 g/dL (ref 30.0–36.5)
MCV: 92.2 FL (ref 80.0–99.0)
MONOCYTES: 15 % — ABNORMAL HIGH (ref 5–13)
MPV: 10.1 FL (ref 8.9–12.9)
NEUTROPHILS: 57 % (ref 32–75)
NRBC: 0 PER 100 WBC
PLATELET: 238 10*3/uL (ref 150–400)
RBC: 4.24 M/uL (ref 4.10–5.70)
RDW: 14.1 % (ref 11.5–14.5)
WBC: 6.5 10*3/uL (ref 4.1–11.1)

## 2016-09-04 LAB — METABOLIC PANEL, COMPREHENSIVE
A-G Ratio: 0.8 — ABNORMAL LOW (ref 1.1–2.2)
ALT (SGPT): 21 U/L (ref 12–78)
AST (SGOT): 18 U/L (ref 15–37)
Albumin: 2.8 g/dL — ABNORMAL LOW (ref 3.5–5.0)
Alk. phosphatase: 62 U/L (ref 45–117)
Anion gap: 10 mmol/L (ref 5–15)
BUN/Creatinine ratio: 20 (ref 12–20)
BUN: 25 MG/DL — ABNORMAL HIGH (ref 6–20)
Bilirubin, total: 0.4 MG/DL (ref 0.2–1.0)
CO2: 21 mmol/L (ref 21–32)
Calcium: 8.4 MG/DL — ABNORMAL LOW (ref 8.5–10.1)
Chloride: 111 mmol/L — ABNORMAL HIGH (ref 97–108)
Creatinine: 1.24 MG/DL (ref 0.70–1.30)
GFR est AA: >60 mL/min/{1.73_m2} (ref >60)
GFR est non-AA: 58 mL/min/{1.73_m2} — ABNORMAL LOW (ref >60)
Globulin: 3.7 g/dL (ref 2.0–4.0)
Glucose: 88 mg/dL (ref 65–100)
Potassium: 3.9 mmol/L (ref 3.5–5.1)
Protein, total: 6.5 g/dL (ref 6.4–8.2)
Sodium: 142 mmol/L (ref 136–145)

## 2016-09-04 LAB — C REACTIVE PROTEIN, QT: C-Reactive protein: 3.71 mg/dL — ABNORMAL HIGH (ref <0.60)

## 2016-09-04 MED FILL — AMLODIPINE 5 MG TAB: 5 mg | ORAL | Qty: 1

## 2016-09-04 MED FILL — DULOXETINE 30 MG CAP, DELAYED RELEASE: 30 mg | ORAL | Qty: 2

## 2016-09-04 MED FILL — HEPARIN (PORCINE) 5,000 UNIT/ML IJ SOLN: 5000 unit/mL | INTRAMUSCULAR | Qty: 1

## 2016-09-04 MED FILL — BYSTOLIC 5 MG TABLET: 5 mg | ORAL | Qty: 2

## 2016-09-04 MED FILL — CEFEPIME 2 GRAM SOLUTION FOR INJECTION: 2 gram | INTRAMUSCULAR | Qty: 2

## 2016-09-04 MED FILL — VANCOMYCIN 10 GRAM IV SOLR: 10 gram | INTRAVENOUS | Qty: 2000

## 2016-09-04 MED FILL — LISINOPRIL 20 MG TAB: 20 mg | ORAL | Qty: 1

## 2016-09-04 MED FILL — LYRICA 75 MG CAPSULE: 75 mg | ORAL | Qty: 4

## 2016-09-04 MED FILL — ATORVASTATIN 20 MG TAB: 20 mg | ORAL | Qty: 2

## 2016-09-04 NOTE — Progress Notes (Signed)
ABI completed. Final results to follow.

## 2016-09-04 NOTE — Progress Notes (Signed)
physical Therapy EVALUATION/DISCHARGE  Patient: Elijah [name] (66 y.o. 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 PTA patient was independent with all functional mobility without use of an AD. Patient lives with his wife in one of two homes, typically a farmhouse nearby with 2 levels. Currently, patient is independent with all aspects of functional mobility. Patient ambulated 300' with widened stance and slight antalgia on R and shift to L side but no LOB or safety concerns.  Prior to PT arriving, patient took shower in bathroom with no concerns, per PCT and RN.  Patient left sitting EOB at the conclusion of PT evaluation. Patient presents at baseline level of function at this time and skilled PT services are not indicated at this time.  However, it is still not determined whether he will require further surgery for his R foot that may prompt a new order for PT to assess prior to d/c.  However, at this time will complete order.   .    Skilled physical therapy is not indicated at this time.     PLAN :  Discharge Recommendations: None  Further Equipment Recommendations for Discharge: None     SUBJECTIVE:   Patient stated ???I was moving just fine, I just got here last night I haven't lost the ability to walk yet.???    OBJECTIVE DATA SUMMARY:   HISTORY:    Past Medical History:   Diagnosis Date   ??? BPH (benign prostatic hyperplasia)    ??? CAD (coronary artery disease)    ??? HLD (hyperlipidemia)    ??? HTN (hypertension)    ??? Morbid obesity due to excess calories (HCC) 04/24/2015   ??? OSA (obstructive sleep apnea)    ??? Polyneuropathy     ??? Tubular adenoma of colon     x2 on colonoscopy 09/18/15     Past Surgical History:   Procedure Laterality Date   ??? HX HIP REPLACEMENT Bilateral      Prior Level of Function/Home Situation: Lives alone part of the time with wife away but very indep with no AD needed, [place]  Personal factors and/or comorbidities impacting plan of care:     Home Situation  Home Environment: Private residence  # Steps to Enter: 3  Rails to Enter: Yes  One/Two Story Residence: Two Nutritional therapist of Interior Steps: 12  Living Alone: No  Support Systems: Games developer  Patient Expects to be Discharged to:: Private residence  Current DME Used/Available at Home: None    EXAMINATION/PRESENTATION/DECISION MAKING:   Critical Behavior:  Neurologic State: Alert  Orientation Level: Oriented X4  Cognition: Appropriate decision making, Follows commands     Hearing:     Skin:  See nursing notes  Edema: R foot  Range Of Motion:  AROM: Within functional limits           PROM: Within functional limits           Strength:    Strength: Within functional limits                    Tone & Sensation:  Tone: Normal              Sensation: Intact               Coordination:  Coordination: Within functional limits  Vision:      Functional Mobility:  Bed Mobility:  Rolling: Independent  Supine to Sit: Independent  Sit to Supine: Independent  Scooting: Independent  Transfers:  Sit to Stand: Independent  Stand to Sit: Independent                       Balance:   Sitting: Intact;Without support  Standing: Intact;Without support  Ambulation/Gait Training:  Distance (ft): 300 Feet (ft)  Assistive Device: Gait belt  Ambulation - Level of Assistance: Independent        Gait Abnormalities: Antalgic        Base of Support: Widened;Shift to left        Step Length: Right shortened;Left shortened                            Functional Measure:  Tinetti test:    Sitting Balance: 1  Arises: 2  Attempts to Rise: 2  Immediate Standing Balance: 2   Standing Balance: 1  Nudged: 2  Eyes Closed: 1  Turn 360 Degrees - Continuous/Discontinuous: 1  Turn 360 Degrees - Steady/Unsteady: 1  Sitting Down: 2  Balance Score: 15  Indication of Gait: 1  R Step Length/Height: 1  L Step Length/Height: 1  R Foot Clearance: 1  L Foot Clearance: 1  Step Symmetry: 1  Step Continuity: 1  Path: 2  Trunk: 2  Walking Time: 0  Gait Score: 11  Total Score: 26       Tinetti Test and G-code impairment scale:  Percentage of Impairment CH    0%   CI    1-19% CJ    20-39% CK    40-59% CL    60-79% CM    80-99% CN     100%   Tinetti  Score 0-28 28 23-27 17-22 12-16 6-11 1-5 0       Tinetti Tool Score Risk of Falls  <19 = High Fall Risk  19-24 = Moderate Fall Risk  25-28 = Low Fall Risk  Tinetti ME. Performance-Oriented Assessment of Mobility Problems in Elderly Patients. JAGS 1986; [id number]:119-126. (Scoring Description: PT Bulletin Feb. 10, 1993)    Older adults: Lonn [place](Ko et al, 2009; n = 1000 [place] elderly evaluated with ABC, POMA, ADL, and IADL)  ?? Mean POMA score for males aged 65-79 years = 26.21(3.40)  ?? Mean POMA score for females age 68-79 years = 25.16(4.30)  ?? Mean POMA score for males over 80 years = 23.29(6.02)  ?? Mean POMA score for females over 80 years = 17.20(8.32)           G codes:  In compliance with CMS???s Claims Based Outcome Reporting, the following G-code set was chosen for this patient based on their primary functional limitation being treated:    The outcome measure chosen to determine the severity of the functional limitation was the Tinetti with a score of 26/28 which was correlated with the impairment scale.    ? Mobility - Walking and Moving Around:    [id number] - CURRENT STATUS: CI - 1%-19% impaired, limited or restricted   [id number] - GOAL STATUS: CI - 1%-19% impaired, limited or restricted   [id number] - D/C STATUS:  CI - 1%-19% impaired, limited or restricted        Physical Therapy Evaluation Charge Determination   History Examination Presentation Decision-Making    MEDIUM  Complexity : 1-2 comorbidities / personal factors will impact the outcome/ POC  LOW Complexity : 1-2 Standardized tests and measures addressing body structure, function, activity limitation and / or participation in recreation  MEDIUM Complexity : Evolving with changing characteristics  Other outcome measures Tinetti 26/28  LOW       Based on the above components, the patient evaluation is determined to be of the following complexity level: LOW     Pain:  Pain Scale 1: Numeric (0 - 10)  Pain Intensity 1: 0     Activity Tolerance:   Good - 300' ambulation with no c/o fatigue    Please refer to the flowsheet for vital signs taken during this treatment.  After treatment:   []    Patient left in no apparent distress sitting up in chair  [x]    Patient left in no apparent distress in bed  [x]    Call bell left within reach  [x]    Nursing notified  []    Caregiver present  []    Bed alarm activated    COMMUNICATION/EDUCATION:   Communication/Collaboration:  [x]    Fall prevention education was provided and the patient/caregiver indicated understanding.  [x]    Patient/family have participated as able and agree with findings and recommendations.  []    Patient is unable to participate in plan of care at this time.  Findings and recommendations were discussed with: Registered Nurse    Thank you for this referral.  Kelvin Cellar, PT   Time Calculation: 18 mins

## 2016-09-04 NOTE — Progress Notes (Signed)
Occupational Therapy EVALUATION/discharge  Patient: Elijah [name] (66 y.o. male)  Date: 09/04/2016  Primary Diagnosis: Osteomyelitis (HCC)  CELLULITIS  Procedure(s) (LRB):  INCISION AND DRAINAGERIGHT FOOT, FOREIGN BODY REMOVAL, 2ND METATARSAL BONE BIOPSY (Right)     Precautions: universal       ASSESSMENT:   Based on the objective data described below, the patient presents with good overall activity tolerance following admission on 6/8 for osteomyelitis of R 2nd metatarsal.  Patient is expected to undergo R foot I&D tomorrow.  PTA, patient lives with his wife and managed all care independently. Today, patient was independent with all ADLs and functional mobility.  Patient encouraged to be OOB to chair, with R LE elevation (when seated or supine), as often as tolerated and instruction provided for exercises to complete at minimum 3x/day.  Patient has no further skilled OT needs and is cleared from OT services at this time.      Further skilled acute occupational therapy is not indicated at this time.  Discharge Recommendations: None  Further Equipment Recommendations for Discharge: none      SUBJECTIVE:   Patient was agreeable to OT evaluation.    OBJECTIVE DATA SUMMARY:   HISTORY:   Past Medical History:   Diagnosis Date   ??? BPH (benign prostatic hyperplasia)    ??? CAD (coronary artery disease)    ??? HLD (hyperlipidemia)    ??? HTN (hypertension)    ??? Morbid obesity due to excess calories (HCC) 04/24/2015   ??? OSA (obstructive sleep apnea)    ??? Polyneuropathy    ??? Tubular adenoma of colon     x2 on colonoscopy 09/18/15     Past Surgical History:   Procedure Laterality Date   ??? HX HIP REPLACEMENT Bilateral        Prior Level of Function/Environment/Context: Patient lives with his wife.  He reports independence with ADLs and functional mobility PTA.      Home Situation  Home Environment: Private residence  # Steps to Enter: 3  Rails to Enter: Yes  One/Two Story Residence: Two Nutritional therapist of Interior Steps: 12   Living Alone: No  Support Systems: Games developer  Patient Expects to be Discharged to:: Private residence  Current DME Used/Available at Home: None  Tub or Shower Type: Advice worker dominance: Right    EXAMINATION OF PERFORMANCE DEFICITS:  Cognitive/Behavioral Status:  Neurologic State: Alert  Orientation Level: Oriented X4  Cognition: Appropriate decision making;Follows commands  Perception: Appears intact  Perseveration: No perseveration noted  Safety/Judgement: Awareness of environment;Good awareness of safety precautions    Skin: Intact in the uppers    Edema: None noted in the uppers    Hearing:       Vision/Perceptual:    Tracking: Able to track stimulus in all quadrants w/o difficulty    Diplopia: No    Acuity: Within Defined Limits       Range of Motion:  AROM: Within functional limits  PROM: Within functional limits    Strength:  WDL in the uppers    Coordination:  Fine Motor Skills-Upper: Left Intact;Right Intact    Gross Motor Skills-Upper: Left Intact;Right Intact    Tone & Sensation:  Tone: Normal  Sensation: Intact    Balance:  Sitting: Intact  Standing: Intact    Functional Mobility and Transfers for ADLs:  Bed Mobility:  Rolling: Independent  Supine to Sit: Independent  Sit to Supine: Independent  Scooting: Independent    Transfers:  Sit  to Stand: Independent  Stand to Sit: Independent  Bed to Chair: Independent  Toilet Transfer : Independent  Tub Transfer: Independent    ADL Assessment:  Feeding: Independent    Oral Facial Hygiene/Grooming: Independent    Bathing: Independent    Upper Body Dressing: Independent    Lower Body Dressing: Independent    Toileting: Independent     Cognitive Retraining  Safety/Judgement: Awareness of environment;Good awareness of safety precautions    Therapeutic Exercise:  Patient educated on the benefits of exercise and encouraged to complete frequently throughout the day as tolerated.  Instruction provided for the  following exercises: shoulder shrugs, shoulder flexion/extension, chest press/back row, elbow flexion/extension, wrist flexion/extension, finger flexion/extension.  Patient tolerated 1 sets x 10 reps without complaint and agreeable to complete at minimum 3x/day.       Functional Measure:  Barthel Index:    Bathing: 5  Bladder: 10  Bowels: 10  Grooming: 5  Dressing: 10  Feeding: 10  Mobility: 15  Stairs: 10  Toilet Use: 10  Transfer (Bed to Chair and Back): 15  Total: 100       Barthel and G-code impairment scale:  Percentage of impairment CH  0% CI  1-19% CJ  20-39% CK  40-59% CL  60-79% CM  80-99% CN  100%   Barthel Score 0-100 100 99-80 79-60 59-40 20-39 1-19   0   Barthel Score 0-20 20 17-19 13-16 9-12 5-8 1-4 0      The Barthel ADL Index: Guidelines  1. The index should be used as a record of what a patient does, not as a record of what a patient could do.  2. The main aim is to establish degree of independence from any help, physical or verbal, however minor and for whatever reason.  3. The need for supervision renders the patient not independent.  4. A patient's performance should be established using the best available evidence. Asking the patient, friends/relatives and nurses are the usual sources, but direct observation and common sense are also important. However direct testing is not needed.  5. Usually the patient's performance over the preceding 24-48 hours is important, but occasionally longer periods will be relevant.  6. Middle categories imply that the patient supplies over 50 per cent of the effort.  7. Use of aids to be independent is allowed.    Clarisa Kindred., Barthel, D.W. [phone]). Functional evaluation: the Barthel Index. Md State Med J (14)2.  Zenaida Niece der [place], J.J.M.F, [place], Ian Malkin., Margret Chance., [place], [place]. (1999). Measuring the change indisability after inpatient rehabilitation; comparison of the responsiveness of the Barthel Index and Functional  Independence Measure. Journal of Neurology, Neurosurgery, and Psychiatry, 66(4), [phone].  Dawson Bills, N.J.A, [place],  W.J.M, & [place], M.A. (2004.) Assessment of post-stroke quality of life in cost-effectiveness studies: The usefulness of the Barthel Index and the EuroQoL-5D. Quality of Life Research, 13, 098-11       G codes:  In compliance with CMS???s Claims Based Outcome Reporting, the following G-code set was chosen for this patient based on their primary functional limitation being treated:    The outcome measure chosen to determine the severity of the functional limitation was the Barthel Index with a score of 100/100 which was correlated with the impairment scale.    ? Self Care:    [phone] - CURRENT STATUS: CH - 0% impaired, limited or restricted   G9562 - GOAL STATUS: CH - 0% impaired, limited or restricted   Z3086 -  D/C STATUS:  CH - 0% impaired, limited or restricted     Occupational Therapy Evaluation Charge Determination   History Examination Decision-Making   LOW Complexity : Brief history review  LOW Complexity : 1-3 performance deficits relating to physical, cognitive , or psychosocial skils that result in activity limitations and / or participation restrictions  LOW Complexity : No comorbidities that affect functional and no verbal or physical assistance needed to complete eval tasks       Based on the above components, the patient evaluation is determined to be of the following complexity level: LOW      Activity Tolerance:   Patient tolerated eval well.    After treatment:   [x]   Patient left in no apparent distress sitting up in chair  []   Patient left in no apparent distress in bed  [x]   Call bell left within reach  [x]   Nursing notified  []   Caregiver present  []   Bed alarm activated    COMMUNICATION/EDUCATION:   Communication/Collaboration:  [x]       Home safety education was provided and the patient/caregiver indicated understanding.   [x]       Patient/family have participated as able and agree with findings and recommendations.  []       Patient is unable to participate in plan of care at this time.  Findings and recommendations were discussed with: Physical Therapist, Registered Nurse and patient.    Waunita SchoonerMary Eldridge, OTR/L  Time Calculation: 23 mins

## 2016-09-04 NOTE — Progress Notes (Signed)
Physical Exam   Skin: Skin is warm.

## 2016-09-04 NOTE — Progress Notes (Addendum)
[address]  [place], [place] [id number]   Office [phone]  Fax [phone]          Assessment and Plan     Elijah [name] is a 66 y.o. 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 He has spent 1 night(s) in the hospital.    24 Hour Events: NAEO. Pt did fine overnight. Would like to shower.     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 S/p 10 day augmentin and doxycycline at separate occasions respectively. MRI positive. Negative LE doppler.   - NPO after midnight  - Continue Vanc and cefepime per podiatry  - F/u podiatry consult, appreciate recs  - Evalute LE perfusion with ABI and LE PVR  - F/u BCx and Wound Cx  ??  HTN. poa 156/92  - Continue home Norvasc 5mg , lisinopril 20mg , bystolic 10mg    ??  Chronic peripheral neuropathy, familial   - Continue Cymbalta and Lyrica 300mg  BID  ??  HLD. TC 162, LDL 58, HDL 40, TG 322 on 1/02/723/27/18.  - Continue Lipitor 40mg    - Hold aspirin for now  ??  Obesity BMI Body mass index is 44.3 kg/(m^2).  - Encouraging lifestyle modifications and further follow up outpatient.   ??  FEN/GI - NPO after midnight  Activity - Ambulate as tolerated  DVT prophylaxis - Lovenox for tonight. Hold in the morning.  GI prophylaxis - Not indicated at this time  Fall prophylaxis - Not indicated at this time.  Disposition - Admit to Medical. Plan to d/c to Home. Consulting PT/OT  Code Status - DNR, discussed with patient / caregivers.    I appreciate the opportunity to participate in the care of this patient,  Yolonda Kidaaniel S Chenille Toor, MD  Family Medicine Resident         Subjective / Objective     Subjective Pt did well overnight. Denies Cp, sob, dizziness, fever/chills.     Temp (24hrs), Avg:98 ??F (36.7 ??C), Min:97.6 ??F (36.4 ??C), Max:98.5 ??F (36.9 ??C)     Objective   Visit Vitals   ??? BP 108/58 (BP 1 Location: Right arm, BP Patient Position: At rest)   ??? Pulse 60   ??? Temp 98 ??F (36.7 ??C)    ??? Resp 17   ??? Ht 6\' 2"  (1.88 m)   ??? Wt 345 lb (156.5 kg)   ??? SpO2 95%   ??? BMI 44.3 kg/m2     Respiratory:   O2 Device: Room air     I/O:    Date 09/03/16 0700 - 09/04/16 0659 09/04/16 0700 - 09/05/16 0659   Shift 0700-1859 1900-0659 24 Hour Total 0700-1859 1900-0659 24 Hour Total   I  N  T  A  K  E   Shift Total  (mL/kg)         O  U  T  P  U  T   Urine  (mL/kg/hr)  1000  (0.5) 1000  (0.3) 400  400      Urine Voided  1000 1000 400  400    Shift Total  (mL/kg)  1000  (6.4) 1000  (6.4) 400  (2.6)  400  (2.6)   NET  -1000 -1000 -400  -400   Weight (kg) 156.5 156.5 156.5 156.5 156.5 156.5       Inpatient Medications  Current Facility-Administered Medications   Medication Dose Route Frequency   ???  sodium chloride (NS) flush 5-10 mL  5-10 mL IntraVENous Q8H   ??? sodium chloride (NS) flush 5-10 mL  5-10 mL IntraVENous PRN   ??? cefepime (MAXIPIME) 2 g in 0.9% sodium chloride (MBP/ADV) 100 mL  2 g IntraVENous Q8H   ??? vancomycin (VANCOCIN) 2,000 mg in 0.9% sodium chloride 500 mL IVPB  2,000 mg IntraVENous Q12H   ??? DULoxetine (CYMBALTA) capsule 60 mg  60 mg Oral DAILY   ??? atorvastatin (LIPITOR) tablet 40 mg  40 mg Oral QHS   ??? nebivolol (BYSTOLIC) tablet 10 mg  10 mg Oral QHS   ??? lisinopril (PRINIVIL, ZESTRIL) tablet 20 mg  20 mg Oral DAILY   ??? amLODIPine (NORVASC) tablet 5 mg  5 mg Oral DAILY   ??? pregabalin (LYRICA) capsule 300 mg  300 mg Oral BID         Allergies  Allergies   Allergen Reactions   ??? Metoprolol Other (comments)     Patient states, made me feel horrible.         CBC:  Recent Labs      09/04/16   0222  09/03/16   1407   WBC  6.5  7.4   HGB  13.1  13.5   HCT  39.1  39.8   PLT  238  261       Metabolic Panel:  Recent Labs      09/04/16   0222  09/03/16   1407   NA  142  139   K  3.9  4.3   CL  111*  110*   CO2  21  23   BUN  25*  26*   CREA  1.24  1.43*   GLU  88  99   CA  8.4*  8.7   ALB  2.8*   --    SGOT  18   --    ALT  21   --          Physical Exam  General:   Alert, cooperative, no acute distress    Head:   Atraumatic   Eyes:   Conjunctivae clear   ENT:  Oral mucosa normal   Neck:  Supple, trachea midline, no adenopathy   No JVD   Back:    No CVA tenderness    Lungs:   Clear to auscultation bilaterally    Heart:   Regular rate and rhythm, no murmur   Abdomen:    Soft, non-tender, ND. No masses or organomegaly    Extremities:  No edema or DVT signs. Pt has marking from quell neuropathy applicator   Skin:  Warm and dry. Ulceration on R sole (2 - 2-3cm diameter with some drainage.   Neurologic:  Oriented. Decreased sensation below half mid calf all the way to toes. No other focal deficits       Urinary catheter:  none     Imaging/procedures:    EKG:        For Billing    Chief Complaint   Patient presents with   ??? Toe Pain       Hospital Problems  Date Reviewed: 10/02/16          Codes Class Noted POA    * (Principal)Osteomyelitis (HCC) ICD-10-CM: M86.9  ICD-9-CM: 730.20  09/03/2016 Yes        Polyneuropathy (Chronic) ICD-10-CM: G62.9  ICD-9-CM: 356.9  05/28/2016 Yes        Essential hypertension, benign (Chronic) ICD-10-CM: I10  ICD-9-CM: 401.1  02/21/2015 Yes    Overview Signed 08/10/2016  3:46 PM by Margarette Canada, LPN     Overview:   Qualifier: Diagnosis of   By: Rogue Bussing CMA, Jacqualynn              Coronary atherosclerosis of native coronary artery (Chronic) ICD-10-CM: I25.10  ICD-9-CM: 414.01  02/21/2015 Yes    Overview Addendum 08/10/2016  3:46 PM by Margarette Canada, LPN     S/p CABG x5. DOE was presenting complaint.

## 2016-09-04 NOTE — Consults (Addendum)
Francoise Schaumann, DPM - Greig Castilla K. Posey Pronto, DPM - [place]. [name], [place]                                                 Podiatric Surgery Consultation  Assessment/Plan:  Mr. 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 Zelman is against amputation until all other treatment options have been pursued  - He is agreeable to go for a removal of foreign body of the rt 2nd toe  - I told him that it's unclear  if there is a foreign body on MRI and that I may not find anything tomorrow, but I think that it's reasonable to explore his toe since it is red, warm, and draining pus  - I will also perform a bone biopsy of the 2nd metatarsal head to evaluate for osteomyelitis and grow cultures.   -To the OR on 6/10 '@9am'  for right foot incision and drainage and removal of foreign body, right 2nd metatarsal bone biopsy  - Followup ABI/PVR, consult Dr. Suzan Slick if there is impaired blood flow  - Pnt will need PICC/6 weeks of IV abx  - Pt can f/u with [place] at the [facility] [phone], or at our private office.  - Thank you for this consultation.  Please do not hesitate to call with any questions.    Subjective:      HPI:  Mr. Slowey is a 40M who presents with a right foot wound. He has been following with his family medicine doctor. He initially stepped on a broken light fixture. He stated that those wounds on the bottom of his foot never healed. He was put on po abx. He continued to have a red swollen foot. The FM doctor ordered a MRI and it noted possible om of the 2nd digit proximal phalanx, possible septic arthritis of the 2nd mtpj, and possibly a fb of the plantar 2nd proximal phalanx head.     REVIEW OF SYSTEMS:   CONSTITUTIONAL: No fever, chills, weakness or fatigue.   SKIN: right foot wound   CARDIOVASCULAR: No chest pain, no palpitations, no chest discomfort  RESPIRATORY: No shortness of breath, cough or sputum.   GASTROINTESTINAL: No anorexia, nausea, vomiting or diarrhea. No abdominal pain or blood.   NEUROLOGICAL: No headache, dizziness, syncope, paralysis, ataxia  MUSCULOSKELETAL: No muscle, back pain, joint pain or stiffness.   HEMATOLOGIC: No excessive bleeding, no excessive bruising.   LYMPHATICS: No enlarged nodes, no palpable lymph node mases  PSYCHIATRIC: Denies feeling depressed, anxious   ENDOCRINOLOGIC: No reports of sweating, cold or heat intolerance.       History:  Osteomyelitis (HCC)  Allergies   Allergen Reactions   ??? Metoprolol Other (comments)     Patient states, made me feel horrible.     Family History   Problem Relation Age of Onset   ??? Stroke Mother    ??? Other Father      mrsa infxn caused death   ??? Heart Attack Brother       Past Medical History:   Diagnosis Date   ??? BPH (benign prostatic hyperplasia)    ??? CAD (  coronary artery disease)    ??? HLD (hyperlipidemia)    ??? HTN (hypertension)    ??? Morbid obesity due to excess calories ([place]) 04/24/2015   ??? OSA (obstructive sleep apnea)    ??? Polyneuropathy    ??? Tubular adenoma of colon     x2 on colonoscopy 09/18/15     Past Surgical History:   Procedure Laterality Date   ??? HX HIP REPLACEMENT Bilateral      Social History   Substance Use Topics   ??? Smoking status: Former Smoker     Types: Cigarettes   ??? Smokeless tobacco: Never Used   ??? Alcohol use No      Comment: recently has stopped       History   Alcohol Use No     Comment: recently has stopped     History   Drug Use No      History   Smoking Status   ??? Former Smoker   ??? Types: Cigarettes   Smokeless Tobacco   ??? Never Used     Current Facility-Administered Medications   Medication Dose Route Frequency   ??? sodium chloride (NS) flush 5-10 mL  5-10 mL IntraVENous Q8H   ??? sodium chloride (NS) flush 5-10 mL  5-10 mL IntraVENous PRN    ??? cefepime (MAXIPIME) 2 g in 0.9% sodium chloride (MBP/ADV) 100 mL  2 g IntraVENous Q8H   ??? vancomycin (VANCOCIN) 2,000 mg in 0.9% sodium chloride 500 mL IVPB  2,000 mg IntraVENous Q12H   ??? DULoxetine (CYMBALTA) capsule 60 mg  60 mg Oral DAILY   ??? atorvastatin (LIPITOR) tablet 40 mg  40 mg Oral QHS   ??? nebivolol (BYSTOLIC) tablet 10 mg  10 mg Oral QHS   ??? lisinopril (PRINIVIL, ZESTRIL) tablet 20 mg  20 mg Oral DAILY   ??? amLODIPine (NORVASC) tablet 5 mg  5 mg Oral DAILY   ??? pregabalin (LYRICA) capsule 300 mg  300 mg Oral BID        Objective:  Vitals: Patient Vitals for the past 12 hrs:   BP Temp Pulse Resp SpO2   09/04/16 1109 134/82 97.5 ??F (36.4 ??C) 60 16 97 %   09/04/16 0709 108/58 98 ??F (36.7 ??C) 60 17 95 %   09/04/16 0432 116/61 98 ??F (36.7 ??C) 63 16 96 %       Vascular:  Right DP 0/4; PT 0/4  Left DP 1/4; PT 1/4  Capillary fill time <2 seconds  Edema pitting to the right foot. No edema to the left foot  Skin temperature is warm ont eh right foot  Varicosities are absent  Bilateral legs with no evidence of hemosiderin deposits    Dermatological:  Nails are thickened, elongated, discolored, painful to palpation, 88m thick, with subungual debris.   Skin is dry and scaly   No evidence of tinea pedis  No hyperkeratotic lesions     Wound #1  Location: rt plantar 2nd met wound  Etiology: diabetic  Size: see iHeal notes  Margins: hyperkeratotic  Drainage: sero purulence  Odor: yes  Wound base: devitalized tissue  Depth: does not probe to bone  Probes to bone? no  Undermining? no  Sinus tracts? possibly  Fluctuance/Subcutaneous crepitus? no   Ascending cellulitis/Lymphangitic streaking? Yes    Wound #2  Location: rt plantar medial 1st met  Etiology: diabetic  Size: see iHeal notes  Margins: hyperkeratotic  Drainage: none  Odor: no  Wound base: sc fat  Depth: sc  fat  Probes to bone? no  Undermining? no  Sinus tracts? no  Fluctuance/Subcutaneous crepitus? no   Ascending cellulitis/Lymphangitic streaking? No    Wound #3   Location: rt dorsal 2nd pipj  Etiology: diabetic  Size: see iHeal notes  Margins: hyperkeratotic  Drainage: purulence  Odor: yes  Wound base: sc fat  Depth: sc fat  Probes to bone? no  Undermining? no  Sinus tracts? no  Fluctuance/Subcutaneous crepitus? no   Ascending cellulitis/Lymphangitic streaking? No    No wound at the plantar aspect of the left 2nd digit at the location of the possible fb    Neurological:  Protective sensation per 5.07 Semmes Weinstein monofilament is Rt 0/10 and Lt 0/10  Vibratory sensation at the Rt 1st MPJ reduced/ LT 1st MPJ reduced  Epicritic sensation is intact.  Patient is AAOx3, mood is normal.       Orthopedic:  B/L LE are symmetric  ROM of ankle, STJ, 1st MTPJ is limited  MMT 5 out of 5 for B/L LE.     No pedal amputations noted.      Constitutional: Pt is a 39M obese caucasian.     Lymphatics: negative tenderness to palpation of neck/axillary/inguinal nodes.        Imaging / Cx / Px:  09/03/16 MRI Rt w/o Contrast  IMPRESSION:   1. Second MTP joint dorsal subluxation and evidence of septic arthritis with  likely osteomyelitis of the second metatarsal head and second proximal phalanx.  If there is no clinical signs of infection, findings could represent an  inflammatory arthritis. Consider fluid sampling from the second MTP joint or  second proximal phalanx periosteum.  2. Great toe osteoarthritis and reactive edema. Osteomyelitis is less likely in  the first ray.  3. 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  4. Skin ulcerations. Cellulitis and myositis. No evidence of drainable abscess  within the limitation of motion artifact and no intravenous contrast.  EPIC email sent to Dr. Donalda Ewings at the time of the dictation.    Addendum:   ??  I discussed the results with Dr. Donalda Ewings at 9:30 AM on 09/03/2016. After reviewing  the images again, there may be tiny foreign bodies in the periosteal fluid of   the second toe at the distal aspect of the second proximal phalanx. Otherwise,  no change in findings or impression.      Labs:  Recent Labs      09/04/16   0223  09/04/16   0222   WBC   --   6.5   CRP  3.71*   --    CREA   --   1.24   BUN   --   25*   GLU   --   88   HGB   --   13.1   HCT   --   39.1   NA   --   142   K   --   3.9   CL   --   111*   CO2   --   21

## 2016-09-04 NOTE — Consults (Signed)
Consults by Elijah [name],  Elijah [name] at 09/04/16 1148                Author: [name], Elijah [name]  Service: Podiatry  Author Type: Physician       Filed: 09/04/16 1206  Date of Service: 09/04/16 1148  Status: Addendum          Editor: Elijah [name], Beulah Capobianco [name]          Related Notes: Original Note by Elijah [name], Andi Layfield [name] filed at 09/04/16 1205            Consult Orders        1. IP CONSULT TO PODIATRY [[id number]] ordered by Elijah Severe, MD at 09/03/16 Elijah [name], Elijah [name] - Elijah [name], Elijah [name] - Elijah [name]. 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    -To the OR on 6/10 _0  for right foot incision and drainage and removal of foreign body, right 2nd metatarsal bone biopsy   - Followup ABI/PVR, consult Dr. Suzan Slick if there is impaired blood flow   - Pnt will need PICC/6 weeks of IV abx   - Pt can f/u with [place] at the [facility] [phone], or at our private office.   - Thank you for this consultation.  Please do not hesitate to call with any questions.      Subjective:         HPI:   Elijah [name] is a 41M who presents with a right foot wound. He has been following with his family medicine  doctor. He initially stepped on a broken light fixture. He stated that those wounds on the bottom  of his foot never healed. He was put on po abx. He continued to have a red swollen foot. The FM doctor ordered a MRI and it noted possible om of the 2nd digit proximal phalanx, possible  septic arthritis of the 2nd mtpj, and possibly a fb of the plantar  2nd proximal phalanx head.       REVIEW OF SYSTEMS:    CONSTITUTIONAL: No fever, chills, weakness or fatigue.    SKIN: right foot wound   CARDIOVASCULAR: No chest pain, no palpitations, no chest discomfort   RESPIRATORY: No shortness of breath, cough or sputum.    GASTROINTESTINAL: No anorexia, nausea, vomiting or diarrhea. No abdominal pain or blood.    NEUROLOGICAL: No headache, dizziness, syncope, paralysis, ataxia   MUSCULOSKELETAL: No muscle, back pain, joint pain or stiffness.    HEMATOLOGIC: No excessive bleeding, no excessive bruising.    LYMPHATICS: No enlarged nodes, no palpable lymph node mases   PSYCHIATRIC: Denies feeling depressed, anxious    ENDOCRINOLOGIC: No reports of sweating, cold or heat intolerance.          History:   Osteomyelitis (HCC)     Allergies        Allergen  Reactions         ?  Metoprolol  Other (comments)             Patient states, made me feel horrible.          Family History         Problem  Relation  Age of Onset          ?  Stroke  Mother       ?  Other  Father               mrsa infxn caused death          ?  Heart Attack  Brother             Past Medical History:        Diagnosis  Date         ?  BPH (benign prostatic hyperplasia)       ?  CAD (coronary artery disease)       ?  HLD (hyperlipidemia)       ?  HTN (hypertension)       ?  Morbid obesity due to excess calories ([place])  04/24/2015     ?  OSA (obstructive sleep apnea)       ?  Polyneuropathy       ?  Tubular adenoma of colon            x2 on colonoscopy 09/18/15          Past Surgical History:         Procedure  Laterality  Date          ?  HX HIP REPLACEMENT  Bilateral             Social History       Substance Use Topics         ?  Smoking status:  Former Smoker              Types:  Cigarettes         ?  Smokeless tobacco:  Never Used     ?  Alcohol use  No                Comment: recently has stopped            History        Alcohol Use  No             Comment: recently has stopped  History        Drug Use  No           History       Smoking Status        ?  Former Smoker         ?  Types:  Cigarettes       Smokeless Tobacco        ?  Never Used          Current Facility-Administered Medications          Medication  Dose  Route  Frequency           ?  sodium chloride (NS) flush 5-10 mL   5-10 mL  IntraVENous  Q8H     ?  sodium chloride (NS) flush 5-10 mL   5-10 mL  IntraVENous  PRN     ?  cefepime (MAXIPIME) 2 g in 0.9% sodium chloride (MBP/ADV) 100 mL   2 g  IntraVENous  Q8H     ?  vancomycin (VANCOCIN) 2,000 mg in 0.9% sodium chloride 500 mL IVPB   2,000 mg  IntraVENous  Q12H     ?  DULoxetine (CYMBALTA) capsule 60 mg   60 mg  Oral  DAILY     ?  atorvastatin (LIPITOR) tablet 40 mg   40 mg  Oral  QHS     ?  nebivolol (BYSTOLIC) tablet 10 mg   10 mg  Oral  QHS     ?  lisinopril (PRINIVIL, ZESTRIL) tablet 20 mg   20 mg  Oral  DAILY     ?  amLODIPine (NORVASC) tablet 5 mg   5 mg  Oral  DAILY           ?  pregabalin (LYRICA) capsule 300 mg   300 mg  Oral  BID            Objective:   Vitals: Patient Vitals for the past 12 hrs:            BP  Temp  Pulse  Resp  SpO2     09/04/16 1109  134/82  97.5 ??F (36.4 ??C)  60  16  97 %     09/04/16 0709  108/58  98 ??F (36.7 ??C)  60  17  95 %     09/04/16 0432  116/61  98 ??F (36.7 ??C)  63  16  96 %           Vascular:   Right DP 0/4; PT 0/4   Left DP 1/4; PT 1/4   Capillary fill time <2 seconds   Edema pitting to the right foot. No edema to the left foot   Skin temperature is warm ont eh right foot   Varicosities are absent   Bilateral legs with no evidence of hemosiderin deposits      Dermatological:   Nails are thickened, elongated,  discolored, painful to palpation, 39m thick, with subungual debris.    Skin is dry and scaly    No evidence of tinea pedis   No hyperkeratotic lesions       Wound #1   Location: rt plantar 2nd met wound   Etiology: diabetic   Size: see iHeal notes   Margins: hyperkeratotic   Drainage: sero purulence   Odor: yes   Wound base: devitalized tissue   Depth: does not probe to bone   Probes to bone? no   Undermining? no  Sinus tracts? possibly   Fluctuance/Subcutaneous crepitus? no    Ascending cellulitis/Lymphangitic streaking? Yes      Wound #2   Location: rt plantar medial 1st met   Etiology: diabetic   Size: see iHeal notes   Margins: hyperkeratotic   Drainage: none   Odor: no   Wound base: sc fat   Depth: sc fat   Probes to bone? no   Undermining? no   Sinus tracts? no   Fluctuance/Subcutaneous crepitus? no    Ascending cellulitis/Lymphangitic streaking? No      Wound #3   Location: rt dorsal 2nd pipj   Etiology: diabetic   Size: see iHeal notes   Margins: hyperkeratotic   Drainage: purulence   Odor: yes   Wound base: sc fat   Depth: sc fat   Probes to bone? no   Undermining? no   Sinus tracts? no   Fluctuance/Subcutaneous crepitus? no    Ascending cellulitis/Lymphangitic streaking? No      No wound at the plantar aspect of the left 2nd digit at the location of the possible fb      Neurological:   Protective sensation per 5.07 Semmes Weinstein monofilament is Rt 0/10 and Lt 0/10   Vibratory sensation at the Rt 1st MPJ reduced/ LT 1st MPJ reduced   Epicritic sensation is intact.   Patient is AAOx3, mood is normal.         Orthopedic:   B/L LE are symmetric   ROM of ankle, STJ, 1st MTPJ is limited   MMT 5 out of 5 for B/L LE.      No pedal amputations noted.        Constitutional: Pt is a 29M obese caucasian.       Lymphatics: negative tenderness to palpation of neck/axillary/inguinal nodes.            Imaging / Cx / Px:   09/03/16 MRI Rt w/o Contrast   IMPRESSION:    1. Second MTP joint dorsal subluxation and evidence  of septic arthritis with   likely osteomyelitis of the second metatarsal head and second proximal phalanx.   If there is no clinical signs of infection, findings could represent an   inflammatory arthritis. Consider fluid sampling from the second MTP joint or   second proximal phalanx periosteum.   2. Great toe osteoarthritis and reactive edema. Osteomyelitis is less likely in   the first ray.   3. Third metatarsal head nonacute osteonecrosis is more likely than   osteomyelitis.   4. Skin ulcerations. Cellulitis and myositis. No evidence of drainable abscess   within the limitation of motion artifact and no intravenous contrast.   EPIC email sent to Dr. Donalda Ewings at the time of the dictation.      Addendum:    ??   I discussed the results with Dr. Donalda Ewings at 9:30 AM on 09/03/2016. After reviewing   the images again, there may be tiny foreign bodies in the periosteal fluid of   the second toe at the distal aspect of the second proximal phalanx. Otherwise,   no change in findings or impression.         Labs:     Recent Labs             09/04/16    0223   09/04/16    0222     WBC    --    6.5     CRP   3.71*    --  CREA    --    1.24     BUN    --    25*     GLU    --    88     HGB    --    13.1     HCT    --    39.1     NA    --    142     K    --    3.9     CL    --    111*         CO2    --    21

## 2016-09-05 ENCOUNTER — Inpatient Hospital Stay: Admit: 2016-09-05 | Payer: MEDICARE | Primary: Family Medicine

## 2016-09-05 LAB — METABOLIC PANEL, COMPREHENSIVE
A-G Ratio: 0.7 — ABNORMAL LOW (ref 1.1–2.2)
ALT (SGPT): 24 U/L (ref 12–78)
AST (SGOT): 18 U/L (ref 15–37)
Albumin: 2.7 g/dL — ABNORMAL LOW (ref 3.5–5.0)
Alk. phosphatase: 66 U/L (ref 45–117)
Anion gap: 8 mmol/L (ref 5–15)
BUN/Creatinine ratio: 15 (ref 12–20)
BUN: 17 MG/DL (ref 6–20)
Bilirubin, total: 0.5 MG/DL (ref 0.2–1.0)
CO2: 21 mmol/L (ref 21–32)
Calcium: 8.5 MG/DL (ref 8.5–10.1)
Chloride: 113 mmol/L — ABNORMAL HIGH (ref 97–108)
Creatinine: 1.17 MG/DL (ref 0.70–1.30)
GFR est AA: >60 mL/min/{1.73_m2} (ref >60)
GFR est non-AA: >60 mL/min/{1.73_m2} (ref >60)
Globulin: 3.8 g/dL (ref 2.0–4.0)
Glucose: 90 mg/dL (ref 65–100)
Potassium: 4 mmol/L (ref 3.5–5.1)
Protein, total: 6.5 g/dL (ref 6.4–8.2)
Sodium: 142 mmol/L (ref 136–145)

## 2016-09-05 LAB — CBC WITH AUTOMATED DIFF
ABS. BASOPHILS: 0.1 10*3/uL (ref 0.0–0.1)
ABS. EOSINOPHILS: 0.2 10*3/uL (ref 0.0–0.4)
ABS. IMM. GRANS.: 0 10*3/uL (ref 0.00–0.04)
ABS. LYMPHOCYTES: 1.1 10*3/uL (ref 0.8–3.5)
ABS. MONOCYTES: 0.8 10*3/uL (ref 0.0–1.0)
ABS. NEUTROPHILS: 3.9 10*3/uL (ref 1.8–8.0)
ABSOLUTE NRBC: 0 10*3/uL (ref 0.00–0.01)
BASOPHILS: 1 % (ref 0–1)
EOSINOPHILS: 3 % (ref 0–7)
HCT: 40.2 % (ref 36.6–50.3)
HGB: 13 g/dL (ref 12.1–17.0)
IMMATURE GRANULOCYTES: 1 % — ABNORMAL HIGH (ref 0.0–0.5)
LYMPHOCYTES: 19 % (ref 12–49)
MCH: 30.2 PG (ref 26.0–34.0)
MCHC: 32.3 g/dL (ref 30.0–36.5)
MCV: 93.5 FL (ref 80.0–99.0)
MONOCYTES: 13 % (ref 5–13)
MPV: 10.4 FL (ref 8.9–12.9)
NEUTROPHILS: 63 % (ref 32–75)
NRBC: 0 PER 100 WBC
PLATELET: 242 10*3/uL (ref 150–400)
RBC: 4.3 M/uL (ref 4.10–5.70)
RDW: 14 % (ref 11.5–14.5)
WBC: 6.1 10*3/uL (ref 4.1–11.1)

## 2016-09-05 LAB — C REACTIVE PROTEIN, QT: C-Reactive protein: 4.43 mg/dL — ABNORMAL HIGH (ref <0.60)

## 2016-09-05 LAB — CULTURE, WOUND W GRAM STAIN
GRAM STAIN: NONE SEEN
GRAM STAIN: NONE SEEN

## 2016-09-05 MED ORDER — SODIUM CHLORIDE 0.9 % INJECTION
INTRAMUSCULAR | Status: AC
Start: 2016-09-05 — End: ?

## 2016-09-05 MED ORDER — BACITRACIN 50,000 UNIT IM
50000 unit | INTRAMUSCULAR | Status: DC | PRN
Start: 2016-09-05 — End: 2016-09-05
  Administered 2016-09-05: 13:00:00

## 2016-09-05 MED ORDER — SODIUM CHLORIDE 0.9 % IJ SYRG
INTRAMUSCULAR | Status: DC | PRN
Start: 2016-09-05 — End: 2016-09-05

## 2016-09-05 MED ORDER — ENOXAPARIN 40 MG/0.4 ML SUB-Q SYRINGE
40 mg/0.4 mL | Freq: Two times a day (BID) | SUBCUTANEOUS | Status: DC
Start: 2016-09-05 — End: 2016-09-09
  Administered 2016-09-06 – 2016-09-09 (×9): via SUBCUTANEOUS

## 2016-09-05 MED ORDER — FENTANYL CITRATE (PF) 50 MCG/ML IJ SOLN
50 mcg/mL | INTRAMUSCULAR | Status: AC
Start: 2016-09-05 — End: ?

## 2016-09-05 MED ORDER — LIDOCAINE (PF) 10 MG/ML (1 %) IJ SOLN
10 mg/mL (1 %) | INTRAMUSCULAR | Status: DC | PRN
Start: 2016-09-05 — End: 2016-09-05
  Administered 2016-09-05: 13:00:00 via SUBCUTANEOUS

## 2016-09-05 MED ORDER — DIPHENHYDRAMINE HCL 50 MG/ML IJ SOLN
50 mg/mL | INTRAMUSCULAR | Status: DC | PRN
Start: 2016-09-05 — End: 2016-09-05

## 2016-09-05 MED ORDER — HYDROMORPHONE 0.5 MG/0.5 ML SYRINGE
0.5 mg/ mL | INTRAMUSCULAR | Status: DC | PRN
Start: 2016-09-05 — End: 2016-09-05

## 2016-09-05 MED ORDER — BUPIVACAINE (PF) 0.5 % (5 MG/ML) IJ SOLN
0.5 % (5 mg/mL) | INTRAMUSCULAR | Status: DC | PRN
Start: 2016-09-05 — End: 2016-09-05
  Administered 2016-09-05: 13:00:00 via SUBCUTANEOUS

## 2016-09-05 MED ORDER — PHARMACY VANCOMYCIN NOTE
Freq: Once | Status: AC
Start: 2016-09-05 — End: 2016-09-06
  Administered 2016-09-06: 01:00:00

## 2016-09-05 MED ORDER — LACTATED RINGERS IV
INTRAVENOUS | Status: DC
Start: 2016-09-05 — End: 2016-09-05

## 2016-09-05 MED ORDER — MIDAZOLAM 1 MG/ML IJ SOLN
1 mg/mL | INTRAMUSCULAR | Status: DC | PRN
Start: 2016-09-05 — End: 2016-09-05

## 2016-09-05 MED ORDER — HEPARIN (PORCINE) 5,000 UNIT/ML IJ SOLN
5000 unit/mL | Freq: Three times a day (TID) | INTRAMUSCULAR | Status: DC
Start: 2016-09-05 — End: 2016-09-05

## 2016-09-05 MED ORDER — LIDOCAINE HCL 1 % (10 MG/ML) IJ SOLN
10 mg/mL (1 %) | INTRAMUSCULAR | Status: AC
Start: 2016-09-05 — End: ?

## 2016-09-05 MED ORDER — SODIUM CHLORIDE 0.9 % IJ SYRG
Freq: Three times a day (TID) | INTRAMUSCULAR | Status: DC
Start: 2016-09-05 — End: 2016-09-05
  Administered 2016-09-05: 11:00:00 via INTRAVENOUS

## 2016-09-05 MED ORDER — EPHEDRINE SULFATE 50 MG/ML INJECTION SOLUTION
50 mg/mL | INTRAMUSCULAR | Status: DC | PRN
Start: 2016-09-05 — End: 2016-09-05
  Administered 2016-09-05 (×2): via INTRAVENOUS

## 2016-09-05 MED ORDER — FENTANYL CITRATE (PF) 50 MCG/ML IJ SOLN
50 mcg/mL | INTRAMUSCULAR | Status: DC | PRN
Start: 2016-09-05 — End: 2016-09-05
  Administered 2016-09-05: 13:00:00 via INTRAVENOUS

## 2016-09-05 MED ORDER — LIDOCAINE (PF) 20 MG/ML (2 %) IV SYRINGE
100 mg/5 mL (2 %) | INTRAVENOUS | Status: AC
Start: 2016-09-05 — End: ?

## 2016-09-05 MED ORDER — LACTATED RINGERS IV
INTRAVENOUS | Status: DC
Start: 2016-09-05 — End: 2016-09-05
  Administered 2016-09-05: 13:00:00 via INTRAVENOUS

## 2016-09-05 MED ORDER — LIDOCAINE (PF) 20 MG/ML (2 %) IJ SOLN
20 mg/mL (2 %) | INTRAMUSCULAR | Status: DC | PRN
Start: 2016-09-05 — End: 2016-09-05
  Administered 2016-09-05: 13:00:00 via INTRAVENOUS

## 2016-09-05 MED ORDER — PROPOFOL 10 MG/ML IV EMUL
10 mg/mL | INTRAVENOUS | Status: DC | PRN
Start: 2016-09-05 — End: 2016-09-05
  Administered 2016-09-05: 13:00:00 via INTRAVENOUS

## 2016-09-05 MED ORDER — SODIUM CHLORIDE 0.9 % IV
10 mg/mL | INTRAVENOUS | Status: DC | PRN
Start: 2016-09-05 — End: 2016-09-05
  Administered 2016-09-05: 14:00:00 via INTRAVENOUS

## 2016-09-05 MED ORDER — ONDANSETRON (PF) 4 MG/2 ML INJECTION
4 mg/2 mL | INTRAMUSCULAR | Status: AC
Start: 2016-09-05 — End: ?

## 2016-09-05 MED ORDER — WHITE PETROLATUM-MINERAL OIL 83 %-15 % EYE OINTMENT
83-15 % | OPHTHALMIC | Status: AC
Start: 2016-09-05 — End: ?

## 2016-09-05 MED ORDER — MIDAZOLAM 1 MG/ML IJ SOLN
1 mg/mL | INTRAMUSCULAR | Status: DC | PRN
Start: 2016-09-05 — End: 2016-09-05
  Administered 2016-09-05: 13:00:00 via INTRAVENOUS

## 2016-09-05 MED ORDER — BACITRACIN 50,000 UNIT IM
50000 unit | INTRAMUSCULAR | Status: AC
Start: 2016-09-05 — End: ?

## 2016-09-05 MED ORDER — ONDANSETRON (PF) 4 MG/2 ML INJECTION
4 mg/2 mL | INTRAMUSCULAR | Status: DC | PRN
Start: 2016-09-05 — End: 2016-09-05
  Administered 2016-09-05: 14:00:00 via INTRAVENOUS

## 2016-09-05 MED ORDER — ROCURONIUM 10 MG/ML IV
10 mg/mL | INTRAVENOUS | Status: AC
Start: 2016-09-05 — End: ?

## 2016-09-05 MED ORDER — EPHEDRINE SULFATE 50 MG/ML INJECTION SOLUTION
50 mg/mL | INTRAMUSCULAR | Status: AC
Start: 2016-09-05 — End: ?

## 2016-09-05 MED ORDER — MIDAZOLAM 1 MG/ML IJ SOLN
1 mg/mL | INTRAMUSCULAR | Status: AC
Start: 2016-09-05 — End: ?

## 2016-09-05 MED ORDER — BUPIVACAINE (PF) 0.5 % (5 MG/ML) IJ SOLN
0.5 % (5 mg/mL) | INTRAMUSCULAR | Status: AC
Start: 2016-09-05 — End: ?

## 2016-09-05 MED ORDER — PHENYLEPHRINE 10 MG/ML INJECTION
10 mg/mL | INTRAMUSCULAR | Status: AC
Start: 2016-09-05 — End: ?

## 2016-09-05 MED FILL — BD POSIFLUSH NORMAL SALINE 0.9 % INJECTION SYRINGE: INTRAMUSCULAR | Qty: 10

## 2016-09-05 MED FILL — BACITRACIN 50,000 UNIT IM: 50000 unit | INTRAMUSCULAR | Qty: 50000

## 2016-09-05 MED FILL — LISINOPRIL 20 MG TAB: 20 mg | ORAL | Qty: 1

## 2016-09-05 MED FILL — LACTATED RINGERS IV: INTRAVENOUS | Qty: 1000

## 2016-09-05 MED FILL — PROPOFOL 10 MG/ML IV EMUL: 10 mg/mL | INTRAVENOUS | Qty: 175

## 2016-09-05 MED FILL — DULOXETINE 30 MG CAP, DELAYED RELEASE: 30 mg | ORAL | Qty: 2

## 2016-09-05 MED FILL — CEFEPIME 2 GRAM SOLUTION FOR INJECTION: 2 gram | INTRAMUSCULAR | Qty: 2

## 2016-09-05 MED FILL — LIDOCAINE (PF) 20 MG/ML (2 %) IJ SOLN: 20 mg/mL (2 %) | INTRAMUSCULAR | Qty: 100

## 2016-09-05 MED FILL — LYRICA 75 MG CAPSULE: 75 mg | ORAL | Qty: 4

## 2016-09-05 MED FILL — BUPIVACAINE (PF) 0.5 % (5 MG/ML) IJ SOLN: 0.5 % (5 mg/mL) | INTRAMUSCULAR | Qty: 30

## 2016-09-05 MED FILL — EPHEDRINE SULFATE 50 MG/ML IJ SOLN: 50 mg/mL | INTRAMUSCULAR | Qty: 1

## 2016-09-05 MED FILL — VANCOMYCIN 10 GRAM IV SOLR: 10 gram | INTRAVENOUS | Qty: 2000

## 2016-09-05 MED FILL — AMLODIPINE 5 MG TAB: 5 mg | ORAL | Qty: 1

## 2016-09-05 MED FILL — PHARMACY VANCOMYCIN NOTE: Qty: 1

## 2016-09-05 MED FILL — MIDAZOLAM 1 MG/ML IJ SOLN: 1 mg/mL | INTRAMUSCULAR | Qty: 5

## 2016-09-05 MED FILL — SODIUM CHLORIDE 0.9 % INJECTION: INTRAMUSCULAR | Qty: 10

## 2016-09-05 MED FILL — ONDANSETRON (PF) 4 MG/2 ML INJECTION: 4 mg/2 mL | INTRAMUSCULAR | Qty: 2

## 2016-09-05 MED FILL — PHENYLEPHRINE 10 MG/ML INJECTION: 10 mg/mL | INTRAMUSCULAR | Qty: 1

## 2016-09-05 MED FILL — BYSTOLIC 5 MG TABLET: 5 mg | ORAL | Qty: 2

## 2016-09-05 MED FILL — EPHEDRINE SULFATE 50 MG/ML INTRAVENOUS SOLUTION: 50 mg/mL | INTRAVENOUS | Qty: 20

## 2016-09-05 MED FILL — FENTANYL CITRATE (PF) 50 MCG/ML IJ SOLN: 50 mcg/mL | INTRAMUSCULAR | Qty: 5

## 2016-09-05 MED FILL — LIDOCAINE (PF) 20 MG/ML (2 %) IV SYRINGE: 100 mg/5 mL (2 %) | INTRAVENOUS | Qty: 5

## 2016-09-05 MED FILL — ARTIFICIAL TEARS (PETROLATUM/MINERAL OIL) 83 %-15 % EYE OINTMENT: 83-15 % | OPHTHALMIC | Qty: 3.5

## 2016-09-05 MED FILL — ATORVASTATIN 20 MG TAB: 20 mg | ORAL | Qty: 2

## 2016-09-05 MED FILL — LIDOCAINE HCL 1 % (10 MG/ML) IJ SOLN: 10 mg/mL (1 %) | INTRAMUSCULAR | Qty: 20

## 2016-09-05 MED FILL — ONDANSETRON (PF) 4 MG/2 ML INJECTION: 4 mg/2 mL | INTRAMUSCULAR | Qty: 4

## 2016-09-05 MED FILL — VAZCULEP 10 MG/ML INJECTION SOLUTION: 10 mg/mL | INTRAMUSCULAR | Qty: 50

## 2016-09-05 MED FILL — ROCURONIUM 10 MG/ML IV: 10 mg/mL | INTRAVENOUS | Qty: 5

## 2016-09-05 NOTE — Progress Notes (Signed)
Pnt was still groggy from anesthesia when I attempted to talk to him post op. I called the patient's room just now and no answer. I will round on him tomorrow to discuss how the case went.

## 2016-09-05 NOTE — Anesthesia Post-Procedure Evaluation (Signed)
Post-Anesthesia Evaluation and Assessment    Patient: Elijah [name] MRN: [id number]  SSN: [id number]    Date of Birth: May 30, 1950  Age: 66 y.o.  Sex: male       Cardiovascular Function/Vital Signs  Visit Vitals   ??? BP 125/67 (BP 1 Location: Left arm, BP Patient Position: At rest)   ??? Pulse 60   ??? Temp 36.7 ??C (98 ??F)   ??? Resp 16   ??? Ht 6\' 2"  (1.88 m)   ??? Wt 156.5 kg (345 lb)   ??? SpO2 98%   ??? BMI 44.3 kg/m2       Patient is status post general anesthesia for Procedure(s):  INCISION AND DRAINAGERIGHT FOOT, FOREIGN BODY REMOVAL, 2ND METATARSAL BONE BIOPSY.    Nausea/Vomiting: None    Postoperative hydration reviewed and adequate.    Pain:  Pain Scale 1: Numeric (0 - 10) (09/05/16 1030)  Pain Intensity 1: 0 (09/05/16 1030)   Managed    Neurological Status:   Neuro (WDL): Within Defined Limits (09/05/16 1030)  Neuro  Neurologic State: Drowsy (09/05/16 1000)  Orientation Level: Oriented X4 (09/05/16 1000)  Cognition: Appropriate for age attention/concentration (09/05/16 1000)  Speech: Clear (09/05/16 0843)  LUE Motor Response: Purposeful (09/05/16 1000)  LLE Motor Response: Purposeful (09/05/16 1000)  RUE Motor Response: Purposeful (09/05/16 1000)  RLE Motor Response: Purposeful (09/05/16 1000)   At baseline    Mental Status and Level of Consciousness: Arousable    Pulmonary Status:   O2 Device: Nasal cannula (09/05/16 1030)   Adequate oxygenation and airway patent    Complications related to anesthesia: None    Post-anesthesia assessment completed. No concerns    Signed By: Tristan SchroederBrian Armas Mcbee, MD     September 05, 2016

## 2016-09-05 NOTE — Op Note (Addendum)
Operative Report  ??  Procedure date: 09/05/16   ??  Surgeon: Estelita Iten [name], DPM  ??  1st Asst: none  ??  Preop diagnosis: Right foot abscess, foreign body, osteomyelitis  ??  Post op diagnosis: Right foot abscess, osteomyelitis  ??  Procedure: Complex incision and drainage of the right foot to the level of bone, bone biopsy right 2nd metatarsal  ??  Pathology: Right 2nd metatarsal bone biopsy, bone culture  ??  Anesthesia: general   ??  Hemostasis: anatomic dissection  ??  Estimated Blood loss: 10 cc  ??  Materials: 3-0 prolene  ??  Injectables: 7cc 1% lido plain/ 0.5% marcaine plain half and half mix  ??  Complications: none  ??  Description of procedure:  Pre-operative patient identification was carried out by myself, then the patient was brought to the operating room and placed on the operating table in a supine position. After adequate anesthesia was obtained the Right LE was then prepped and draped in the normal sterile fashion.  ??  An incision was made on the medial aspect of the right 2nd digit over the pipj corresponding to the MRI where a possible foreign body was noted. The wound was probed with forceps and a hemostat. No foreign bodies were identified. All areas were extensively probed without finding any foreign bodies. The wound was then flushed with normal saline and closed with 3-0 prolene.     The dorsal aspect of the right 2nd digit was then evaluated. An ulcer was noted on the dorsal PIPJ. The wound was probed with a forceps and a hemostat and all purulence drained.Marland Kitchen Approximately 1cc of purulence was drainaged.     The plantar 2nd metatarsal wound was then evaluated. The wound was explored and it was noted that it probed to the 2nd metatarsal and 2nd proxmioal phalanx base. It was noted that the 2nd MPJ was completed dislocated as the hemostat could be placed within the 2nd mpj and the bases of the 2nd proximal phalanx and 2nd met head could be probed. The  wound was fully explored. No foreign bodies were found. A jamshidi was then obtained a bone biopsy was then obtained of the 2nd metatarsal head. This was sent for pathology and microbiology.      All wounds were then milked for any additional purulence and then copiously flushed with normal saline.     Satisfied with the decompression, the plantar 2nd metatarsal wound was packed open with iodophor packing, and all wounds were then covered with betadine soaked adaptic, betadine soaked 4x5 gauze, kerlix, ace.    The patient tolerated the procedure and anesthesia well, and was transported from the operating room to the PACU with vital signs stable and with the neurovascular status of the operative foot intact.     This procedure is part of a series of staged procedures in an attempt at limb salvage.

## 2016-09-05 NOTE — Other (Signed)
TRANSFER - OUT REPORT:    Verbal report given to Van Tassellhristina, RN on Ned GraceHoward B Pozzi  being transferred to 426 for routine post - op       Report consisted of patient???s Situation, Background, Assessment and   Recommendations(SBAR).     Information from the following report(s) SBAR, OR Summary, Intake/Output, MAR, Recent Results and Cardiac Rhythm NSR was reviewed with the receiving nurse.    Lines:   Peripheral IV 09/03/16 Right Antecubital (Active)   Site Assessment Clean, dry, & intact 09/05/2016 10:33 AM   Phlebitis Assessment 0 09/05/2016 10:00 AM   Infiltration Assessment 0 09/05/2016 10:00 AM   Dressing Status Clean, dry, & intact 09/05/2016 10:00 AM   Dressing Type Transparent;Tape 09/05/2016 10:00 AM   Hub Color/Line Status Pink;Patent;Infusing 09/05/2016 10:00 AM   Action Taken Open ports on tubing capped 09/05/2016  8:44 AM   Alcohol Cap Used Yes 09/05/2016 10:00 AM        Opportunity for questions and clarification was provided.      Patient transported with:   Registered Nurse

## 2016-09-05 NOTE — Progress Notes (Addendum)
[facility]       Resident Progress Note in Brief    S: Pt was seen after his procedure. Doing well, was with his wife. Asked about the PICC line. Explained that it will depend on the bone biopsy results. No pain. Patient seen and examined at bedside.     O:  Visit Vitals   ??? BP 133/75 (BP 1 Location: Left arm, BP Patient Position: At rest)   ??? Pulse 64   ??? Temp 98.2 ??F (36.8 ??C)   ??? Resp 14   ??? Ht 6\' 2"  (1.88 m)   ??? Wt 345 lb (156.5 kg)   ??? SpO2 96%   ??? BMI 44.3 kg/m2     Physical Examination:   General appearance - alert, well appearing, and in no distress  Chest - clear to auscultation, no wheezes, rales or rhonchi, symmetric air entry  Heart - normal rate, regular rhythm, normal S1, S2, no murmurs, rubs, clicks or gallops,   Abdomen - soft, nontender, nondistended, no masses or organomegaly  Neurological - alert, oriented, normal speech, no focal findings  Extremities - Leg wrapped     A/P:   - F/u bone biopsy and podiatry recs  - TBI as ABI results show calcification pattern despite no PVD result impression  - Refer to daily progress note for further details.       Yolonda Kidaaniel S Ericca Labra, MD  Family Medicine Resident

## 2016-09-05 NOTE — Progress Notes (Signed)
Primary Nurse Shireen Quanhristina C Noss, RN and lindsey, RN performed a dual skin assessment on this patient Impairment noted- see wound doc flow sheet  Braden score is 23

## 2016-09-05 NOTE — Brief Op Note (Signed)
BRIEF OPERATIVE NOTE    Date of Procedure: 09/05/2016   Preoperative Diagnosis: CELLULITIS  Postoperative Diagnosis: CELLULITIS    Procedure(s):  INCISION AND DRAINAGERIGHT FOOT, FOREIGN BODY REMOVAL, 2ND METATARSAL BONE BIOPSY  Surgeon(s) and Role:     * Future Yeldell E Swazil[place], DPM - Primary         Surgical Assistant: None    Surgical Staff:  Circ-1: Kirke CorinShelley M Braddy, RN  Scrub Tech-1: [place] R Williams  Surg Asst-1: Maryan RuedPaul D Graves, LPN  Event Time In   Incision Start 0915   Incision Close [phone]     Anesthesia: General   Estimated Blood Loss: 10cc  Specimens:   ID Type Source Tests Collected by Time Destination   1 : 2nd Metatarsal Bone Biopsy Preservative Foot, Right  Akya Fiorello E [place], DPM 09/05/2016 [id number] Pathology   1 : 2nd Metatarsal Bone Biopsy Wound Foot, Right CULTURE, ANAEROBIC, CULTURE, WOUND W Ardis RowanGRAM STAIN, GRAM STAIN Javanna Patin E [place], DPM 09/05/2016 [id number] Microbiology      Findings: devitalized necrotic tissue, purulence 1 cc, dislocated 2nd mpj  Complications: *none  Implants: * No implants in log *

## 2016-09-05 NOTE — Anesthesia Pre-Procedure Evaluation (Signed)
Anesthetic History   No history of anesthetic complications            Review of Systems / Medical History  Patient summary reviewed, nursing notes reviewed and pertinent labs reviewed    Pulmonary  Within defined limits      Sleep apnea: CPAP           Neuro/Psych   Within defined limits           Cardiovascular  Within defined limits  Hypertension          CAD and CABG    Exercise tolerance: >4 METS     GI/Hepatic/Renal  Within defined limits       Renal disease: CRI       Endo/Other  Within defined limits      Morbid obesity     Other Findings   Comments: Neuropathy  osteomyelitis           Physical Exam    Airway  Mallampati: I    Neck ROM: normal range of motion   Mouth opening: Normal     Cardiovascular  Regular rate and rhythm,  S1 and S2 normal,  no murmur, click, rub, or gallop  Rhythm: regular  Rate: normal         Dental  No notable dental hx       Pulmonary  Breath sounds clear to auscultation               Abdominal  GI exam deferred       Other Findings            Anesthetic Plan    ASA: 3  Anesthesia type: general          Induction: Intravenous  Anesthetic plan and risks discussed with: Patient

## 2016-09-05 NOTE — Progress Notes (Signed)
[address]  [place], [place] [address]   Office [phone]  Fax [phone]          Assessment and Plan     Elijah [name] is a 66 y.o. 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 He has spent 2 night(s) in the hospital.    24 Hour Events: NAEO. Pt did fine overnight.     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 S/p 10 day augmentin and doxycycline at separate occasions respectively. MRI positive. Negative LE doppler. No PAD in both legs per read, but ABI on R ankle (1.33 - possible calcification), L ankle (1.39 - possible calcification), R toe (1.09), L toe (1.43 - possible calcification). BCx NG 14hr, WCx mod alpha strep, light GNR, and coag neg staph.   - NPO after midnight for I&D, removal of foreign body, and bone biopsy @ 9am 6/10  - Continue Vanc and cefepime day 2 per podiatry  - Podiatry following, appreciate recs  - Evalute LE perfusion LE PVR  - F/u BCx and Wound Cx  ??  HTN. poa 156/92  - Continue home Norvasc 5mg , lisinopril 20mg , bystolic 10mg    ??  Chronic peripheral neuropathy, familial   - Continue Cymbalta and Lyrica 300mg  BID  ??  HLD. TC 162, LDL 58, HDL 40, TG 322 on 6/57/84.  - Continue Lipitor 40mg    - Hold aspirin for now  ??  Obesity BMI Body mass index is 44.3 kg/(m^2).  - Encouraging lifestyle modifications and further follow up outpatient.   ??  FEN/GI - NPO after midnight  Activity - Ambulate as tolerated  DVT prophylaxis - Lovenox for tonight. Hold in the morning.  GI prophylaxis - Not indicated at this time  Fall prophylaxis - Not indicated at this time.  Disposition - Admit to Medical. Plan to d/c to Home. Consulting PT/OT  Code Status - DNR, discussed with patient / caregivers.    I appreciate the opportunity to participate in the care of this patient,  Yolonda Kida, MD  Family Medicine Resident         Subjective / Objective      Subjective Pt is well overnight other than "being interrupted from sleep". Communicated that nurses have to check on pt to make sure he is doing well as well. Denies Cp, sob, dizziness, fever/chills.     Temp (24hrs), Avg:97.9 ??F (36.6 ??C), Min:97.5 ??F (36.4 ??C), Max:98.2 ??F (36.8 ??C)     Objective:  Vital signs: (most recent): Blood pressure 151/74, pulse 69, temperature 97.8 ??F (36.6 ??C), resp. rate 16, height 6\' 2"  (1.88 m), weight 345 lb (156.5 kg), SpO2 93 %.       Visit Vitals   ??? BP 151/74 (BP 1 Location: Left arm, BP Patient Position: Sitting)   ??? Pulse 69   ??? Temp 97.8 ??F (36.6 ??C)   ??? Resp 16   ??? Ht 6\' 2"  (1.88 m)   ??? Wt 345 lb (156.5 kg)   ??? SpO2 93%   ??? BMI 44.3 kg/m2     Respiratory:   O2 Device: Room air     I/O:    Date 09/04/16 0700 - 09/05/16 0659 09/05/16 0700 - 09/06/16 0659   Shift 0700-1859 1900-0659 24 Hour Total 0700-1859 1900-0659 24 Hour Total   I  N  T  A  K  E   Shift Total  (  mL/kg)         O  U  T  P  U  T   Urine  (mL/kg/hr) 900  (0.5)  900         Urine Voided 900  900       Shift Total  (mL/kg) 900  (5.8)  900  (5.8)      NET -900  -900      Weight (kg) 156.5 156.5 156.5 156.5 156.5 156.5       Inpatient Medications  Current Facility-Administered Medications   Medication Dose Route Frequency   ??? sodium chloride (NS) flush 5-10 mL  5-10 mL IntraVENous Q8H   ??? sodium chloride (NS) flush 5-10 mL  5-10 mL IntraVENous PRN   ??? cefepime (MAXIPIME) 2 g in 0.9% sodium chloride (MBP/ADV) 100 mL  2 g IntraVENous Q8H   ??? vancomycin (VANCOCIN) 2,000 mg in 0.9% sodium chloride 500 mL IVPB  2,000 mg IntraVENous Q12H   ??? DULoxetine (CYMBALTA) capsule 60 mg  60 mg Oral DAILY   ??? atorvastatin (LIPITOR) tablet 40 mg  40 mg Oral QHS   ??? nebivolol (BYSTOLIC) tablet 10 mg  10 mg Oral QHS   ??? lisinopril (PRINIVIL, ZESTRIL) tablet 20 mg  20 mg Oral DAILY   ??? amLODIPine (NORVASC) tablet 5 mg  5 mg Oral DAILY   ??? pregabalin (LYRICA) capsule 300 mg  300 mg Oral BID         Allergies  Allergies    Allergen Reactions   ??? Metoprolol Other (comments)     Patient states, made me feel horrible.         CBC:  Recent Labs      09/04/16   0222  09/03/16   1407   WBC  6.5  7.4   HGB  13.1  13.5   HCT  39.1  39.8   PLT  238  261       Metabolic Panel:  Recent Labs      09/04/16   0222  09/03/16   1407   NA  142  139   K  3.9  4.3   CL  111*  110*   CO2  21  23   BUN  25*  26*   CREA  1.24  1.43*   GLU  88  99   CA  8.4*  8.7   ALB  2.8*   --    SGOT  18   --    ALT  21   --          Physical Exam  General:   Alert, cooperative, no acute distress   Head:   Atraumatic   Eyes:   Conjunctivae clear   ENT:  Oral mucosa normal   Neck:  Supple, trachea midline, no adenopathy   No JVD   Back:    No CVA tenderness    Lungs:   Clear to auscultation bilaterally    Heart:   Regular rate and rhythm, no murmur   Abdomen:    Soft, non-tender, ND. No masses or organomegaly    Extremities:  No edema or DVT signs. Pt has marking from quell neuropathy applicator   Skin:  Warm and dry. Ulceration on R sole (2 - 2-3cm diameter with some drainage.   Neurologic:  Oriented. Decreased sensation below half mid calf all the way to toes. No other focal deficits       Urinary catheter:  none  Imaging/procedures:    EKG:        For Billing    Chief Complaint   Patient presents with   ??? Toe Pain       Hospital Problems  Date Reviewed: 09/02/2016          Codes Class Noted POA    * (Principal)Osteomyelitis (HCC) ICD-10-CM: M86.9  ICD-9-CM: 730.20  09/03/2016 Yes        Polyneuropathy (Chronic) ICD-10-CM: G62.9  ICD-9-CM: 356.9  05/28/2016 Yes        Essential hypertension, benign (Chronic) ICD-10-CM: I10  ICD-9-CM: 401.1  02/21/2015 Yes    Overview Signed 08/10/2016  3:46 PM by Jimmey Ralphindy W Gagat, LPN     Overview:   Qualifier: Diagnosis of   By: Claiborne Billingsallahan CMA, Jacqualynn              Coronary atherosclerosis of native coronary artery (Chronic) ICD-10-CM: I25.10  ICD-9-CM: 414.01  02/21/2015 Yes    Overview Addendum 08/10/2016  3:46 PM by Jimmey Ralphindy W Gagat, LPN      S/p CABG x5. DOE was presenting complaint.

## 2016-09-05 NOTE — Op Note (Signed)
Operative Report  ??  Procedure date: 09/05/16   ??  Surgeon: Cher Franzoni [name], DPM  ??  1st Asst: none  ??  Preop diagnosis: Right foot abscess, foreign body, osteomyelitis  ??  Post op diagnosis: Right foot abscess, osteomyelitis  ??  Procedure: Complex incision and drainage of the right foot to the level of bone, bone biopsy right 2nd metatarsal  ??  Pathology: Right 2nd metatarsal bone biopsy, bone culture  ??  Anesthesia: general   ??  Hemostasis: anatomic dissection  ??  Estimated Blood loss: 10 cc  ??  Materials: 3-0 prolene  ??  Injectables: 7cc 1% lido plain/ 0.5% marcaine plain half and half mix  ??  Complications: none  ??  Description of procedure:  Pre-operative patient identification was carried out by myself, then the patient was brought to the operating room and placed on the operating table in a supine position. After adequate anesthesia was obtained the Right LE was then prepped and draped in the normal sterile fashion.  ??  An incision was made on the medial aspect of the right 2nd digit over the pipj corresponding to the MRI where a possible foreign body was noted. The wound was probed with forceps and a hemostat. No foreign bodies were identified. All areas were extensively probed without finding any foreign bodies. The wound was then flushed with normal saline and closed with 3-0 prolene.     The dorsal aspect of the right 2nd digit was then evaluated. An ulcer was noted on the dorsal PIPJ. The wound was probed with a forceps and a hemostat and all purulence drained.Marland Kitchen Approximately 1cc of purulence was drainaged.     The plantar 2nd metatarsal wound was then evaluated. The wound was explored and it was noted that it probed to the 2nd metatarsal and 2nd proxmioal phalanx base. It was noted that the 2nd MPJ was completed dislocated as the hemostat could be placed within the 2nd mpj and the bases of the 2nd proximal phalanx and 2nd met head could be probed. The wound was fully explored. No foreign bodies were  found. A jamshidi was then obtained a bone biopsy was then obtained of the 2nd metatarsal head. This was sent for pathology and microbiology.      All wounds were then milked for any additional purulence and then copiously flushed with normal saline.     Satisfied with the decompression, the plantar 2nd metatarsal wound was packed open with iodophor packing, and all wounds were then covered with betadine soaked adaptic, betadine soaked 4x5 gauze, kerlix, ace.    The patient tolerated the procedure and anesthesia well, and was transported from the operating room to the PACU with vital signs stable and with the neurovascular status of the operative foot intact.     This procedure is part of a series of staged procedures in an attempt at limb salvage.

## 2016-09-06 ENCOUNTER — Inpatient Hospital Stay

## 2016-09-06 ENCOUNTER — Inpatient Hospital Stay: Admit: 2016-09-06 | Payer: MEDICARE | Primary: Family Medicine

## 2016-09-06 LAB — METABOLIC PANEL, COMPREHENSIVE
A-G Ratio: 0.7 — ABNORMAL LOW (ref 1.1–2.2)
ALT (SGPT): 21 U/L (ref 12–78)
AST (SGOT): 16 U/L (ref 15–37)
Albumin: 2.5 g/dL — ABNORMAL LOW (ref 3.5–5.0)
Alk. phosphatase: 65 U/L (ref 45–117)
Anion gap: 6 mmol/L (ref 5–15)
BUN/Creatinine ratio: 15 (ref 12–20)
BUN: 17 MG/DL (ref 6–20)
Bilirubin, total: 0.4 MG/DL (ref 0.2–1.0)
CO2: 22 mmol/L (ref 21–32)
Calcium: 8.1 MG/DL — ABNORMAL LOW (ref 8.5–10.1)
Chloride: 113 mmol/L — ABNORMAL HIGH (ref 97–108)
Creatinine: 1.12 MG/DL (ref 0.70–1.30)
GFR est AA: >60 mL/min/{1.73_m2} (ref >60)
GFR est non-AA: >60 mL/min/{1.73_m2} (ref >60)
Globulin: 3.7 g/dL (ref 2.0–4.0)
Glucose: 91 mg/dL (ref 65–100)
Potassium: 4 mmol/L (ref 3.5–5.1)
Protein, total: 6.2 g/dL — ABNORMAL LOW (ref 6.4–8.2)
Sodium: 141 mmol/L (ref 136–145)

## 2016-09-06 LAB — CBC WITH AUTOMATED DIFF
ABS. BASOPHILS: 0.1 10*3/uL (ref 0.0–0.1)
ABS. EOSINOPHILS: 0.2 10*3/uL (ref 0.0–0.4)
ABS. IMM. GRANS.: 0 10*3/uL (ref 0.00–0.04)
ABS. LYMPHOCYTES: 1.4 10*3/uL (ref 0.8–3.5)
ABS. MONOCYTES: 0.8 10*3/uL (ref 0.0–1.0)
ABS. NEUTROPHILS: 4.2 10*3/uL (ref 1.8–8.0)
ABSOLUTE NRBC: 0 10*3/uL (ref 0.00–0.01)
BASOPHILS: 1 % (ref 0–1)
EOSINOPHILS: 3 % (ref 0–7)
HCT: 38.6 % (ref 36.6–50.3)
HGB: 12.7 g/dL (ref 12.1–17.0)
IMMATURE GRANULOCYTES: 0 % (ref 0.0–0.5)
LYMPHOCYTES: 21 % (ref 12–49)
MCH: 31.1 PG (ref 26.0–34.0)
MCHC: 32.9 g/dL (ref 30.0–36.5)
MCV: 94.4 FL (ref 80.0–99.0)
MONOCYTES: 12 % (ref 5–13)
MPV: 10.2 FL (ref 8.9–12.9)
NEUTROPHILS: 63 % (ref 32–75)
NRBC: 0 PER 100 WBC
PLATELET: 227 10*3/uL (ref 150–400)
RBC: 4.09 M/uL — ABNORMAL LOW (ref 4.10–5.70)
RDW: 14.2 % (ref 11.5–14.5)
WBC: 6.7 10*3/uL (ref 4.1–11.1)

## 2016-09-06 LAB — VANCOMYCIN, TROUGH
Reported dose date: 20180610
Reported dose time:: 900
Reported dose:: 2000 UNITS
Vancomycin,trough: 19.2 ug/mL — ABNORMAL HIGH (ref 5.0–10.0)

## 2016-09-06 LAB — C REACTIVE PROTEIN, QT: C-Reactive protein: 2.79 mg/dL — ABNORMAL HIGH (ref <0.60)

## 2016-09-06 MED ORDER — SEVOFLURANE FOR INHALATION
RESPIRATORY_TRACT | Status: AC
Start: 2016-09-06 — End: ?

## 2016-09-06 MED ORDER — DEXAMETHASONE SODIUM PHOSPHATE 4 MG/ML IJ SOLN
4 mg/mL | INTRAMUSCULAR | Status: AC
Start: 2016-09-06 — End: ?

## 2016-09-06 MED ORDER — LIDOCAINE (PF) 20 MG/ML (2 %) IV SYRINGE
100 mg/5 mL (2 %) | INTRAVENOUS | Status: AC
Start: 2016-09-06 — End: ?

## 2016-09-06 MED ORDER — PROPOFOL 10 MG/ML IV EMUL
10 mg/mL | INTRAVENOUS | Status: AC
Start: 2016-09-06 — End: ?

## 2016-09-06 MED ORDER — CEFAZOLIN 1 GRAM SOLUTION FOR INJECTION
1 gram | INTRAMUSCULAR | Status: AC
Start: 2016-09-06 — End: ?

## 2016-09-06 MED ORDER — ONDANSETRON (PF) 4 MG/2 ML INJECTION
4 mg/2 mL | INTRAMUSCULAR | Status: AC
Start: 2016-09-06 — End: ?

## 2016-09-06 MED ORDER — WHITE PETROLATUM-MINERAL OIL 94 %-3 % EYE OINTMENT
94-3 % | OPHTHALMIC | Status: AC
Start: 2016-09-06 — End: ?

## 2016-09-06 MED ORDER — SODIUM CHLORIDE 0.9 % IJ SYRG
Freq: Three times a day (TID) | INTRAMUSCULAR | Status: DC
Start: 2016-09-06 — End: 2016-09-06
  Administered 2016-09-06: 22:00:00 via INTRAVENOUS

## 2016-09-06 MED ORDER — SODIUM CHLORIDE 0.9 % IJ SYRG
INTRAMUSCULAR | Status: DC | PRN
Start: 2016-09-06 — End: 2016-09-09

## 2016-09-06 MED ORDER — ROCURONIUM 10 MG/ML IV
10 mg/mL | INTRAVENOUS | Status: AC
Start: 2016-09-06 — End: ?

## 2016-09-06 MED FILL — ENOXAPARIN 40 MG/0.4 ML SUB-Q SYRINGE: 40 mg/0.4 mL | SUBCUTANEOUS | Qty: 0.4

## 2016-09-06 MED FILL — ULTANE INHALATION LIQUID: RESPIRATORY_TRACT | Qty: 250

## 2016-09-06 MED FILL — BD POSIFLUSH NORMAL SALINE 0.9 % INJECTION SYRINGE: INTRAMUSCULAR | Qty: 10

## 2016-09-06 MED FILL — ATORVASTATIN 20 MG TAB: 20 mg | ORAL | Qty: 2

## 2016-09-06 MED FILL — LYRICA 75 MG CAPSULE: 75 mg | ORAL | Qty: 4

## 2016-09-06 MED FILL — DEXAMETHASONE SODIUM PHOSPHATE 4 MG/ML IJ SOLN: 4 mg/mL | INTRAMUSCULAR | Qty: 1

## 2016-09-06 MED FILL — VANCOMYCIN 10 GRAM IV SOLR: 10 gram | INTRAVENOUS | Qty: 2000

## 2016-09-06 MED FILL — DIPRIVAN 10 MG/ML INTRAVENOUS EMULSION: 10 mg/mL | INTRAVENOUS | Qty: 60

## 2016-09-06 MED FILL — AMLODIPINE 5 MG TAB: 5 mg | ORAL | Qty: 1

## 2016-09-06 MED FILL — CEFEPIME 2 GRAM SOLUTION FOR INJECTION: 2 gram | INTRAMUSCULAR | Qty: 2

## 2016-09-06 MED FILL — ONDANSETRON (PF) 4 MG/2 ML INJECTION: 4 mg/2 mL | INTRAMUSCULAR | Qty: 2

## 2016-09-06 MED FILL — DULOXETINE 30 MG CAP, DELAYED RELEASE: 30 mg | ORAL | Qty: 2

## 2016-09-06 MED FILL — LISINOPRIL 20 MG TAB: 20 mg | ORAL | Qty: 1

## 2016-09-06 MED FILL — ROCURONIUM 10 MG/ML IV: 10 mg/mL | INTRAVENOUS | Qty: 5

## 2016-09-06 MED FILL — LIDOCAINE (PF) 20 MG/ML (2 %) IV SYRINGE: 100 mg/5 mL (2 %) | INTRAVENOUS | Qty: 5

## 2016-09-06 MED FILL — SYSTANE NIGHTTIME 94 %-3 % EYE OINTMENT: 94-3 % | OPHTHALMIC | Qty: 3.5

## 2016-09-06 MED FILL — BYSTOLIC 5 MG TABLET: 5 mg | ORAL | Qty: 2

## 2016-09-06 MED FILL — CEFAZOLIN 1 GRAM SOLUTION FOR INJECTION: 1 gram | INTRAMUSCULAR | Qty: 2000

## 2016-09-06 NOTE — Progress Notes (Signed)
Bedside and Verbal shift change report given to Christina (oncoming nurse) by Jonathan (offgoing nurse). Report included the following information SBAR, Kardex, Intake/Output, MAR and Recent Results.

## 2016-09-06 NOTE — Progress Notes (Signed)
[address]  [place], [place] [id number]   Office [phone]  Fax [phone]          Assessment and Plan     Elijah GraceHoward B [name] is a 66 y.o. 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 He has spent 3 night(s) in the hospital.    24 Hour Events: NAEO. Pt did fine overnight.     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 S/p 10 day augmentin and doxycycline at separate occasions respectively. MRI positive. Negative LE doppler. No PAD in both legs per read, but ABI on R ankle (1.33 - possible calcification), L ankle (1.39 - possible calcification), R toe (1.09), L toe (1.43 - possible calcification). TBI with similar index. BCx NG 2d, WCx mod alpha strep, light GNR, and coag neg staph. Bone biopsy Cx gram stain w/o organism.  - Continue Vanc and cefepime day 3 per podiatry  - Podiatry following, appreciate recs  - F/u Wound Cx and bone biopsy  ??  HTN. poa 156/92  - Continue home Norvasc 5mg , lisinopril 20mg , bystolic 10mg    ??  Chronic peripheral neuropathy, familial   - Continue Cymbalta and Lyrica 300mg  BID  ??  HLD. TC 162, LDL 58, HDL 40, TG 322 on 1/30/863/27/18.  - Continue Lipitor 40mg    - Hold aspirin for now  ??  Obesity BMI Body mass index is 44.3 kg/(m^2).  - Encouraging lifestyle modifications and further follow up outpatient.   ??  FEN/GI - Regular diet per pt request  Activity - Ambulate as tolerated  DVT prophylaxis - Lovenox for tonight. Hold in the morning.  GI prophylaxis - Not indicated at this time  Fall prophylaxis - Not indicated at this time.  Disposition - Admit to Medical. Plan to d/c to Home. Consulting PT/OT  Code Status - DNR, discussed with patient / caregivers.    I appreciate the opportunity to participate in the care of this patient,  Yolonda Kidaaniel S Zachery Niswander, MD  Family Medicine Resident         Subjective / Objective      Subjective Pt is well overnight. Denies Cp, sob, dizziness, fever/chills.     Temp (24hrs), Avg:98.3 ??F (36.8 ??C), Min:97.9 ??F (36.6 ??C), Max:99.4 ??F (37.4 ??C)     Objective:  Vital signs: (most recent): Blood pressure 142/87, pulse 61, temperature 97.9 ??F (36.6 ??C), resp. rate 16, height 6\' 2"  (1.88 m), weight 345 lb (156.5 kg), SpO2 96 %.       Visit Vitals   ??? BP 140/84 (BP 1 Location: Left arm, BP Patient Position: At rest)   ??? Pulse (!) 58   ??? Temp 98.9 ??F (37.2 ??C)   ??? Resp 16   ??? Ht 6\' 2"  (1.88 m)   ??? Wt 345 lb (156.5 kg)   ??? SpO2 97%   ??? BMI 44.3 kg/m2     Respiratory: O2 Flow Rate (L/min): 2 l/min O2 Device: CPAP nasal     I/O:    Date 09/05/16 0700 - 09/06/16 0659 09/06/16 0700 - 09/07/16 0659   Shift 0700-1859 1900-0659 24 Hour Total 0700-1859 1900-0659 24 Hour Total   I  N  T  A  K  E   I.V.  (mL/kg/hr) 550  (0.3) 1400  (0.7) 1950  (0.5)  I.V. 50  50         Volume (lactated Ringers infusion) 500  500         Volume (cefepime (MAXIPIME) 2 g in 0.9% sodium chloride (MBP/ADV) 100 mL)  400 400         Volume (vancomycin (VANCOCIN) 2,000 mg in 0.9% sodium chloride 500 mL IVPB)  1000 1000       Shift Total  (mL/kg) 550  (3.5) 1400  (8.9) 1950  (12.5)      O  U  T  P  U  T   Urine  (mL/kg/hr) 1300  (0.7) 950  (0.5) 2250  (0.6)         Urine Voided 1300 950 2250       Shift Total  (mL/kg) 1300  (8.3) 950  (6.1) 2250  (14.4)      NET -750 450 -300      Weight (kg) 156.5 156.5 156.5 156.5 156.5 156.5       Inpatient Medications  Current Facility-Administered Medications   Medication Dose Route Frequency   ??? enoxaparin (LOVENOX) injection 40 mg  40 mg SubCUTAneous Q12H   ??? sodium chloride (NS) flush 5-10 mL  5-10 mL IntraVENous Q8H   ??? sodium chloride (NS) flush 5-10 mL  5-10 mL IntraVENous PRN   ??? cefepime (MAXIPIME) 2 g in 0.9% sodium chloride (MBP/ADV) 100 mL  2 g IntraVENous Q8H   ??? vancomycin (VANCOCIN) 2,000 mg in 0.9% sodium chloride 500 mL IVPB  2,000 mg IntraVENous Q12H    ??? DULoxetine (CYMBALTA) capsule 60 mg  60 mg Oral DAILY   ??? atorvastatin (LIPITOR) tablet 40 mg  40 mg Oral QHS   ??? nebivolol (BYSTOLIC) tablet 10 mg  10 mg Oral QHS   ??? lisinopril (PRINIVIL, ZESTRIL) tablet 20 mg  20 mg Oral DAILY   ??? amLODIPine (NORVASC) tablet 5 mg  5 mg Oral DAILY   ??? pregabalin (LYRICA) capsule 300 mg  300 mg Oral BID         Allergies  Allergies   Allergen Reactions   ??? Metoprolol Other (comments)     Patient states, made me feel horrible.         CBC:  Recent Labs      09/06/16   0513  09/05/16   0550  09/04/16   0222   WBC  6.7  6.1  6.5   HGB  12.7  13.0  13.1   HCT  38.6  40.2  39.1   PLT  227  242  238       Metabolic Panel:  Recent Labs      09/06/16   0513  09/05/16   0550  09/04/16   0222   NA  141  142  142   K  4.0  4.0  3.9   CL  113*  113*  111*   CO2  22  21  21    BUN  17  17  25*   CREA  1.12  1.17  1.24   GLU  91  90  88   CA  8.1*  8.5  8.4*   ALB  2.5*  2.7*  2.8*   SGOT  16  18  18    ALT  21  24  21          Physical Exam  General:   Alert, cooperative, no acute distress   Head:   Atraumatic   Eyes:  Conjunctivae clear   ENT:  Oral mucosa normal   Neck:  Supple, trachea midline, no adenopathy   No JVD   Back:    No CVA tenderness    Lungs:   Clear to auscultation bilaterally    Heart:   Regular rate and rhythm, no murmur   Abdomen:    Soft, non-tender, ND. No masses or organomegaly    Extremities:  No edema or DVT signs. Pt has marking from quell neuropathy applicator   Skin:  Warm and dry. Ulceration on R sole (2 - 2-3cm diameter with some drainage.   Neurologic:  Oriented. Decreased sensation below half mid calf all the way to toes. No other focal deficits       Urinary catheter:  none     Imaging/procedures:    EKG:        For Billing    Chief Complaint   Patient presents with   ??? Toe Pain       Hospital Problems  Date Reviewed: 25-Sep-2016          Codes Class Noted POA    * (Principal)Osteomyelitis (HCC) ICD-10-CM: M86.9  ICD-9-CM: 730.20  09/03/2016 Yes         Polyneuropathy (Chronic) ICD-10-CM: G62.9  ICD-9-CM: 356.9  05/28/2016 Yes        Essential hypertension, benign (Chronic) ICD-10-CM: I10  ICD-9-CM: 401.1  02/21/2015 Yes    Overview Signed 08/10/2016  3:46 PM by Jimmey Ralph, LPN     Overview:   Qualifier: Diagnosis of   By: Claiborne Billings CMA, Jacqualynn              Coronary atherosclerosis of native coronary artery (Chronic) ICD-10-CM: I25.10  ICD-9-CM: 414.01  02/21/2015 Yes    Overview Addendum 08/10/2016  3:46 PM by Jimmey Ralph, LPN     S/p CABG x5. DOE was presenting complaint.

## 2016-09-06 NOTE — Progress Notes (Signed)
09/06/2016 12:24 PM Met with pt and pt's wife, Leda Gauze. Charted address and phone number confirmed. Pt lives with his wife in [place], [place]. Pt's address in [place] is [address] [place] [address].   Reason for Admission:  Osteomyelitis                     RRAT Score: 10                   Plan for utilizing home health: tbd, no history                         Likelihood of Readmission: low                          Transition of Care Plan: home with wife, unsure if home health will be needed at this time    Pt will be staying at his house in [place] immediately after discharge, there are 4 steps to enter and 1 flight of steps to his bedroom. Pt has a cpap at home. Pt has rx coverage and fills his scripts at [facility] in [place]. Pt's wife will transport pt home.  CM discussed options at discharge, home health vs home iv infusion. Pt reported he has been told he might need a picc at discharge.   CM will follow up once discharge recommendations have been made.  Evonnie Dawes, BSW  Care Management Interventions  PCP Verified by CM: Yes Charm Barges nurse navigator was notified of admission)  Last Visit to PCP: 09/02/16  MyChart Signup: No  Discharge Durable Medical Equipment: No  Physical Therapy Consult: Yes  Occupational Therapy Consult: Yes  Speech Therapy Consult: No  Current Support Network: Lives with Spouse, Own Home  Confirm Follow Up Transport: Family  Plan discussed with Pt/Family/Caregiver: Yes

## 2016-09-06 NOTE — Procedures (Signed)
[facility]  *** FINAL REPORT ***    Name: Elijah [name], Elijah [name]  MRN: [id number]    Outpatient  DOB: 21 Dec 1950  HIS Order #: [id number]  TRAKnet Visit #: [id number]  Date: 03 Sep 2016    TYPE OF TEST: Peripheral Venous Testing    REASON FOR TEST  Limb swelling    Right Leg:-  Deep venous thrombosis:           No  Superficial venous thrombosis:    No  Deep venous insufficiency:        No  Superficial venous insufficiency: No      INTERPRETATION/FINDINGS  PROCEDURE:  RIGHT LOWER EXTREMITY  VENOUS DUPLEX. Evaluation of lower  extremity veins with ultrasound (B-mode imaging, pulsed Doppler, color   Doppler).  Includes the common femoral, deep femoral, femoral,  popliteal, posterior tibial, peroneal, and great saphenous veins.  Other veins, for example the gastrocnemius and soleal veins, may also  be visualized.    FINDINGS: Unable to fully evaluate the paired right posterior tibial  or peroneal veins due to swelling secondary to patient body  habitus.Gray scale and color flow duplex images of the remaining  veins in the right lower extremity demonstrate normal compressibility,   spontaneous and augmented flow profiles, and absence of filling  defects throughout the deep and superficial veins in the right lower  extremity.    CONCLUSION: Technically difficult study due to patient body  habitus.Right lower extremity venous duplex negative for deep venous  thrombosis or thrombophlebitis in the veins visualized. Left common  femoral vein is thrombus free. NOTES: Enlarged lymphnode noted within  the right groin measuring 2.57 cm x 1.45 cm.    ADDITIONAL COMMENTS    I have personally reviewed the data relevant to the interpretation of  this  study.    TECHNOLOGIST: Jasmine Smith, RVT  Signed: 09/03/2016 03:25 PM    PHYSICIAN: Denver Harder T. Tiphanie Vo, MD  Signed: 09/06/2016 06:02 AM

## 2016-09-06 NOTE — Procedures (Signed)
[facility]  *** FINAL REPORT ***    Name: [name], Elijah  MRN: [id number]  DOB: 21 Dec 1950  HIS Order #: [id number]  TRAKnet Visit #: [id number]  Date: 04 Sep 2016    TYPE OF TEST: Peripheral Arterial Testing    REASON FOR TEST  Peripheral vascular dz NOS    Right Leg  Segmentals: Normal                     mmHg  Brachial         117  High thigh  Low thigh  Calf  Posterior tibial 156  Dorsalis pedis   146  Peroneal  Metatarsal  Toe pressure     128  Doppler:    Normal  PVR:        Normal  Ankle/Brachial: 1.33    Left Leg  Segmentals: Normal                     mmHg  Brachial         110  High thigh  Low thigh  Calf  Posterior tibial 163  Dorsalis pedis   156  Peroneal  Metatarsal  Toe pressure     167  Doppler:    Normal  PVR:        Normal  Ankle/Brachial: 1.39    INTERPRETATION/FINDINGS  PROCEDURE:  Evaluation of lower extremity arteries with systolic blood   pressure measurement at the ankle and brachial level and calculation  of the ankle/brachial pressure index (ABI).  The exam may also include   pulse volume recording (PVR) plethysmography at the ankle level.    CONCLUSION:  1. No evidence of significant peripheral arterial disease at rest in  the right leg.  2. No evidence of significant peripheral arterial disease at rest in  the left leg.  3. The right ankle/brachial index is 1.33 and the left ankle/brachial  index is 1.39.  4. The right toe/brachial index is 1.09 and the left toe/brachial  index is 1.43.    ADDITIONAL COMMENTS    I have personally reviewed the data relevant to the interpretation of  this  study.    TECHNOLOGIST: Jill Lewis-Sunshine, RVS  Signed: 09/04/2016 12:23 PM    PHYSICIAN: Arien Morine T. Mavery Milling, MD  Signed: 09/06/2016 06:00 AM

## 2016-09-06 NOTE — Progress Notes (Signed)
Bedside and Verbal shift change report given to Donna (oncoming nurse) by Ellie (offgoing nurse). Report included the following information SBAR, Kardex and Recent Results.

## 2016-09-06 NOTE — Consults (Addendum)
Francoise Schaumann, DPM - Greig Castilla K. Posey Pronto, DPM - Dontrelle Mazon E. [name], DPM                                                 Podiatric Surgery Consultation  Assessment/Plan:  Elijah [name] is a 28M who presents with a right foot abscess and possible OM of the right 2nd metatarsal, s/p 09/05/16 right foot incision and drainage and right 2nd met bone biopsy    - Evaluated and treated all patient concerns  - Mr. Zinn is against amputation until all other treatment options have been pursued  - Discussed HBOT. Will order chest xray and lab work.   - Follow up bone cultures and pathology  - Pnt will likely need PICC/6 weeks of IV abx  - Right 2nd toe betadine wet to dry, plantar 2nd met plain sterile packing, betadine W2D, kerlix, ace  - Left foot blister. Betadine/Mepilex  - Pt can f/u with [place] at the [facility] [phone], or at our private office.  - Thank you for this consultation.  Please do not hesitate to call with any questions.    Subjective:  Patient doing well postop. No new issues. Denies f/c/n/v, chest pain, sob, dyspnea    HPI:  Elijah [name] is a 32M who presents with a right foot wound. He has been following with his family medicine doctor. He initially stepped on a broken light fixture. He stated that those wounds on the bottom of his foot never healed. He was put on po abx. He continued to have a red swollen foot. The FM doctor ordered a MRI and it noted possible om of the 2nd digit proximal phalanx, possible septic arthritis of the 2nd mtpj, and possibly a fb of the plantar 2nd proximal phalanx head.     History:  Osteomyelitis (HCC)  CELLULITIS  Allergies   Allergen Reactions   ??? Metoprolol Other (comments)     Patient states, made me feel horrible.     Family History   Problem Relation Age of Onset   ??? Stroke Mother    ??? Other Father      mrsa infxn caused death   ??? Heart Attack Brother       Past Medical History:   Diagnosis Date    ??? BPH (benign prostatic hyperplasia)    ??? CAD (coronary artery disease)    ??? HLD (hyperlipidemia)    ??? HTN (hypertension)    ??? Morbid obesity due to excess calories ([place]) 04/24/2015   ??? OSA (obstructive sleep apnea)    ??? Polyneuropathy    ??? Tubular adenoma of colon     x2 on colonoscopy 09/18/15     Past Surgical History:   Procedure Laterality Date   ??? HX HIP REPLACEMENT Bilateral      Social History   Substance Use Topics   ??? Smoking status: Former Smoker     Types: Cigarettes   ??? Smokeless tobacco: Never Used   ??? Alcohol use No      Comment: recently has stopped       History   Alcohol Use No     Comment: recently has stopped     History   Drug Use No      History   Smoking Status   ??? Former Smoker   ??? Types: Cigarettes  Smokeless Tobacco   ??? Never Used     Current Facility-Administered Medications   Medication Dose Route Frequency   ??? sodium chloride (NS) flush 5-10 mL  5-10 mL IntraVENous Q8H   ??? sodium chloride (NS) flush 5-10 mL  5-10 mL IntraVENous PRN   ??? enoxaparin (LOVENOX) injection 40 mg  40 mg SubCUTAneous Q12H   ??? sodium chloride (NS) flush 5-10 mL  5-10 mL IntraVENous Q8H   ??? sodium chloride (NS) flush 5-10 mL  5-10 mL IntraVENous PRN   ??? cefepime (MAXIPIME) 2 g in 0.9% sodium chloride (MBP/ADV) 100 mL  2 g IntraVENous Q8H   ??? vancomycin (VANCOCIN) 2,000 mg in 0.9% sodium chloride 500 mL IVPB  2,000 mg IntraVENous Q12H   ??? DULoxetine (CYMBALTA) capsule 60 mg  60 mg Oral DAILY   ??? atorvastatin (LIPITOR) tablet 40 mg  40 mg Oral QHS   ??? nebivolol (BYSTOLIC) tablet 10 mg  10 mg Oral QHS   ??? lisinopril (PRINIVIL, ZESTRIL) tablet 20 mg  20 mg Oral DAILY   ??? amLODIPine (NORVASC) tablet 5 mg  5 mg Oral DAILY   ??? pregabalin (LYRICA) capsule 300 mg  300 mg Oral BID        Objective:  Vitals:   Patient Vitals for the past 12 hrs:   BP Temp Pulse Resp SpO2   09/06/16 1540 143/67 99.2 ??F (37.3 ??C) 63 16 98 %   09/06/16 1152 143/68 98.6 ??F (37 ??C) 64 16 97 %    09/06/16 0741 140/84 98.9 ??F (37.2 ??C) (!) 58 16 97 %       Vascular:  Right DP 0/4; PT 0/4  Left DP 1/4; PT 1/4  Capillary fill time <2 seconds  Edema pitting to the right foot. No edema to the left foot  Skin temperature is warm ont eh right foot  Varicosities are absent  Bilateral legs with no evidence of hemosiderin deposits    Dermatological:  Nails are thickened, elongated, discolored, painful to palpation, 62m thick, with subungual debris.   Skin is dry and scaly   No evidence of tinea pedis  No hyperkeratotic lesions     Wound #1  Location: rt plantar 2nd met wound  Etiology: diabetic  Size: see iHeal notes  Margins: hyperkeratotic  Drainage: serous  Odor: yes  Wound base: devitalized tissue  Depth: probes to bone  Probes to bone? yes  Undermining? no  Sinus tracts? possibly  Fluctuance/Subcutaneous crepitus? no   Ascending cellulitis/Lymphangitic streaking? Yes, less redness today    Wound #2  Location: rt plantar medial 1st met  Etiology: diabetic  Size: see iHeal notes  Margins: hyperkeratotic  Drainage: none  Odor: no  Wound base: sc fat  Depth: sc fat  Probes to bone? no  Undermining? no  Sinus tracts? no  Fluctuance/Subcutaneous crepitus? no   Ascending cellulitis/Lymphangitic streaking? No    Wound #3  Location: rt dorsal 2nd pipj  Etiology: diabetic  Size: see iHeal notes  Margins: hyperkeratotic  Drainage: serous  Odor: noe  Wound base: sc fat  Depth: sc fat  Probes to bone? no  Undermining? no  Sinus tracts? no  Fluctuance/Subcutaneous crepitus? no   Ascending cellulitis/Lymphangitic streaking? No  Less erythema today    Wound #4  Location: left lateral foot blister. Roof intact. No drainage, periwound erythema, edema, crepitus/fluctuance.     Neurological:  Protective sensation per 5.07 Semmes Weinstein monofilament is Rt 0/10 and Lt 0/10  Vibratory sensation at the  Rt 1st MPJ reduced/ LT 1st MPJ reduced  Epicritic sensation is intact.  Patient is AAOx3, mood is normal.       Orthopedic:   B/L LE are symmetric  ROM of ankle, STJ, 1st MTPJ is limited  MMT 5 out of 5 for B/L LE.     No pedal amputations noted.      Constitutional: Pt is a 50M obese caucasian.     Lymphatics: negative tenderness to palpation of neck/axillary/inguinal nodes.        Imaging / Cx / Px:  09/03/16 MRI Rt w/o Contrast  IMPRESSION:   1. Second MTP joint dorsal subluxation and evidence of septic arthritis with  likely osteomyelitis of the second metatarsal head and second proximal phalanx.  If there is no clinical signs of infection, findings could represent an  inflammatory arthritis. Consider fluid sampling from the second MTP joint or  second proximal phalanx periosteum.  2. Great toe osteoarthritis and reactive edema. Osteomyelitis is less likely in  the first ray.  3. 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  4. Skin ulcerations. Cellulitis and myositis. No evidence of drainable abscess  within the limitation of motion artifact and no intravenous contrast.  EPIC email sent to Dr. Donalda Ewings at the time of the dictation.    Addendum:   ??  I discussed the results with Dr. Donalda Ewings at 9:30 AM on 09/03/2016. After reviewing  the images again, there may be tiny foreign bodies in the periosteal fluid of  the second toe at the distal aspect of the second proximal phalanx. Otherwise,  no change in findings or impression.      Labs:  Recent Labs      09/06/16   0513   WBC  6.7   CRP  2.79*   CREA  1.12   BUN  17   GLU  91   HGB  12.7   HCT  38.6   NA  141   K  4.0   CL  113*   CO2  22

## 2016-09-06 NOTE — Procedures (Signed)
[facility]  *** FINAL REPORT ***    Name: Elijah [name], Elijah [name]  MRN: [id number]  DOB: 21 Dec 1950  HIS Order #: [id number]  TRAKnet Visit #: [id number]  Date: 04 Sep 2016    TYPE OF TEST: Peripheral Arterial Testing    REASON FOR TEST  Peripheral vascular dz NOS    Right Leg  Segmentals: Normal                     mmHg  Brachial         117  High thigh  Low thigh  Calf  Posterior tibial 156  Dorsalis pedis   146  Peroneal  Metatarsal  Toe pressure     128  Doppler:    Normal  PVR:        Normal  Ankle/Brachial: 1.33    Left Leg  Segmentals: Normal                     mmHg  Brachial         110  High thigh  Low thigh  Calf  Posterior tibial 163  Dorsalis pedis   156  Peroneal  Metatarsal  Toe pressure     167  Doppler:    Normal  PVR:        Normal  Ankle/Brachial: 1.39    INTERPRETATION/FINDINGS  PROCEDURE:  Evaluation of lower extremity arteries with systolic blood   pressure measurement at the ankle and brachial level and calculation  of the ankle/brachial pressure index (ABI).  The exam may also include   pulse volume recording (PVR) plethysmography at the ankle level.    CONCLUSION:  1. No evidence of significant peripheral arterial disease at rest in  the right leg.  2. No evidence of significant peripheral arterial disease at rest in  the left leg.  3. The right ankle/brachial index is 1.33 and the left ankle/brachial  index is 1.39.  4. The right toe/brachial index is 1.09 and the left toe/brachial  index is 1.43.    ADDITIONAL COMMENTS    I have personally reviewed the data relevant to the interpretation of  this  study.    TECHNOLOGIST: Valentina LucksJill Lewis-Sunshine, RVS  Signed: 09/04/2016 12:23 PM    PHYSICIAN: Vernia BuffMarc T. Sheliah HatchWarner, MD  Signed: 09/06/2016 06:00 AM

## 2016-09-06 NOTE — Consults (Signed)
Consults by [name],  Mikka Kissner E at 09/06/16 1917                Author: [name], [name]  Service: Podiatry  Author Type: Physician       Filed: 09/06/16 1942  Date of Service: 09/06/16 1917  Status: Addendum          Editor: [name], Hamza Empson E          Related Notes: Original Note by [name], Celise Bazar E filed at 09/06/16 1928                                                                 Francoise Schaumann, DPM - Despina Pole. Posey Pronto, DPM - Avri Paiva E. [name], DPM                                                   Podiatric Surgery Consultation   Assessment/Plan:   Mr. 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 Claytor is against amputation until all other treatment options have been pursued   - Discussed HBOT. Will order chest xray and lab work.    - Follow up bone cultures and pathology   - Pnt will likely need PICC/6 weeks of IV abx   - Right 2nd toe betadine wet to dry, plantar 2nd met plain sterile packing, betadine W2D, kerlix, ace   - Left foot blister. Betadine/Mepilex   - Pt can f/u with [place] at the [facility] [phone], or at our private office.   - Thank you for this consultation.  Please do not hesitate to call with any questions.      Subjective:   Patient doing well postop. No new issues. Denies f/c/n/v, chest pain, sob, dyspnea      HPI:   Elijah [name] is a 30M who presents with a right foot wound. He has been following with his family medicine doctor. He initially stepped on a broken light fixture. He stated that those wounds on the bottom  of his foot never healed. He was put on po abx. He continued to have a red swollen foot. The FM doctor ordered a MRI and it noted possible om of the 2nd digit proximal phalanx, possible septic arthritis of the 2nd mtpj, and possibly a fb of the plantar  2nd proximal  phalanx head.       History:   Osteomyelitis (HCC)   CELLULITIS     Allergies        Allergen  Reactions         ?  Metoprolol  Other (comments)             Patient states, made me feel horrible.          Family History         Problem  Relation  Age of Onset          ?  Stroke  Mother       ?  Other  Father               mrsa infxn caused death          ?  Heart Attack  Brother             Past Medical History:        Diagnosis  Date         ?  BPH (benign prostatic hyperplasia)       ?  CAD (coronary artery disease)       ?  HLD (hyperlipidemia)       ?  HTN (hypertension)       ?  Morbid obesity due to excess calories ([place])  04/24/2015     ?  OSA (obstructive sleep apnea)       ?  Polyneuropathy       ?  Tubular adenoma of colon            x2 on colonoscopy 09/18/15          Past Surgical History:         Procedure  Laterality  Date          ?  HX HIP REPLACEMENT  Bilateral            Social History       Substance Use Topics         ?  Smoking status:  Former Smoker              Types:  Cigarettes         ?  Smokeless tobacco:  Never Used     ?  Alcohol use  No                Comment: recently has stopped            History        Alcohol Use  No             Comment: recently has stopped          History        Drug Use  No           History       Smoking Status        ?  Former Smoker         ?  Types:  Cigarettes       Smokeless Tobacco        ?  Never Used          Current Facility-Administered Medications          Medication  Dose  Route  Frequency           ?  sodium chloride (NS) flush 5-10 mL   5-10 mL  IntraVENous  Q8H     ?  sodium chloride (NS) flush 5-10 mL   5-10 mL  IntraVENous  PRN     ?  enoxaparin (LOVENOX) injection 40 mg   40 mg  SubCUTAneous  Q12H     ?  sodium chloride (NS) flush 5-10 mL   5-10 mL  IntraVENous  Q8H     ?  sodium chloride (NS) flush 5-10 mL   5-10 mL  IntraVENous  PRN     ?  cefepime (MAXIPIME) 2 g in 0.9% sodium chloride (MBP/ADV) 100 mL   2 g  IntraVENous  Q8H     ?  vancomycin (VANCOCIN) 2,000 mg in 0.9% sodium chloride 500 mL IVPB   2,000 mg  IntraVENous  Q12H           ?  DULoxetine (CYMBALTA) capsule 60 mg   60 mg  Oral  DAILY           ?  atorvastatin (LIPITOR) tablet 40 mg   40 mg  Oral  QHS     ?  nebivolol (BYSTOLIC) tablet 10 mg   10 mg  Oral  QHS     ?  lisinopril (PRINIVIL, ZESTRIL) tablet 20 mg   20 mg  Oral  DAILY     ?  amLODIPine (NORVASC) tablet 5 mg   5 mg  Oral  DAILY           ?  pregabalin (LYRICA) capsule 300 mg   300 mg  Oral  BID            Objective:   Vitals:    Patient Vitals for the past 12 hrs:            BP  Temp  Pulse  Resp  SpO2     09/06/16 1540  143/67  99.2 ??F (37.3 ??C)  63  16  98 %     09/06/16 1152  143/68  98.6 ??F (37 ??C)  64  16  97 %     09/06/16 0741  140/84  98.9 ??F (37.2 ??C)  (!) 58  16  97 %           Vascular:   Right DP 0/4; PT 0/4   Left DP 1/4; PT 1/4   Capillary fill time <2 seconds   Edema pitting to the right foot. No edema to the left foot   Skin temperature is warm ont eh right foot   Varicosities are absent   Bilateral legs with no evidence of hemosiderin deposits      Dermatological:   Nails are thickened, elongated, discolored, painful to palpation, 19m thick, with subungual debris.    Skin is dry and scaly    No evidence of tinea pedis   No hyperkeratotic lesions       Wound #1   Location: rt plantar 2nd met wound   Etiology: diabetic   Size: see iHeal notes   Margins: hyperkeratotic   Drainage: serous   Odor: yes   Wound base: devitalized tissue   Depth: probes to bone   Probes to bone? yes   Undermining? no   Sinus tracts? possibly   Fluctuance/Subcutaneous crepitus? no    Ascending cellulitis/Lymphangitic streaking? Yes, less redness today      Wound #2   Location: rt plantar medial 1st met   Etiology: diabetic   Size: see iHeal notes   Margins: hyperkeratotic   Drainage: none   Odor: no   Wound base: sc fat   Depth: sc fat   Probes to bone? no   Undermining? no   Sinus tracts? no   Fluctuance/Subcutaneous crepitus? no     Ascending cellulitis/Lymphangitic streaking? No      Wound #3   Location: rt dorsal 2nd pipj   Etiology: diabetic   Size: see iHeal notes   Margins: hyperkeratotic   Drainage: serous   Odor: noe   Wound base: sc fat   Depth: sc fat   Probes to bone? no   Undermining? no   Sinus tracts? no   Fluctuance/Subcutaneous crepitus? no    Ascending cellulitis/Lymphangitic streaking?  No   Less erythema today      Wound #4   Location: left lateral foot blister. Roof intact. No drainage, periwound erythema, edema, crepitus/fluctuance.       Neurological:   Protective sensation per 5.07 Semmes Weinstein monofilament is Rt 0/10 and Lt 0/10   Vibratory sensation at the Rt 1st MPJ reduced/ LT 1st MPJ reduced   Epicritic sensation is intact.   Patient is AAOx3, mood is normal.         Orthopedic:   B/L LE are symmetric   ROM of ankle, STJ, 1st MTPJ is limited   MMT 5 out of 5 for B/L LE.      No pedal amputations noted.        Constitutional: Pt is a 31M obese caucasian.       Lymphatics: negative tenderness to palpation of neck/axillary/inguinal nodes.            Imaging / Cx / Px:   09/03/16 MRI Rt w/o Contrast   IMPRESSION:    1. Second MTP joint dorsal subluxation and evidence of septic arthritis with   likely osteomyelitis of the second metatarsal head and second proximal phalanx.   If there is no clinical signs of infection, findings could represent an   inflammatory arthritis. Consider fluid sampling from the second MTP joint or   second proximal phalanx periosteum.   2. Great toe osteoarthritis and reactive edema. Osteomyelitis is less likely in   the first ray.   3. Third metatarsal head nonacute osteonecrosis is more likely than   osteomyelitis.   4. Skin ulcerations. Cellulitis and myositis. No evidence of drainable abscess   within the limitation of motion artifact and no intravenous contrast.   EPIC email sent to Dr. Donalda Ewings at the time of the dictation.      Addendum:    ??   I discussed the results with Dr. Donalda Ewings at  9:30 AM on 09/03/2016. After reviewing   the images again, there may be tiny foreign bodies in the periosteal fluid of   the second toe at the distal aspect of the second proximal phalanx. Otherwise,   no change in findings or impression.         Labs:     Recent Labs            09/06/16    0513     WBC   6.7     CRP   2.79*     CREA   1.12     BUN   17     GLU   91     HGB   12.7     HCT   38.6     NA   141     K   4.0     CL   113*        CO2   22

## 2016-09-06 NOTE — Procedures (Signed)
[facility]  [facility] ***    Name: Elijah [name], Elijah [name]  MRN: [id number]    Outpatient  DOB: 21 Dec 1950  HIS Order #: [id number]  TRAKnet Visit #: [id number]  Date: 03 Sep 2016    TYPE OF TEST: Peripheral Venous Testing    REASON FOR TEST  Limb swelling    Right Leg:-  Deep venous thrombosis:           No  Superficial venous thrombosis:    No  Deep venous insufficiency:        No  Superficial venous insufficiency: No      INTERPRETATION/FINDINGS  PROCEDURE:  RIGHT LOWER EXTREMITY  VENOUS DUPLEX. Evaluation of lower  extremity veins with ultrasound (B-mode imaging, pulsed Doppler, color   Doppler).  Includes the common femoral, deep femoral, femoral,  popliteal, posterior tibial, peroneal, and great saphenous veins.  Other veins, for example the gastrocnemius and soleal veins, may also  be visualized.    FINDINGS: Unable to fully evaluate the paired right posterior tibial  or peroneal veins due to swelling secondary to patient body  habitus.Gray scale and color flow duplex images of the remaining  veins in the right lower extremity demonstrate normal compressibility,   spontaneous and augmented flow profiles, and absence of filling  defects throughout the deep and superficial veins in the right lower  extremity.    CONCLUSION: Technically difficult study due to patient body  habitus.Right lower extremity venous duplex negative for deep venous  thrombosis or thrombophlebitis in the veins visualized. Left common  femoral vein is thrombus free. NOTES: Enlarged lymphnode noted within  the right groin measuring 2.57 cm x 1.45 cm.    ADDITIONAL COMMENTS    I have personally reviewed the data relevant to the interpretation of  this  study.    TECHNOLOGIST: Zacarias PontesJasmine Smith, RVT  Signed: 09/03/2016 03:25 PM    PHYSICIAN: Vernia BuffMarc T. Sheliah HatchWarner, MD  Signed: 09/06/2016 06:02 AM

## 2016-09-07 LAB — METABOLIC PANEL, COMPREHENSIVE
A-G Ratio: 0.7 — ABNORMAL LOW (ref 1.1–2.2)
ALT (SGPT): 18 U/L (ref 12–78)
AST (SGOT): 17 U/L (ref 15–37)
Albumin: 2.7 g/dL — ABNORMAL LOW (ref 3.5–5.0)
Alk. phosphatase: 64 U/L (ref 45–117)
Anion gap: 12 mmol/L (ref 5–15)
BUN/Creatinine ratio: 13 (ref 12–20)
BUN: 14 MG/DL (ref 6–20)
Bilirubin, total: 0.5 MG/DL (ref 0.2–1.0)
CO2: 22 mmol/L (ref 21–32)
Calcium: 9 MG/DL (ref 8.5–10.1)
Chloride: 109 mmol/L — ABNORMAL HIGH (ref 97–108)
Creatinine: 1.12 MG/DL (ref 0.70–1.30)
GFR est AA: >60 mL/min/{1.73_m2} (ref >60)
GFR est non-AA: >60 mL/min/{1.73_m2} (ref >60)
Globulin: 4.1 g/dL — ABNORMAL HIGH (ref 2.0–4.0)
Glucose: 81 mg/dL (ref 65–100)
Potassium: 4 mmol/L (ref 3.5–5.1)
Protein, total: 6.8 g/dL (ref 6.4–8.2)
Sodium: 143 mmol/L (ref 136–145)

## 2016-09-07 LAB — PROTHROMBIN TIME + INR
INR: 1.1 (ref 0.9–1.1)
Prothrombin time: 10.7 s (ref 9.0–11.1)

## 2016-09-07 LAB — CBC WITH AUTOMATED DIFF
ABS. BASOPHILS: 0.1 10*3/uL (ref 0.0–0.1)
ABS. EOSINOPHILS: 0.2 10*3/uL (ref 0.0–0.4)
ABS. IMM. GRANS.: 0.1 10*3/uL — ABNORMAL HIGH (ref 0.00–0.04)
ABS. LYMPHOCYTES: 1.6 10*3/uL (ref 0.8–3.5)
ABS. MONOCYTES: 0.9 10*3/uL (ref 0.0–1.0)
ABS. NEUTROPHILS: 4.6 10*3/uL (ref 1.8–8.0)
ABSOLUTE NRBC: 0 10*3/uL (ref 0.00–0.01)
BASOPHILS: 1 % (ref 0–1)
EOSINOPHILS: 3 % (ref 0–7)
HCT: 40.5 % (ref 36.6–50.3)
HGB: 13.4 g/dL (ref 12.1–17.0)
IMMATURE GRANULOCYTES: 1 % — ABNORMAL HIGH (ref 0.0–0.5)
LYMPHOCYTES: 22 % (ref 12–49)
MCH: 30.6 PG (ref 26.0–34.0)
MCHC: 33.1 g/dL (ref 30.0–36.5)
MCV: 92.5 FL (ref 80.0–99.0)
MONOCYTES: 12 % (ref 5–13)
MPV: 10.2 FL (ref 8.9–12.9)
NEUTROPHILS: 62 % (ref 32–75)
NRBC: 0 PER 100 WBC
PLATELET: 236 10*3/uL (ref 150–400)
RBC: 4.38 M/uL (ref 4.10–5.70)
RDW: 13.8 % (ref 11.5–14.5)
WBC: 7.4 10*3/uL (ref 4.1–11.1)

## 2016-09-07 LAB — CULTURE, WOUND W GRAM STAIN: GRAM STAIN: NONE SEEN

## 2016-09-07 LAB — CULTURE, MISC. AEROBIC

## 2016-09-07 MED ORDER — FLUCONAZOLE 100 MG TAB
100 mg | Freq: Every day | ORAL | Status: DC
Start: 2016-09-07 — End: 2016-09-09
  Administered 2016-09-07 – 2016-09-09 (×3): via ORAL

## 2016-09-07 MED ORDER — SODIUM CHLORIDE 0.9 % IV PIGGY BACK
3.375 gram | Freq: Three times a day (TID) | INTRAVENOUS | Status: DC
Start: 2016-09-07 — End: 2016-09-08
  Administered 2016-09-07 – 2016-09-08 (×4): via INTRAVENOUS

## 2016-09-07 MED FILL — ATORVASTATIN 20 MG TAB: 20 mg | ORAL | Qty: 2

## 2016-09-07 MED FILL — CEFEPIME 2 GRAM SOLUTION FOR INJECTION: 2 gram | INTRAMUSCULAR | Qty: 2

## 2016-09-07 MED FILL — ENOXAPARIN 40 MG/0.4 ML SUB-Q SYRINGE: 40 mg/0.4 mL | SUBCUTANEOUS | Qty: 0.4

## 2016-09-07 MED FILL — VANCOMYCIN 10 GRAM IV SOLR: 10 gram | INTRAVENOUS | Qty: 2000

## 2016-09-07 MED FILL — PIPERACILLIN-TAZOBACTAM 3.375 GRAM IV SOLR: 3.375 gram | INTRAVENOUS | Qty: 3.38

## 2016-09-07 MED FILL — LYRICA 75 MG CAPSULE: 75 mg | ORAL | Qty: 4

## 2016-09-07 MED FILL — FLUCONAZOLE 100 MG TAB: 100 mg | ORAL | Qty: 4

## 2016-09-07 MED FILL — DULOXETINE 30 MG CAP, DELAYED RELEASE: 30 mg | ORAL | Qty: 2

## 2016-09-07 MED FILL — BYSTOLIC 5 MG TABLET: 5 mg | ORAL | Qty: 2

## 2016-09-07 MED FILL — AMLODIPINE 5 MG TAB: 5 mg | ORAL | Qty: 1

## 2016-09-07 MED FILL — LISINOPRIL 20 MG TAB: 20 mg | ORAL | Qty: 1

## 2016-09-07 NOTE — Telephone Encounter (Signed)
Verified patient with two types of identifiers.  Informed patient that Dr. Gasper LloydSabharwal was out of the office this afternoon and that I would pass along the message. Patient reports that he thinks that he will be discharged tomorrow. Patient will call office to reschedule appointment.

## 2016-09-07 NOTE — Progress Notes (Addendum)
09/07/2016 4:43 PM Bariatric RW delivered to pt.    09/07/2016 11:56 AM [facility] can accept pt but cannot start care until 6/18. CM sent referral to [facility] via [employer].     09/07/2016  11:16 AM RW order received and sent to [facility] via [employer].     09/07/2016 11:00 AM [facility] cannot accept pt due to location.   [facility] spoke with Amy in intake, they will check pt's out of network benefits, CM faxed pt's referral to [phone]. CM will follow up.  CM called and lvm with [facility] regarding pt's referral.      09/07/2016  9:27 AM [facility] the only home health agency in network with pt's insurance in a 40 mile radius is [facility]([phone]). CM called their intake number is [phone]. CM faxed pt's referral to provided fax number. Per podiatry pt will likely need 6 weeks iv abx and woundcare at home. CM will follow up. Fredric MareErin Miller, BSW

## 2016-09-07 NOTE — Telephone Encounter (Signed)
Pt is admitted to the hospital and had to cancel his appointment for tomorrow. He asked if Dr. Gasper LloydSabharwal could come see him. Pt can be reached @ [phone].     Thanks

## 2016-09-07 NOTE — Consults (Signed)
[place] [facility]  CONSULTATION    Elijah [name]  MR#: [id number]  DOB: 1951-01-25  ACCOUNT #: [id number]   DATE OF SERVICE: 09/07/2016    HISTORY OF PRESENT ILLNESS:  The patient is a 66 year old male with a history of idiopathic peripheral neuropathy, hypertension and morbid obesity, who was sent to [facility] by his primary care doctor on 09/03/2016 for evaluation of an abnormal MRI.  The patient stepped on a broken plastic globe from a light fixture approximately 6 weeks ago.  He initially did not notice the wounds because of his peripheral neuropathy.  He was seen by his PCP on 05/02 and was given Augmentin for right lower extremity cellulitis and sent to Podiatry.  The foot was debrided in the outpatient setting by Podiatry, and he was given instructions for local wound care, as well as a 10-day course of doxycycline.  Despite the antibiotics, the wound worsened, and an MRI was ordered, which raised concern for osteomyelitis.  He has been admitted for further evaluation and treatment.  He was taken to the OR on 06/10, where he underwent incision and drainage with a bone biopsy of the right 2nd metatarsal.  Intraoperative cultures are growing coagulase-negative Staphylococcus, enterococcus and Providencia.  He is currently on vancomycin and cefepime, and the infectious diseases service has been asked to assist with antibiotic management.    PAST MEDICAL HISTORY:  Benign prostatic hypertrophy, coronary artery disease, dyslipidemia, hypertension, morbid obesity, obstructive sleep apnea, idiopathic peripheral neuropathy and tubal adenoma of the colon.    PAST SURGICAL HISTORY:  Bilateral hip replacement.    FAMILY HISTORY:  CVA and heart disease.    SOCIAL HISTORY:  He is a former smoker.  No alcohol or illicit drug use.    ALLERGIES:  METOPROLOL.    OUTPATIENT MEDICATIONS:  Please see body of chart for details.  He was  recently treated with Augmentin and doxycycline.    REVIEW OF SYSTEMS:  As per the HPI.  The remainder of 12-system review of systems unremarkable.    LABORATORY DATA:  White blood cell count is 7400, platelet count 236,000.  Creatinine is 1.12.  C-reactive protein is 2.79.  Surgical cultures from the 2nd metatarsal bone are growing oxacillin-resistant Staphylococcus epidermidis, Enterococcus faecalis and Providencia rettgeri.    PHYSICAL EXAMINATION:  VITAL SIGNS:  Temperature 97.8 degrees Fahrenheit, maximal temperature is less than 100, heart rate 61 beats per minute, blood pressure 146/87, respiratory rate 16, oxygen saturation 98% on room air.  GENERAL:  Alert, no acute distress.  HEENT:  Normocephalic, atraumatic.  Mucous membranes moist.  NECK:  Supple.  HEART:  Regular rate and rhythm.  No murmurs, gallops or rubs.  PULMONARY:  Clear to auscultation anteriorly.  ABDOMEN:  Soft, obese, nontender.  EXTREMITIES:  Right foot is bandaged postoperatively.  SKIN:  No rashes.    ASSESSMENT AND PLAN:    1.  Right lower extremity cellulitis, with possible osteomyelitis of the right 2nd metatarsal.  The patient is status post incision and drainage and right 2nd metatarsal bone biopsy on 09/05/2016.  Operative cultures are growing oxacillin-resistant Staphylococcus epidermidis, Providencia rettgeri and Enterococcus.  The patient declining any further surgery at this point and would like to try a 6-week course of intravenous antibiotics to attempt to salvage the foot.  Agree with vancomycin and cefepime.  Will await pathology results, but anticipate he will go home with a 6-week course of vancomycin and cefepime.  This was discussed  at length with the patient at bedside today.  2.  Idiopathic peripheral neuropathy.    Thank you for allowing me to participate in care of this patient.      Malloree Raboin AGermaine Pomfret. Jose Corvin, DO       MAG / DN  D: 09/07/2016 10:13     T: 09/07/2016 10:37  JOB #: [id number]

## 2016-09-07 NOTE — Progress Notes (Signed)
Order placed incorrectly for PICC, order corrected and Thayer OhmChris notified of order being placed

## 2016-09-07 NOTE — Progress Notes (Signed)
Problem: Mobility Impaired (Adult and Pediatric)  Goal: *Acute Goals and Plan of Care (Insert Text)  Physical Therapy Goals  Initiated 09/07/2016  1.  Patient will move from supine to sit and sit to supine  in bed with independence within 7 day(s).    2.  Patient will transfer from bed to chair and chair to bed with modified independence using the least restrictive device within 7 day(s).  3.  Patient will perform sit to stand with modified independence within 7 day(s).  4.  Patient will ambulate with modified independence for 25 feet with the least restrictive device within 7 day(s).   5.  Patient will ascend/descend 4 stairs with 1 handrail(s) with supervision/set-up within 7 day(s).  physical Therapy REEVALUATION  Patient: Elijah [name] (66 y.o. male)  Date: 09/07/2016  Primary Diagnosis: Osteomyelitis (HCC)  CELLULITIS  Procedure(s) (LRB):  INCISION AND DRAINAGERIGHT FOOT, FOREIGN BODY REMOVAL, 2ND METATARSAL BONE BIOPSY (Right) 2 Days Post-Op   Precautions:   Fall (Heel WB only to RLE)  Chart, physical therapy assessment, plan of care and goals were reviewed.    ASSESSMENT :  Based on the objective data described below, the patient presents with decreased transfers and mobility following admission for osteomyelitis. Patient is POD #2 for I&D and bone biopsy of 2nd metatarsal. Patient was seen by PT initially on 09/04/16 and ambulated 300 feet at that time and discharged from services. Today,  PT spoke to Dr. Patrick Swazil[place] on telephone while in patient's room to verify WB status. MD clarified that patient may be heel WB and wear off-loading shoe. Notified nursing of this conversation. PT provided patient with off-loading shoe and he was able to stand and ambulate with bariatric rolling walker 5 feet to hip chair (used due to patient's size). Patient is a bit impulsive and eager to discharge home. He is still waiting for cultures to return and PICC line placement  so not yet ready to discharge. PT will continue to see him daily to improve safety with ambulation but anticipate he will be safe to go in a day or two home with his wife. He will need a bariatric rolling walker and PT will place order..    Patient will benefit from skilled intervention to address the above impairments.  Patient???s rehabilitation potential is considered to be Good  Factors which may influence rehabilitation potential include:   [x]            None noted  []            Mental ability/status  []            Medical condition  []            Home/family situation and support systems  []            Safety awareness  []            Pain tolerance/management  []            Other:      PLAN :  Recommendations and Planned Interventions:  [x]              Bed Mobility Training             []       Neuromuscular Re-Education  [x]              Transfer Training                   []       Orthotic/Prosthetic Training  [  x]             Gait Training                         []       Modalities  [x]              Therapeutic Exercises           []       Edema Management/Control  []              Therapeutic Activities            []       Patient and Family Training/Education  []              Other (comment):  Frequency/Duration: Patient will be followed by physical therapy daily to address goals.  Discharge Recommendations: To Be Determined  Further Equipment Recommendations for Discharge: rolling walker, bariatric     SUBJECTIVE:   Patient stated ???I need to get hold of the doctor. Hand me my cell phone.???    OBJECTIVE DATA SUMMARY:     Past Medical History:   Diagnosis Date   ??? BPH (benign prostatic hyperplasia)    ??? CAD (coronary artery disease)    ??? HLD (hyperlipidemia)    ??? HTN (hypertension)    ??? Morbid obesity due to excess calories (HCC) 04/24/2015   ??? OSA (obstructive sleep apnea)    ??? Polyneuropathy    ??? Tubular adenoma of colon     x2 on colonoscopy 09/18/15     Past Surgical History:   Procedure Laterality Date    ??? HX HIP REPLACEMENT Bilateral      Hospital course since last seen and reason for reevaluation: patient had surgical procedure to RLE.   Critical Behavior:  Neurologic State: Alert  Orientation Level: Oriented X4  Cognition: Appropriate for age attention/concentration  Safety/Judgement: Awareness of environment, Good awareness of safety precautions  Skin:  Not fully observed  Strength:    Strength: Within functional limits (right foot not tested)                      Tone & Sensation:   Tone: Normal              Sensation: Impaired (reports neuropathy to both feet)               Range Of Motion:  AROM: Within functional limits (right foot not tested)                            Functional Mobility:  Bed Mobility:     Supine to Sit: Modified independent;Additional time        Transfers:  Sit to Stand: Minimum assistance;Assist x2  Stand to Sit: Contact guard assistance;Assist x2        Bed to Chair: Minimum assistance;Assist x2           Balance:   Sitting: Intact  Standing: Intact;With support  Ambulation/Gait Training:  Distance (ft): 5 Feet (ft)  Assistive Device: Gait belt;Orthotic device;Walker, rolling  Ambulation - Level of Assistance: Minimal assistance;Assist x2                 Base of Support: Widened     Speed/Cadence: Pace decreased (<100 feet/min)  Functional Measure:  Barthel Index:    Bathing: 0  Bladder: 10  Bowels: 10  Grooming: 5  Dressing: 10  Feeding: 10  Mobility: 5  Stairs: 0  Toilet Use: 5  Transfer (Bed to Chair and Back): 5  Total: 60       Barthel and G-code impairment scale:  Percentage of impairment CH  0% CI  1-19% CJ  20-39% CK  40-59% CL  60-79% CM  80-99% CN  100%   Barthel Score 0-100 100 99-80 79-60 59-40 20-39 1-19   0   Barthel Score 0-20 20 17-19 13-16 9-12 5-8 1-4 0      The Barthel ADL Index: Guidelines  1. The index should be used as a record of what a patient does, not as a record of what a patient could do.   2. The main aim is to establish degree of independence from any help, physical or verbal, however minor and for whatever reason.  3. The need for supervision renders the patient not independent.  4. A patient's performance should be established using the best available evidence. Asking the patient, friends/relatives and nurses are the usual sources, but direct observation and common sense are also important. However direct testing is not needed.  5. Usually the patient's performance over the preceding 24-48 hours is important, but occasionally longer periods will be relevant.  6. Middle categories imply that the patient supplies over 50 per cent of the effort.  7. Use of aids to be independent is allowed.    Clarisa Kindred., Barthel, D.W. [phone]). Functional evaluation: the Barthel Index. Md State Med J (14)2.  Zenaida Niece der Haw[place], J.J.M.F, [place], Ian Malkin., Margret Chance., [place], [place]. (1999). Measuring the change indisability after inpatient rehabilitation; comparison of the responsiveness of the Barthel Index and Functional Independence Measure. Journal of Neurology, Neurosurgery, and Psychiatry, 66(4), 9724783478.  Dawson Bills, N.J.A, [place],  W.J.M, & Koopmanschap, M.A. (2004.) Assessment of post-stroke quality of life in cost-effectiveness studies: The usefulness of the Barthel Index and the EuroQoL-5D. Quality of Life Research, 13, 427-43           Pain:  Pain Scale 1: Numeric (0 - 10)  Pain Intensity 1: 0              Activity Tolerance:   Good.   Please refer to the flowsheet for vital signs taken during this treatment.  After treatment:   [x]   Patient left in no apparent distress sitting up in chair  []   Patient left in no apparent distress in bed  [x]   Call bell left within reach  [x]   Nursing notified  [x]   Caregiver present  []   Bed alarm activated    COMMUNICATION/EDUCATION:   The patient???s plan of care was discussed with: Registered Nurse and Physician.   [x]   Fall prevention education was provided and the patient/caregiver indicated understanding.  []   Patient/family have participated as able in goal setting and plan of care.  [x]   Patient/family agree to work toward stated goals and plan of care.  []   Patient understands intent and goals of therapy, but is neutral about his/her participation.  []   Patient is unable to participate in goal setting and plan of care.    Thank you for this referral.  Floria Raveling, PT   Time Calculation: 15 mins

## 2016-09-07 NOTE — Progress Notes (Signed)
[address]  [place], [place] [address]   Office [phone]  Fax [phone]          Assessment and Plan     Elijah [name] is a 66 y.o. 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 He has spent 4 night(s) in the hospital.    24 Hour Events: NAEO. No new concerns.    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 S/p 10 day augmentin and doxycycline at separate occasions respectively. MRI positive. Negative LE doppler. No PAD in bilateral LE but ABI >1.3 indicating possible calcification. TBI with similar index.   - BCx NG 4d  - Repeat wound cx: staph epi, enterococcus and providencia all susceptible to vanc and cefepime(prelim)   - Continue Vanc and cefepime (day 4), will likely need 6 weeks of abx  - Podiatry following, appreciate recs  - F/u Wound Cx and bone biopsy  ??  HTN: Stable  - Continue home Norvasc 5mg , lisinopril 20mg , bystolic 10mg    ??  Chronic peripheral neuropathy, familial   - Continue Cymbalta and Lyrica 300mg  BID  ??  HLD. TC 162, LDL 58, HDL 40, TG 322 on 04/27/84.  - Continue Lipitor 40mg    - Hold aspirin for now  ??  Obesity BMI Body mass index is 44.3 kg/(m^2).  - Encouraging lifestyle modifications and further follow up outpatient.   ??  FEN/GI - Regular diet per pt request  Activity - Ambulate as tolerated  DVT prophylaxis - Lovenox  GI prophylaxis - Not indicated at this time  Fall prophylaxis - Not indicated at this time.  Disposition - Plan to d/c to Home. Consulting PT/OT  Code Status - DNR, discussed with patient / caregivers.    I appreciate the opportunity to participate in the care of this patient,  Marlowe Sax, MD  [facility] Medicine Resident         Subjective / Objective     Subjective Pt doing well. No new concerns. Denies Cp, sob, dizziness, fever/chills.     Temp (24hrs), Avg:98.5 ??F (36.9 ??C), Min:97.8 ??F (36.6 ??C), Max:99.2 ??F (37.3 ??C)     Objective:   Vital signs: (most recent): Blood pressure 143/67, pulse 63, temperature 99.2 ??F (37.3 ??C), resp. rate 16, height 6\' 2"  (1.88 m), weight 345 lb (156.5 kg), SpO2 98 %.       Visit Vitals   ??? BP 146/87 (BP 1 Location: Right arm, BP Patient Position: At rest)   ??? Pulse 61   ??? Temp 97.8 ??F (36.6 ??C)   ??? Resp 16   ??? Ht 6\' 2"  (1.88 m)   ??? Wt 345 lb (156.5 kg)   ??? SpO2 98%   ??? BMI 44.3 kg/m2     Respiratory: O2 Flow Rate (L/min): 2 l/min O2 Device: CPAP nasal     I/O:    Date 09/06/16 0700 - 09/07/16 0659 09/07/16 0700 - 09/08/16 0659   Shift 0700-1859 1900-0659 24 Hour Total 0700-1859 1900-0659 24 Hour Total   I  N  T  A  K  E   Shift Total  (mL/kg)         O  U  T  P  U  T   Urine  (mL/kg/hr)  200  (0.1) 200  (0.1) 800  800      Urine Voided  200 200 800  800    Shift Total  (mL/kg)  200  (1.3) 200  (  1.3) 800  (5.1)  800  (5.1)   NET  -200 -200 -800  -800   Weight (kg) 156.5 156.5 156.5 156.5 156.5 156.5       Inpatient Medications  Current Facility-Administered Medications   Medication Dose Route Frequency   ??? sodium chloride (NS) flush 5-10 mL  5-10 mL IntraVENous PRN   ??? enoxaparin (LOVENOX) injection 40 mg  40 mg SubCUTAneous Q12H   ??? sodium chloride (NS) flush 5-10 mL  5-10 mL IntraVENous Q8H   ??? cefepime (MAXIPIME) 2 g in 0.9% sodium chloride (MBP/ADV) 100 mL  2 g IntraVENous Q8H   ??? vancomycin (VANCOCIN) 2,000 mg in 0.9% sodium chloride 500 mL IVPB  2,000 mg IntraVENous Q12H   ??? DULoxetine (CYMBALTA) capsule 60 mg  60 mg Oral DAILY   ??? atorvastatin (LIPITOR) tablet 40 mg  40 mg Oral QHS   ??? nebivolol (BYSTOLIC) tablet 10 mg  10 mg Oral QHS   ??? lisinopril (PRINIVIL, ZESTRIL) tablet 20 mg  20 mg Oral DAILY   ??? amLODIPine (NORVASC) tablet 5 mg  5 mg Oral DAILY   ??? pregabalin (LYRICA) capsule 300 mg  300 mg Oral BID         Allergies  Allergies   Allergen Reactions   ??? Metoprolol Other (comments)     Patient states, made me feel horrible.         CBC:  Recent Labs      09/07/16   0425  09/06/16   0513  09/05/16    0550   WBC  7.4  6.7  6.1   HGB  13.4  12.7  13.0   HCT  40.5  38.6  40.2   PLT  236  227  242       Metabolic Panel:  Recent Labs      09/07/16   0425  09/06/16   0513  09/05/16   0550   NA  143  141  142   K  4.0  4.0  4.0   CL  109*  113*  113*   CO2  22  22  21    BUN  14  17  17    CREA  1.12  1.12  1.17   GLU  81  91  90   CA  9.0  8.1*  8.5   ALB  2.7*  2.5*  2.7*   SGOT  17  16  18    ALT  18  21  24          Physical Exam  General:   Alert, cooperative, no acute distress   Head:   Atraumatic   Eyes:   Conjunctivae clear   ENT:  Oral mucosa normal   Neck:  Supple, trachea midline, no adenopathy   No JVD   Back:    No CVA tenderness    Lungs:   Clear to auscultation bilaterally    Heart:   Regular rate and rhythm, no murmur   Abdomen:    Soft, non-tender, ND. No masses or organomegaly    Extremities:  No edema or DVT signs. Pt has marking from quell neuropathy applicator   Skin:  Warm and dry. Ulceration on R sole (2 - 2-3cm diameter with some drainage.   Neurologic:  Oriented. Decreased sensation below half mid calf all the way to toes. No other focal deficits       Urinary catheter:  none     Imaging/procedures:  EKG:        For Billing    Chief Complaint   Patient presents with   ??? Toe Pain       Hospital Problems  Date Reviewed: 09/05/2016          Codes Class Noted POA    * (Principal)Osteomyelitis (HCC) ICD-10-CM: M86.9  ICD-9-CM: 730.20  09/03/2016 Yes        Polyneuropathy (Chronic) ICD-10-CM: G62.9  ICD-9-CM: 356.9  05/28/2016 Yes        Essential hypertension, benign (Chronic) ICD-10-CM: I10  ICD-9-CM: 401.1  02/21/2015 Yes    Overview Signed 08/10/2016  3:46 PM by Jimmey Ralphindy W Gagat, LPN     Overview:   Qualifier: Diagnosis of   By: Claiborne Billingsallahan CMA, Jacqualynn              Coronary atherosclerosis of native coronary artery (Chronic) ICD-10-CM: I25.10  ICD-9-CM: 414.01  02/21/2015 Yes    Overview Addendum 08/10/2016  3:46 PM by Jimmey Ralphindy W Gagat, LPN     S/p CABG x5. DOE was presenting complaint.

## 2016-09-07 NOTE — Progress Notes (Signed)
VAT Note - Chart reviewed for PICC placement evaluation.  Areas reviewed include, but are not limited to, patient's current and past H&P, Lab and Procedure Results, all notes, media records and medications.  Per Dr. Germaine PomfretGentz, pt will likely need 6 weeks IV ABX.  Plan to place when D/c plans final and ok by Dr Germaine PomfretGentz.  D/w Dr Danella Pentonruong.

## 2016-09-07 NOTE — Consults (Signed)
[place] [facility]  [facility]    Rossie Muskratame:Jaskot, Tayton B.  MR#: [id number]  DOB: October 08, 1950  ACCOUNT #: [id number]   DATE OF SERVICE: 09/07/2016    HISTORY OF PRESENT ILLNESS:  The patient is a 66 year old male with a history of idiopathic peripheral neuropathy, hypertension and morbid obesity, who was [facility] by his primary care doctor on 09/03/2016 for evaluation of an abnormal MRI.  The patient stepped on a broken plastic globe from a light fixture approximately 6 weeks ago.  He initially did not notice the wounds because of his peripheral neuropathy.  He was seen by his PCP on 05/02 and was given Augmentin for right lower extremity cellulitis and sent to Podiatry.  The foot was debrided in the outpatient setting by Podiatry, and he was given instructions for local wound care, as well as a 10-day course of doxycycline.  Despite the antibiotics, the wound worsened, and an MRI was ordered, which raised concern for osteomyelitis.  He has been admitted for further evaluation and treatment.  He was taken to the OR on 06/10, where he underwent incision and drainage with a bone biopsy of the right 2nd metatarsal.  Intraoperative cultures are growing coagulase-negative Staphylococcus, enterococcus and Providencia.  He is currently on vancomycin and cefepime, and the infectious diseases service has been asked to assist with antibiotic management.    PAST MEDICAL HISTORY:  Benign prostatic hypertrophy, coronary artery disease, dyslipidemia, hypertension, morbid obesity, obstructive sleep apnea, idiopathic peripheral neuropathy and tubal adenoma of the colon.    PAST SURGICAL HISTORY:  Bilateral hip replacement.    FAMILY HISTORY:  CVA and heart disease.    SOCIAL HISTORY:  He is a former smoker.  No alcohol or illicit drug use.    ALLERGIES:  METOPROLOL.    OUTPATIENT MEDICATIONS:  Please see body of chart for details.  He was recently treated with Augmentin and  doxycycline.    REVIEW OF SYSTEMS:  As per the HPI.  The remainder of 12-system review of systems unremarkable.    LABORATORY DATA:  White blood cell count is 7400, platelet count 236,000.  Creatinine is 1.12.  C-reactive protein is 2.79.  Surgical cultures from the 2nd metatarsal bone are growing oxacillin-resistant Staphylococcus epidermidis, Enterococcus faecalis and Providencia rettgeri.    PHYSICAL EXAMINATION:  VITAL SIGNS:  Temperature 97.8 degrees Fahrenheit, maximal temperature is less than 100, heart rate 61 beats per minute, blood pressure 146/87, respiratory rate 16, oxygen saturation 98% on room air.  GENERAL:  Alert, no acute distress.  HEENT:  Normocephalic, atraumatic.  Mucous membranes moist.  NECK:  Supple.  HEART:  Regular rate and rhythm.  No murmurs, gallops or rubs.  PULMONARY:  Clear to auscultation anteriorly.  ABDOMEN:  Soft, obese, nontender.  EXTREMITIES:  Right foot is bandaged postoperatively.  SKIN:  No rashes.    ASSESSMENT AND PLAN:    1.  Right lower extremity cellulitis, with possible osteomyelitis of the right 2nd metatarsal.  The patient is status post incision and drainage and right 2nd metatarsal bone biopsy on 09/05/2016.  Operative cultures are growing oxacillin-resistant Staphylococcus epidermidis, Providencia rettgeri and Enterococcus.  The patient declining any further surgery at this point and would like to try a 6-week course of intravenous antibiotics to attempt to salvage the foot.  Agree with vancomycin and cefepime.  Will await pathology results, but anticipate he will go home with a 6-week course of vancomycin and cefepime.  This was discussed  at length with the patient at bedside today.  2.  Idiopathic peripheral neuropathy.    Thank you for allowing me to participate in care of this patient.      Malloree Raboin AGermaine Pomfret. Jose Corvin, DO       MAG / DN  D: 09/07/2016 10:13     T: 09/07/2016 10:37  JOB #: [id number]

## 2016-09-08 ENCOUNTER — Inpatient Hospital Stay: Admit: 2016-09-08 | Payer: MEDICARE | Primary: Family Medicine

## 2016-09-08 ENCOUNTER — Encounter: Attending: Specialist | Primary: Family Medicine

## 2016-09-08 LAB — CBC WITH AUTOMATED DIFF
ABS. BASOPHILS: 0.1 10*3/uL (ref 0.0–0.1)
ABS. EOSINOPHILS: 0.2 10*3/uL (ref 0.0–0.4)
ABS. IMM. GRANS.: 0 10*3/uL (ref 0.00–0.04)
ABS. LYMPHOCYTES: 1.5 10*3/uL (ref 0.8–3.5)
ABS. MONOCYTES: 1 10*3/uL (ref 0.0–1.0)
ABS. NEUTROPHILS: 4.5 10*3/uL (ref 1.8–8.0)
ABSOLUTE NRBC: 0 10*3/uL (ref 0.00–0.01)
BASOPHILS: 1 % (ref 0–1)
EOSINOPHILS: 3 % (ref 0–7)
HCT: 40 % (ref 36.6–50.3)
HGB: 13.2 g/dL (ref 12.1–17.0)
IMMATURE GRANULOCYTES: 0 % (ref 0.0–0.5)
LYMPHOCYTES: 20 % (ref 12–49)
MCH: 30.4 PG (ref 26.0–34.0)
MCHC: 33 g/dL (ref 30.0–36.5)
MCV: 92.2 FL (ref 80.0–99.0)
MONOCYTES: 13 % (ref 5–13)
MPV: 10.4 FL (ref 8.9–12.9)
NEUTROPHILS: 62 % (ref 32–75)
NRBC: 0 PER 100 WBC
PLATELET: 220 10*3/uL (ref 150–400)
RBC: 4.34 M/uL (ref 4.10–5.70)
RDW: 13.4 % (ref 11.5–14.5)
WBC: 7.3 10*3/uL (ref 4.1–11.1)

## 2016-09-08 LAB — METABOLIC PANEL, COMPREHENSIVE
A-G Ratio: 0.6 — ABNORMAL LOW (ref 1.1–2.2)
ALT (SGPT): 17 U/L (ref 12–78)
AST (SGOT): 20 U/L (ref 15–37)
Albumin: 2.5 g/dL — ABNORMAL LOW (ref 3.5–5.0)
Alk. phosphatase: 64 U/L (ref 45–117)
Anion gap: 9 mmol/L (ref 5–15)
BUN/Creatinine ratio: 11 — ABNORMAL LOW (ref 12–20)
BUN: 13 MG/DL (ref 6–20)
Bilirubin, total: 0.6 MG/DL (ref 0.2–1.0)
CO2: 23 mmol/L (ref 21–32)
Calcium: 8.7 MG/DL (ref 8.5–10.1)
Chloride: 108 mmol/L (ref 97–108)
Creatinine: 1.22 MG/DL (ref 0.70–1.30)
GFR est AA: >60 mL/min/{1.73_m2} (ref >60)
GFR est non-AA: 59 mL/min/{1.73_m2} — ABNORMAL LOW (ref >60)
Globulin: 4 g/dL (ref 2.0–4.0)
Glucose: 83 mg/dL (ref 65–100)
Potassium: 3.8 mmol/L (ref 3.5–5.1)
Protein, total: 6.5 g/dL (ref 6.4–8.2)
Sodium: 140 mmol/L (ref 136–145)

## 2016-09-08 LAB — CULTURE, ANAEROBIC

## 2016-09-08 MED ORDER — SODIUM CHLORIDE 0.9 % IJ SYRG
Freq: Three times a day (TID) | INTRAMUSCULAR | Status: DC
Start: 2016-09-08 — End: 2016-09-09
  Administered 2016-09-08 – 2016-09-09 (×5)

## 2016-09-08 MED ORDER — WATER FOR INJECTION, STERILE INJECTION
2 mg | INTRAMUSCULAR | Status: DC | PRN
Start: 2016-09-08 — End: 2016-09-09

## 2016-09-08 MED ORDER — SODIUM CHLORIDE 0.9 % IJ SYRG
INTRAMUSCULAR | Status: DC | PRN
Start: 2016-09-08 — End: 2016-09-09
  Administered 2016-09-09: 15:00:00

## 2016-09-08 MED ORDER — PHARMACY VANCOMYCIN NOTE
Freq: Once | Status: AC
Start: 2016-09-08 — End: 2016-09-08
  Administered 2016-09-09

## 2016-09-08 MED ORDER — SODIUM CHLORIDE 0.9 % IJ SYRG
INTRAMUSCULAR | Status: DC
Start: 2016-09-08 — End: 2016-09-09
  Administered 2016-09-08: 20:00:00

## 2016-09-08 MED ORDER — SODIUM CHLORIDE 0.9 % IJ SYRG
INTRAMUSCULAR | Status: DC | PRN
Start: 2016-09-08 — End: 2016-09-09

## 2016-09-08 MED ORDER — CEFTRIAXONE 2 GRAM SOLUTION FOR INJECTION
2 gram | INTRAMUSCULAR | Status: DC
Start: 2016-09-08 — End: 2016-09-09
  Administered 2016-09-08 – 2016-09-09 (×2): via INTRAVENOUS

## 2016-09-08 MED ORDER — METRONIDAZOLE 250 MG TAB
250 mg | Freq: Three times a day (TID) | ORAL | Status: DC
Start: 2016-09-08 — End: 2016-09-09
  Administered 2016-09-08 – 2016-09-09 (×4): via ORAL

## 2016-09-08 MED FILL — VANCOMYCIN 10 GRAM IV SOLR: 10 gram | INTRAVENOUS | Qty: 2000

## 2016-09-08 MED FILL — DULOXETINE 30 MG CAP, DELAYED RELEASE: 30 mg | ORAL | Qty: 2

## 2016-09-08 MED FILL — BD POSIFLUSH NORMAL SALINE 0.9 % INJECTION SYRINGE: INTRAMUSCULAR | Qty: 40

## 2016-09-08 MED FILL — BD POSIFLUSH NORMAL SALINE 0.9 % INJECTION SYRINGE: INTRAMUSCULAR | Qty: 10

## 2016-09-08 MED FILL — ENOXAPARIN 40 MG/0.4 ML SUB-Q SYRINGE: 40 mg/0.4 mL | SUBCUTANEOUS | Qty: 0.4

## 2016-09-08 MED FILL — CEFTRIAXONE 2 GRAM SOLUTION FOR INJECTION: 2 gram | INTRAMUSCULAR | Qty: 2

## 2016-09-08 MED FILL — BYSTOLIC 5 MG TABLET: 5 mg | ORAL | Qty: 2

## 2016-09-08 MED FILL — BD POSIFLUSH NORMAL SALINE 0.9 % INJECTION SYRINGE: INTRAMUSCULAR | Qty: 30

## 2016-09-08 MED FILL — PIPERACILLIN-TAZOBACTAM 3.375 GRAM IV SOLR: 3.375 gram | INTRAVENOUS | Qty: 3.38

## 2016-09-08 MED FILL — PHARMACY VANCOMYCIN NOTE: Qty: 1

## 2016-09-08 MED FILL — LYRICA 75 MG CAPSULE: 75 mg | ORAL | Qty: 4

## 2016-09-08 MED FILL — AMLODIPINE 5 MG TAB: 5 mg | ORAL | Qty: 1

## 2016-09-08 MED FILL — ATORVASTATIN 20 MG TAB: 20 mg | ORAL | Qty: 2

## 2016-09-08 MED FILL — LISINOPRIL 20 MG TAB: 20 mg | ORAL | Qty: 1

## 2016-09-08 MED FILL — METRONIDAZOLE 250 MG TAB: 250 mg | ORAL | Qty: 2

## 2016-09-08 MED FILL — BD POSIFLUSH NORMAL SALINE 0.9 % INJECTION SYRINGE: INTRAMUSCULAR | Qty: 20

## 2016-09-08 MED FILL — FLUCONAZOLE 100 MG TAB: 100 mg | ORAL | Qty: 4

## 2016-09-08 NOTE — Progress Notes (Signed)
[facility] Dosing Services: Vancomycin Dosing Note    Assessment/Plan: Vancomycin trough 18.3 mcg/mL (therapeutic) at 2044 on 09/08/2016. Continue current regimen.    Vanice SarahJenny Beshere, Pharmacist

## 2016-09-08 NOTE — Progress Notes (Signed)
Post Discharge PICC and Antibiotic Orders    1.  Diagnosis:  Polymicrobial DFI  2.  Antibiotic:  Vancomycin 2000mg  IV q 12 hours through 10/18/16                         Ceftriaxone 2gm IV daily through 10/18/16                         metronidazolo 500mg  pio q 8 through 10/18/16                         fluconazole 400mg  po daily through 10/18/16  3.  Routine PICC/ Hohn/ Portacath Care including PRN catheter flow management  4.  Weekly labs:   _x__CBC/diff/platelets   _x__BUN/Creatinine   ___CPK   _x__AST/TotalBilirubin/AlkalinePhosphatase   _x__CRP   ___Gentamicin level  ___gentamicin peak/trough   Trough Vancomycin level ____goal 10-15 _x__goal 15-20  5.  Fax lab to [phone]  Call Critical Lab Values to [phone]  6.  May send to IR for line evaluation or replacement PRN [phone] FAX [phone]  7.  Home Health to pull PIC line at end of therapy or send to IR for Hohn removal  8.  Allergies:    Allergies   Allergen Reactions   ??? Metoprolol Other (comments)     Patient states, made me feel horrible.     9. Pharmacy Consult for Vancomycin dosing/gentamicin dosing.      Ubaldo GlassingMark Rhondalyn Clingan, DO

## 2016-09-08 NOTE — Progress Notes (Addendum)
09/08/2016 4:57 PM CM [facility] via [facility]. [facility] is able to bill pt's insurance out of network benefits and it will be the same as in network. [facility] will provide wound care and iv abx to pt at home on 6/15.   Planning for discharge on 6/14. Picc line [facility] and [facility].     09/08/2016 1:48 PM [facility] is unable to provide pt's woundcare at home due to insurance restrictions. CM will follow up with podiatry regarding outpatient wound care needs.     09/08/2016  10:23 AM Iv abx orders received and sent to [facility] and [facility] via [facility]. Spoke with [facility] residents planning on discharge home tomorrow since home health cannot see pt until 6/15. CM will follow. Fredric MareErin Miller, BSW

## 2016-09-08 NOTE — Progress Notes (Signed)
[facility] Dosing Services: Antimicrobial Stewardship Progress Note    Consult for antibiotic dosing of Vancomycin by Dr. 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   Dose calculated to approximate a therapeutic trough of 15-20 mcg/mL  Last trough level: therapeutic on 09/05/2016  Plan for level / Adjustment in Therapy:continue same. Will recheck trough today before 2000 dose.   Dose administration notes: Doses given appropriately as scheduled    Other Antimicrobial  (not dosed by pharmacist)   Zosyn 3.375 gm IV Q8h  Fluconazole 400 mg PO daily     Cultures      09/05/2016: 2nd metatarsal bone: MRSE sens to Vanc final  E Faecalis sens to Vanc final, Providenci rettgeri sens to Zosyn final (md could possibly use rocephin or cefepime but I am waiting on anaerobic cultures)  Anaerobes: Few mixed anaerobes consisting of GNR anf GPC final  09/03/2016: Blood: NG x5 days pending  09/03/2016: Foot: alpha strep, coag neg staph aureus final, providencii sens to zosyn final      Serum Creatinine     Lab Results   Component Value Date/Time    Creatinine 1.22 09/08/2016 04:25 AM       Creatinine Clearance Estimated Creatinine Clearance: 93 mL/min (based on Cr of 1.22).     Temp   97.9 ??F (36.6 ??C)    WBC   Lab Results   Component Value Date/Time    WBC 7.3 09/08/2016 04:25 AM       H/H   Lab Results   Component Value Date/Time    HGB 13.2 09/08/2016 04:25 AM        Platelets   Lab Results   Component Value Date/Time    PLATELET 220 09/08/2016 04:25 AM          Pharmacist: Signed Hemang Carolanne GrumblingM Patel, PHARMD Contact information: [phone]

## 2016-09-08 NOTE — Progress Notes (Signed)
Problem: Mobility Impaired (Adult and Pediatric)  Goal: *Acute Goals and Plan of Care (Insert Text)  Physical Therapy Goals  Initiated 09/07/2016  1.  Patient will move from supine to sit and sit to supine  in bed with independence within 7 day(s).    2.  Patient will transfer from bed to chair and chair to bed with modified independence using the least restrictive device within 7 day(s).  3.  Patient will perform sit to stand with modified independence within 7 day(s).  4.  Patient will ambulate with modified independence for 25 feet with the least restrictive device within 7 day(s).   5.  Patient will ascend/descend 4 stairs with 1 handrail(s) with supervision/set-up within 7 day(s).   physical Therapy TREATMENT  Patient: Elijah [name] ([age] y.o. male)  Date: 09/08/2016  Diagnosis: Osteomyelitis (HCC)  CELLULITIS Osteomyelitis (HCC)  Procedure(s) (LRB):  INCISION AND DRAINAGERIGHT FOOT, FOREIGN BODY REMOVAL, 2ND METATARSAL BONE BIOPSY (Right) 3 Days Post-Op  Precautions: Fall (Heel WB only to RLE)  Chart, physical therapy assessment, plan of care and goals were reviewed.    ASSESSMENT:  Pt comes to stand with CGA.Pt ambulated 16830ft with RW CGA to SBA with off load shoe on right.Pt left sitting.Pt progressing well.Continue goals.  Progression toward goals:  []     Improving appropriately and progressing toward goals  [x]     Improving slowly and progressing toward goals  []     Not making progress toward goals and plan of care will be adjusted     PLAN:  Patient continues to benefit from skilled intervention to address the above impairments.  Continue treatment per established plan of care.  Discharge Recommendations:  Home Health  Further Equipment Recommendations for Discharge:  rolling walker     SUBJECTIVE:       OBJECTIVE DATA SUMMARY:   Critical Behavior:  Neurologic State: Alert  Orientation Level: Oriented X4  Cognition: Appropriate decision making, Appropriate for age  attention/concentration, Appropriate safety awareness, Follows commands  Safety/Judgement: Awareness of environment, Good awareness of safety precautions  Functional Mobility Training:        Transfers:  Sit to Stand: Contact guard assistance  Stand to Sit: Contact guard assistance                             Balance:  Sitting: Intact  Standing: Intact;With support  Ambulation/Gait Training:  Distance (ft): 130 Feet (ft)  Assistive Device: Gait belt;Walker, rolling  Ambulation - Level of Assistance: Stand-by assistance;Contact guard assistance        Gait Abnormalities: Decreased step clearance        Base of Support: Widened     Speed/Cadence: Pace decreased (<100 feet/min)  Step Length: Left shortened;Right shortened                Pain:  Pain Scale 1: Numeric (0 - 10)  Pain Intensity 1: 0              Activity Tolerance:   Pt tolerated treatment well.  Please refer to the flowsheet for vital signs taken during this treatment.  After treatment:   [x]     Patient left in no apparent distress sitting up in chair  []     Patient left in no apparent distress in bed  []     Call bell left within reach  []     Nursing notified  []     Caregiver present  []   Bed alarm activated    COMMUNICATION/COLLABORATION:   The patient???s plan of care was discussed with: Physical Therapist    Kary Kos, PTA   Time Calculation: 23 mins

## 2016-09-08 NOTE — Progress Notes (Signed)
5:56 PM  Pt concerned he has not seen podiatry.  Concerned that bandage is coming loose.  No order to change bandage, ACE bandage reinforced.

## 2016-09-08 NOTE — Progress Notes (Addendum)
[address]  [place], [place] [id number]   Office [phone]  Fax [phone]          Assessment and Plan     Elijah [name] is a 66 y.o. 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 He has spent 5 night(s) in the hospital.    24 Hour Events: NAEO. No new concerns.    R. 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 S/p 10 day augmentin and doxycycline at separate occasions respectively before poa. MRI positive. Negative LE doppler. No PAD in bilateral LE but ABI >1.3 indicating possible calcification. TBI with similar index. BCx NG 4d. Repeat wound cx: staph epi, enterococcus and providencia all susceptible to vanc and cefepime(prelim). Pathology report with fragment of trabecular bone showing chronic inflammation with histocytes and marrow space, active osteomyelitis is not identified.    - Continue Vanc and cefepime (day 4), will likely need 6 weeks of Vanc and cefepime per ID  - PICC placement today  - Podiatry and ID following, appreciate recs  ??  HTN: Stable  - Continue home Norvasc 5mg , lisinopril 20mg , bystolic 10mg    ??  Chronic peripheral neuropathy, familial   - Continue Cymbalta and Lyrica 300mg  BID  ??  HLD. TC 162, LDL 58, HDL 40, TG 322 on 6/96/293/27/18.  - Continue Lipitor 40mg    - Hold aspirin for now  ??  Obesity BMI Body mass index is 44.3 kg/(m^2).  - Encouraging lifestyle modifications and further follow up outpatient.   ??  FEN/GI - Regular diet per pt request  Activity - Ambulate as tolerated  DVT prophylaxis - Lovenox  GI prophylaxis - Not indicated at this time  Fall prophylaxis - Not indicated at this time.  Disposition - Plan to d/c to Home. HH to start on 6/18, need to clarify abx access (infusion center vs at home)  Code Status - DNR, discussed with patient / caregivers.    I appreciate the opportunity to participate in the care of this patient,    Pt is discussed and seen with Dr Pryor OchoaPaine (attending physician).      Yolonda Kidaaniel S Mckaela Howley, MD  Family Medicine Resident         Subjective / Objective     Subjective Pt doing well. No new concerns. Asking about PICC and Podiatry. Denies Cp, sob, dizziness, fever/chills.     Temp (24hrs), Avg:98.8 ??F (37.1 ??C), Min:98.1 ??F (36.7 ??C), Max:99.5 ??F (37.5 ??C)     Objective:  Vital signs: (most recent): Blood pressure 139/62, pulse 69, temperature 98.2 ??F (36.8 ??C), resp. rate 20, height 6\' 2"  (1.88 m), weight 345 lb (156.5 kg), SpO2 95 %.       Visit Vitals   ??? BP 124/73 (BP 1 Location: Left arm, BP Patient Position: At rest;Lying right side)   ??? Pulse 61   ??? Temp 98.1 ??F (36.7 ??C)   ??? Resp 19   ??? Ht 6\' 2"  (1.88 m)   ??? Wt 345 lb (156.5 kg)   ??? SpO2 97%   ??? BMI 44.3 kg/m2     Respiratory: O2 Flow Rate (L/min): 2 l/min O2 Device: Room air     I/O:    Date 09/07/16 0700 - 09/08/16 0659 09/08/16 0700 - 09/09/16 0659   Shift 0700-1859 1900-0659 24 Hour Total 0700-1859 1900-0659 24 Hour Total   I  N  T  A  K  E   I.V.  (mL/kg/hr) 600  (  0.3)  600  (0.2)         Volume (vancomycin (VANCOCIN) 2,000 mg in 0.9% sodium chloride 500 mL IVPB) 500  500         Volume (piperacillin-tazobactam (ZOSYN) 3.375 g in 0.9% sodium chloride (MBP/ADV) 100 mL) 100  100       Shift Total  (mL/kg) 600  (3.8)  600  (3.8)      O  U  T  P  U  T   Urine  (mL/kg/hr) 1350  (0.7) 1475  (0.8) 2825  (0.8)         Urine Voided 1350 1475 2825         Urine Occurrence(s) 1 x  1 x       Shift Total  (mL/kg) 1350  (8.6) 1475  (9.4) 2825  (18.1)      NET -750 -1475 -2225      Weight (kg) 156.5 156.5 156.5 156.5 156.5 156.5       Inpatient Medications  Current Facility-Administered Medications   Medication Dose Route Frequency   ??? piperacillin-tazobactam (ZOSYN) 3.375 g in 0.9% sodium chloride (MBP/ADV) 100 mL  3.375 g IntraVENous Q8H   ??? fluconazole (DIFLUCAN) tablet 400 mg  400 mg Oral DAILY   ??? sodium chloride (NS) flush 5-10 mL  5-10 mL IntraVENous PRN   ??? enoxaparin (LOVENOX) injection 40 mg  40 mg SubCUTAneous Q12H    ??? sodium chloride (NS) flush 5-10 mL  5-10 mL IntraVENous Q8H   ??? vancomycin (VANCOCIN) 2,000 mg in 0.9% sodium chloride 500 mL IVPB  2,000 mg IntraVENous Q12H   ??? DULoxetine (CYMBALTA) capsule 60 mg  60 mg Oral DAILY   ??? atorvastatin (LIPITOR) tablet 40 mg  40 mg Oral QHS   ??? nebivolol (BYSTOLIC) tablet 10 mg  10 mg Oral QHS   ??? lisinopril (PRINIVIL, ZESTRIL) tablet 20 mg  20 mg Oral DAILY   ??? amLODIPine (NORVASC) tablet 5 mg  5 mg Oral DAILY   ??? pregabalin (LYRICA) capsule 300 mg  300 mg Oral BID         Allergies  Allergies   Allergen Reactions   ??? Metoprolol Other (comments)     Patient states, made me feel horrible.         CBC:  Recent Labs      09/08/16   0425  09/07/16   0425  09/06/16   0513   WBC  7.3  7.4  6.7   HGB  13.2  13.4  12.7   HCT  40.0  40.5  38.6   PLT  220  236  227       Metabolic Panel:  Recent Labs      09/08/16   0425  09/07/16   1102  09/07/16   0425  09/06/16   0513   NA  140   --   143  141   K  3.8   --   4.0  4.0   CL  108   --   109*  113*   CO2  23   --   22  22   BUN  13   --   14  17   CREA  1.22   --   1.12  1.12   GLU  83   --   81  91   CA  8.7   --   9.0  8.1*   ALB  2.5*   --  2.7*  2.5*   SGOT  20   --   17  16   ALT  17   --   18  21   INR   --   1.1   --    --          Physical Exam  General:   Alert, cooperative, no acute distress   Head:   Atraumatic   Eyes:   Conjunctivae clear   ENT:  Oral mucosa normal   Neck:  Supple, trachea midline, no adenopathy   No JVD   Back:    No CVA tenderness    Lungs:   Clear to auscultation bilaterally    Heart:   Regular rate and rhythm, no murmur   Abdomen:    Soft, non-tender, ND. No masses or organomegaly    Extremities:  No edema or DVT signs. Pt has marking from quell neuropathy applicator   Skin:  Warm and dry. Leg wrapped well   Neurologic:  Oriented. Decreased sensation below half mid calf all the way to toes. No other focal deficits       Urinary catheter:  none     Imaging/procedures:    EKG:        For Billing     Chief Complaint   Patient presents with   ??? Toe Pain       Hospital Problems  Date Reviewed: 09/08/2016          Codes Class Noted POA    * (Principal)Osteomyelitis (HCC) ICD-10-CM: M86.9  ICD-9-CM: 730.20  09/03/2016 Yes        Polyneuropathy (Chronic) ICD-10-CM: G62.9  ICD-9-CM: 356.9  05/28/2016 Yes        Essential hypertension, benign (Chronic) ICD-10-CM: I10  ICD-9-CM: 401.1  02/21/2015 Yes    Overview Signed 08/10/2016  3:46 PM by Jimmey Ralph, LPN     Overview:   Qualifier: Diagnosis of   By: Claiborne Billings CMA, Jacqualynn              Coronary atherosclerosis of native coronary artery (Chronic) ICD-10-CM: I25.10  ICD-9-CM: 414.01  02/21/2015 Yes    Overview Addendum 08/10/2016  3:46 PM by Jimmey Ralph, LPN     S/p CABG x5. DOE was presenting complaint.

## 2016-09-08 NOTE — Progress Notes (Signed)
Shift Summary    0700  Bedside and Verbal shift change report given to S. Powers RN (Cabin crewoncoming nurse) by    Museum/gallery conservatorN (offgoing nurse). Report included the following information SBAR, Kardex, MAR and Recent Results.

## 2016-09-08 NOTE — Progress Notes (Signed)
PICC Placement Note    PRE-PROCEDURE VERIFICATION  Correct Procedure: yes  Correct Site:  yes  Temperature: Temp: 97.9 ??F (36.6 ??C), Temperature Source: Temp Source: Oral  Recent Labs      09/08/16   0425  09/07/16   1102   BUN  13   --    CREA  1.22   --    PLT  220   --    INR   --   1.1   WBC  7.3   --      Allergies: Metoprolol  Education materials for PICC Care given: yes. See Patient Education activity for further details.  PICC Booklet placed at bedside: yes    Closed Ended PICC Catheters:  Flush Lumens as Follows:  Intermittent Medication:   Flush before and after each medication with 10 ml NS.   Unused Ports:  Flush every 8 hours with 10 ml NS.  TPN Ports:  Flush every 24 hours with 20 ml NS prior to hanging new bag.  Blood Draws: Stop infusion, draw off and waste 10 ml of blood. Draw sample with 10cc syringe or greater. DO NOT USE VACUTAINER . Transfer with appropriate device to lab  tubes. Flush with 20 ml NS.  Dressing Change:  Every 7 days, and PRN using sterile technique if integrity of dressing is compromised.  Initial dressing change for central line 24-48 hours post insertion if gauze is used. Apply new dressing per policy.    PROCEDURE DETAIL  Consent was obtained and all questions were answered related to risks and benefits.   A double lumen PICC line was inserted, as a sterile procedure using ultrasound and modified Seldinger technique for Home IV Therapy. The following documentation is in addition to the PICC properties in the lines/airways flowsheet :  Lot #: [id number]  Lidocaine 1% administered intradermally :yes  Internal Catheter Total Length: 48 (cm) with 1 cm out  Vein Selection for PICC:right brachial  Central Line Bundle followed yes  Complication Related to Insertion:no    The placement was verified by EKG, MAX P WAVE @ 48 (cm) with 1 cm out. PER EKG PICC TIP @ C/A junction.     Lizabeth LeydenLynn M Bazzoli, RN

## 2016-09-08 NOTE — Progress Notes (Signed)
Bedside shift change report given to Susan Beauford, RN (oncoming nurse) by Delis Bediako, RN (offgoing nurse). Report included the following information SBAR, Kardex, Procedure Summary, Intake/Output, MAR and Recent Results.

## 2016-09-08 NOTE — Progress Notes (Addendum)
Patient seen at bedside.   No new issues. Ready to go home tomorrow.Denies f/c/n/v, chest pain, sob, dyspnea    The right foot has reducing erythema and edema. No purulence. The plantar 2nd met wound appears slightly smaller. Right medial 2nd digit is well coapt.  Left foot lateral blister now draining clear serous fluid   Now has PICC line. Going home on 6 weeks of iv abx.   Pnt understands to followup in the wound care center next weel with me.   Wound care- Right foot plantar 2nd metatarsal wound- sterile packing, betadine soaked 4x4 gauze, kerlix, ace                         Right foot medial wound- betadine soaked 4x4 gauze, kerlix, ace                         Right foot dorsal 2nd digit- betadine soaked 4x4 gauze, kerlix, ace                         Left foot lateral blister- betadine, mepilex  (or 4x4 gauze/kerlix/ace instead of mepilex)

## 2016-09-08 NOTE — Progress Notes (Signed)
Visit documented 09/08/16  Spiritual Care Partner Volunteer visited patient in 4 Post Surg Ortho on 09/07/16.  Documented by: Chaplain Martha J. Pittenger BCC  Chaplain Paging Service [phone]

## 2016-09-08 NOTE — Progress Notes (Addendum)
Notified Dr. Durwin NoraEndowtaku of pt's elevated temperature, pt stated he feels feverish temp 100.7. MD to place orders.     Bedside shift change report given to Gunnar FusiPaula, RN (oncoming nurse) by Nani Gasserelis Bediako, RN (offgoing nurse). Report included the following information SBAR, Kardex, Procedure Summary, Intake/Output, MAR and Recent Results.

## 2016-09-08 NOTE — Progress Notes (Signed)
[facility] Progress Note           Ubaldo Glassing DO    [phone] Office  [phone]  Fax    09/08/2016      Assessment & Plan:   1. Right lower extremity cellulitis, with suspected  osteomyelitis of the right 2nd metatarsal. S/p I&D and right 2nd metatarsal bone biopsy on 09/05/2016.  Operative cultures are growing MRSE, anaerobes, Providencia rettgeri, enterococcus and candida albicans  The patient is declining any further surgery at this point and would like to try a 6-week course of intravenous antibiotics to attempt to salvage the foot.  Pathology negative but this was a bone biopsy and may have missed area of OM.  Plan 6 weeks vancomycin and ceftriaxone along with PO metro and fluconazole.  2.  Idiopathic peripheral neuropathy.          Subjective:     No complaints    Objective:     Vitals:   Visit Vitals   ??? BP 129/77 (BP 1 Location: Left arm, BP Patient Position: Sitting)   ??? Pulse 62   ??? Temp 98.9 ??F (37.2 ??C)   ??? Resp 20   ??? Ht 6\' 2"  (1.88 m)   ??? Wt 156.5 kg (345 lb)   ??? SpO2 95%   ??? BMI 44.3 kg/m2        Tmax:  Temp (24hrs), Avg:98.8 ??F (37.1 ??C), Min:98.1 ??F (36.7 ??C), Max:99.5 ??F (37.5 ??C)      Exam:   Patient is intubated:  no    Physical Examination:   General:  Alert, cooperative, no distress   Head:  Normocephalic, atraumatic.   Eyes:  Conjunctivae clear   Neck: Supple       Lungs:   No distress.    Chest wall:     Heart:     Abdomen:   obese   Extremities: Moves all.    Skin:  No rash.  RLE dressed   Neurologic: CNII-XII intact. Normal strength     Labs:        No lab exists for component: ITNL   No results for input(s): CPK, CKMB, TROIQ in the last 72 hours.  Recent Labs      09/08/16   0425  09/07/16   0425  09/06/16   0513   NA  140  143  141   K  3.8  4.0  4.0   CL  108  109*  113*   CO2  23  22  22    BUN  13  14  17    CREA  1.22  1.12  1.12   GLU  83  81  91   ALB  2.5*  2.7*  2.5*   WBC  7.3  7.4  6.7   HGB  13.2  13.4  12.7   HCT  40.0  40.5  38.6    PLT  220  236  227     Recent Labs      09/07/16   1102   INR  1.1   PTP  10.7     Needs: urine analysis, urine sodium, protein and creatinine  No results found for: NAU, CREAU      Cultures:     No results found for: SDES  Lab Results   Component Value Date/Time    Culture result: (A) 09/05/2016 09:39 AM     SCANT STAPHYLOCOCCUS EPIDERMIDIS (OXACILLIN RESISTANT)    Culture result:  SCANT ENTEROCOCCUS FAECALIS GROUP D (A) 09/05/2016 09:39 AM    Culture result: RARE PROVIDENCIA RETTGERI (A) 09/05/2016 09:39 AM    Culture result: RARE CANDIDA ALBICANS (A) 09/05/2016 09:39 AM    Culture result: CHECKING FOR POSSIBLE  ANAEROBES   09/05/2016 09:39 AM       Radiology:     Medications       Current Facility-Administered Medications   Medication Dose Route Frequency Last Dose   ??? piperacillin-tazobactam (ZOSYN) 3.375 g in 0.9% sodium chloride (MBP/ADV) 100 mL  3.375 g IntraVENous Q8H 3.375 g at 09/08/16 0506   ??? fluconazole (DIFLUCAN) tablet 400 mg  400 mg Oral DAILY 400 mg at 09/08/16 0844   ??? sodium chloride (NS) flush 5-10 mL  5-10 mL IntraVENous PRN     ??? enoxaparin (LOVENOX) injection 40 mg  40 mg SubCUTAneous Q12H 40 mg at 09/08/16 0844   ??? sodium chloride (NS) flush 5-10 mL  5-10 mL IntraVENous Q8H 10 mL at 09/07/16 2250   ??? vancomycin (VANCOCIN) 2,000 mg in 0.9% sodium chloride 500 mL IVPB  2,000 mg IntraVENous Q12H 2,000 mg at 09/08/16 0838   ??? DULoxetine (CYMBALTA) capsule 60 mg  60 mg Oral DAILY 60 mg at 09/08/16 0844   ??? atorvastatin (LIPITOR) tablet 40 mg  40 mg Oral QHS 40 mg at 09/07/16 2249   ??? nebivolol (BYSTOLIC) tablet 10 mg  10 mg Oral QHS 10 mg at 09/07/16 2249   ??? lisinopril (PRINIVIL, ZESTRIL) tablet 20 mg  20 mg Oral DAILY 20 mg at 09/08/16 0844   ??? amLODIPine (NORVASC) tablet 5 mg  5 mg Oral DAILY 5 mg at 09/08/16 0844   ??? pregabalin (LYRICA) capsule 300 mg  300 mg Oral BID 300 mg at 09/08/16 [id number]           Case discussed with: FP, CM      Ubaldo GlassingMark Dafina Suk, DO

## 2016-09-09 ENCOUNTER — Inpatient Hospital Stay: Admit: 2016-09-09 | Payer: MEDICARE | Primary: Family Medicine

## 2016-09-09 LAB — VANCOMYCIN, TROUGH: Vancomycin,trough: 18.3 ug/mL — ABNORMAL HIGH (ref 5.0–10.0)

## 2016-09-09 LAB — METABOLIC PANEL, COMPREHENSIVE
A-G Ratio: 0.6 — ABNORMAL LOW (ref 1.1–2.2)
ALT (SGPT): 31 U/L (ref 12–78)
AST (SGOT): 34 U/L (ref 15–37)
Albumin: 2.6 g/dL — ABNORMAL LOW (ref 3.5–5.0)
Alk. phosphatase: 62 U/L (ref 45–117)
Anion gap: 9 mmol/L (ref 5–15)
BUN/Creatinine ratio: 12 (ref 12–20)
BUN: 14 MG/DL (ref 6–20)
Bilirubin, total: 0.4 MG/DL (ref 0.2–1.0)
CO2: 24 mmol/L (ref 21–32)
Calcium: 8.6 MG/DL (ref 8.5–10.1)
Chloride: 107 mmol/L (ref 97–108)
Creatinine: 1.16 MG/DL (ref 0.70–1.30)
GFR est AA: >60 mL/min/{1.73_m2} (ref >60)
GFR est non-AA: >60 mL/min/{1.73_m2} (ref >60)
Globulin: 4.1 g/dL — ABNORMAL HIGH (ref 2.0–4.0)
Glucose: 83 mg/dL (ref 65–100)
Potassium: 3.9 mmol/L (ref 3.5–5.1)
Protein, total: 6.7 g/dL (ref 6.4–8.2)
Sodium: 140 mmol/L (ref 136–145)

## 2016-09-09 LAB — CBC WITH AUTOMATED DIFF
ABS. BASOPHILS: 0.1 10*3/uL (ref 0.0–0.1)
ABS. EOSINOPHILS: 0.2 10*3/uL (ref 0.0–0.4)
ABS. IMM. GRANS.: 0 10*3/uL (ref 0.00–0.04)
ABS. LYMPHOCYTES: 1 10*3/uL (ref 0.8–3.5)
ABS. MONOCYTES: 0.9 10*3/uL (ref 0.0–1.0)
ABS. NEUTROPHILS: 4.6 10*3/uL (ref 1.8–8.0)
ABSOLUTE NRBC: 0 10*3/uL (ref 0.00–0.01)
BASOPHILS: 1 % (ref 0–1)
EOSINOPHILS: 2 % (ref 0–7)
HCT: 39.2 % (ref 36.6–50.3)
HGB: 13.1 g/dL (ref 12.1–17.0)
IMMATURE GRANULOCYTES: 0 % (ref 0.0–0.5)
LYMPHOCYTES: 15 % (ref 12–49)
MCH: 30.3 PG (ref 26.0–34.0)
MCHC: 33.4 g/dL (ref 30.0–36.5)
MCV: 90.7 FL (ref 80.0–99.0)
MONOCYTES: 13 % (ref 5–13)
MPV: 10.3 FL (ref 8.9–12.9)
NEUTROPHILS: 69 % (ref 32–75)
NRBC: 0 PER 100 WBC
PLATELET: 217 10*3/uL (ref 150–400)
RBC: 4.32 M/uL (ref 4.10–5.70)
RDW: 13.3 % (ref 11.5–14.5)
WBC: 6.7 10*3/uL (ref 4.1–11.1)

## 2016-09-09 LAB — CULTURE, BLOOD: Culture result:: NO GROWTH

## 2016-09-09 MED ORDER — METRONIDAZOLE 500 MG TAB
500 mg | ORAL_TABLET | Freq: Three times a day (TID) | ORAL | 0 refills | Status: DC
Start: 2016-09-09 — End: 2016-09-09

## 2016-09-09 MED ORDER — CEFTRIAXONE 2 GRAM SOLUTION FOR INJECTION
2 gram | INTRAMUSCULAR | 0 refills | Status: DC
Start: 2016-09-09 — End: 2016-09-09

## 2016-09-09 MED ORDER — ADV ADDAPTOR
2 gram | 0 refills | Status: AC
Start: 2016-09-09 — End: 2016-10-19

## 2016-09-09 MED ORDER — FLUCONAZOLE 200 MG TAB
200 mg | ORAL_TABLET | Freq: Every day | ORAL | 0 refills | Status: AC
Start: 2016-09-09 — End: 2016-10-18

## 2016-09-09 MED ORDER — ACETAMINOPHEN 325 MG TABLET
325 mg | Freq: Four times a day (QID) | ORAL | Status: DC | PRN
Start: 2016-09-09 — End: 2016-09-09
  Administered 2016-09-09: 04:00:00 via ORAL

## 2016-09-09 MED ORDER — METRONIDAZOLE 500 MG TAB
500 mg | ORAL_TABLET | Freq: Three times a day (TID) | ORAL | 0 refills | Status: DC
Start: 2016-09-09 — End: 2016-10-15

## 2016-09-09 MED ORDER — SODIUM CHLORIDE 0.9 % IV
10 gram | Freq: Two times a day (BID) | INTRAVENOUS | 0 refills | Status: DC
Start: 2016-09-09 — End: 2016-09-09

## 2016-09-09 MED ORDER — FLUCONAZOLE 200 MG TAB
200 mg | ORAL_TABLET | Freq: Every day | ORAL | 0 refills | Status: DC
Start: 2016-09-09 — End: 2016-09-09

## 2016-09-09 MED ORDER — VANCOMYCIN 10 GRAM IV SOLR
10 gram | Freq: Two times a day (BID) | INTRAVENOUS | 0 refills | Status: AC
Start: 2016-09-09 — End: 2016-10-18

## 2016-09-09 MED FILL — VANCOMYCIN 10 GRAM IV SOLR: 10 gram | INTRAVENOUS | Qty: 2000

## 2016-09-09 MED FILL — METRONIDAZOLE 250 MG TAB: 250 mg | ORAL | Qty: 2

## 2016-09-09 MED FILL — LYRICA 75 MG CAPSULE: 75 mg | ORAL | Qty: 4

## 2016-09-09 MED FILL — DULOXETINE 30 MG CAP, DELAYED RELEASE: 30 mg | ORAL | Qty: 2

## 2016-09-09 MED FILL — AMLODIPINE 5 MG TAB: 5 mg | ORAL | Qty: 1

## 2016-09-09 MED FILL — BYSTOLIC 5 MG TABLET: 5 mg | ORAL | Qty: 2

## 2016-09-09 MED FILL — ENOXAPARIN 40 MG/0.4 ML SUB-Q SYRINGE: 40 mg/0.4 mL | SUBCUTANEOUS | Qty: 0.4

## 2016-09-09 MED FILL — FLUCONAZOLE 100 MG TAB: 100 mg | ORAL | Qty: 4

## 2016-09-09 MED FILL — LISINOPRIL 20 MG TAB: 20 mg | ORAL | Qty: 1

## 2016-09-09 MED FILL — MAPAP (ACETAMINOPHEN) 325 MG TABLET: 325 mg | ORAL | Qty: 2

## 2016-09-09 MED FILL — CEFTRIAXONE 2 GRAM SOLUTION FOR INJECTION: 2 gram | INTRAMUSCULAR | Qty: 2

## 2016-09-09 MED FILL — ATORVASTATIN 20 MG TAB: 20 mg | ORAL | Qty: 2

## 2016-09-09 NOTE — Progress Notes (Addendum)
09/09/2016 12:17 PM [facility] rep is reviewing pt's iv abx with pt and pt's wife at bedside. [facility] will deliver pt's iv abx to pt's home address at [address], [place], [place] [id number].   [facility] was sent to discharge clinicals and notified of discharge home today via [employer].     09/09/2016 9:55 AM [facility]. Planning for discharge today. CM scheduled pt's podiatry 1 week follow up. Was unable to schedule pt with Dr. [place] as he is out office next week, appt scheduled with Dr. Diamond NickelErdman on 6/19 at 10:30am. Appointment added to avs. CM will follow. Fredric MareErin Miller, BSW

## 2016-09-09 NOTE — Progress Notes (Signed)
Problem: Patient Education: Go to Patient Education Activity  Goal: Patient/Family Education  physical Therapy TREATMENT  Patient: Elijah [name] ([age] y.o. male)  Date: 09/09/2016  Diagnosis: Osteomyelitis (HCC)  CELLULITIS Osteomyelitis (HCC)  Procedure(s) (LRB):  INCISION AND DRAINAGERIGHT FOOT, FOREIGN BODY REMOVAL, 2ND METATARSAL BONE BIOPSY (Right) 4 Days Post-Op  Precautions: Fall (Heel WB only to RLE)  Chart, physical therapy assessment, plan of care and goals were reviewed.    ASSESSMENT:  Pt comes to stand with CGA.Pt ambulated 12100ft with RW CGA with off load shoe on right.Pt balance is good on level surface.Pt left sitting in chair.Pt progressing well.Continue goals.  Progression toward goals:  []     Improving appropriately and progressing toward goals  [x]     Improving slowly and progressing toward goals  []     Not making progress toward goals and plan of care will be adjusted     PLAN:  Patient continues to benefit from skilled intervention to address the above impairments.  Continue treatment per established plan of care.  Discharge Recommendations:  Home Health  Further Equipment Recommendations for Discharge:  rolling walker     SUBJECTIVE:       OBJECTIVE DATA SUMMARY:   Critical Behavior:  Neurologic State: Alert  Orientation Level: Oriented X4  Cognition: Appropriate decision making, Appropriate for age attention/concentration, Appropriate safety awareness, Follows commands  Safety/Judgement: Awareness of environment, Good awareness of safety precautions  Functional Mobility Training:  Bed Mobility:                    Transfers:  Sit to Stand: Contact guard assistance  Stand to Sit: Contact guard assistance                             Balance:  Sitting: Intact  Standing: Intact;With support  Ambulation/Gait Training:  Distance (ft): 100 Feet (ft)  Assistive Device: Walker, rolling;Gait belt  Ambulation - Level of Assistance: Contact guard assistance         Gait Abnormalities: Decreased step clearance        Base of Support: Widened        Step Length: Left shortened;Right shortened                 Activity Tolerance:   Pt tolerated treatment well.  Please refer to the flowsheet for vital signs taken during this treatment.  After treatment:   [x]     Patient left in no apparent distress sitting up in chair  []     Patient left in no apparent distress in bed  []     Call bell left within reach  []     Nursing notified  []     Caregiver present  []     Bed alarm activated    COMMUNICATION/COLLABORATION:   The patient???s plan of care was discussed with: Physical Therapist    Anell BarrSteve M Bolling, PTA   Time Calculation: 23 mins

## 2016-09-09 NOTE — Progress Notes (Signed)
[facility] Progress Note           Ubaldo Glassing DO    [phone] Office  [phone]  Fax    09/09/2016      Assessment & Plan:   1. Right lower extremity cellulitis, with suspected  osteomyelitis of the right 2nd metatarsal. S/p I&D and right 2nd metatarsal bone biopsy on 09/05/2016.  Operative cultures are growing MRSE, anaerobes, providencia rettgeri, enterococcus and candida albicans  The patient is declining any further surgery at this point and would like to try a 6-week course of intravenous antibiotics to attempt to salvage the foot.  Pathology negative but this was a bone biopsy and may have missed area of OM.  Plan 6 weeks vancomycin and ceftriaxone along with PO metro and fluconazole.  2.  Idiopathic peripheral neuropathy.  3. Low grade fever.  WBC stable.  Foot looks good per podiatry.  Discussed risk of PICC related infection and  Drug fever with patient            Subjective:     No complaints  Low grade fever    Objective:     Vitals:   Visit Vitals   ??? BP 134/81 (BP 1 Location: Left arm, BP Patient Position: At rest)   ??? Pulse (!) 59   ??? Temp 97.9 ??F (36.6 ??C)   ??? Resp 16   ??? Ht 6\' 2"  (1.88 m)   ??? Wt 156.5 kg (345 lb)   ??? SpO2 98%   ??? BMI 44.3 kg/m2        Tmax:  Temp (24hrs), Avg:98.7 ??F (37.1 ??C), Min:97.9 ??F (36.6 ??C), Max:100.7 ??F (38.2 ??C)      Exam:   Patient is intubated:  no    Physical Examination:   General:  Alert, cooperative, no distress   Head:  Normocephalic, atraumatic.   Eyes:  Conjunctivae clear   Neck: Supple       Lungs:   No distress.    Chest wall:     Heart:     Abdomen:   obese   Extremities: Moves all.    Skin:  No rash.  RLE dressed   Neurologic: CNII-XII intact. Normal strength     Labs:        No lab exists for component: ITNL   No results for input(s): CPK, CKMB, TROIQ in the last 72 hours.  Recent Labs      09/09/16   0016  09/08/16   0425  09/07/16   0425   NA  140  140  143   K  3.9  3.8  4.0   CL  107  108  109*   CO2  24  23  22     BUN  14  13  14    CREA  1.16  1.22  1.12   GLU  83  83  81   ALB  2.6*  2.5*  2.7*   WBC  6.7  7.3  7.4   HGB  13.1  13.2  13.4   HCT  39.2  40.0  40.5   PLT  217  220  236     Recent Labs      09/07/16   1102   INR  1.1   PTP  10.7     Needs: urine analysis, urine sodium, protein and creatinine  No results found for: NAU, CREAU      Cultures:     No  results found for: SDES  Lab Results   Component Value Date/Time    Culture result: NO GROWTH AFTER 7 HOURS 09/09/2016 12:16 AM    Culture result: (A) 09/05/2016 09:39 AM     SCANT STAPHYLOCOCCUS EPIDERMIDIS (OXACILLIN RESISTANT)    Culture result: SCANT ENTEROCOCCUS FAECALIS GROUP D (A) 09/05/2016 09:39 AM    Culture result: RARE PROVIDENCIA RETTGERI (A) 09/05/2016 09:39 AM    Culture result: RARE CANDIDA ALBICANS (A) 09/05/2016 09:39 AM    Culture result: (A) 09/05/2016 09:39 AM     FEW MIXED ANAEROBES CONSISTING OF ANAEROBIC GRAM NEGATIVE RODS BETA LACTAMASE POSITIVE    Culture result: AND FEW ANAEROBIC GRAM POSITIVE COCCI (A) 09/05/2016 09:39 AM       Radiology:     Medications       Current Facility-Administered Medications   Medication Dose Route Frequency Last Dose   ??? cefTRIAXone (ROCEPHIN) 2 g in 0.9% sodium chloride (MBP/ADV) 50 mL  2 g IntraVENous Q24H 2 g at 09/08/16 1345   ??? metroNIDAZOLE (FLAGYL) tablet 500 mg  500 mg Oral Q8H 500 mg at 09/09/16 0741   ??? alteplase (CATHFLO) 1 mg in sterile water (preservative free) 1 mL injection  1 mg InterCATHeter PRN     ??? sodium chloride (NS) flush 10-30 mL  10-30 mL InterCATHeter PRN     ??? sodium chloride (NS) flush 10 mL  10 mL InterCATHeter Q24H 10 mL at 09/08/16 1545   ??? sodium chloride (NS) flush 10 mL  10 mL InterCATHeter PRN 10 mL at 09/09/16 1037   ??? sodium chloride (NS) flush 10-40 mL  10-40 mL InterCATHeter Q8H 10 mL at 09/09/16 0742   ??? sodium chloride (NS) flush 20 mL  20 mL InterCATHeter Q24H 20 mL at 09/08/16 1545   ??? acetaminophen (TYLENOL) tablet 650 mg  650 mg Oral Q6H PRN 650 mg at 09/09/16 0005    ??? fluconazole (DIFLUCAN) tablet 400 mg  400 mg Oral DAILY 400 mg at 09/09/16 1041   ??? sodium chloride (NS) flush 5-10 mL  5-10 mL IntraVENous PRN     ??? enoxaparin (LOVENOX) injection 40 mg  40 mg SubCUTAneous Q12H 40 mg at 09/09/16 1037   ??? sodium chloride (NS) flush 5-10 mL  5-10 mL IntraVENous Q8H 10 mL at 09/09/16 0824   ??? vancomycin (VANCOCIN) 2,000 mg in 0.9% sodium chloride 500 mL IVPB  2,000 mg IntraVENous Q12H 2,000 mg at 09/09/16 0827   ??? DULoxetine (CYMBALTA) capsule 60 mg  60 mg Oral DAILY 60 mg at 09/09/16 1041   ??? atorvastatin (LIPITOR) tablet 40 mg  40 mg Oral QHS 40 mg at 09/08/16 2218   ??? nebivolol (BYSTOLIC) tablet 10 mg  10 mg Oral QHS 10 mg at 09/08/16 2218   ??? lisinopril (PRINIVIL, ZESTRIL) tablet 20 mg  20 mg Oral DAILY 20 mg at 09/09/16 1041   ??? amLODIPine (NORVASC) tablet 5 mg  5 mg Oral DAILY 5 mg at 09/09/16 1041   ??? pregabalin (LYRICA) capsule 300 mg  300 mg Oral BID 300 mg at 09/09/16 1041           Case discussed with: pharmacy      Ubaldo GlassingMark Shelli Portilla, DO

## 2016-09-09 NOTE — Progress Notes (Signed)
2 scripts have been called into [facility] pharmacy upon wife's' insistence. Copy of instructions read and explained to them. Verbalized understanding Patient wants to speak with CM before discharge..Marland Kitchen

## 2016-09-09 NOTE — Discharge Summary (Signed)
[place] [address]   Office [phone]  Fax [phone]       Discharge / Transfer / Off-Service Note     Name: Elijah [name] MRN: [id number]  Sex: Male   Date of Birth: [birth year]  Age: 66 y.o.  PCP: [name], MD     Date of admission: 09/03/2016  Date of discharge/transfer: 09/09/2016    Attending physician at admission: Dr. Vashti Hey  Attending physician at discharge/transfer: Dr. Vashti Hey  Resident physician at discharge/transfer: Merla Riches, MD     Consultants during hospitalization  Dr. Valetta Close (ID)  Dr. Patrick [name] (Podiatry)     Admission diagnoses   Osteomyelitis [facility])  CELLULITIS    Recommended follow-up after discharge  ?? PCP, Podiatry     History of Present Illness  Per admitting provider, Dr Bill Salinas "Elijah [name] is a 66 y.o. male with hx of CAD, HTN, polyneuropathy, and morbid obesity who presents to the ER sent by his PCP after MRI study showed osteomyelitis. Pt has had RLE edema and sores of R foot for the past 5-6wks. This started when pt had a cut in the foot from stepping on plastic light fixture. Went to PCP a week later (07/28/16) who evaluated him and was given Augmentin for R leg cellulitis with a referral to a podiatrist for foot care and evaluation. On return visit from podiatry to PCP (5/17), pt was told by the podiatrist that he was not a surgical candidate given his neuropathy and his foot ulcer was debrided and was told to apply betadyne dail with dressing changes. Pt was prescribed 10days of doxycycline, which he completed. Yesterday (6/7), pt had another follow up with worsening findings and had MRI ordered with the positive results today. Pt endorses chronic numbness from his polyneuropathy. Pt denies fever/chills, congestion, cough, cp, sob, abdominal pain.   ??  In the ER, vital signs were remarkable for HTN (156/92). Labs were remarkable for Cr 1.43 (baseline 1.0), ESR 28, CRP 3.13 with nml CBC.  Doppler was negative at the ED. 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 Great toe oA, third metatarsal non-acute osteonecrosis. Pt was not started on abx for collection of BCx and WCx."    Hospital course  R. 2nd metatarsal??osteomyelitis s/p I&D and bone biopsy (6/10). Pt had 10 day augmentin and 10 day doxycycline at separate occasions respectively before presentation. MRI was positive for osteomyelitis. LE doppler was negative. No PAD in bilateral LE but ABI >1.3 indicating possible calcification. TBI had similar index. BCx had NG for 4d. Pt had I&D and bone biopsy by podiatry. Biopsy cx had MRSE, anaerobes, providencia rettgeri, enterococcus, and candida albicans. Pathology report had fragment of trabecular bone showing chronic inflammation with histocytes and marrow space, with active osteomyelitis not identified. Pt was on Vanc and cefepime for 4 days and switched to Vanc 2gm Q12hrs, Ceftriaxone 2gm daily, Diflucan 418m daily, and flagyl 506mQ8h per ID. PICC was placed and teaching on how to care for it was given. On day of discharge, pt had a spike of fever 100.7. BCx was drawn, CXR was negative, and it was attributed to the osteomyelitis. With pt's stable presentation, he was discharged with close follow up with PCP.   ????  HTN: Pt was continued on home??Norvasc 40m52mlisinopril 102m66AYnd bystolic 51m30ZS????  Chronic peripheral neuropathy, familial. Pt was continued on Cymbalta and Lyrica 352m BID  ????  HLD. TC 162, LDL 58, HDL 40, TG 322??on 06/22/16..Marland KitchenPt was continued on Lipitor 448mand aspirin was resumed at discharge  ????  Obesity??BMI Body mass index is 44.3 kg/(m^2). Pt is encouraged to make lifestyle modifications and further follow up outpatient.       Physical exam at discharge:    Vitals Reviewed.    General Oriented to person, place, and time and well-developed.    Appears well-nourished, no distress. Not diaphoretic.    HENT Head Normocephalic and atraumatic.   Eyes Conjunctivae are normal, no discharge. No scleral icterus.   Nose Nose normal, clear turbinates.   Oral Oropharynx is clear and moist. No oropharyngeal exudate.    Neck No thyromegaly present.  No cervical adenopathy.    Cardio Normal rate, regular rhythm. Exam reveals no gallop and no friction rub.   No murmur heard. No chest wall tenderness.    Pulmonary Effort normal and breath sounds normal. No respiratory distress.   No wheezes, no rales.    Abdominal Soft. Bowel sounds normal. No distension. No tenderness.    GU Deferred.    Extremities No edema of lower extremities. No tenderness. Distal pulses intact, R leg wrapped well with no bleeding.    Neurological Alert and oriented to person, place, and time.    Dermatology Skin is warm and dry. No rash noted. No erythema or pallor.    Psychiatric Affect and judgment normal.        Condition at discharge: Stable.    Labs  Recent Labs      09/09/16   0016  09/08/16   0425  09/07/16   0425   WBC  6.7  7.3  7.4   HGB  13.1  13.2  13.4   HCT  39.2  40.0  40.5   PLT  217  220  236     Recent Labs      09/09/16   0016  09/08/16   0425  09/07/16   0425   NA  140  140  143   K  3.9  3.8  4.0   CL  107  108  109*   CO2  '24  23  22   ' BUN  '14  13  14   ' CREA  1.16  1.22  1.12   GLU  83  83  81   CA  8.6  8.7  9.0     Recent Labs      09/09/16   0016  09/08/16   0425  09/07/16   0425   SGOT  34  20  17   ALT  '31  17  18   ' AP  62  64  64   TBILI  0.4  0.6  0.5   TP  6.7  6.5  6.8   ALB  2.6*  2.5*  2.7*   GLOB  4.1*  4.0  4.1*     Recent Labs      09/07/16   1102   INR  1.1   PTP  10.7     Cultures  ?? BCx, bone biopsy cx    Procedures / Diagnostic Studies  ?? I&D    Imaging    Results from Hospital Encounter encounter on 09/03/16   XR CHEST PORT   Narrative INDICATION: Fever    COMPARISON: September 06, 2016  FINDINGS: AP portable imaging of the chest performed at 12:17 AM  demonstrates a  stable cardiomediastinal silhouette. The patient is status post median  sternotomy and CABG. Right arm PICC line has its tip at the level of the  cavoatrial junction. The lungs are clear bilaterally. No significant osseous  abnormalities are seen.         Impression IMPRESSION: No evidence of acute cardiopulmonary process.             No results found for this or any previous visit.         No results found for this or any previous visit.         No procedure found.      Chronic diagnoses   Problem List as of 09/09/2016  Date Reviewed: 09-26-16          Codes Class Noted - Resolved    * (Principal)Osteomyelitis ([place]) ICD-10-CM: M86.9  ICD-9-CM: 730.20  09/03/2016 - Present        Polyneuropathy (Chronic) ICD-10-CM: G62.9  ICD-9-CM: 356.9  05/28/2016 - Present        Tubular adenoma of colon ICD-10-CM: D12.6  ICD-9-CM: 211.3  09/23/2015 - Present    Overview Signed 09/23/2015  4:44 PM by Mayer Camel, MD     x2 on colonoscopy 09/18/15             Morbid obesity due to excess calories ([place]) ICD-10-CM: E66.01  ICD-9-CM: 278.01  04/24/2015 - Present        Essential hypertension, benign (Chronic) ICD-10-CM: I10  ICD-9-CM: 401.1  02/21/2015 - Present    Overview Signed 08/10/2016  3:46 PM by Margarette Canada, LPN     Overview:   Qualifier: Diagnosis of   By: Rogue Bussing CMA, Jacqualynn              HLD (hyperlipidemia) (Chronic) ICD-10-CM: E78.5  ICD-9-CM: 272.4  02/21/2015 - Present        Coronary atherosclerosis of native coronary artery (Chronic) ICD-10-CM: I25.10  ICD-9-CM: 414.01  02/21/2015 - Present    Overview Addendum 08/10/2016  3:46 PM by Margarette Canada, LPN     S/p CABG x5. DOE was presenting complaint.             OSA on CPAP (Chronic) ICD-10-CM: G47.33, Z99.89  ICD-9-CM: 327.23, V46.8  02/21/2015 - Present        Pure hypercholesterolemia ICD-10-CM: E78.00  ICD-9-CM: 272.0  08/23/2013 - Present        S/P CABG (coronary artery bypass graft) ICD-10-CM: Z95.1  ICD-9-CM: V45.81  08/23/2013 - Present     Overview Signed 08/10/2016  3:46 PM by Margarette Canada, LPN     Overview:   [id number]             Benign prostatic hyperplasia ICD-10-CM: N40.0  ICD-9-CM: 600.00  12/29/2006 - Present    Overview Signed 08/10/2016  3:46 PM by Margarette Canada, LPN     Overview:   Qualifier: Diagnosis of   By: Rogue Bussing CMA, Jacqualynn                    Discharge/Transfer Medications  Discharge Medication List as of 09/09/2016 12:48 PM      CONTINUE these medications which have CHANGED    Details   cefTRIAXone 2 gram 2 g, ADDaptor 1 Device IVPB 2 g by IntraVENous route every twenty-four (24) hours for 39 days., No Print, Disp-39 Dose, R-0      fluconazole (  DIFLUCAN) 200 mg tablet Take 2 Tabs by mouth daily for 38 days. FDA advises cautious prescribing of oral fluconazole in pregnancy., Print, Disp-76 Tab, R-0      metroNIDAZOLE (FLAGYL) 500 mg tablet Take 1 Tab by mouth every eight (8) hours for 39 days., Print, Disp-117 Tab, R-0      vancomycin 10 gram 2,000 mg IVPB 2,000 mg by IntraVENous route every twelve (12) hours every twelve (12) hours for 39 days., No Print, Disp-78 Dose, R-0         CONTINUE these medications which have NOT CHANGED    Details   atorvastatin (LIPITOR) 40 mg tablet Take 40 mg by mouth nightly., Historical Med      co-enzyme Q-10 (CO Q-10) 100 mg capsule Take 100 mg by mouth nightly., Historical Med      Flaxseed Oil oil Take 1 Cap by mouth daily., Historical Med      nebivolol (BYSTOLIC) 10 mg tablet Take 10 mg by mouth nightly., Historical Med      DULoxetine (CYMBALTA) 60 mg capsule Take 60 mg by mouth daily., Historical Med      lisinopril (PRINIVIL, ZESTRIL) 20 mg tablet TAKE ONE TABLET BY MOUTH ONCE DAILY, Normal, Disp-90 Tab, R-2      amLODIPine (NORVASC) 5 mg tablet TAKE ONE TABLET BY MOUTH ONCE DAILY FOR HYPERTENSION, Normal, Disp-90 Tab, R-2      pregabalin (LYRICA) 300 mg capsule Take 1 Cap by mouth two (2) times a day. Max Daily Amount: 600 mg. Indications: NEUROPATHIC PAIN ASSOCIATED  WITH SPINAL CORD INJURY, peripheral neuropathy, Print, Disp-60 Cap, R-3      aspirin (ASPIRIN) 325 mg tablet Take 1 Tab by mouth daily., Normal, Disp-90 Tab, R-0      cpap machine kit by Does Not Apply route., Historical Med      DOCOSAHEXANOIC ACID/EPA (FISH OIL PO) Take 1 Cap by mouth daily., Historical Med              Diet:  Cardiac diet.    Activity:  As tolerated    Disposition: Home or Self Care    Discharge instructions to patient/family  Please seek medical attention for any new or worsening symptoms particularly fever, chest pain, shortness of breath, abdominal pain, nausea, vomiting    Follow up plans/appointments  Follow-up Information     Follow up With Details Comments [facility] [place]    [facility]   IV Antibiotics  [phone]     [name], MD Go on 09/15/2016 Hospital follow up @ 11:10am [address]  [place] [address]  [phone]      Charolett Bumpers, DPM Go on 09/14/2016 Appointment time is 10:30am  [address], [place] [address]  [phone]           Merla Riches, MD  Family Medicine Resident       For Billing    Chief Complaint   Patient presents with   ??? Toe Pain       Hospital Problems  Date Reviewed: 09-14-16          Codes Class Noted POA    * (Principal)Osteomyelitis ([place]) ICD-10-CM: M86.9  ICD-9-CM: 730.20  09/03/2016 Yes        Polyneuropathy (Chronic) ICD-10-CM: G62.9  ICD-9-CM: 356.9  05/28/2016 Yes        Essential hypertension, benign (Chronic) ICD-10-CM: I10  ICD-9-CM: 401.1  02/21/2015 Yes    Overview Signed 08/10/2016  3:46 PM by Margarette Canada, LPN     Overview:   Qualifier: Diagnosis of   By: Rogue Bussing CMA, Jacqualynn              Coronary atherosclerosis of native coronary artery (Chronic) ICD-10-CM: I25.10  ICD-9-CM: 414.01  02/21/2015 Yes    Overview Addendum 08/10/2016  3:46 PM by Margarette Canada, LPN     S/p CABG x5. DOE was presenting complaint.

## 2016-09-09 NOTE — Discharge Summary (Signed)
Discharge  Summary by Elijah [name] at 09/09/16 2841                Author: Merla [name]  Service: FAMILY MEDICINE  Author Type: Resident       Filed: 09/09/16 2248  Date of Service: 09/09/16 0415  Status: Attested           Editor: Elijah [name]  Cosigner: Elijah Shi, MD at 09/15/16 [phone]          Attestation signed by Elijah Shi, MD at 09/15/16 [place] [facility] Attending Addendum:   I saw and evaluated the patient, performing the key elements of the service.  I discussed the findings, assessment and plan with the resident and agree with the resident's findings and plan as documented  in the resident's note.                                                 [place] [address]    Office [phone]   Fax [phone]             Discharge / Transfer / Off-Service Note            Name: Elijah [name]  MRN: [id number]   Sex: Male     Date of Birth: [birth year]   Age: 66 y.o.   PCP: Elijah Logan, MD        Date of admission: 09/03/2016   Date of discharge/transfer:  09/09/2016      Attending physician at admission: Dr. Vashti [name]   Attending physician at discharge/transfer: Dr. Vashti [name]   Resident physician at discharge/transfer:  Elijah Riches, MD       Consultants during hospitalization   Dr. Valetta [name] (ID)   Dr. Patrick [name] (Podiatry)       Admission diagnoses    Osteomyelitis [facility])   CELLULITIS      Recommended follow-up after discharge   ??  PCP, Podiatry       History of Present Illness   Per admitting provider, Dr Elijah [name] "Elijah [name] is a 66 y.o. male with hx of CAD, HTN,  polyneuropathy, and morbid obesity who presents to the ER sent by his PCP after MRI study showed osteomyelitis. Pt has had RLE edema and sores of R foot for the past 5-6wks. This started when pt had a cut in the foot from stepping on plastic light fixture.  Went to PCP a week later (07/28/16) who evaluated him and was given  Augmentin for R leg cellulitis with a referral to a podiatrist for foot care and evaluation. On return visit from podiatry to PCP (5/17), pt was told by the podiatrist that he was not a  surgical candidate given his neuropathy and his foot ulcer was debrided and was told to apply betadyne dail with dressing changes. Pt was prescribed 10days of doxycycline, which he completed. Yesterday (6/7), pt had another follow up with worsening findings  and had MRI ordered with the positive results today. Pt endorses chronic numbness from his polyneuropathy. Pt denies fever/chills, congestion, cough, cp, sob, abdominal pain.    ??   In the ER, vital signs were remarkable for HTN (156/92). Labs were remarkable for Cr  1.43 (baseline 1.0), ESR 28,  CRP 3.13 with nml CBC. Doppler was negative at the ED. 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 Great toe oA, third metatarsal  non-acute osteonecrosis. Pt was not started on abx for collection of BCx and WCx."      Hospital course   R. 2nd metatarsal??osteomyelitis s/p I&D and bone biopsy (6/10). Pt had 10 day augmentin  and 10 day doxycycline at separate occasions respectively before presentation. MRI was positive for osteomyelitis. LE doppler was negative. No PAD in bilateral LE but ABI >1.3 indicating possible calcification. TBI had similar index. BCx had NG for 4d.  Pt had I&D and bone biopsy by podiatry. Biopsy cx had MRSE, anaerobes, Elijah [name], enterococcus, and candida albicans. Pathology report had fragment of trabecular bone showing chronic inflammation with histocytes and marrow space, with active  osteomyelitis not identified. Pt was on Elijah [name] and Elijah [name] for 4 days and switched to Elijah [name] 2gm Q12hrs, Ceftriaxone 2gm daily, Elijah [name] 466m daily, and flagyl 50376mQ8h per ID. PICC was placed and teaching on how to care for it was given. On  day of discharge,  pt had a spike of fever 100.7. BCx was drawn, CXR was negative, and it was attributed to the osteomyelitis. With pt's stable presentation, he was discharged with [name] follow up with PCP.    ????   HTN: Pt was continued on home??Norvasc 76m78mlisinopril 70m27CWnd bystolic 65m23JS ????   Chronic peripheral neuropathy, familial. Pt was continued on Cymbalta and Lyrica 300m66mD   ????   HLD. TC 162, LDL 58, HDL 40, TG 322??on 06/22/16.. PtMarland Kitchenwas continued on Lipitor 40mg70m aspirin was resumed at discharge   ????   Obesity??BMI Body mass index is 44.3 kg/(m^2). Pt is encouraged to make lifestyle modifications and further follow up outpatient.          Physical exam at discharge:         Vitals  Reviewed.      General  Oriented to person, place, and time and well-developed.    Appears well-nourished, no distress. Not diaphoretic.         HENT  Head Normocephalic and atraumatic.     Eyes Conjunctivae are normal, no discharge. No scleral icterus.    Nose Nose normal, clear turbinates.     Oral Oropharynx is clear and moist. No oropharyngeal exudate.         Neck  No thyromegaly present.  No  cervical adenopathy.      Cardio  Normal rate, regular rhythm. Exam reveals no gallop and no friction rub.    No murmur heard. No chest wall  tenderness.      Pulmonary  Effort normal and breath sounds normal. No respiratory distress.    No wheezes, no rales.      Abdominal  Soft. Bowel sounds normal. No distension. No tenderness.      GU  Deferred.      Extremities  No edema of lower extremities. No tenderness. Distal pulses intact, R leg wrapped well with no bleeding.       Neurological  Alert and oriented to person, place, and time.      Dermatology  Skin is warm and dry. No rash noted. No erythema or pallor.      Psychiatric  Affect and judgment normal.  Condition at discharge: Stable.      Labs     Recent Labs              09/09/16    0016   09/08/16    0425   09/07/16    0425     WBC   6.7   7.3   7.4     HGB    13.1   13.2   13.4     HCT   39.2   40.0   40.5          PLT   217   220   236          Recent Labs              09/09/16    0016   09/08/16    0425   09/07/16    0425     NA   140   140   143     K   3.9   3.8   4.0     CL   107   108   109*     CO2   '24   23   22     ' BUN   '14   13   14     ' CREA   1.16   1.22   1.12     GLU   83   83   81          CA   8.6   8.7   9.0          Recent Labs              09/09/16    0016   09/08/16    0425   09/07/16    0425     SGOT   34   20   17     ALT   '31   17   18     ' AP   62   64   64     TBILI   0.4   0.6   0.5     TP   6.7   6.5   6.8     ALB   2.6*   2.5*   2.7*          GLOB   4.1*   4.0   4.1*          Recent Labs            09/07/16    1102     INR   1.1        PTP   10.7        Cultures   ??  BCx, bone biopsy cx      Procedures / Diagnostic Studies   ??  I&D      Imaging        Results from Hospital Encounter encounter on 09/03/16     XR CHEST PORT          Narrative  INDICATION: Fever    COMPARISON: September 06, 2016    FINDINGS: AP portable imaging of the chest performed at 12:17 AM demonstrates a  stable  cardiomediastinal silhouette. The patient is status post median  sternotomy and CABG. Right arm PICC line has its tip at the level of the  cavoatrial junction. The lungs are clear bilaterally. No significant osseous  abnormalities are seen.  Impression  IMPRESSION: No evidence of acute cardiopulmonary process.                 No results found for this or any previous visit.          No results found for this or any previous visit.          No procedure found.         Chronic diagnoses       Problem List as of 09/09/2016   Date Reviewed:  09/28/16                Codes  Class  Noted - Resolved             * (Principal)Osteomyelitis ([place])  ICD-10-CM: M86.9   ICD-9-CM: 730.20    09/03/2016 - Present                       Polyneuropathy (Chronic)  ICD-10-CM: G62.9   ICD-9-CM: 356.9    05/28/2016 - Present                       Tubular adenoma of colon  ICD-10-CM:  D12.6   ICD-9-CM: 211.3    09/23/2015 - Present          Overview Signed 09/23/2015  4:44 PM by Mayer Camel, MD            x2 on colonoscopy 09/18/15                                   Morbid obesity due to excess calories ([place])  ICD-10-CM: E66.01   ICD-9-CM: 278.01    04/24/2015 - Present                       Essential hypertension, benign (Chronic)  ICD-10-CM: I10   ICD-9-CM: 401.1    02/21/2015 - Present          Overview Signed 08/10/2016  3:46 PM by Margarette Canada, LPN            Overview:    Qualifier: Diagnosis of    By: Rogue Bussing CMA, Jacqualynn                                    HLD (hyperlipidemia) (Chronic)  ICD-10-CM: E78.5   ICD-9-CM: 272.4    02/21/2015 - Present                       Coronary atherosclerosis of native coronary artery (Chronic)  ICD-10-CM: I25.10   ICD-9-CM: 414.01    02/21/2015 - Present          Overview Addendum 08/10/2016  3:46 PM by Margarette Canada, LPN            S/p CABG x5. DOE was presenting complaint.                                   OSA on CPAP (Chronic)  ICD-10-CM: G47.33, Z99.89   ICD-9-CM: 327.23, V46.8    02/21/2015 - Present                       Pure hypercholesterolemia  ICD-10-CM:  E78.00   ICD-9-CM: 272.0    08/23/2013 - Present                       S/P CABG (coronary artery bypass graft)  ICD-10-CM: Z95.1   ICD-9-CM: V45.81    08/23/2013 - Present          Overview Signed 08/10/2016  3:46 PM by Margarette Canada, LPN            Overview:    2009                                   Benign prostatic hyperplasia  ICD-10-CM: N40.0   ICD-9-CM: 600.00    12/29/2006 - Present          Overview Signed 08/10/2016  3:46 PM by Margarette Canada, LPN            Overview:    Qualifier: Diagnosis of    By: Rogue Bussing CMA, Jacqualynn                                       Discharge/Transfer Medications     Discharge Medication List as of 09/09/2016 12:48 PM              CONTINUE these medications which have CHANGED          Details        cefTRIAXone 2 gram 2 g, ADDaptor 1 Device IVPB  2 g by  IntraVENous route every twenty-four (24) hours for 39 days., No Print, Disp-39 Dose, R-0               fluconazole (Elijah [name]) 200 mg tablet  Take 2 Tabs by mouth daily for 38 days. FDA advises cautious prescribing of oral fluconazole in pregnancy., Print, Disp-76 Tab, R-0               metroNIDAZOLE (FLAGYL) 500 mg tablet  Take 1 Tab by mouth every eight (8) hours for 39 days., Print, Disp-117 Tab, R-0               vancomycin 10 gram 2,000 mg IVPB  2,000 mg by IntraVENous route every twelve (12) hours every twelve (12) hours for 39 days., No Print, Disp-78 Dose, R-0                     CONTINUE these medications which have NOT CHANGED          Details        atorvastatin (LIPITOR) 40 mg tablet  Take 40 mg by mouth nightly., Historical Med               co-enzyme Q-10 (CO Q-10) 100 mg capsule  Take 100 mg by mouth nightly., Historical Med               Flaxseed Oil oil  Take 1 Cap by mouth daily., Historical Med               nebivolol (BYSTOLIC) 10 mg tablet  Take 10 mg by mouth nightly., Historical Med               DULoxetine (CYMBALTA) 60 mg capsule  Take 60 mg by mouth daily., Historical Med  lisinopril (PRINIVIL, ZESTRIL) 20 mg tablet  TAKE ONE TABLET BY MOUTH ONCE DAILY, Normal, Disp-90 Tab, R-2               amLODIPine (NORVASC) 5 mg tablet  TAKE ONE TABLET BY MOUTH ONCE DAILY FOR HYPERTENSION, Normal, Disp-90 Tab, R-2               pregabalin (LYRICA) 300 mg capsule  Take 1 Cap by mouth two (2) times a day. Max Daily Amount: 600 mg. Indications: NEUROPATHIC PAIN ASSOCIATED WITH SPINAL CORD INJURY, peripheral neuropathy,  Print, Disp-60 Cap, R-3               aspirin (ASPIRIN) 325 mg tablet  Take 1 Tab by mouth daily., Normal, Disp-90 Tab, R-0               cpap machine kit  by Does Not Apply route., Historical Med               DOCOSAHEXANOIC ACID/EPA (FISH OIL PO)  Take 1 Cap by mouth daily., Historical Med                          Diet:   Cardiac diet.      Activity:  A s tolerated       Disposition: Home or Self Care      Discharge instructions to patient/family   Please seek medical attention for any new or worsening symptoms particularly fever, chest pain, shortness of breath, abdominal pain, nausea, vomiting      Follow up plans/appointments     Follow-up Information        Follow up With  Details  Comments  [facility]  [address]   [place]   [phone]             [facility]     IV Antibiotics   [phone]              Elijah Logan, MD  Go on 09/15/2016  Hospital follow up @ 11:10am  [address]   [place] [address]   [phone]                Charolett Bumpers, DPM  Go on 09/14/2016  Appointment time is 10:30am   [address], [place] [address]   [phone]                 Elijah Riches, MD   Family Medicine Resident              For Billing          Chief Complaint      Patient presents with      ?  Toe Pain               Hospital Problems   Date Reviewed:  09/06/2016                    Codes  Class  Noted  POA        * (Principal)Osteomyelitis ([place])  ICD-10-CM: M86.9   ICD-9-CM: 730.20    09/03/2016  Yes                Polyneuropathy (Chronic)  ICD-10-CM: G62.9   ICD-9-CM: 356.9    05/28/2016  Yes                Essential hypertension, benign (Chronic)  ICD-10-CM: I10   ICD-9-CM: 401.1    02/21/2015  Yes        Overview Signed 08/10/2016  3:46 PM by Margarette Canada, LPN          Overview:    Qualifier: Diagnosis of    By: Rogue Bussing CMA, Jacqualynn                          Coronary atherosclerosis of native coronary artery (Chronic)  ICD-10-CM: I25.10   ICD-9-CM: 414.01    02/21/2015  Yes        Overview Addendum 08/10/2016  3:46 PM by Margarette Canada, LPN          S/p CABG x5. DOE was presenting complaint.

## 2016-09-10 NOTE — Telephone Encounter (Signed)
Looks like he had osteomyelitis and was dc home  Call him Monday see if any cardiac concerns?

## 2016-09-10 NOTE — Progress Notes (Signed)
Hospital Discharge Follow-Up      Date/Time:  09/10/2016 11:45 AM    Patient was admitted to [facility] on 09/03/2016 and discharged on 09/09/2016 for osteomyelitis, cellulitis. The physician discharge summary was available at the time of outreach.  Patient was contacted within 1 business days of discharge.      Top Challenges reviewed with the provider   ?? Patient febrile day of discharge  ?? Follow up on blood cultures drawn day of discharge - 09/09/2016         Method of communication with provider :chart routing    Inpatient RRAT score: 65  Was this a readmission? no   Patient stated reason for the readmission: n/a    Nurse Navigator (NN) contacted the patient by telephone to perform post hospital discharge assessment. Verified name and DOB with patient as identifiers. Provided introduction to self, and explanation of the Nurse Navigator role.     Reviewed discharge instructions and red flags with patient who verbalized understanding. Patient given an opportunity to ask questions and does not have any further questions or concerns at this time. The patient agrees to contact the PCP office for questions related to their healthcare. NN provided contact information for future reference.    Disease Specific:   N/A    Summary of patient's top problems:  1. At risk for infection r/t recent dx of osteo  2. Impaired mobility r/t foot pain, cast shoe      Home Health orders at discharge: [place]: [facility]  Date of initial visit: 09/10/2016    Durable Medical Equipment ordered/company: n/a  Durable Medical Equipment received: n/a    Barriers to care? none identified    Advance Care Planning:   Does patient have an Advance Directive:  reviewed and current     Medication(s):     New Medications at Discharge: Diflucan, Flagyl, Ceftriaxone IV, Vancomycin IV  Changed Medications at Discharge: none  Discontinued Medications at Discharge: none     Medication reconciliation was performed with patient, who verbalizes understanding of administration of home medications.  There were no barriers to obtaining medications identified at this time.    Referral to Pharm D needed: no     Current Outpatient Prescriptions   Medication Sig   ??? cefTRIAXone 2 gram 2 g, ADDaptor 1 Device IVPB 2 g by IntraVENous route every twenty-four (24) hours for 39 days.   ??? fluconazole (DIFLUCAN) 200 mg tablet Take 2 Tabs by mouth daily for 38 days. FDA advises cautious prescribing of oral fluconazole in pregnancy.   ??? metroNIDAZOLE (FLAGYL) 500 mg tablet Take 1 Tab by mouth every eight (8) hours for 39 days.   ??? vancomycin 10 gram 2,000 mg IVPB 2,000 mg by IntraVENous route every twelve (12) hours every twelve (12) hours for 39 days.   ??? atorvastatin (LIPITOR) 40 mg tablet Take 40 mg by mouth nightly.   ??? co-enzyme Q-10 (CO Q-10) 100 mg capsule Take 100 mg by mouth nightly.   ??? Flaxseed Oil oil Take 1 Cap by mouth daily.   ??? nebivolol (BYSTOLIC) 10 mg tablet Take 10 mg by mouth nightly.   ??? DULoxetine (CYMBALTA) 60 mg capsule Take 60 mg by mouth daily.   ??? lisinopril (PRINIVIL, ZESTRIL) 20 mg tablet TAKE ONE TABLET BY MOUTH ONCE DAILY   ??? cpap machine kit by Does Not Apply route.   ??? amLODIPine (NORVASC) 5 mg tablet TAKE ONE TABLET BY MOUTH ONCE  DAILY FOR HYPERTENSION   ??? pregabalin (LYRICA) 300 mg capsule Take 1 Cap by mouth two (2) times a day. Max Daily Amount: 600 mg. 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 aspirin (ASPIRIN) 325 mg tablet Take 1 Tab by mouth daily.   ??? DOCOSAHEXANOIC ACID/EPA (FISH OIL PO) Take 1 Cap by mouth daily.     No current facility-administered medications for this visit.        There are no discontinued medications.    PCP/Specialist follow up: Future Appointments  Date Time Provider [place]   09/14/2016 10:30 AM [facility] ROOM 3 WOUND [place]    09/15/2016 11:10 AM [name], MD [facility] [place] SCHED   10/18/2016 2:00 PM Cam Hai, MD [place]          Goals     ??? Attends follow-up appointments as directed.            09/10/2016:  Wound care 09/14/2016  PCP 09/15/2016      ??? Supportive resources in place to maintain patient in the community (ie. Home Health, DME equipment, refer to, medication assistant plan, etc.)            09/10/2016:  HH in place with [facility] for IV abx tx.  Patient agrees to purchase thermometer to monitor temperature.      ??? Understands red flags post discharge.            09/10/2016:  Red flags reviewed with patient who verbalized understanding.  Red flags/sepsis: fever or below normal temperature (45f, chills, nausea, vomiting, diarrhea, chest pain, shortness of breath, unable to take medications, unusual sensations, and BFAST.

## 2016-09-14 ENCOUNTER — Inpatient Hospital Stay: Admit: 2016-09-14 | Payer: MEDICARE | Attending: Podiatrist | Primary: Family Medicine

## 2016-09-14 DIAGNOSIS — G629 Polyneuropathy, unspecified: Secondary | ICD-10-CM

## 2016-09-14 MED ORDER — LIDOCAINE 2 % MUCOSAL GEL
2 % | Status: DC | PRN
Start: 2016-09-14 — End: 2016-09-18
  Administered 2016-09-14: 15:00:00 via TOPICAL

## 2016-09-14 MED FILL — LIDOCAINE 2 % MUCOSAL GEL: 2 % | Qty: 30

## 2016-09-14 NOTE — Wound Image (Signed)
Patient was discharged with Self to home and was ambulatory.

## 2016-09-14 NOTE — H&P (Signed)
Wound Center  History and Physical    Subjective:     Chief Complaint:  Elijah [name] is a '@AGE'$  male who presents with Rt. foot  wound of  months duration.    Referred by:  Dr. [name]    HPI:   Wound caused by: chronic pressure/irritation due to Neuropathy  Current wound care:  Offloading wound: yes  Appetite: good  Wound associated pain: none  Diabetic: -  Smoker: -  ROS: no N/V, no T/chills; no local rash  Past Medical History:   Diagnosis Date   ??? BPH (benign prostatic hyperplasia)    ??? CAD (coronary artery disease)    ??? History of vascular access device 09/08/2016    [facility] VAT, 5 FR double, R brachial, 48 cm with 1 cm out, LTA   ??? HLD (hyperlipidemia)    ??? HTN (hypertension)    ??? Morbid obesity due to excess calories ([place]) 04/24/2015   ??? OSA (obstructive sleep apnea)    ??? Polyneuropathy    ??? Tubular adenoma of colon     x2 on colonoscopy 09/18/15      Past Surgical History:   Procedure Laterality Date   ??? HX HIP REPLACEMENT Bilateral      Family History   Problem Relation Age of Onset   ??? Stroke Mother    ??? Other Father      mrsa infxn caused death   ??? Heart Attack Brother       Social History   Substance Use Topics   ??? Smoking status: Former Smoker     Types: Cigarettes   ??? Smokeless tobacco: Never Used   ??? Alcohol use No      Comment: recently has stopped       Prior to Admission medications    Medication Sig Start Date End Date Taking? Authorizing Provider   cefTRIAXone 2 gram 2 g, ADDaptor 1 Device IVPB 2 g by IntraVENous route every twenty-four (24) hours for 39 days. 09/10/16 10/19/16  Mamie Nick, MD   fluconazole (DIFLUCAN) 200 mg tablet Take 2 Tabs by mouth daily for 38 days. FDA advises cautious prescribing of oral fluconazole in pregnancy. 09/10/16 10/18/16  Mamie Nick, MD   metroNIDAZOLE (FLAGYL) 500 mg tablet Take 1 Tab by mouth every eight (8) hours for 39 days. 09/09/16 10/18/16  Mamie Nick, MD   vancomycin 10 gram 2,000 mg IVPB 2,000 mg by IntraVENous route every  twelve (12) hours every twelve (12) hours for 39 days. 09/09/16 10/18/16  Mamie Nick, MD   atorvastatin (LIPITOR) 40 mg tablet Take 40 mg by mouth nightly.    Historical Provider   co-enzyme Q-10 (CO Q-10) 100 mg capsule Take 100 mg by mouth nightly.    Historical Provider   Flaxseed Oil oil Take 1 Cap by mouth daily.    Historical Provider   nebivolol (BYSTOLIC) 10 mg tablet Take 10 mg by mouth nightly.    Historical Provider   DULoxetine (CYMBALTA) 60 mg capsule Take 60 mg by mouth daily.    Historical Provider   lisinopril (PRINIVIL, ZESTRIL) 20 mg tablet TAKE ONE TABLET BY MOUTH ONCE DAILY 08/24/16   [name], MD   cpap machine kit by Does Not Apply route.    Historical Provider   amLODIPine (NORVASC) 5 mg tablet TAKE ONE TABLET BY MOUTH ONCE DAILY FOR HYPERTENSION 05/27/16   [name], MD   pregabalin (LYRICA) 300 mg capsule Take 1 Cap by mouth two (2) times  a day. Max Daily Amount: 600 mg. 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 06/24/15   [name], MD   [facility] ACID/EPA (FISH OIL PO) Take 1 Cap by mouth daily.    Historical Provider     Allergies   Allergen Reactions   ??? Metoprolol Other (comments)     Patient states, made me feel horrible.        Review of Systems:  A comprehensive review of systems was negative except for that written in the History of Present Illness.     Objective:     Physical Exam:      General: well developed, well nourished, pleasant , NAD. Hygiene good  Psych: cooperative. Pleasant. No anxiety or depression. Normal mood and affect.  Neuro: alert and oriented to person/place/situation. Otherwise nonfocal.  HEENT: Normocephalic, atraumatic. EOMI. Conjunctiva clear. No scleral icterus.  Neck: Normal range of motion. No masses.  Chest: Good air entry bilaterally.  Respirations nonlabored   Abdomen: Soft, nontender, nondistended, normoactive bowel sounds  Lower extremities: color normal; temperature normal. Hair growth is not present. Calves are supple, nontender, approximately equally sized in comparison. Capillary refill <3 sec  Focussed Lower Extremity Exam:  Vascular exam:  Right lower extremity: mild  edema, foot ,   DP pulse : 2+  PT pulse: 2+  Nails dystrophic     Left lower extremity: mild  edema, foot ,   DP pulse : 2+  PT pulse: 2+   Nails dystrophic    Ulcer Description:   Etiology: neuropathic  Location: Rt. foot 2nd met and dorsal 2nd toe PIJP  Measurement: in cm  Pre/post debridement 0.5 x 0.3  (probes bone, tunnels through to 2nd toe)     Ulcer bed: Mixed Granular/Fibrotic    Periwound: Calloused  Exudate: Moderate amount Serosanguinous exudate    Data Review:   No results found for this or any previous visit (from the past 24 hour(s)).    Assessment:     66 y.o. male with Rt. foot  neuropathic ulcer.  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  Cont FFT offload and PICC line.  Discussed and introduced to HBOT today.      Patient understood and agrees with plan. Questions answered.    Weekly visits and serial debridements also discussed.  Follow up with Dr. [name] in 1 week    Signed By: Charolett Bumpers, DPM     September 14, 2016     Ulcer assessment: Due to presence of necrotic tissue within the wound bed, ulcer requires debridement.    Procedure: Debridement:   The indication for debridement was reviewed with patient. Risks of procedure (bleeding, infection, pain) were discussed with patient and consent signed. Questions were answered    Subcutaneous excisional debridement   Indication: to remove necrotic tissue/ devitalized tissue/ soft eschar/ infected tissue/obtain deep wound culture through subcutaneous layer of wound bed  Consent in chart    Anesthesia: Topical 2% lidocaine jelly  Instrument: 15 Blade, Pick up   Residual Necrosis: Present and scored   Bleeding: <53m   Hemostasis: Pressure   Patient tolerated procedure well   Procedural Pain: none  Post - procedural pain: none  Post debridement measurements: see progress note.  Surface area debrided: <20 sq. cm

## 2016-09-14 NOTE — Wound Image (Signed)
09/14/16 1056   Wound Toe Right;Medial   Date First Assessed/Time First Assessed: 09/14/16 1052   POA: Yes  Wound Type: Abcess  Location: Toe  Wound Description (Optional): 2nd toe  Orientation: Right;Medial   DRESSING STATUS Clean, dry, and intact   Wound Length (cm) 1.5 cm   Wound Width (cm) 0.1 cm   Wound Depth (cm) 0.1   Wound Surface area (cm^2) 0.15 cm^2   Condition of Edges Open   Drainage Amount  None   Drainage Color Serosanguinous   Wound Odor None   Cleansing and Cleansing Agents  Normal saline   Wound Toe Right;Dorsal   Date First Assessed/Time First Assessed: 09/14/16 1053   POA: Yes  Wound Type: Abcess  Location: Toe  Wound Description (Optional): 2nd toe  Orientation: Right;Dorsal   DRESSING STATUS Clean, dry, and intact   Wound Length (cm) 0.8 cm   Wound Width (cm) 1.3 cm   Wound Depth (cm) 0.1   Wound Surface area (cm^2) 1.04 cm^2   Condition of Base Eschar   Drainage Amount  None   Wound Odor None   Cleansing and Cleansing Agents  Normal saline   Wound Foot Right;Plantar;Medial   Date First Assessed/Time First Assessed: 09/14/16 1054   POA: Yes  Wound Type: Pressure injury  Location: Foot  Orientation: Right;Plantar;Medial   DRESSING STATUS Clean, dry, and intact   Wound Length (cm) 1 cm   Wound Width (cm) 0.4 cm   Wound Depth (cm) 0.1   Wound Surface area (cm^2) 0.4 cm^2   Condition of Base Eschar   Condition of Edges Calloused   Drainage Amount  None   Wound Odor None   Cleansing and Cleansing Agents  Normal saline   Wound Foot Right;Plantar   Date First Assessed/Time First Assessed: 09/14/16 1055   POA: Yes  Wound Type: Pressure injury  Location: Foot  Orientation: Right;Plantar   DRESSING STATUS Clean, dry, and intact   Wound Length (cm) 0.4 cm   Wound Width (cm) 0.3 cm   Wound Depth (cm) 2.7   Wound Surface area (cm^2) 0.12 cm^2   Condition of Base Pink;Slough   Drainage Amount  Moderate   Drainage Color Serosanguinous   Wound Odor None   Cleansing and Cleansing Agents  Normal saline    Wound Foot Left;Lateral   Date First Assessed/Time First Assessed: 09/14/16 1055   POA: Yes  Wound Type: Pressure injury  Location: Foot  Orientation: Left;Lateral   DRESSING STATUS Clean, dry, and intact   Wound Length (cm) 2 cm   Wound Width (cm) 2.9 cm   Wound Depth (cm) 0.1   Wound Surface area (cm^2) 5.8 cm^2   Condition of Base Eschar   Condition of Edges Calloused   Drainage Amount  None   Wound Odor None   Cleansing and Cleansing Agents  Normal saline

## 2016-09-14 NOTE — Telephone Encounter (Signed)
Attempted to reach patient by telephone. A message was left for return call.

## 2016-09-15 ENCOUNTER — Ambulatory Visit: Admit: 2016-09-15 | Discharge: 2016-09-15 | Payer: MEDICARE | Attending: Family Medicine | Primary: Family Medicine

## 2016-09-15 DIAGNOSIS — M86671 Other chronic osteomyelitis, right ankle and foot: Secondary | ICD-10-CM

## 2016-09-15 LAB — CULTURE, BLOOD: Culture result:: NO GROWTH

## 2016-09-15 MED ORDER — TRAMADOL 50 MG TAB
50 mg | ORAL_TABLET | Freq: Three times a day (TID) | ORAL | 0 refills | Status: DC | PRN
Start: 2016-09-15 — End: 2016-09-20

## 2016-09-15 NOTE — Progress Notes (Signed)
1. Have you been to the ER, urgent care clinic since your last visit?  Hospitalized since your last visit?[facility]     2. Have you seen or consulted any other health care providers outside of [facility] since your last visit?  Include any pap smears or colon screening. No  Reviewed record in preparation for visit and have necessary documentation  Pt did not bring medication to office visit for review    Goals that were addressed and/or need to be completed during or after this appointment include       Health Maintenance Due   Topic Date Due   ??? FOBT Q 1 YEAR AGE 54-75  12/22/1998   ??? Pneumococcal 65+ Low/Medium Risk (1 of 2 - PCV13) 12/21/2013   ??? MEDICARE YEARLY EXAM  07/30/2016   ??? GLAUCOMA SCREENING Q2Y  10/27/2016

## 2016-09-15 NOTE — Patient Instructions (Addendum)
Neuropathic Pain: Care Instructions  Your Care Instructions    Neuropathic pain is caused by pressure on or damage to your nerves. It's often simply called nerve pain. Some people feel this type of pain all the time. For others, it comes and goes.  Diabetes, shingles, or an injury can cause nerve pain. Many people say the pain feels sharp, burning, or stabbing. But some people feel it as a dull ache. In some cases, it makes your skin very sensitive. So touch, pressure, and other sensations that did not hurt before may now cause pain.  It's important to know that this kind of pain is real and can affect your quality of life. It's also important to know that treatment can help. Treatment includes pain medicines, exercise, and physical therapy.  Medicines can help reduce the number of pain signals that travel over the nerves. This can make the painful areas less sensitive. It can also help you sleep better and improve your mood. But medicines are only one part of successful treatment.  Most people do best with more than one kind of treatment. Your doctor may recommend that you try cognitive-behavioral therapy and stress management. Or, if needed, you may decide to try to quit smoking, lower your blood pressure, or better control blood sugar. These kinds of healthy changes can also make a difference.  If you feel that your treatment is not working, talk to your doctor. And be sure to tell your doctor if you think you might be depressed or anxious. These are common problems that can also be treated.  Follow-up care is a key part of your treatment and safety. Be sure to make and go to all appointments, and call your doctor if you are having problems. It's also a good idea to know your test results and keep a list of the medicines you take.  How can you care for yourself at home?  ?? Be safe with medicines. Read and follow all instructions on the label.   ?? If the doctor gave you a prescription medicine for pain, take it as prescribed.  ?? If you are not taking a prescription pain medicine, ask your doctor if you can take an over-the-counter medicine.  ?? Save hard tasks for days when you have less pain. Follow a hard task with an easy task. And remember to take breaks.  ?? Relax, and reduce stress. You may want to try deep breathing or meditation. These can help.  ?? Keep moving. Gentle, daily exercise can help reduce pain. Your doctor or physical therapist can tell you what type of exercise is best for you. This may include walking, swimming, and stationary biking. It may also include stretches and range-of-motion exercises.  ?? Try heat, cold packs, and massage.  ?? Get enough sleep. Constant pain can make you more tired. If the pain makes it hard to sleep, talk with your doctor.  ?? Think positively. Your thoughts can affect your pain. Do fun things to distract yourself from the pain. See a movie, read a book, listen to music, or spend time with a friend.  ?? Keep a pain diary. Try to write down how strong your pain is and what it feels like. Also try to notice and write down how your moods, thoughts, sleep, activities, and medicine affect your pain. These notes can help you and your doctor find the best ways to treat your pain.  Reducing constipation caused by pain medicine  Pain medicines often cause constipation. To   reduce constipation:  ?? Include fruits, vegetables, beans, and whole grains in your diet each day. These foods are high in fiber.  ?? Drink plenty of fluids, enough so that your urine is light yellow or clear like water. If you have kidney, heart, or liver disease and have to limit fluids, talk with your doctor before you increase the amount of fluids you drink.  ?? Get some exercise every day. Build up slowly to 30 to 60 minutes a day on 5 or more days of the week.  ?? Take a fiber supplement, such as Citrucel or Metamucil, every day if  needed. Read and follow all instructions on the label.  ?? Schedule time each day for a bowel movement. Having a daily routine may help. Take your time and do not strain when having a bowel movement.  ?? Ask your doctor about a laxative. The goal is to have one easy bowel movement every 1 to 2 days. Do not let constipation go untreated for more than 3 days.  When should you call for help?  Call your doctor now or seek immediate medical care if:  ? ?? You feel sad, anxious, or hopeless for more than a few days. This could mean you are depressed. Depression is common in people who have a lot of pain. But it can be treated.   ? ?? You have trouble with bowel movements, such as:  ?? No bowel movement in 3 days.  ?? Blood in the anal area, in your stool, or on the toilet paper.  ?? Diarrhea for more than 24 hours.   ?Watch closely for changes in your health, and be sure to contact your doctor if:  ? ?? Your pain is getting worse.   ? ?? You can't sleep because of pain.   ? ?? You are very worried or anxious about your pain.   ? ?? You have trouble taking your pain medicine.   ? ?? You have any concerns about your pain medicine or its side effects.   ? ?? You have vomiting or cramps for more than 2 hours.   Where can you learn more?  Go to http://www.healthwise.net/[employer]Help[employer]ctions.  Enter L661 in the search box to learn more about "Neuropathic Pain: Care Instructions."  Current as of: January 10, 2015  Content Version: 11.4  ?? 2006-2017 [employer]. Care instructions adapted under license by [employer] (which disclaims liability or warranty for this information). If you have questions about a medical condition or this instruction, always ask your healthcare professional. [employer]wise[employer] Inc[employer] disclaims any warranty or liability for your use of this information.

## 2016-09-15 NOTE — Progress Notes (Signed)
Progress Note    Patient: Elijah [name] MRN: [id number]  SSN: [id number]    Date of Birth: 01-23-1951  Age: 66 y.o.  Sex: male        Chief Complaint   Patient presents with   ??? Hypertension   ??? Hospital Follow Up         Subjective:     Encounter Diagnoses   Name Primary?   ??? Other chronic osteomyelitis of right foot [facility]):  Receiving ceftriaxone and vancomycin IV.  He is taking metronidazole by mouth.  He saw the podiatrist yesterday.  His dressing was changed but it slides off when he takes off his cast shoe.  He was told he could walk with a cast shoe.  I removed his existing dressing and under sterile conditions put on another bandage underneath a 6 inch Ace.  This should keep the bandage from coming off when he takes his cast shoe off.  He does not have any fever, chills or sweats.  He needs a peak vancomycin level done at the office today.  Home health care is not done this yet.  His infusion finished about 15 minutes ago.  Unfortunately he has no help at home other than home health care visits.    His IV company is [employer]: Peak vancomycin level needs to be faxed to them at [phone].     Yes   ??? Polyneuropathy: He has a dense polyneuropathy on both lower legs.  His left leg has been intermittently burning at night which keeps him from sleeping.  I will have to add a low-dose of tramadol in order for him to be able to sleep.          ??? Chronic pain syndrome: This is due to his burning neuralgia pain which acts up at night.  I have written his prescription for tramadol 1 tablet 3 times a day.  The most he could take at night is 2 pills if a 1 pill does not work.  This is less likely that hydrocodone can cause respiratory suppression.        Current and past medical information:    Current Medications after this visit::   Current Outpatient Prescriptions   Medication Sig   ??? traMADol (ULTRAM) 50 mg tablet Take 1 Tab by mouth every eight (8) hours  as needed for Pain. Max Daily Amount: 150 mg. Indications: NEUROPATHIC PAIN   ??? cefTRIAXone 2 gram 2 g, ADDaptor 1 Device IVPB 2 g by IntraVENous route every twenty-four (24) hours for 39 days.   ??? fluconazole (DIFLUCAN) 200 mg tablet Take 2 Tabs by mouth daily for 38 days. FDA advises cautious prescribing of oral fluconazole in pregnancy.   ??? vancomycin 10 gram 2,000 mg IVPB 2,000 mg by IntraVENous route every twelve (12) hours every twelve (12) hours for 39 days.   ??? atorvastatin (LIPITOR) 40 mg tablet Take 40 mg by mouth nightly.   ??? co-enzyme Q-10 (CO Q-10) 100 mg capsule Take 100 mg by mouth nightly.   ??? Flaxseed Oil oil Take 1 Cap by mouth daily.   ??? nebivolol (BYSTOLIC) 10 mg tablet Take 10 mg by mouth nightly.   ??? DULoxetine (CYMBALTA) 60 mg capsule Take 60 mg by mouth daily.   ??? lisinopril (PRINIVIL, ZESTRIL) 20 mg tablet TAKE ONE TABLET BY MOUTH ONCE DAILY   ??? cpap machine kit by Does Not Apply route.   ??? amLODIPine (NORVASC) 5 mg tablet TAKE ONE TABLET BY  MOUTH ONCE DAILY FOR HYPERTENSION   ??? pregabalin (LYRICA) 300 mg capsule Take 1 Cap by mouth two (2) times a day. Max Daily Amount: 600 mg. 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 aspirin (ASPIRIN) 325 mg tablet Take 1 Tab by mouth daily.   ??? DOCOSAHEXANOIC ACID/EPA (FISH OIL PO) Take 1 Cap by mouth daily.   ??? metroNIDAZOLE (FLAGYL) 500 mg tablet Take 1 Tab by mouth every eight (8) hours for 39 days.     No current facility-administered medications for this visit.      Facility-Administered Medications Ordered in Other Visits   Medication Dose Route Frequency   ??? lidocaine (XYLOCAINE) 2 % jelly   Topical PRN       Patient Active Problem List    Diagnosis Date Noted   ??? Osteomyelitis ([place]) 09/03/2016   ??? Polyneuropathy 05/28/2016   ??? Tubular adenoma of colon 09/23/2015   ??? Morbid obesity due to excess calories ([place]) 04/24/2015   ??? Essential hypertension, benign 02/21/2015    ??? HLD (hyperlipidemia) 02/21/2015   ??? Coronary atherosclerosis of native coronary artery 02/21/2015   ??? OSA on CPAP 02/21/2015   ??? Pure hypercholesterolemia 08/23/2013   ??? 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 Benign prostatic hyperplasia 12/29/2006       Past Medical History:   Diagnosis Date   ??? BPH (benign prostatic hyperplasia)    ??? CAD (coronary artery disease)    ??? History of vascular access device 09/08/2016    [facility] VAT, 5 FR double, R brachial, 48 cm with 1 cm out, LTA   ??? HLD (hyperlipidemia)    ??? HTN (hypertension)    ??? Morbid obesity due to excess calories ([place]) 04/24/2015   ??? OSA (obstructive sleep apnea)    ??? Polyneuropathy    ??? Tubular adenoma of colon     x2 on colonoscopy 09/18/15       Allergies   Allergen Reactions   ??? Metoprolol Other (comments)     Patient states, made me feel horrible.       Past Surgical History:   Procedure Laterality Date   ??? HX HIP REPLACEMENT Bilateral        Social History     Social History   ??? Marital status: MARRIED     Spouse name: N/A   ??? Number of children: N/A   ??? Years of education: N/A     Social History Main Topics   ??? Smoking status: Former Smoker     Types: Cigarettes   ??? Smokeless tobacco: Never Used   ??? Alcohol use No      Comment: recently has stopped   ??? Drug use: No   ??? Sexual activity: Not Asked     Other Topics Concern   ??? None     Social History Narrative       Review of Systems   Constitutional: Negative.  Negative for chills, fever, malaise/fatigue and weight loss.   HENT: Negative.  Negative for hearing loss.    Eyes: Negative.  Negative for blurred vision and double vision.   Respiratory: Negative.  Negative for cough, hemoptysis, sputum production and shortness of breath.    Cardiovascular: Negative.  Negative for chest pain, palpitations and orthopnea.   Gastrointestinal: Negative.  Negative for abdominal pain, blood in stool, heartburn, nausea and vomiting.   Genitourinary: Negative.  Negative for dysuria, frequency and urgency.    Musculoskeletal: Negative.  Negative for back pain, myalgias and neck pain.   Skin: Negative.  Negative for rash.   Neurological: Negative.  Negative for dizziness, tingling, tremors, weakness and headaches.   Endo/Heme/Allergies: Negative.    Psychiatric/Behavioral: Negative.  Negative for depression.     Objective:     Vitals:    09/15/16 1123   BP: 108/72   Pulse: 63   Resp: 24   Temp: 98.3 ??F (36.8 ??C)   TempSrc: Oral   SpO2: 96%   Weight: 341 lb 12.8 oz (155 kg)   Height: '6\' 1"'  (1.854 m)      Body mass index is 45.1 kg/(m^2).    Physical Exam   Constitutional: He is oriented to person, place, and time and well-developed, well-nourished, and in no distress.   HENT:   Head: Normocephalic and atraumatic.   Mouth/Throat: Oropharynx is clear and moist.   Eyes: Right eye exhibits no discharge. Left eye exhibits no discharge. No scleral icterus.   Neck: No tracheal deviation present. No thyromegaly present.   .   Cardiovascular: Normal rate, regular rhythm and normal heart sounds.    Pulmonary/Chest: Effort normal and breath sounds normal.   Abdominal: Soft.   Neurological: He is alert and oriented to person, place, and time.   Skin: No rash noted. No erythema.   Is right foot is less erythematous than it was when I saw him before his treatment.  He has a packing in the ulceration beneath his second third toes.  This was left intact. He also has a superficial wound on his left lower leg from where he is using the [employer]well tens unit.  This was also cleansed and dressed while he was here.   Psychiatric: Mood and affect normal.   Nursing note and vitals reviewed.    Approximately 40 minutes were spent with the patient examining, talking with him and redressing his wound.  Assessment and orders:     Encounter Diagnoses     ICD-10-CM ICD-9-CM   1. Other chronic osteomyelitis of right foot ([place]) M86.671 730.17   2. Polyneuropathy G62.9 356.9   3. Chronic pain syndrome G89.4 338.4     Diagnoses and all orders for this visit:     1. Other chronic osteomyelitis of right foot ([facility])  -     CBC WITH AUTOMATED DIFF  -     METABOLIC PANEL, COMPREHENSIVE  -     URINALYSIS W/ RFLX MICROSCOPIC        -     Vancomycin level was drawn today.  A direct order could not be put in.  This had to be done by hand.      2. Polyneuropathy  -     CBC WITH AUTOMATED DIFF  -     METABOLIC PANEL, COMPREHENSIVE  -     traMADol (ULTRAM) 50 mg tablet; Take 1 Tab by mouth every eight (8) hours as needed for Pain. Max Daily Amount: 150 mg. Indications: NEUROPATHIC PAIN    3. Chronic pain syndrome  -     traMADol (ULTRAM) 50 mg tablet; Take 1 Tab by mouth every eight (8) hours as needed for Pain. Max Daily Amount: 150 mg. Indications: NEUROPATHIC PAIN    Plan of care:  Discussed diagnoses in detail with patient.     Medication risks/benefits/side effects discussed with patient.     All of the patient's questions were addressed. The patient understands and agrees with our plan of care.    The patient  knows to call back if they are unsure of or forget any changes we discussed today or if the symptoms change.     The patient received an After-Visit Summary which contains VS, orders, medication list and allergy list. This can be used as a "mini-medical record" should they have to seek medical care while out of town.    Patient Care Team:  [name], MD as PCP - General (Family Practice)  Cammy Copa, MD (Cardiology)  Verdene Lennert, MD (Orthopedic Surgery)  Raynaldo Opitz. Dema Severin, MD (Orthopedic Surgery)  Haynes Dage, RN as Ambulatory Care Navigator [facility])    Follow-up Disposition:  Return in about 2 weeks (around 09/29/2016).    Future Appointments  Date Time Provider [place]   09/21/2016 3:30 PM [place]   10/18/2016 2:00 PM Cam Hai, MD [place]       Signed By: [name], MD     September 15, 2016

## 2016-09-16 ENCOUNTER — Encounter

## 2016-09-16 LAB — URINALYSIS W/ RFLX MICROSCOPIC
Bilirubin: NEGATIVE
Blood: NEGATIVE
Glucose: NEGATIVE
Ketone: NEGATIVE
Leukocyte Esterase: NEGATIVE
Nitrites: NEGATIVE
Specific Gravity: >=1.03 — AB (ref 1.005–1.030)
Urobilinogen: 0.2 mg/dL (ref 0.2–1.0)
pH (UA): 5.5 (ref 5.0–7.5)

## 2016-09-16 LAB — CBC WITH AUTOMATED DIFF
ABS. BASOPHILS: 0.1 10*3/uL (ref 0.0–0.2)
ABS. EOSINOPHILS: 0.3 10*3/uL (ref 0.0–0.4)
ABS. IMM. GRANS.: 0 10*3/uL (ref 0.0–0.1)
ABS. MONOCYTES: 0.8 10*3/uL (ref 0.1–0.9)
ABS. NEUTROPHILS: 3.5 10*3/uL (ref 1.4–7.0)
Abs Lymphocytes: 1.3 10*3/uL (ref 0.7–3.1)
BASOPHILS: 2 %
EOSINOPHILS: 5 %
HCT: 41.3 % (ref 37.5–51.0)
HGB: 14.1 g/dL (ref 13.0–17.7)
IMMATURE GRANULOCYTES: 0 %
Lymphocytes: 21 %
MCH: 30.9 pg (ref 26.6–33.0)
MCHC: 34.1 g/dL (ref 31.5–35.7)
MCV: 90 fL (ref 79–97)
MONOCYTES: 14 %
NEUTROPHILS: 58 %
PLATELET: 317 10*3/uL (ref 150–379)
RBC: 4.57 x10E6/uL (ref 4.14–5.80)
RDW: 14.5 % (ref 12.3–15.4)
WBC: 6 10*3/uL (ref 3.4–10.8)

## 2016-09-16 LAB — VANCOMYCIN, PEAK: Vancomycin Peak, Serum: 44 ug/mL — CR (ref 25.0–40.0)

## 2016-09-16 LAB — METABOLIC PANEL, COMPREHENSIVE
A-G Ratio: 1.2 (ref 1.2–2.2)
ALT (SGPT): 99 IU/L — ABNORMAL HIGH (ref 0–44)
AST (SGOT): 67 IU/L — ABNORMAL HIGH (ref 0–40)
Albumin: 3.8 g/dL (ref 3.6–4.8)
Alk. phosphatase: 65 IU/L (ref 39–117)
BUN/Creatinine ratio: 17 (ref 10–24)
BUN: 20 mg/dL (ref 8–27)
Bilirubin, total: 0.3 mg/dL (ref 0.0–1.2)
CO2: 19 mmol/L — ABNORMAL LOW (ref 20–29)
Calcium: 9.2 mg/dL (ref 8.6–10.2)
Chloride: 109 mmol/L — ABNORMAL HIGH (ref 96–106)
Creatinine: 1.2 mg/dL (ref 0.76–1.27)
GFR est AA: 72 mL/min/{1.73_m2} (ref >59)
GFR est non-AA: 62 mL/min/{1.73_m2} (ref >59)
GLOBULIN, TOTAL: 3.1 g/dL (ref 1.5–4.5)
Glucose: 63 mg/dL — ABNORMAL LOW (ref 65–99)
Potassium: 4.4 mmol/L (ref 3.5–5.2)
Protein, total: 6.9 g/dL (ref 6.0–8.5)
Sodium: 143 mmol/L (ref 134–144)

## 2016-09-16 NOTE — Telephone Encounter (Signed)
-----   Message from Thom ChimesElizabeth B Borum, LPN sent at 1/61/09606/21/2018  2:18 PM EDT -----  Regarding: FW: Please call patient.      ----- Message -----     From: Jacelyn GripSteven N Spence, MD     Sent: 09/16/2016   1:56 PM       To: Thom ChimesElizabeth B Borum, LPN  Subject: Please call patient.                             -Hold his atorvastatin-his cholesterol medicine until further notice due to slight elevation of his liver function tests.  If his liver function test are higher on Monday I will have to decrease the dose of his fluconazole.  Thank you,  Dr. Mliss SaxSpence

## 2016-09-16 NOTE — Telephone Encounter (Signed)
Elijah [name], with the infusion supply, called the office in regard to pt.     Per Elijah [name] pt had Vancomycin labs drawn, yesterday, and based on results Dr. Germaine PomfretGentz may change the dosage. If dosage needs to be change, please send new RX.     Results are in connect care.     Elijah [name]- [phone]- [phone]

## 2016-09-16 NOTE — Telephone Encounter (Signed)
D/w pharmacy

## 2016-09-16 NOTE — Telephone Encounter (Signed)
Elijah [name] called from home health [facility]. States she can not draw labs off PICC line today. Advised per Dr. Mliss SaxSpence to add CRP to lab test performed yesterday.      [facility] called and CRP added.   Shawna OrleansMelanie called back and is aware. She states she will attempt to draw again next week as scheduled.

## 2016-09-16 NOTE — Progress Notes (Signed)
I called and discussed his labs from yesterday.  His peak vancomycin is slightly high.  He also has mild elevations in his ALT and AST.  He will discuss with the infusion company how he can cut back his dose by 20% until they get new vancomycin to him.  He described a delivery system for vancomycin that I am not familiar with - it does not appear he has the capability to stop the current dose 20% short.  He also said a home health care nurses is coming this morning to check the trough level for his vancomycin.  I will put in orders for him to have his liver function test rechecked on Monday.  He is to call me if he has any other questions or problems.    He got a good night sleep last night after taking tramadol.  He sounds much more rested this morning.    Dr. Mliss SaxSpence

## 2016-09-16 NOTE — Progress Notes (Signed)
Handicap parking pass been completed and left upfront.  I have also held his atorvastatin until I can repeat his liver function test on Monday.  His dose of fluconazole may have to be decreased.

## 2016-09-16 NOTE — Telephone Encounter (Signed)
Called and spoke to patient. Advised per Dr. Mliss SaxSpence:     Hold his atorvastatin-his cholesterol medicine until further notice due to slight elevation of his liver function tests. ??If his liver function test are higher on Monday I will have to decrease the dose of his fluconazole.         Pt verbalized understanding.

## 2016-09-17 ENCOUNTER — Telehealth

## 2016-09-17 NOTE — Telephone Encounter (Signed)
Patient called and stated that he will be leaving the area to go to [place] to be with his wife who can help take care of him.  He is currently receiving home health and needs us to put in the order for a group to take care of him when he goes up there.  He gave a list of names and numbers for reference.  Please order.

## 2016-09-17 NOTE — Telephone Encounter (Signed)
I put in the consult but do not know what to recommend.  His house down here is in the process of being remodeled and he certainly could use help with all that he is going through.  He is still not sleeping well due to his neuropathy.  It appears I will have to increase his narcotic back to hydrocodone to get him pain relief at night.

## 2016-09-17 NOTE — Telephone Encounter (Signed)
Verified patient with two types of identifiers.  Patient reports that he has no cardiac concerns from recent hospital visit. [facility] Verbalizes understanding. And will call with any questions or concerns.

## 2016-09-17 NOTE — Telephone Encounter (Signed)
Patient stated that he was returning Dr Clois DupesSab's call. Patient also has some questions regarding Home Health.  Phone [phone]  Judeth CornfieldStephanie

## 2016-09-19 ENCOUNTER — Encounter

## 2016-09-20 ENCOUNTER — Ambulatory Visit: Admit: 2016-09-20 | Discharge: 2016-09-20 | Payer: MEDICARE | Attending: Family Medicine | Primary: Family Medicine

## 2016-09-20 ENCOUNTER — Encounter

## 2016-09-20 DIAGNOSIS — M86671 Other chronic osteomyelitis, right ankle and foot: Secondary | ICD-10-CM

## 2016-09-20 MED ORDER — HYDROCODONE-ACETAMINOPHEN 7.5 MG-300 MG TAB
ORAL_TABLET | Freq: Every evening | ORAL | 0 refills | Status: DC | PRN
Start: 2016-09-20 — End: 2017-03-20

## 2016-09-20 NOTE — Telephone Encounter (Signed)
-----   Message from Markus DaftShequita M Chavis sent at 09/20/2016  1:30 PM EDT -----  Regarding: Dr. Patrick JupiterSpence/Refill  Rebecca, [facility], is requesting a call regarding his Hydrocodone 7 ?? 325mg . Best contact [phone] (f) [phone].

## 2016-09-20 NOTE — Progress Notes (Signed)
Chief Complaint   Patient presents with   ??? Foot Injury     Right      1. Have you been to the ER, urgent care clinic since your last visit?  Hospitalized since your last visit?No    2. Have you seen or consulted any other health care providers outside of the [facility] since your last visit?  Include any pap smears or colon screening. No    Reviewed record in preparation for visit and have necessary documentation  Pt did not bring medication to office visit for review    Goals that were addressed and/or need to be completed after this appointment include:     Health Maintenance Due   Topic Date Due   ??? FOBT Q 1 YEAR AGE 30-75  12/22/1998   ??? Pneumococcal 65+ Low/Medium Risk (1 of 2 - PCV13) 12/21/2013   ??? MEDICARE YEARLY EXAM  07/30/2016   ??? GLAUCOMA SCREENING Q2Y  10/27/2016

## 2016-09-20 NOTE — Telephone Encounter (Signed)
Returned phone call to Elijah [name] at [facility]. Advised her per Dr. Mliss SaxSpence: It is okay to use 7.5 mg - 325 mg in place of 7.5 mg - 300 mg since [facility] does not carry 300 mg. Elijah [name] verbalized understanding.

## 2016-09-20 NOTE — Patient Instructions (Signed)
Patient was advised to check his temperature at home twice daily.  Is also advised to  elevate both lower legs to reduce his edema.  He is to call if he has shaking chills or recurrent fever.
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
## 2016-09-21 ENCOUNTER — Inpatient Hospital Stay: Admit: 2016-09-21 | Payer: MEDICARE | Attending: Foot & Ankle Surgery | Primary: Family Medicine

## 2016-09-21 ENCOUNTER — Encounter

## 2016-09-21 ENCOUNTER — Encounter: Attending: Podiatrist | Primary: Family Medicine

## 2016-09-21 ENCOUNTER — Encounter: Payer: MEDICARE | Attending: Foot & Ankle Surgery | Primary: Family Medicine

## 2016-09-21 LAB — CBC WITH AUTOMATED DIFF
ABS. BASOPHILS: 0.1 10*3/uL (ref 0.0–0.2)
ABS. EOSINOPHILS: 0.4 10*3/uL (ref 0.0–0.4)
ABS. IMM. GRANS.: 0 10*3/uL (ref 0.0–0.1)
ABS. MONOCYTES: 0.8 10*3/uL (ref 0.1–0.9)
ABS. NEUTROPHILS: 2.8 10*3/uL (ref 1.4–7.0)
Abs Lymphocytes: 1.1 10*3/uL (ref 0.7–3.1)
BASOPHILS: 2 %
EOSINOPHILS: 7 %
HCT: 40.4 % (ref 37.5–51.0)
HGB: 13.5 g/dL (ref 13.0–17.7)
IMMATURE GRANULOCYTES: 0 %
Lymphocytes: 22 %
MCH: 29.8 pg (ref 26.6–33.0)
MCHC: 33.4 g/dL (ref 31.5–35.7)
MCV: 89 fL (ref 79–97)
MONOCYTES: 16 %
NEUTROPHILS: 53 %
PLATELET: 261 10*3/uL (ref 150–379)
RBC: 4.53 x10E6/uL (ref 4.14–5.80)
RDW: 15.1 % (ref 12.3–15.4)
WBC: 5.2 10*3/uL (ref 3.4–10.8)

## 2016-09-21 LAB — HEPATIC FUNCTION PANEL
ALT (SGPT): 41 IU/L (ref 0–44)
AST (SGOT): 28 IU/L (ref 0–40)
Albumin: 3.6 g/dL (ref 3.6–4.8)
Alk. phosphatase: 60 IU/L (ref 39–117)
Bilirubin, direct: 0.08 mg/dL (ref 0.00–0.40)
Bilirubin, total: 0.2 mg/dL (ref 0.0–1.2)
Protein, total: 6.5 g/dL (ref 6.0–8.5)

## 2016-09-21 LAB — METABOLIC PANEL, BASIC
BUN/Creatinine ratio: 15 (ref 10–24)
BUN: 16 mg/dL (ref 8–27)
CO2: 20 mmol/L (ref 20–29)
Calcium: 8.9 mg/dL (ref 8.6–10.2)
Chloride: 109 mmol/L — ABNORMAL HIGH (ref 96–106)
Creatinine: 1.05 mg/dL (ref 0.76–1.27)
GFR est AA: 84 mL/min/{1.73_m2} (ref >59)
GFR est non-AA: 73 mL/min/{1.73_m2} (ref >59)
Glucose: 84 mg/dL (ref 65–99)
Potassium: 4.3 mmol/L (ref 3.5–5.2)
Sodium: 141 mmol/L (ref 134–144)

## 2016-09-21 LAB — VANCOMYCIN, PEAK: Vancomycin Peak, Serum: 33 ug/mL (ref 25.0–40.0)

## 2016-09-21 LAB — CRP, HIGH SENSITIVITY: C-Reactive Protein, Cardiac: 0.81 mg/L (ref 0.00–3.00)

## 2016-09-21 MED ORDER — BYSTOLIC 10 MG TABLET
10 mg | ORAL_TABLET | ORAL | 1 refills | Status: DC
Start: 2016-09-21 — End: 2017-03-15

## 2016-09-21 MED ORDER — LISINOPRIL 20 MG TAB
20 mg | ORAL_TABLET | ORAL | 1 refills | Status: DC
Start: 2016-09-21 — End: 2017-03-15

## 2016-09-21 MED ORDER — AMLODIPINE 5 MG TAB
5 mg | ORAL_TABLET | ORAL | 1 refills | Status: DC
Start: 2016-09-21 — End: 2017-02-08

## 2016-09-21 MED ORDER — PREGABALIN 300 MG CAP
300 mg | ORAL_CAPSULE | Freq: Two times a day (BID) | ORAL | 3 refills | Status: DC
Start: 2016-09-21 — End: 2016-11-15

## 2016-09-21 NOTE — Telephone Encounter (Signed)
-----   Message from Jacelyn GripSteven N Spence, MD sent at 09/21/2016  9:46 AM EDT -----  Please call patient on his cell phone.  All of his labs are normal and his CRP has come back down to normal.  Good news!  Thank you,  Dr. Mliss SaxSpence

## 2016-09-21 NOTE — Telephone Encounter (Signed)
Pt called requesting to speak with Dr. Mliss SaxSpence. He states that he is a friend of his. I advised pt that I would send a message to the nurse. He then asked to speak with nurse. I advised pt that a message would be sent to nurse and he would be contacted soon.

## 2016-09-21 NOTE — Telephone Encounter (Signed)
CSC on file  LOV and labs 6/25
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
## 2016-09-21 NOTE — Telephone Encounter (Signed)
Called and spoke to patient. Advised per Dr. Mliss SaxSpence:    ??All of his labs are normal and his CRP has come back down to normal. ??Good news!     Pt verbalized understanding.

## 2016-09-21 NOTE — Progress Notes (Signed)
Please call patient on his cell phone.  All of his labs are normal and his CRP has come back down to normal.  Good news!  Thank you,  Dr. Mliss SaxSpence

## 2016-09-22 NOTE — Telephone Encounter (Signed)
[name] Was told she will return call.

## 2016-09-22 NOTE — Telephone Encounter (Signed)
Called again to [name] and this time spoke to [facility] [name]. Confirmed that faxes were not going through and she gave an email. Face to face emailed by Marylene LandAngela (referral cord. ) to [name]    She states home health will see pt tomorrow.    Call pt. He is aware of situation.

## 2016-09-22 NOTE — Telephone Encounter (Signed)
Called and spoke to patient for an update on how is he doing.  Patient reports stitches were removed by nurse yesterday in podiatry office. He still reports he is concerned of how his leg is doing and unpleased with care of podiatrist. Pt was instructed yesterday to find a podiatrist of choice in [place] were he has traveled and notify up with names and doctor to update Dr. Mliss SaxSpence.   Pt reports he has not yet seeked a new podiatrist but he will today.    Pt was advised that Marylene Landngela was working on having him home health care provided.     He also states that he was unable to sleep well last night due to pain and states he waited until about 1am to take pain med because he was trying to deal with pain. Pt was advised if he is having pain at night to take the pain medication prescribed and the importance is rest/sleep.     Pt verbalized understanding.

## 2016-09-22 NOTE — Telephone Encounter (Signed)
Returned called to patient- advised pt per Dr. Mliss SaxSpence:  If he has any acute issues he should go to the ER there to get evaluated and referred.       Pt verbalized understanding.

## 2016-09-22 NOTE — Telephone Encounter (Signed)
Jane with [facility] is requesting a call back from the nurse. She states patient currently has home health but he is moving to [place] and the home health agency does not service that far. Please advise [phone]

## 2016-09-22 NOTE — Telephone Encounter (Signed)
Called and spoke to [place] with [facility]. Erskine SquibbJane states she has Mr. Apolinar JunesBrandon approved with [name] to start on Monday. Dala Dockdvised Jane that Dr. Mliss SaxSpence does not want patient to wait until Monday to start. [name] contact number and fax number given to [place]. [name] has all of patients info except a face to face is needed. Angela-referral coordinator is aware.

## 2016-09-22 NOTE — Telephone Encounter (Signed)
Donn PieriniBlessy Thomas with [name] called requesting to speak with Florentina AddisonKatie to discuss a face to face assessment to start pt on home health. Please advise     (P) [phone]  (F) [phone]

## 2016-09-23 ENCOUNTER — Encounter

## 2016-09-23 NOTE — Progress Notes (Signed)
The patient is gone home to [place]. We had difficulty getting a home health care company to go see him in that we had to fax information on 3 separate occasions which would not go through.    1.  I talked to him about his visit to [facility] .  He said a podiatrist that did not know his case came in to see him.  He says he did not know he was already on a course of 6 weeks of antibiotics.  He is very upset by this and asked to see another podiatrist and he said no other podiatrist actually came in.  After that he left and went to [place].  2.  He says he has a new podiatrist appointment tomorrow in [place]- he hopes for a better outcome for that visit.  I told him if the podiatrist could not handle his problem to get a recommendation from him on who could in that area so that he can be referred to the person recommended.  He is still very anxious about losing his foot and knowing whether or not the antibiotics are actually helping his foot.  He will discuss with his podiatrist whether or not he needs another MRI or other test to see if his foot is healing properly.  His CRP did return to normal.  3.  He ran out of his Lyrica 3-4 days ago and his nighttime neuropathy pain has been horrible.  We mailed him a prescription not realizing that he had already run out so we have called his Lyrica to the designated drugstore in [place].  Because he has not been sleeping well he is absolutely exhausted.  4.  He is at the point of being overwhelmed by all of this and I told him if she continues to feel this badly to go to the emergency room at his preferred hospital in [place] and asked to be admitted for further care.  This undoubtedly has been the toughest medical issue that he has had to go through in his life.  Thank you,  Dr. Mliss SaxSpence

## 2016-09-23 NOTE — Telephone Encounter (Signed)
Face to Face refaxed today to [name]

## 2016-09-23 NOTE — Telephone Encounter (Signed)
Elijah HashimotoPatricia with [name] called stating they did not receive face to face sheet via email yesterday. I verified the email address and the correct email is [name]. Please advise     [phone]

## 2016-09-24 LAB — C REACTIVE PROTEIN, QT: C-Reactive Protein, Qt: 5.3 mg/L — ABNORMAL HIGH (ref 0.0–4.9)

## 2016-09-24 LAB — SPECIMEN STATUS REPORT

## 2016-09-24 NOTE — Progress Notes (Signed)
Call the patient to follow-up after his podiatry appointment today.  He said that the podiatry visit went well and he has confidence in this podiatrist.  Return to [place].  The podiatrist mentioned to him that he might recommend placenta treatment to his wound if he can get it approved.  He also recultured his wound and x-ray and thought that he was doing satisfactorily.  He did not see any bone infection on the x-ray.  Recall that his infection only showed up on MRI previously.  He will see the podiatrist back next week.  He is afebrile and tolerating his medications well.  Is much happier now that he has the support of his wife at home.    He also said that he slept last night after taking the Lyrica.  He still insists that the pain only occurs when he is lying down.  I asked him to please find a new neurologist in [place] while he is there to be reevaluated for his neuropathy and possibly pinched nerve in his back.    Thank you,  Dr. Mliss SaxSpence

## 2016-09-27 NOTE — Telephone Encounter (Signed)
States was told to see Dr Germaine PomfretGentz in 2 weeks, no appointment available until 10/20/16. Patient is in [place] and  wants to know if Dr Germaine PomfretGentz can take care of his issues over the phone instead of driving all the way here? Please call [phone]

## 2016-09-28 ENCOUNTER — Encounter: Attending: Podiatrist | Primary: Family Medicine

## 2016-09-28 NOTE — Telephone Encounter (Signed)
Called and spoke with pt, and he has been advised and states understanding of this.     Pt has been scheduled for 10/20/2016.

## 2016-09-28 NOTE — Progress Notes (Signed)
I called to check on patient's progress.  He is feeling better with no fevers or chills.  He is past the midpoint of his 6 weeks of antibiotics.  He says the home health care nurse reports that his wounds look better with no drainage from his foot.  He will see the podiatrist this Friday for another evaluation and perhaps a repeat MRI.  He would like for his last MRI on disc to be sent to his home.  I have asked my nurse to contact medical records to have that done.  The nurse is coming tomorrow to draw his weekly labs.  Hopefully they will have those results back by Thursday.  He is sleeping much better.  He sounded rested over the phone and in good spirits.  He will call me if he has any questions or needs.  Thank you,  Dr. Mliss SaxSpence

## 2016-09-28 NOTE — Telephone Encounter (Signed)
Please schedule for 7/25.  Need to determine if antibiotics need to be extended.  This cannot be done over phone.  Called [facility] to extend abx through 10/20/16.  [phone]

## 2016-10-01 NOTE — Telephone Encounter (Signed)
Phone call to [facility] ([name] [phone]) to request recent labs. Spoke with Liborio NixonJanice who reports that the Intake Office does not open until 9AM. She agreed to pass the message along for patients recent labs to be faxed to [phone].

## 2016-10-01 NOTE — Telephone Encounter (Signed)
Call placed to [name]) requesting a copy of patients recent labs. Spoke with a staff member who reported that she would put in a message and send it to the nurse.

## 2016-10-04 NOTE — Progress Notes (Signed)
The patient will Friday after his podiatry appointment.  He said he was a little down in that the podiatrist still thinks the wound is present and not much improved.  He told me that it was deep and goes one from the bottom of his foot to his toe.    His placenta treatment was not approved.  He said this was because he does not have diabetes causing his neuropathy.  He also said the doctor Germaine PomfretGentz for follow-up.  Dr. Germaine PomfretGentz is the infectious disease doctor that [place]    He is supposed to follow-up with Dr. Germaine PomfretGentz to determine whether extended antibiotic therapy is required.  According to the chart he has an appointment to see Dr. Germaine PomfretGentz on 10/20/2016.   I was not able to get his labs on Friday afternoon but did not get them this morning.  His trough vancomycin level is slightly high.  I am sure that Dr. Germaine PomfretGentz will adjust his dose through the [facility]  the antibiotic company. C-reactive protein is still high at 9.2. His BUN and AST are normal and  his CBC is normal.  Called the patient today was sent to his voicemail.

## 2016-10-04 NOTE — Progress Notes (Signed)
Patient called back.  I reviewed his labs.  He still confused by the conflicting opinions he is gotten regarding his best course of action now.  Apparently the podiatrist in [place] is recommending a surgical procedure to his Achilles tendon and not hyperbaric oxygen.  He he is probably going to seek a third podiatry or new orthopedic opinion.

## 2016-10-06 NOTE — Telephone Encounter (Signed)
Pt requesting call back to advise if he can be seen sooner than 10/20/16 with Dr. Germaine PomfretGentz due to having to go out of the country 10[place]21/16 and wants to make sure everything is taken care of with his foot prior to leaving.  Pt contact phone number is [phone]. Ldm

## 2016-10-07 NOTE — Telephone Encounter (Signed)
Pt called again today asking for an appointment sooner than his scheduled 10/20/16. He is leaving to go out of the country 10[place]18/16

## 2016-10-08 NOTE — Telephone Encounter (Signed)
Patient called requesting that we fax his labs to his Podiatrist, Dr. Leslie AndreaJohn Steven [name] (Fax: [phone]) and a copy to himself as well [phone]). Patient reports that he has an appointment on 10/12/16 to see his Podiatrist. Labs faxed to both numbers above.

## 2016-10-08 NOTE — Telephone Encounter (Signed)
Discussed at length with patient.  CRP is followed for trend, not acute management of infection.  Wound followed by podiatry and is stable per patient.  Will see patient in office next week and discuss options.  No abx changes at this time

## 2016-10-08 NOTE — Telephone Encounter (Signed)
Received phone call from patient. Patient states that he just found out today that Dr. Germaine PomfretGentz, his Infectious Disease doctor has not been receiving his labs. He reports that his CRP has tripled within the past month and his abx has not been changed. He insisted on talking with Dr. Mliss SaxSpence personally. Advised patient that Dr. Mliss SaxSpence is seeing patients but I would give him the message.  Advised patient that we have called his home health company, [name] numerous times for lab results and are told that they are sending them to Infectious Disease office. Advised patient that this nurse will call [facility] to have his recent CRP faxed to [facility] so we can personally fax it to Dr. Germaine PomfretGentz office (Fax:[phone] and [phone]).     Phone call to Dr. Germaine PomfretGentz office [phone]), spoke with practice manager, Lowella BandyNikki. Lowella Bandyikki reports that Dr. Germaine PomfretGentz has not received any of the patients labs. She reports that whenever she calls the [name] company she is told that they have sent the labs to Dr. Mliss SaxSpence. Alda Leadvised Nikki that they are not sending any labs to us. Alda Leadvised Nikki that once [facility] faxes over his recent labs we will fax them to Dr. Germaine PomfretGentz office and to please contact us in thirty minutes if they do not receive the results. Asked Lowella Bandyikki if she could contact the patient today to discuss his results with him. She stated that she has talked with him prior to this conversation and has asked Dr. Germaine PomfretGentz to call him personally.

## 2016-10-08 NOTE — Telephone Encounter (Signed)
Pt called office upset stating his lab values were abnormal, per PCP, and Dr. Germaine PomfretGentz did not call him or adjust any of his medications.     Pt advised no lab values have been sent to office in regard to him.     When asked who is drawing his labs, pt states [name]. ([phone])    Called [name] and spoke with Elease HashimotoPatricia. Per Elease HashimotoPatricia labs are only going to PCP, Dr. Mliss SaxSpence and to the infusion center. Not Dr. Germaine PomfretGentz.     Called pt back, and advised him of this, and pt states he does not know why Dr. Germaine PomfretGentz is unable to see his lab results. That if Dr. Germaine PomfretGentz is treating him, then he should have his lab values and he should have looked for those labs if he did not receive them.     Apologized to pt, for him being upset, but advised again, if labs are not faxed to provider or made aware to provider via phone, fax or message, he will have no way of knowing if labs were drawn and what those values were.     Pt states that makes no sense to him, since he is a Dr. Germaine PomfretGentz pt and pt demanded to speak to Dr. Germaine PomfretGentz.     Dr.Gentz advised of this and states he will call pt.     Katie, from Dr. Kennith GainSpence's office called office in regard to pt, advising their office does not receive lab results either. Florentina AddisonKatie states she calls weekly to obtain pt's lab results.     Florentina AddisonKatie will fax pt's 3 most recent lab readings to office.

## 2016-10-08 NOTE — Telephone Encounter (Signed)
Pt's appointment has been moved changed to 10/13/2016 at 1300.

## 2016-10-08 NOTE — Telephone Encounter (Signed)
D/w patient.  Please schedule for 6/18 at 1pm

## 2016-10-08 NOTE — Telephone Encounter (Signed)
Abnormal lab reports, marked urgent faxed to Dr. Germaine PomfretGentz 10/08/16 on pt who called earlier, upset about his CRP level because he was under the impression Dr. Germaine PomfretGentz was receiving all of his lab reports and too busy to respond.  Pt has been informed Dr. Germaine PomfretGentz had not received abnormal reports mentioned and results were requested by nurse. Ldm

## 2016-10-12 NOTE — Progress Notes (Signed)
Telephone to the patient for follow-up.  He saw the wound care podiatrist at [facility] today.  He plans on completing the 6 weeks of antibiotics and re-operating on his foot and toe.  Patient is understandably disappointed.  He will see Dr.Gentz on the 25th.

## 2016-10-13 ENCOUNTER — Ambulatory Visit: Admit: 2016-10-13 | Discharge: 2016-10-13 | Payer: MEDICARE | Attending: Infectious Disease | Primary: Family Medicine

## 2016-10-13 DIAGNOSIS — M86671 Other chronic osteomyelitis, right ankle and foot: Secondary | ICD-10-CM

## 2016-10-13 NOTE — Progress Notes (Signed)
[facility] Progress Note           Valetta Close DO    [phone] Office  [phone]  Fax    10/13/2016      Assessment & Plan:   1. 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 S/p I&D and right 2nd metatarsal bone biopsy on 09/05/2016. ??Operative cultures grew MRSE, anaerobes, providencia rettgeri, enterococcus and candida albicans ??Attempting 6 week course IV abx to salvage foot however podiatry has recommended further debridement which will be done in approximately 2 weeks.  Discussed options at length with patient.  Foot clinically stable so will continue current abx up until surgery.  We determine further abx course based on intraoperative findings.  Stop fluconazole.  [facility]  2. Elevated CRP.  Discussed at length with patient including rationale pertaining to further imaging.   As foot clinically stable and surgery planned will continue current course.  Patient to contact me with any changes involving the wound  3. Idiopathic peripheral neuropathy.           Subjective:     No complaints    Objective:     Vitals:   Visit Vitals   ??? BP 102/68 (BP 1 Location: Left arm, BP Patient Position: Sitting)   ??? Pulse 64   ??? Temp 97.7 ??F (36.5 ??C) (Oral)   ??? Resp 18   ??? Ht '6\' 1"'  (1.854 m)   ??? Wt 155.1 kg (342 lb)   ??? SpO2 99%   ??? BMI 45.12 kg/m2       Exam:     Physical Examination:   General:  Alert, cooperative, no distress   Head:  Normocephalic, atraumatic.   Eyes:  Conjunctivae clear   Neck:        Lungs:   No distress.     Chest wall:     Heart:     Abdomen:      Extremities: Moves all.  No cyanosis or edema.   Skin:  Plantar wound packed.  2nd toe edematous.  No erythema or drainage   Neurologic: CNII-XII intact. Normal strength     Labs:        No lab exists for component: ITNL   No results for input(s): CPK, CKMB, TROIQ in the last 72 hours.   No results for input(s): NA, K, CL, CO2, BUN, CREA, GLU, PHOS, MG, ALB, WBC, HGB, HCT, PLT, HGBEXT, HCTEXT, PLTEXT in the last 72 hours.    No lab exists for component:  CA  No results for input(s): INR, PTP, APTT in the last 72 hours.    No lab exists for component: INREXT  Needs: urine analysis, urine sodium, protein and creatinine  No results found for: NAU, CREAU      Cultures:     No results found for: SDES  Lab Results   Component Value Date/Time    Culture result: NO GROWTH 6 DAYS 09/09/2016 12:16 AM    Culture result: (A) 09/05/2016 09:39 AM     SCANT STAPHYLOCOCCUS EPIDERMIDIS (OXACILLIN RESISTANT)    Culture result: SCANT ENTEROCOCCUS FAECALIS GROUP D (A) 09/05/2016 09:39 AM    Culture result: RARE PROVIDENCIA RETTGERI (A) 09/05/2016 09:39 AM    Culture result: RARE CANDIDA ALBICANS (A) 09/05/2016 09:39 AM    Culture result: (A) 09/05/2016 09:39 AM     FEW MIXED ANAEROBES CONSISTING OF ANAEROBIC GRAM NEGATIVE RODS BETA LACTAMASE POSITIVE  Culture result: AND FEW ANAEROBIC GRAM POSITIVE COCCI (A) 09/05/2016 09:39 AM       Radiology:     Medications       Current Outpatient Prescriptions   Medication Sig Dispense   ??? silver sulfADIAZINE (SILVADENE) 1 % topical cream APPLY TO AFFECTED AREA TWICE A DAY    ??? lisinopril (PRINIVIL, ZESTRIL) 20 mg tablet TAKE 1 TABLET EVERY DAY 90 Tab   ??? BYSTOLIC 10 mg tablet TAKE 1 TABLET EVERY DAY 90 Tab   ??? amLODIPine (NORVASC) 5 mg tablet TAKE 1 TABLET EVERY DAY 90 Tab   ??? pregabalin (LYRICA) 300 mg capsule Take 1 Cap by mouth two (2) times a day. Max Daily Amount: 600 mg. 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 cefTRIAXone 2 gram 2 g, ADDaptor 1 Device IVPB 2 g by IntraVENous route every twenty-four (24) hours for 39 days. 39 Dose   ??? fluconazole (DIFLUCAN) 200 mg tablet Take 2 Tabs by mouth daily for 38 days. FDA advises cautious prescribing of oral fluconazole in pregnancy. 76 Tab    ??? metroNIDAZOLE (FLAGYL) 500 mg tablet Take 1 Tab by mouth every eight (8) hours for 39 days. 117 Tab   ??? vancomycin 10 gram 2,000 mg IVPB 2,000 mg by IntraVENous route every twelve (12) hours every twelve (12) hours for 39 days. (Patient taking differently: 1,500 mg by IntraVENous route every twelve (12) hours every twelve (12) hours.) 78 Dose   ??? atorvastatin (LIPITOR) 40 mg tablet Take 40 mg by mouth nightly.    ??? co-enzyme Q-10 (CO Q-10) 100 mg capsule Take 100 mg by mouth nightly.    ??? Flaxseed Oil oil Take 1 Cap by mouth daily.    ??? DULoxetine (CYMBALTA) 60 mg capsule Take 60 mg by mouth daily.    ??? cpap machine kit by Does Not Apply route.    ??? aspirin (ASPIRIN) 325 mg tablet Take 1 Tab by mouth daily. 90 Tab   ??? DOCOSAHEXANOIC ACID/EPA (FISH OIL PO) Take 1 Cap by mouth daily.    ??? HYDROcodone-acetaminophen (XODOL) 7.5-300 mg tablet Take 1 Tab by mouth nightly as needed. Max Daily Amount: 1 Tab. 10 Tab     No current facility-administered medications for this visit.            Case discussed with:      Valetta Close, DO

## 2016-10-13 NOTE — Telephone Encounter (Signed)
Dr. Germaine PomfretGentz called requesting pt recent CRP. Advised Dr that per nurse Florentina Addisonkatie, She request labs from [facility], but hasn't been able to do so do to issues that [facility] is experiencing. Dr states once Dr. Mliss SaxSpence receive labs can they be faxed to his office.     Fax:  [phone]

## 2016-10-13 NOTE — Progress Notes (Signed)
Chief Complaint   Patient presents with   ??? Medication Evaluation     Antibiotic therapy     1. Have you been to the ER, urgent care clinic since your last visit?  Hospitalized since your last visit? No      2. Have you seen or consulted any other health care providers outside of the [facility] since your last visit?  Include any pap smears or colon screening. No

## 2016-10-13 NOTE — Telephone Encounter (Signed)
Called to Dr. Germaine PomfretGentz office. Spoke to [place] and she was advised that the only way we are able to get patients labs are to call and request them from [facility]. The home health company does not send our office the lab results. Unable to reach [facility] by phone. Katina Dungdvised Barbara that we are under the impression that Dr. Germaine PomfretGentz is the ordering physician for the labs.

## 2016-10-14 NOTE — Telephone Encounter (Signed)
Labs requested from [facility] and faxed to Dr. Germaine PomfretGentz office.

## 2016-10-14 NOTE — Telephone Encounter (Signed)
Spoke with patient to advise him that his home health company did not draw the CMP as we requested. An order form is going to be faxed over for him to give to his nurse please make sure the home health nurse gets this. Patient verbalized understanding. Order faxed to [phone]

## 2016-10-15 MED ORDER — METRONIDAZOLE 500 MG TAB
500 mg | ORAL_TABLET | Freq: Three times a day (TID) | ORAL | 0 refills | Status: DC
Start: 2016-10-15 — End: 2016-10-20

## 2016-10-15 NOTE — Telephone Encounter (Signed)
Pt called requesting refill. Verified [facility] [place].

## 2016-10-18 ENCOUNTER — Encounter: Attending: Pediatric Pulmonology | Primary: Family Medicine

## 2016-10-19 NOTE — Progress Notes (Signed)
Spoke with the patient by phone again today.  He has seen the ID physician, Dr. Germaine PomfretGentz and is due to have repeat surgery on his foot next week with the wound clinic specialist/podiatrist who [facility].  When the home health care nurse looked at his foot today  he felt that the ulcer on his foot was smaller.  His vancomycin dose has been increased.  He feels better and is confident that he is getting the treatment that he needs and will no more next week.  Thank you,  Dr. Mliss SaxSpence

## 2016-10-20 ENCOUNTER — Encounter: Attending: Infectious Disease | Primary: Family Medicine

## 2016-10-20 MED ORDER — METRONIDAZOLE 500 MG TAB
500 mg | ORAL_TABLET | ORAL | 2 refills | Status: DC
Start: 2016-10-20 — End: 2017-03-20

## 2016-10-20 NOTE — Telephone Encounter (Signed)
Recent labs faxed to Dr. Germaine PomfretGentz and to patients email. Spoke with patient on the telephone and advised him per Dr. Mliss SaxSpence: "Drink more water."

## 2016-10-20 NOTE — Telephone Encounter (Signed)
Pt called following up on [facility] for metronidazole sent to Dr. Germaine PomfretGentz today, stating he only has 1 pill left and thinks he needs to continue it until his surgery.  Pt asking that Dr. Germaine PomfretGentz please approve refill today. Ldm

## 2016-10-21 NOTE — Telephone Encounter (Signed)
Pt called requesting call back from Dr. Germaine PomfretGentz regarding his high Vancomycin levels shown in recent labs drawn, stating if this is the case why is he being prescribed higher doses and volume of vancomycin.  Please advise at [phone]. Ldm

## 2016-10-21 NOTE — Telephone Encounter (Signed)
Returned call.  Called pharmacy to request CRP (ordered but not being done)

## 2016-10-22 NOTE — Telephone Encounter (Signed)
Maralyn SagoSarah called from [facility] called requesting call back from Dr. Germaine PomfretGentz to confirm she's reducing pt's dose of Vancomycin due to elevated Vancomycin level of 24.  Please call [phone]. Ldm

## 2016-10-22 NOTE — Telephone Encounter (Signed)
vanco dose adjusted by pharmacy

## 2016-10-29 ENCOUNTER — Telehealth

## 2016-10-29 NOTE — Telephone Encounter (Signed)
Pt states that Dr. Mliss SaxSpence advised him to stop taking statin medication. Pt wants to know does he still need to continue not taking medication or start back. Pt is requesting a call from nurse today. I did advise pt that Dr Mliss SaxSpence is out of office.

## 2016-10-29 NOTE — Telephone Encounter (Signed)
Returned call to patient and spoke to him. He states he was told to stop taking his STATIN because of the ABT he is receiving. Pt reports still taking the ABT. Wants to know when he can start back taking his STATIN? Concerned of his health while off the medication.

## 2016-11-02 NOTE — Telephone Encounter (Signed)
Called and spoke to patient. Patient states his surgery went well. His second toe on right foot was shortened. States that he will go back next week to have stapled removed. He will have final results for his infection tomorrow. Taken off all IV ABT today and PICC line removed. Now only taking Flagyl. Fluconazole was stopped a few weeks ago.     Advised per Dr. Mliss SaxSpence he can start back talking Lipitor but only 1/2 of his dose and labs need to be followed up with liver function test.     Pt states that he doesn't get labs in [place] but is coming home in 2 week for class reunion and wants to see if order for labs can be entered by Dr. Mliss SaxSpence for on the 16th and he will come to office and have labs drawn. Appt was scheduled for patient on 11/15/16 at 1:05 pm to follow up with labs/medications/ wound.

## 2016-11-03 NOTE — Addendum Note (Signed)
Addended by: Jacelyn GripSPENCE, Navin Dogan N on: 11/03/2016 08:06 AM      Modules accepted: Orders

## 2016-11-03 NOTE — Telephone Encounter (Signed)
See orders.

## 2016-11-04 NOTE — Telephone Encounter (Signed)
Dr. Germaine PomfretGentz called requesting pt labs be sent to him.    [phone]

## 2016-11-05 NOTE — Telephone Encounter (Signed)
Called and spoke to Dr. Germaine PomfretGentz. Dr. Germaine PomfretGentz states he no longer needs labs due to patient not receiving IV ABT.

## 2016-11-12 ENCOUNTER — Encounter

## 2016-11-13 LAB — METABOLIC PANEL, COMPREHENSIVE
A-G Ratio: 1.4 (ref 1.2–2.2)
ALT (SGPT): 18 IU/L (ref 0–44)
AST (SGOT): 21 IU/L (ref 0–40)
Albumin: 3.7 g/dL (ref 3.6–4.8)
Alk. phosphatase: 53 IU/L (ref 39–117)
BUN/Creatinine ratio: 17 (ref 10–24)
BUN: 19 mg/dL (ref 8–27)
Bilirubin, total: 0.3 mg/dL (ref 0.0–1.2)
CO2: 20 mmol/L (ref 20–29)
Calcium: 8.8 mg/dL (ref 8.6–10.2)
Chloride: 107 mmol/L — ABNORMAL HIGH (ref 96–106)
Creatinine: 1.11 mg/dL (ref 0.76–1.27)
GFR est AA: 79 mL/min/{1.73_m2} (ref >59)
GFR est non-AA: 68 mL/min/{1.73_m2} (ref >59)
GLOBULIN, TOTAL: 2.7 g/dL (ref 1.5–4.5)
Glucose: 85 mg/dL (ref 65–99)
Potassium: 4.4 mmol/L (ref 3.5–5.2)
Protein, total: 6.4 g/dL (ref 6.0–8.5)
Sodium: 141 mmol/L (ref 134–144)

## 2016-11-13 LAB — CBC WITH AUTOMATED DIFF
ABS. BASOPHILS: 0.1 10*3/uL (ref 0.0–0.2)
ABS. EOSINOPHILS: 0.3 10*3/uL (ref 0.0–0.4)
ABS. IMM. GRANS.: 0 10*3/uL (ref 0.0–0.1)
ABS. MONOCYTES: 0.6 10*3/uL (ref 0.1–0.9)
ABS. NEUTROPHILS: 3.4 10*3/uL (ref 1.4–7.0)
Abs Lymphocytes: 1.3 10*3/uL (ref 0.7–3.1)
BASOPHILS: 1 %
EOSINOPHILS: 4 %
HCT: 41.7 % (ref 37.5–51.0)
HGB: 14.5 g/dL (ref 13.0–17.7)
IMMATURE GRANULOCYTES: 0 %
Lymphocytes: 23 %
MCH: 30.9 pg (ref 26.6–33.0)
MCHC: 34.8 g/dL (ref 31.5–35.7)
MCV: 89 fL (ref 79–97)
MONOCYTES: 11 %
NEUTROPHILS: 61 %
PLATELET: 213 10*3/uL (ref 150–379)
RBC: 4.69 x10E6/uL (ref 4.14–5.80)
RDW: 15.8 % — ABNORMAL HIGH (ref 12.3–15.4)
WBC: 5.6 10*3/uL (ref 3.4–10.8)

## 2016-11-13 LAB — LIPID PANEL
Cholesterol, total: 169 mg/dL (ref 100–199)
HDL Cholesterol: 44 mg/dL (ref >39)
LDL, calculated: 51 mg/dL (ref 0–99)
Triglyceride: 369 mg/dL — ABNORMAL HIGH (ref 0–149)
VLDL, calculated: 74 mg/dL — ABNORMAL HIGH (ref 5–40)

## 2016-11-13 LAB — CRP, HIGH SENSITIVITY: C-Reactive Protein, Cardiac: 2.26 mg/L (ref 0.00–3.00)

## 2016-11-15 ENCOUNTER — Ambulatory Visit: Admit: 2016-11-15 | Discharge: 2016-11-15 | Payer: MEDICARE | Attending: Family Medicine | Primary: Family Medicine

## 2016-11-15 DIAGNOSIS — M86671 Other chronic osteomyelitis, right ankle and foot: Secondary | ICD-10-CM

## 2016-11-15 MED ORDER — PREGABALIN 300 MG CAP
300 mg | ORAL_CAPSULE | Freq: Two times a day (BID) | ORAL | 3 refills | Status: DC
Start: 2016-11-15 — End: 2016-12-14

## 2016-11-15 NOTE — Progress Notes (Signed)
Progress Note    Patient: Elijah [name] MRN: [id number]  SSN: [id number]    Date of Birth: Apr 19, 1950  Age: 66 y.o.  Sex: male        Chief Complaint   Patient presents with   ??? Wound Check   ??? Labs     review         Subjective:     Encounter Diagnoses   Name Primary?   ??? Other chronic osteomyelitis of right foot ([place]):  Foot feels better with no pain.  He says the swelling has improved although he still has 2-3+ edema in his right lower leg.  He has no fevers chills or sweats.  He finishes the Flagyl tomorrow.  We will discuss pathology with the podiatrist tomorrow.  He also has follow-up infection of his nails and needs his nails trimmed.  He discussed this with his podiatrist.     Yes   ??? Essential hypertension, benign:  BP Readings from Last 3 Encounters:   11/15/16 116/65   10/13/16 102/68   09/21/16 (!) 156/93     The patient reports:  taking medications as instructed, no medication side effects noted, no TIA's, no chest pain on exertion, no dyspnea on exertion.    Key CAD CHF Meds             lisinopril (PRINIVIL, ZESTRIL) 20 mg tablet  (Taking) TAKE 1 TABLET EVERY DAY    BYSTOLIC 10 mg tablet  (Taking) TAKE 1 TABLET EVERY DAY    amLODIPine (NORVASC) 5 mg tablet  (Taking) TAKE 1 TABLET EVERY DAY    atorvastatin (LIPITOR) 40 mg tablet  (Taking) Take 40 mg by mouth nightly.    aspirin (ASPIRIN) 325 mg tablet  (Taking) Take 1 Tab by mouth daily.    DOCOSAHEXANOIC ACID/EPA (FISH OIL PO)  (Taking) Take 1 Cap by mouth daily.           Lab Results   Component Value Date/Time    Sodium 141 11/12/2016 05:01 PM    Potassium 4.4 11/12/2016 05:01 PM    Chloride 107 (H) 11/12/2016 05:01 PM    CO2 20 11/12/2016 05:01 PM    Anion gap 9 09/09/2016 12:16 AM    Glucose 85 11/12/2016 05:01 PM    BUN 19 11/12/2016 05:01 PM    Creatinine 1.11 11/12/2016 05:01 PM    BUN/Creatinine ratio 17 11/12/2016 05:01 PM    GFR est AA 79 11/12/2016 05:01 PM    GFR est non-AA 68 11/12/2016 05:01 PM    Calcium 8.8 11/12/2016 05:01 PM     Bilirubin, total 0.3 11/12/2016 05:01 PM    AST (SGOT) 21 11/12/2016 05:01 PM    Alk. phosphatase 53 11/12/2016 05:01 PM    Protein, total 6.4 11/12/2016 05:01 PM    Albumin 3.7 11/12/2016 05:01 PM    Globulin 4.1 (H) 09/09/2016 12:16 AM    A-G Ratio 1.4 11/12/2016 05:01 PM    ALT (SGPT) 18 11/12/2016 05:01 PM     Low salt diet?yes  Aerobic exercise?  No-as a cast shoe on his right foot and cannot walk long distances.  Our goal is to normalize the blood pressure to decrease the risks of strokes and heart attacks. The patient is in agreement with the plan.          ??? Pure hypercholesterolemia:  Cardiovascular risks for him are: LDL goal is under 80  existing CAD  hypertension  hyperlipidemia.   Key Antihyperlipidemia Meds  atorvastatin (LIPITOR) 40 mg tablet  (Taking) Take 40 mg by mouth nightly.    DOCOSAHEXANOIC ACID/EPA (FISH OIL PO)  (Taking) Take 1 Cap by mouth daily.        Lab Results   Component Value Date/Time    Cholesterol, total 169 11/12/2016 05:01 PM    HDL Cholesterol 44 11/12/2016 05:01 PM    LDL, calculated 51 11/12/2016 05:01 PM    Triglyceride 369 (H) 11/12/2016 05:01 PM     Lab Results   Component Value Date/Time    ALT (SGPT) 18 11/12/2016 05:01 PM    AST (SGOT) 21 11/12/2016 05:01 PM    Alk. phosphatase 53 11/12/2016 05:01 PM    Bilirubin, direct 0.08 09/20/2016 04:30 PM    Bilirubin, total 0.3 11/12/2016 05:01 PM      Myalgias: No   Fatigue: No   Other side effects: no  Wt Readings from Last 3 Encounters:   11/15/16 347 lb 9.6 oz (157.7 kg)   10/13/16 342 lb (155.1 kg)   09/20/16 349 lb 6.4 oz (158.5 kg)     The patient is aware of our goal to reduce or eliminate the long term problems (such as strokes and heart attacks) related to poorly controlled hyperlipidemia.        ??? Atherosclerosis of native coronary artery of native heart without angina pectoris:  No chest pain, DOE or SOB. No PND or orthopnea.  He is worried about  sudden recurrence of his heart problem.  He wants to be screened for this.  Referral put in to Dr. Daniel Nones.        ??? OSA on CPAP:Controlled on CPAP. 100% compliant. Feels rested in AM.          ??? Polyneuropathy: Severe and bilateral.  The neuropathy is what set him up for an osteomyelitis.  He stepped on some broken plastic and did not realize it.  Advised him never to go without shoes or socks from now on.  Crocs are off limits because his feet can sweat absorb bacteria and increases risk of infection.  White cotton socks will please provide partial protection from injury and infection.        ??? DDD (degenerative disc disease), lumbar:  I suspect he has a left lumbar radiculopathy in addition to his polyneuropathy.        ??? Peripheral polyneuropathy: Severe loss of sensation but at bedtime burning and pain in the past in both legs.        ??? Neuropathic pain, leg, left: The neuropathic pain is certainly worse in his left hip.  He has stopped using the Q foruell unit because he was burning himself with the unit.          Current and past medical information:    Current Medications after this visit::     Current Outpatient Prescriptions   Medication Sig   ??? pregabalin (LYRICA) 300 mg capsule Take 1 Cap by mouth two (2) times a day. Max Daily Amount: 600 mg. 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 metroNIDAZOLE (FLAGYL) 500 mg tablet TAKE 1 TAB BY MOUTH EVERY EIGHT (8) HOURS FOR 28 DAYS.   ??? lisinopril (PRINIVIL, ZESTRIL) 20 mg tablet TAKE 1 TABLET EVERY DAY   ??? BYSTOLIC 10 mg tablet TAKE 1 TABLET EVERY DAY   ??? amLODIPine (NORVASC) 5 mg tablet TAKE 1 TABLET EVERY DAY   ??? atorvastatin (LIPITOR) 40 mg tablet Take 40 mg by mouth  nightly.   ??? co-enzyme Q-10 (CO Q-10) 100 mg capsule Take 100 mg by mouth nightly.   ??? Flaxseed Oil oil Take 1 Cap by mouth daily.   ??? DULoxetine (CYMBALTA) 60 mg capsule Take 60 mg by mouth daily.    ??? cpap machine kit by Does Not Apply route.   ??? aspirin (ASPIRIN) 325 mg tablet Take 1 Tab by mouth daily.   ??? DOCOSAHEXANOIC ACID/EPA (FISH OIL PO) Take 1 Cap by mouth daily.   ??? silver sulfADIAZINE (SILVADENE) 1 % topical cream APPLY TO AFFECTED AREA TWICE A DAY   ??? HYDROcodone-acetaminophen (XODOL) 7.5-300 mg tablet Take 1 Tab by mouth nightly as needed. Max Daily Amount: 1 Tab.     No current facility-administered medications for this visit.        Patient Active Problem List    Diagnosis Date Noted   ??? Osteomyelitis ([place]) 09/03/2016   ??? Polyneuropathy 05/28/2016   ??? Tubular adenoma of colon 09/23/2015   ??? Morbid obesity due to excess calories ([place]) 04/24/2015   ??? Essential hypertension, benign 02/21/2015   ??? HLD (hyperlipidemia) 02/21/2015   ??? Coronary atherosclerosis of native coronary artery 02/21/2015   ??? OSA on CPAP 02/21/2015   ??? Pure hypercholesterolemia 08/23/2013   ??? 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 Benign prostatic hyperplasia 12/29/2006       Past Medical History:   Diagnosis Date   ??? BPH (benign prostatic hyperplasia)    ??? CAD (coronary artery disease)    ??? History of vascular access device 09/08/2016    [facility] VAT, 5 FR double, R brachial, 48 cm with 1 cm out, LTA   ??? HLD (hyperlipidemia)    ??? HTN (hypertension)    ??? Morbid obesity due to excess calories ([place]) 04/24/2015   ??? OSA (obstructive sleep apnea)    ??? Polyneuropathy    ??? Tubular adenoma of colon     x2 on colonoscopy 09/18/15       Allergies   Allergen Reactions   ??? Metoprolol Other (comments)     Patient states, made me feel horrible.       Past Surgical History:   Procedure Laterality Date   ??? HX HIP REPLACEMENT Bilateral        Social History     Social History   ??? Marital status: MARRIED     Spouse name: N/A   ??? Number of children: N/A   ??? Years of education: N/A     Social History Main Topics   ??? Smoking status: Former Smoker     Types: Cigarettes   ??? Smokeless tobacco: Never Used   ??? Alcohol use No       Comment: recently has stopped   ??? Drug use: No   ??? Sexual activity: Not Asked     Other Topics Concern   ??? None     Social History Narrative       Review of Systems   Constitutional: Negative.  Negative for chills, fever, malaise/fatigue and weight loss.   HENT: Negative.  Negative for hearing loss.    Eyes: Negative.  Negative for blurred vision and double vision.   Respiratory: Negative.  Negative for cough, hemoptysis, sputum production and shortness of breath.    Cardiovascular: Negative.  Negative for chest pain, palpitations and orthopnea.   Gastrointestinal: Negative.  Negative for abdominal pain, blood in stool, heartburn, nausea and vomiting.   Genitourinary: Negative.  Negative for dysuria, frequency  and urgency.   Musculoskeletal: Negative.  Negative for back pain, myalgias and neck pain.   Skin: Negative.  Negative for rash.   Neurological: Positive for sensory change. Negative for dizziness, tingling, tremors, weakness and headaches.        Severe polyneuropathy below the knees.   Endo/Heme/Allergies: Negative.    Psychiatric/Behavioral: Negative.  Negative for depression.       Objective:     Vitals:    11/15/16 1311   BP: 116/65   Pulse: 67   Resp: 20   Temp: 98.7 ??F (37.1 ??C)   TempSrc: Oral   SpO2: 96%   Weight: 347 lb 9.6 oz (157.7 kg)   Height: '6\' 1"'$  (1.854 m)      Body mass index is 45.86 kg/(m^2).    Physical Exam   Constitutional: He is oriented to person, place, and time and well-developed, well-nourished, and in no distress.   HENT:   Head: Normocephalic and atraumatic.   Mouth/Throat: Oropharynx is clear and moist.   Eyes: Right eye exhibits no discharge. Left eye exhibits no discharge. No scleral icterus.   Neck: No tracheal deviation present. No thyromegaly present.   No bruit.   Cardiovascular: Normal rate, regular rhythm and normal heart sounds.    Pulmonary/Chest: Effort normal and breath sounds normal.   Abdominal: Soft.   Musculoskeletal: He exhibits edema.    Right leg.  Nontender and no redness.  He is leaving to go to [place] today so I will have to defer a decision about venous Doppler to his podiatrist in [place].  He finishes his Flagyl by mouth tomorrow.   Neurological: He is alert and oriented to person, place, and time.   Skin: No rash noted. No erythema.   He has a well-healed incision with sutures remaining on the dorsum of his left second toe proximally.  I do not see any openings to his old foot ulcers on his sole.  The foot is not warm to touch his CRP is back to normal.  He says the podiatrist said that tests show that he might still have infection but clinically he did not.  I am assuming he is talking about the pathology from surgery   Psychiatric: Mood and affect normal.   Nursing note and vitals reviewed.    Health Maintenance Due   Topic Date Due   ??? FOBT Q 1 YEAR AGE 35-75  12/22/1998   ??? Pneumococcal 65+ Low/Medium Risk (1 of 2 - PCV13) 12/21/2013   ??? MEDICARE YEARLY EXAM  07/30/2016   ??? GLAUCOMA SCREENING Q2Y  10/27/2016   ??? Influenza Age 23 to Adult  10/27/2016         Assessment and orders:     Encounter Diagnoses     ICD-10-CM ICD-9-CM   1. Other chronic osteomyelitis of right foot ([place]) M86.671 730.17   2. Essential hypertension, benign I10 401.1   3. Pure hypercholesterolemia E78.00 272.0   4. Atherosclerosis of native coronary artery of native heart without angina pectoris I25.10 414.01   5. OSA on CPAP G47.33 327.23    Z99.89 V46.8   6. Polyneuropathy G62.9 356.9   7. DDD (degenerative disc disease), lumbar M51.36 722.52   8. Peripheral polyneuropathy G62.9 356.9   9. Neuropathic pain, leg, left G57.92 355.8   10. Coronary artery disease involving autologous vein coronary bypass graft without angina pectoris I25.810 414.02     Diagnoses and all orders for this visit:    1. Other chronic osteomyelitis  of right foot ([facility])-he has had surgery.  The proximal phalanx of his second right toe was removed apparently his  foot color looks much better.  His wounds are healing well.  He has chronic edema in that leg which he says is actually better today.  He will see the podiatrist who did his surgery tomorrow and ask him about a venous Doppler since this is the first time as she is leg pain for a while.    2. Essential hypertension, benign-controlled    3. Pure hypercholesterolemia-LDL is corrected but triglycerides are very high.  He said this specimen was not fasting.  Likely explain some of the triglyceride elevation.  Also worried that he is prediabetic.  He will begin a low carbohydrate low starch diet.    4. Atherosclerosis of native coronary artery of native heart without angina pectoris he was scheduled to see Dr. Daniel Nones before he went to the hospital with his osteomyelitis.  We will reschedule this today since he is inquiring about a cardiac scan.  I will defer to Dr. Daniel Nones whether he wants to have a CT calcium or MRI scan since she is already had a bypass.  -     REFERRAL TO CARDIOLOGY    5. OSA on CPAP-controlled and compliant    6. Polyneuropathy-severe, on maximum dose of Lyrica        -     pregabalin (LYRICA) 300 mg capsule; Take 1 Cap by mouth two (2) times a day. Max Daily            Amount: 600 mg. Indications: NEUROPATHIC PAIN ASSOCIATED WITH SPINAL CORD NJURY,       peripheral neuropathy    7. DDD (degenerative disc disease), lumbar  -     pregabalin (LYRICA) 300 mg capsule; Take 1 Cap by mouth two (2) times a day. Max Daily Amount: 600 mg. Indications: NEUROPATHIC PAIN ASSOCIATED WITH SPINAL CORD INJURY, peripheral neuropathy      8. Neuropathic pain, leg, left-he may have a component of degenerative disc disease and radiculopathy contributing to this pain.  -     pregabalin (LYRICA) 300 mg capsule; Take 1 Cap by mouth two (2) times a day. Max Daily Amount: 600 mg. Indications: NEUROPATHIC PAIN ASSOCIATED WITH SPINAL CORD INJURY, peripheral neuropathy            Plan of care:   Discussed diagnoses in detail with patient.     Medication risks/benefits/side effects discussed with patient.     All of the patient's questions were addressed. The patient understands and agrees with our plan of care.    The patient knows to call back if they are unsure of or forget any changes we discussed today or if the symptoms change.     The patient received an After-Visit Summary which contains VS, orders, medication list and allergy list. This can be used as a "mini-medical record" should they have to seek medical care while out of town.    Patient Care Team:  [name], MD as PCP - General (Family Practice)  Cammy Copa, MD (Cardiology)  Verdene Lennert, MD (Orthopedic Surgery)  Raynaldo Opitz. Dema Severin, MD (Orthopedic Surgery)  Haynes Dage, RN as Ambulatory Care Navigator (Family Practice)  Patrick E [name], DPM (Podiatry)  Valetta Close, DO as Hospitalist (Infectious Diseases)    Follow-up Disposition: Not on File    No future appointments.    Signed By: [name], MD     November 15, 2016        .

## 2016-11-15 NOTE — Telephone Encounter (Signed)
-----   Message from Jacelyn Grip, MD sent at 11/15/2016  2:19 PM EDT -----  Regarding: Please call patient  There are 2 procedures for heart scans. One is CT and the other is MRI. I would like for him to see Dr. Gasper Lloyd for to let him pick which test he can get the most information from.  Thank you,  Dr. Mliss Sax      ----- Message -----     From: Thom Chimes, LPN     Sent: 4/49/2010   2:09 PM       To: Jacelyn Grip, MD    He can have a cardiac MRI done even though he has had a bilateral hip replacement.

## 2016-11-15 NOTE — Patient Instructions (Addendum)
Learning About High Cholesterol  What is high cholesterol?    Cholesterol is a type of fat in your blood. It is needed for many body functions, such as making new cells. Cholesterol is made by your body. It also comes from food you eat.  If you have too much cholesterol, it starts to build up in your arteries. This is called hardening of the arteries, or atherosclerosis. High cholesterol raises your risk of a heart attack and stroke.  There are different types of cholesterol. LDL is the "bad" cholesterol. High LDL can raise your risk for heart disease, heart attack, and stroke. HDL is the "good" cholesterol. High HDL is linked with a lower risk for heart disease, heart attack, and stroke.  Your cholesterol levels help your doctor find out your risk for having a heart attack or stroke.  How can you prevent high cholesterol?  A heart-healthy lifestyle can help you prevent high cholesterol. This lifestyle helps lower your risk for a heart attack and stroke.  ?? Eat heart-healthy foods.  ?? Eat fruits, vegetables, whole grains (like oatmeal), dried beans and peas, nuts and seeds, soy products (like tofu), and fat-free or low-fat dairy products.  ?? Replace butter, margarine, and hydrogenated or partially hydrogenated oils with olive and canola oils. (Canola oil margarine without trans fat is fine.)  ?? Replace red meat with fish, poultry, and soy protein (like tofu).  ?? Limit processed and packaged foods like chips, crackers, and cookies.  ?? Be active. Exercise can improve your cholesterol level. Get at least 30 minutes of exercise on most days of the week. Walking is a good choice. You also may want to do other activities, such as running, swimming, cycling, or playing tennis or team sports.  ?? Stay at a healthy weight. Lose weight if you need to.  ?? Don't smoke. If you need help quitting, talk to your doctor about stop-smoking programs and medicines. These can increase your chances of quitting for good.   How is high cholesterol treated?  The goal of treatment is to reduce your chances of having a heart attack or stroke. The goal is not to lower your cholesterol numbers only.  ?? You may make lifestyle changes, such as eating healthy foods, not smoking, losing weight, and being more active.  ?? You may have to take medicine.  Follow-up care is a key part of your treatment and safety. Be sure to make and go to all appointments, and call your doctor if you are having problems. It's also a good idea to know your test results and keep a list of the medicines you take.  Where can you learn more?  Go to http://www.healthwise.net/[employer].  Enter Q621 in the search box to learn more about "Learning About High Cholesterol."  Current as of: Aug 06, 2015  Content Version: 11.7  ?? 2006-2018 [employer]. Care instructions adapted under license by [employer] (which disclaims liability or warranty for this information). If you have questions about a medical condition or this instruction, always ask your healthcare professional. Healthwise, Inc[employer] disclaims any warranty or liability for your use of this information.

## 2016-11-15 NOTE — Telephone Encounter (Signed)
Called and spoke to patient. Advised per Dr. Mliss Sax:  There are 2 procedures for heart scans. One is CT and the other is MRI. I would like for him to see Dr. Gasper Lloyd for to let him pick which test he can get the most information from.     Pt verbalized understanding and states he is driving and will call the office back to make that appt.

## 2016-11-15 NOTE — Progress Notes (Signed)
1. Have you been to the ER, urgent care clinic since your last visit?  Hospitalized since your last visit?No    2. Have you seen or consulted any other health care providers outside of the [facility] since your last visit?  Include any pap smears or colon screening. No  Reviewed record in preparation for visit and have necessary documentation  Pt did not bring medication to office visit for review  Goals that were addressed and/or need to be completed during or after this appointment include     Health Maintenance Due   Topic Date Due   ??? FOBT Q 1 YEAR AGE 19-75  12/22/1998   ??? Pneumococcal 65+ Low/Medium Risk (1 of 2 - PCV13) 12/21/2013   ??? MEDICARE YEARLY EXAM  07/30/2016   ??? GLAUCOMA SCREENING Q2Y  10/27/2016   ??? Influenza Age 59 to Adult  10/27/2016

## 2016-11-30 NOTE — Telephone Encounter (Signed)
Called and spoke to patient. Advised him per  Dr. Mliss SaxSpence:    With his recent osteomyelitis I cannot give him prednisone over the phone. ??He needs to be evaluated at urgent care center in [place].     Pt verbalized understanding.

## 2016-11-30 NOTE — Telephone Encounter (Signed)
After I has spoke to patient advising him to go to urgent care. Patient called back to office requesting to speak to me. He states he is "fit to be tied" and upset about being advised to go to urgent care. He states the reason his foot became the way it was was due to negligence.  Patient was upset and states he can not believe he is being told to go to urgent care to a doctor that knows nothing about him. I did advise him that face to face a doctor can assess him and maybe do x-ray.   He request a call back from Dr. Mliss SaxSpence personally.

## 2016-11-30 NOTE — Telephone Encounter (Signed)
Patient states he was tieing his shoes bending over and he felt his "back go out". States this has happened before. Requesting a steroid medication such a prednisone to help him. He states he can barely walk due to the pain but he knows it will be better in a couple days.   Please advise. He is in [place]. Pharmacy is [facility] in [place].

## 2016-11-30 NOTE — Telephone Encounter (Signed)
I spoke with the patient and listened to his concerns. I still do not want him on prednisone given his history of osteomyelitis. He understood why I would not send in a prescription- it was not an attempt to send him to someone to someone who knew nothing of his history.  Thank you,  Dr. Mliss SaxSpence

## 2016-12-01 NOTE — Telephone Encounter (Signed)
Patient called requesting a call back from Dr. Mliss SaxSpence.  He stated it was personal.

## 2016-12-09 ENCOUNTER — Encounter

## 2016-12-09 MED ORDER — MEFLOQUINE 250 MG TAB
250 mg | ORAL_TABLET | ORAL | 0 refills | Status: DC
Start: 2016-12-09 — End: 2016-12-27

## 2016-12-09 MED ORDER — DULOXETINE 60 MG CAP, DELAYED RELEASE
60 mg | ORAL_CAPSULE | Freq: Every day | ORAL | 4 refills | Status: DC
Start: 2016-12-09 — End: 2017-04-04

## 2016-12-09 MED ORDER — CIPROFLOXACIN 500 MG TAB
500 mg | ORAL_TABLET | Freq: Two times a day (BID) | ORAL | 0 refills | Status: DC
Start: 2016-12-09 — End: 2017-03-20

## 2016-12-09 NOTE — Progress Notes (Signed)
The patient by phone.  He is in [place].  He is getting ready to go to [place].  I reviewed the CDC website and the passport health website for travel recommendations.  I have given him a prescription for the mefloquine to begin today and the Cipro to take for traveler's diarrhea (2 7 day courses).  He must stop his Cymbalta if he takes his Cipro-discussed personally with him at which time he says he needs a refill of Cymbalta which was done.  In addition to that I have asked him to call the [facility] "[facility]" and given him the phone number to be seen for evaluation of immunizations he may need for travel to this area.  He has had a number of immunizations during his [employer] career but I have no way of knowing what he has had.  No less than 8 vaccinations are recommended on their website.    He also mentioned that his right foot is still swelling.  He does not recall if he asked the podiatrist about that during his last visit but I had suggested that he may need a venous Doppler that leg.  He agreed to have the testing done particularly before he takes an overseas trip.  I put in a request for the [facility] [place] [facility] facility and given information to the referral coordinator.  Thank you,  Dr. Mliss SaxSpence

## 2016-12-09 NOTE — Telephone Encounter (Signed)
Pt called stating that he is about to go to [place] and wants to know if Dr. Mliss SaxSpence could write him a prescription malaria & diarrhea

## 2016-12-09 NOTE — Telephone Encounter (Signed)
A venous duplex of the right lower extremity has been scheduled at [facility] for December 10, 2016 at 10:30 with an arrival time of 10:15.  Phone number to the vascular lab is [phone] and fax number is [phone].  Patient is to come to the Physician Office Building located at [address].  He is to take the elevator to the third floor and come to room 3150 in the [employer].  Patient informed by phone and order faxed.

## 2016-12-14 ENCOUNTER — Encounter

## 2016-12-14 MED ORDER — PREGABALIN 300 MG CAP
300 mg | ORAL_CAPSULE | Freq: Two times a day (BID) | ORAL | 3 refills | Status: DC
Start: 2016-12-14 — End: 2017-03-31

## 2016-12-27 MED ORDER — MEFLOQUINE 250 MG TAB
250 mg | ORAL_TABLET | ORAL | 0 refills | Status: AC
Start: 2016-12-27 — End: 2017-02-22

## 2016-12-27 NOTE — Telephone Encounter (Signed)
Pt called stating that the medication that Dr. Mliss Sax prescribed for malaria was thrown away by cleaning crew. Pt states that he is leaving to go out of the country in 2 hours and wants to know if a [facility]    Phone number:[phone]

## 2016-12-30 ENCOUNTER — Encounter: Attending: Specialist | Primary: Family Medicine

## 2017-01-18 ENCOUNTER — Encounter: Attending: Specialist | Primary: Family Medicine

## 2017-01-20 ENCOUNTER — Encounter: Attending: Specialist | Primary: Family Medicine

## 2017-01-20 NOTE — Progress Notes (Deleted)
Elijah [name]     [birth year]       Elijah [name] Nones MD, [facility]  Date of Visit-01/18/2017   PCP is Elijah Ewings Neale Burly, MD   [facility]  [facility]  HPI:  Elijah [name] is a 66 y.o. male   ***    Denies chest pain, edema, syncope or shortness of breath at rest, has no tachycardia, palpitations or sense of arrhythmia.      EKG:     Assessment/Plan:     1. ***     Impression: No diagnosis found.   Cardiac History:   No specialty comments available.     ROS-except as noted above.. A complete cardiac and respiratory are reviewed and negative except as above ; Resp-denies wheezing  or productive cough,. Const- No unusual weight loss or fever; Neuro-no recent seizure or CVA ; GI- No BRBPR, abdom pain, bloating ; GU- no  hematuria   see supplement sheet, initialed and to be scanned by staff  Past Medical History:   Diagnosis Date   ??? BPH (benign prostatic hyperplasia)    ??? CAD (coronary artery disease)    ??? History of vascular access device 09/08/2016    [facility] VAT, 5 FR double, R brachial, 48 cm with 1 cm out, LTA   ??? HLD (hyperlipidemia)    ??? HTN (hypertension)    ??? Morbid obesity due to excess calories ([place]) 04/24/2015   ??? OSA (obstructive sleep apnea)    ??? Polyneuropathy    ??? Tubular adenoma of colon     x2 on colonoscopy 09/18/15      Social Hx= reports that he has quit smoking. His smoking use included cigarettes. he has never used smokeless tobacco. He reports that he does not drink alcohol or use drugs.     Exam and Labs:  There were no vitals taken for this visit.Constitutional:  NAD, comfortable  Head: NC,AT. Eyes: No scleral icterus. Neck:  Neck supple. No JVD present.Throat: moist mucous membranes.  Chest: Effort normal & normal respiratory excursion .Neurological: alert, conversant and oriented . Skin: Skin is not cold. No obvious systemic rash noted. Not diaphoretic. No erythema. Psychiatric:  Grossly normal mood and affect.  Behavior  appears normal. Extremities:  no clubbing or cyanosis. Abdomen: non distended    Lungs:breath sounds normal. No stridor. distress, wheezes or  Rales.  Heart:*** normal rate, regular rhythm, normal S1, S2, no murmurs, rubs, clicks or gallops , PMI non displaced.    Edema: Edema is ***.  Lab Results   Component Value Date/Time    Cholesterol, total 169 11/12/2016 05:01 PM    HDL Cholesterol 44 11/12/2016 05:01 PM    LDL, calculated 51 11/12/2016 05:01 PM    Triglyceride 369 (H) 11/12/2016 05:01 PM     Lab Results   Component Value Date/Time    Sodium 141 11/12/2016 05:01 PM    Potassium 4.4 11/12/2016 05:01 PM    Chloride 107 (H) 11/12/2016 05:01 PM    CO2 20 11/12/2016 05:01 PM    Anion gap 9 09/09/2016 12:16 AM    Glucose 85 11/12/2016 05:01 PM    BUN 19 11/12/2016 05:01 PM    Creatinine 1.11 11/12/2016 05:01 PM    BUN/Creatinine ratio 17 11/12/2016 05:01 PM    GFR est AA 79 11/12/2016 05:01 PM    GFR est non-AA 68 11/12/2016 05:01 PM    Calcium 8.8 11/12/2016 05:01 PM  Wt Readings from Last 3 Encounters:   11/15/16 347 lb 9.6 oz (157.7 kg)   10/13/16 342 lb (155.1 kg)   09/20/16 349 lb 6.4 oz (158.5 kg)      BP Readings from Last 3 Encounters:   11/15/16 116/65   10/13/16 102/68   09/21/16 (!) 156/93      Current Outpatient Medications   Medication Sig   ??? mefloquine (LARIAM) 250 mg tablet Take 250 mg by mouth every seven (7) days for 9 doses. Take with food and at least 8 ounces of water, beginning today.   ??? pregabalin (LYRICA) 300 mg capsule Take 1 Cap by mouth two (2) times a day. Max Daily Amount: 600 mg. 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 ciprofloxacin HCl (CIPRO) 500 mg tablet Take 1 Tab by mouth two (2) times a day. (two 7-day courses)  Indications: TRAVELER'S DIARRHEA, must stop cymbalta while taking this.   ??? DULoxetine (CYMBALTA) 60 mg capsule Take 1 Cap by mouth daily. Indications: NEUROPATHIC PAIN    ??? metroNIDAZOLE (FLAGYL) 500 mg tablet TAKE 1 TAB BY MOUTH EVERY EIGHT (8) HOURS FOR 28 DAYS.   ??? silver sulfADIAZINE (SILVADENE) 1 % topical cream APPLY TO AFFECTED AREA TWICE A DAY   ??? lisinopril (PRINIVIL, ZESTRIL) 20 mg tablet TAKE 1 TABLET EVERY DAY   ??? BYSTOLIC 10 mg tablet TAKE 1 TABLET EVERY DAY   ??? amLODIPine (NORVASC) 5 mg tablet TAKE 1 TABLET EVERY DAY   ??? HYDROcodone-acetaminophen (XODOL) 7.5-300 mg tablet Take 1 Tab by mouth nightly as needed. Max Daily Amount: 1 Tab.   ??? atorvastatin (LIPITOR) 40 mg tablet Take 40 mg by mouth nightly.   ??? co-enzyme Q-10 (CO Q-10) 100 mg capsule Take 100 mg by mouth nightly.   ??? Flaxseed Oil oil Take 1 Cap by mouth daily.   ??? cpap machine kit by Does Not Apply route.   ??? aspirin (ASPIRIN) 325 mg tablet Take 1 Tab by mouth daily.   ??? DOCOSAHEXANOIC ACID/EPA (FISH OIL PO) Take 1 Cap by mouth daily.     No current facility-administered medications for this visit.       Impression see above.      Written by Mercie Eon, [employer], as dictated by Cammy Copa, MD.

## 2017-01-27 ENCOUNTER — Encounter: Attending: Specialist | Primary: Family Medicine

## 2017-01-28 NOTE — Telephone Encounter (Signed)
I received a call from Elijah [name] on my personal phone at 9:25 am.(I am out of te office for the next week.) [facility] with Pneumonia, sepsis and cellulitis in his right leg.   He is receiving IV ABX. He says his pneumonia is better even though he had trouble completing sentences while talking. He says his cellulitis is getting worse. He asked for my advice.   He does not want to pay for a medical evacuation but travel comercially.I advised that he travel only if the doctors there think it is safe for him to do so without medical transport but return to [place] as soon as he was able.   He may have picked up the pneumonia from his recent trip to [place] and it sounds like that his pneumonia symptoms started first.

## 2017-01-30 DIAGNOSIS — L03115 Cellulitis of right lower limb: Secondary | ICD-10-CM | POA: Insufficient documentation

## 2017-02-08 MED ORDER — AMLODIPINE 5 MG TAB
5 mg | ORAL_TABLET | ORAL | 2 refills | Status: DC
Start: 2017-02-08 — End: 2017-10-31

## 2017-02-08 NOTE — Telephone Encounter (Signed)
[facility]  Last office visit was 11/15/16.

## 2017-02-08 NOTE — Telephone Encounter (Signed)
Last labs on 11/12/16  Last office visit 11/15/16

## 2017-02-24 NOTE — Progress Notes (Signed)
I called Elijah [name] to f/u is multiple Medical Problems:    1-even though it appears his infection has cleared in the right leg the lymphedema persists.  He has lymphedema wraps to use.  2-signed off on a sleep study for him over 2 weeks ago but he has not been called to have that done yet.  3-his shortness of breath is better but he has had persistent wheezing ever since his pneumonia.  4-he has seen the cardiologist and had an echocardiogram yesterday.  The results and follow-up are pending.  5-there are no specific plans regarding his right foot surgery as yet.  6-overall he just does not feel well.  All of his current problems are understandably overwhelming.    I asked him to call me if there is anything I could do to help.

## 2017-03-15 ENCOUNTER — Encounter

## 2017-03-15 MED ORDER — LISINOPRIL 20 MG TAB
20 mg | ORAL_TABLET | ORAL | 1 refills | Status: DC
Start: 2017-03-15 — End: 2017-08-30

## 2017-03-15 MED ORDER — NEBIVOLOL 10 MG TAB
10 mg | ORAL_TABLET | ORAL | 1 refills | Status: DC
Start: 2017-03-15 — End: 2017-08-30

## 2017-03-15 NOTE — Telephone Encounter (Signed)
Last office visit 11/15/2016    Requesting 90 day supply

## 2017-03-17 ENCOUNTER — Encounter: Attending: Family Medicine | Primary: Family Medicine

## 2017-03-17 ENCOUNTER — Ambulatory Visit: Admit: 2017-03-17 | Discharge: 2017-03-17 | Payer: MEDICARE | Attending: Family Medicine | Primary: Family Medicine

## 2017-03-17 DIAGNOSIS — L03115 Cellulitis of right lower limb: Secondary | ICD-10-CM

## 2017-03-17 NOTE — Progress Notes (Signed)
[facility]  Subjective:   Elijah [name] is a 66 y.o. male with history of HTN, HLD, CAD, polyneuropathy, BPH, OSA on CPAP, osteomyelitis  CC: Routine care  History provided by patient     HPI:    Patient presents to clinic for routine follow-up.   He was seen at [facility] for RLE cellulitis a couple of weeks ago. He states "I feel a lot better. My foot infection is gone." Patient is being closely followed by a podiatrist.      Of note, patient was seen on 02/11/17 at [facility] with complaint of cough, malaise, myalgias, and worsening SOB, especially when he climbs stairs. He was afebrile, EKG unremarkable. CXR revealed left basilar atelectasis without superimposed consolidation. He received Duoneb treatment and IV toradol, with improvement. So he was discharged home with instruction to f/u with PCP. Today, he states that he is feeling much better and denies SOB, chest pain, dizziness, abdominal pain, dysuria. No acute complaint at this visit.       Current Outpatient Medications on File Prior to Visit   Medication Sig Dispense Refill   ??? lisinopril (PRINIVIL, ZESTRIL) 20 mg tablet TAKE 1 TABLET EVERY DAY 90 Tab 1   ??? nebivolol (BYSTOLIC) 10 mg tablet TAKE 1 TABLET EVERY DAY 90 Tab 1   ??? amLODIPine (NORVASC) 5 mg tablet TAKE 1 TABLET EVERY DAY 90 Tab 2   ??? pregabalin (LYRICA) 300 mg capsule Take 1 Cap by mouth two (2) times a day. Max Daily Amount: 600 mg. 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 DULoxetine (CYMBALTA) 60 mg capsule Take 1 Cap by mouth daily. Indications: NEUROPATHIC PAIN 30 Cap 4   ??? atorvastatin (LIPITOR) 40 mg tablet Take 80 mg by mouth nightly.     ??? co-enzyme Q-10 (CO Q-10) 100 mg capsule Take 100 mg by mouth nightly.     ??? Flaxseed Oil oil Take 1 Cap by mouth daily.     ??? cpap machine kit by Does Not Apply route.     ??? aspirin (ASPIRIN) 325 mg tablet Take 1 Tab by mouth daily. (Patient  taking differently: Take 81 mg by mouth daily.) 90 Tab 0   ??? DOCOSAHEXANOIC ACID/EPA (FISH OIL PO) Take 1 Cap by mouth daily.       No current facility-administered medications on file prior to visit.        Patient Active Problem List   Diagnosis Code   ??? Essential hypertension, benign I10   ??? HLD (hyperlipidemia) E78.5   ??? Coronary atherosclerosis of native coronary artery I25.10   ??? OSA on CPAP G47.33, Z99.89   ??? Morbid obesity due to excess calories (HCC) E66.01   ??? Tubular adenoma of colon D12.6   ??? Polyneuropathy G62.9   ??? Benign prostatic hyperplasia N40.0   ??? Pure hypercholesterolemia E78.00   ??? 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 Osteomyelitis ([place]) M86.9   ??? Subacute osteomyelitis of right foot ([place]) M86.271       Social History     Socioeconomic History   ??? Marital status: MARRIED     Spouse name: Not on file   ??? Number of children: Not on file   ??? Years of education: Not on file   ??? Highest education level: Not on file   Social Needs   ??? Financial resource strain: Not on file   ??? Food insecurity -  worry: Not on file   ??? Food insecurity - inability: Not on file   ??? Transportation needs - medical: Not on file   ??? Transportation needs - non-medical: Not on file   Occupational History   ??? Not on file   Tobacco Use   ??? Smoking status: Former Smoker     Types: Cigarettes   ??? Smokeless tobacco: Never Used   Substance and Sexual Activity   ??? Alcohol use: No     Comment: recently has stopped   ??? Drug use: No   ??? Sexual activity: Not on file   Other Topics Concern   ??? Not on file   Social History Narrative   ??? Not on file       Review of Systems   Constitutional: Negative for chills and fever.   HENT: Negative for congestion.    Eyes: Negative for blurred vision.   Respiratory: Negative for cough and shortness of breath.    Cardiovascular: Negative for chest pain and palpitations.   Gastrointestinal: Negative for abdominal pain, diarrhea and vomiting.   Genitourinary: Negative for dysuria.    Musculoskeletal: Negative for myalgias.   Neurological: Negative for dizziness and headaches.         Objective:     Visit Vitals  BP 117/72 (BP 1 Location: Left arm, BP Patient Position: Sitting)   Pulse 70   Temp 98.2 ??F (36.8 ??C) (Oral)   Resp 22   Ht '6\' 1"'  (1.854 m)   Wt (!) 351 lb 6.4 oz (159.4 kg)   SpO2 96%   BMI 46.36 kg/m??        Physical Exam   Constitutional: He is oriented to person, place, and time. No distress.   Morbidly obese, talkative, of stated age.   HENT:   Head: Normocephalic and atraumatic.   Cardiovascular: Normal rate, regular rhythm and normal heart sounds.   Pulmonary/Chest: Effort normal and breath sounds normal. No respiratory distress. He has no wheezes. He has no rales.   Abdominal: Soft. He exhibits no distension. There is no tenderness.   Musculoskeletal:   Mild erythema and callus noted on medial aspect of lower right toe.    Neurological: He is alert and oriented to person, place, and time.   Skin: Skin is warm. He is not diaphoretic.   Mild erythema near medial calf area, non-tender to palpation. Patient with bandage and calf splint.       Pertinent Labs/Studies: N/A      Assessment and orders:     Pt is 66yr M who presents to clinic for follow-up    ICD-10-CM ICD-9-CM    1. Cellulitis of right lower extremity L03.115 682.6    2. Flu-like symptoms R68.89 780.99    3. OSA on CPAP G47.33 327.23     Z99.89 V46.8    4. Morbid obesity due to excess calories ([place] E66.01 278.01      Diagnoses and all orders for this visit:    1. Cellulitis of right lower extremity  Skin infection of right LE almost completely resolved s/p Doxycycline. Lymphedema improved with compression stockings. Of note, patient with erythema and corn near big toe but does not look infected. Advised against pool, until cleared by Podiatry/wound care. Will f/u in a month or sooner in case Sx return or worsen.     2. Flu-like symptoms  Resolved. Advised on Vit C use to boost up immune system.     3. OSA on CPAP  Pt with old CPAP, but does not know settings. He will set up appt with sleep specialist soon per his report.     4. Morbid obesity due to excess calories ([place])  Received handout on healthy diet and regular exercise.    Follow-up Disposition:  Return in about 4 weeks (around 04/14/2017) for follow-up.  I have reviewed patient medical and social history and medications.  I have reviewed pertinent labs results and other data. I have discussed the diagnosis with the patient and the intended plan as seen in the above orders. The patient has received an after-visit summary and questions were answered concerning future plans. I have discussed medication side effects and warnings with the patient as well.    Posey Rea, MD  Resident [facility]  03/21/17

## 2017-03-17 NOTE — Progress Notes (Signed)
??  I saw and evaluated the patient with the resident, performing the key elements of the exam and service.  I discussed the findings, assessment and plan with the resident and agree with the resident's findings and plan as documented in the resident's note.    1-lymphedema is better with his compression wraps on.    2-signed off on a sleep study for him over 4 weeks ago but he has not been called to have that done yet.  The referral coordinator on this last week.  He still has not heard anything.  He will call soon after the holidays to try to get this done.  With his dyspnea I worried that he may have nocturnal desaturations.  He has an old CPAP machine and does not know what the setting is.    3-his shortness of breath is better but [facility].  Bronchodilators cleared this up.  He has no wheezing on exam today.      4-he has seen the cardiologist and had an echocardiogram yesterday.    He says he was cleared to have his foot surgery.      5-there are no specific plans regarding his right foot surgery as yet.  He has a dense callus over the first MTP head.  It does not look infected but I recommended no pool exercise until this can be evaluated by his foot specialist.    6-overall he feels much better.  I advised that he take 500 mg of vitamin C and 400 international units of vitamin D during the wintertime to help build up his immune system.      Elijah LaudSteven N. Mliss SaxSpence, M.D.

## 2017-03-17 NOTE — Progress Notes (Signed)
1. Have you been to the ER, urgent care clinic since your last visit?  Hospitalized since your last visit?Yes When: [facility].    2. Have you seen or consulted any other health care providers outside of the [facility] since your last visit?  Include any pap smears or colon screening. No  Reviewed record in preparation for visit and have necessary documentation  Pt did not bring medication to office visit for review  Information was given to pt on Advanced Directives, Living Will  Information was given on Shingles Vaccine  opportunity was given for questions  Goals that were addressed and/or need to be completed during or after this appointment include     Health Maintenance Due   Topic Date Due   ??? Shingrix Vaccine Age 47> (1 of 2) 12/22/1998   ??? FOBT Q 1 YEAR AGE 4-75  12/22/1998   ??? Pneumococcal 65+ Low/Medium Risk (1 of 2 - PCV13) 12/21/2013   ??? MEDICARE YEARLY EXAM  07/30/2016   ??? GLAUCOMA SCREENING Q2Y  10/27/2016   ??? Influenza Age 559 to Adult  10/27/2016

## 2017-03-31 ENCOUNTER — Encounter

## 2017-03-31 MED ORDER — PREGABALIN 300 MG CAP
300 mg | ORAL_CAPSULE | Freq: Two times a day (BID) | ORAL | 1 refills | Status: DC
Start: 2017-03-31 — End: 2017-10-04

## 2017-03-31 NOTE — Telephone Encounter (Signed)
Last office visit on 03/17/17  Last labs on 11/12/16  [facility] on file

## 2017-03-31 NOTE — Telephone Encounter (Signed)
[facility] a 90 day supply with one refill per Dr. Mliss SaxSpence.   Attempted to reach patient by telephone to advise him of above but was unsuccessful.

## 2017-03-31 NOTE — Telephone Encounter (Signed)
Requesting a 90 supply  On this     Thanks    Carlena SaxBlair  [facility]  [phone]    Patients number [phone]

## 2017-03-31 NOTE — Telephone Encounter (Signed)
Patient is at the drug store waiting.  He states he has refills on this medication, he just needs us to give the pharmacy the ok to fill it a few days soon.  He is going out of town.  The phone number to the pharmacy is [phone].

## 2017-04-04 MED ORDER — DULOXETINE 60 MG CAP, DELAYED RELEASE
60 mg | ORAL_CAPSULE | Freq: Every day | ORAL | 4 refills | Status: DC
Start: 2017-04-04 — End: 2018-05-19

## 2017-04-04 NOTE — Telephone Encounter (Signed)
[facility] [place]  Last office visit was 03/17/17.

## 2017-04-04 NOTE — Telephone Encounter (Signed)
Last labs on 11/12/16  Last office visit on 03/17/17  Please advise, thank you

## 2017-04-25 NOTE — Telephone Encounter (Signed)
Spoke with Pharmacy, they are going to fill the prescription early for the patient since he is going out of the country on 04/27/16.      [facility] He asked if we could set it up for him to have it done "the regular" way [facility]

## 2017-04-25 NOTE — Telephone Encounter (Signed)
Patient is going out of the country and needs to get his Lyrica filled two days early.  Pharmacy will not fill it early without us authorizing it.  Please contact [facility] [place] at [phone] option 3 then 4 to authorize filling it two days early.

## 2017-04-25 NOTE — Telephone Encounter (Signed)
Faxed information back to the sleep center requesting status of appointment.  Information originally sent in November 2018

## 2017-04-29 NOTE — Telephone Encounter (Signed)
Called and left message for Sleep Center to call back to check on status of order.

## 2017-05-02 NOTE — Telephone Encounter (Addendum)
[facility] called and gave a different fax number to fax the information to.  Done.  Patient stated he is now scheduled for February 10th.

## 2017-05-06 NOTE — Telephone Encounter (Addendum)
[facility] Spoke with the pharmacist who reports that the patient picked up 60 capsules on 04/25/17.     After speaking with Dr. Mliss SaxSpence, advised the pharmacist that it is okay to fill the patients prescription early since he will be traveling out of the country on 05/12/17-05/30/17. He will run out of his medication on 05/24/17, while he traveling to [place]. The pharmacist verbalized understanding and agreed to call the patients insurance company to have the medication filled early.    Phone call to patient to inform him of above. He verbalized understanding.

## 2017-05-06 NOTE — Telephone Encounter (Signed)
Patient called back.  He will be traveling to [place] and will return on March 4th.  Please call him back to notify him of the status.

## 2017-05-06 NOTE — Telephone Encounter (Signed)
Returned phone call to patient, no answer.   Need to know when he is coming back to the country and what country he is traveling to.

## 2017-05-06 NOTE — Telephone Encounter (Signed)
Mr Elijah [name] is going out of the country next Thursday & needs MD's approval for refill on Lyrica  Please [facility] at [phone] & notify Mr Elijah [name] also

## 2017-05-11 NOTE — Telephone Encounter (Signed)
Phone call to [facility]: Main phone number: [phone].     Detailed message left on answering machine requesting a copy of patients recent sleep study.    Phone call to patient to advise him that we do not have a copy of his report but we have requested it and will send him a copy once we have it. Patient verbalized understanding.

## 2017-05-11 NOTE — Telephone Encounter (Signed)
Patient stated that he had his sleep study done and would like a copy of it faxed to him at [phone].  He can be reached at [phone].

## 2017-05-13 NOTE — Telephone Encounter (Signed)
Still have not received a copy of patients sleep study report and have not heard back from [facility]. Faxed a request to [phone] for patients recent sleep study.

## 2017-05-13 NOTE — Telephone Encounter (Signed)
Elijah Landngela,   Do you still have the different fax number to [facility] that you used? Every time I reach out to them for a copy of the patients sleep study I cannot get through to anyone and my call goes to the answering machine. I was going to try to send a fax requesting the sleep study.

## 2017-05-13 NOTE — Telephone Encounter (Signed)
Sleep study received and faxed to patient today.

## 2017-05-17 NOTE — Telephone Encounter (Signed)
[facility]/MARKEETA/([phone]/They want to know if Dr. Mliss SaxSpence wants  Dr. Regino SchultzeWang, neurologist/sleep specialist to f/u with Pt or whether Dr. Mliss SaxSpence will do so?

## 2017-05-17 NOTE — Telephone Encounter (Signed)
[facility]. Markeeta unable to come to the phone, left message with unidentified male.  Advised her per Dr. Mliss SaxSpence:  "Since he [place] most [place] in [place] and I do not have the sleep test results I would appreciate if Dr. Regino SchultzeWang would handle this."

## 2017-06-06 NOTE — Telephone Encounter (Signed)
Phone call to patient to discuss if his CPAP was adjusted when [facility]. Patient reports that they did not adjust his CPAP. He also states that he is in need of a new CPAP. [facility]. -- this is where the patient last had his CPAP titration study. Fax sent to [phone].

## 2017-06-06 NOTE — Telephone Encounter (Signed)
Returned phone call to patient. Advised him to contact the neurologist at the Hospital in [place]. Patient verbalized understanding and agreed to do so.

## 2017-08-30 ENCOUNTER — Encounter

## 2017-08-30 MED ORDER — NEBIVOLOL 10 MG TAB
10 mg | ORAL_TABLET | ORAL | 1 refills | Status: DC
Start: 2017-08-30 — End: 2018-01-27

## 2017-08-30 MED ORDER — LISINOPRIL 20 MG TAB
20 mg | ORAL_TABLET | ORAL | 1 refills | Status: DC
Start: 2017-08-30 — End: 2017-12-07

## 2017-09-08 ENCOUNTER — Ambulatory Visit: Admit: 2017-09-08 | Discharge: 2017-09-08 | Payer: MEDICARE | Attending: Family Medicine | Primary: Family Medicine

## 2017-09-08 ENCOUNTER — Encounter: Admit: 2017-09-08 | Primary: Family Medicine

## 2017-09-08 DIAGNOSIS — R5382 Chronic fatigue, unspecified: Secondary | ICD-10-CM | POA: Insufficient documentation

## 2017-09-08 NOTE — Progress Notes (Addendum)
I saw and evaluated the patient with the resident, performing the key elements of the exam and service.  I discussed the findings, assessment and plan with the resident and agree with the resident's findings and plan as documented in the resident's note.  Does have a fx 5th toe and ? Gas in webspace between 1st and 2nd toes. With hx of osteomyelitis he will need MRI.    Dossie Swor N. Britlyn Martine, M.D.

## 2017-09-08 NOTE — Progress Notes (Signed)
Evaluation for Fatigue.  Overall unremarkable except for persistent low Vitamin D.  ILGF is normal, unlikely acromegaly.  Will discuss starting Vitamin D -supplement.

## 2017-09-08 NOTE — Progress Notes (Signed)
1. Have you been to the ER, urgent care clinic since your last visit?  Hospitalized since your last visit?No    2. Have you seen or consulted any other health care providers outside of the [facility] since your last visit?  Include any pap smears or colon screening. No  Reviewed record in preparation for visit and have necessary documentation  Pt did not bring medication to office visit for review  Information was given to pt on Advanced Directives, Living Will  Information was given on Shingles Vaccine  opportunity was given for questions  Goals that were addressed and/or need to be completed during or after this appointment include     Health Maintenance Due   Topic Date Due   ??? Shingrix Vaccine Age 50> (1 of 2) 12/22/1998   ??? FOBT Q 1 YEAR AGE 50-75  12/22/1998   ??? Pneumococcal 65+ years (1 of 2 - PCV13) 12/21/2013   ??? MEDICARE YEARLY EXAM  07/30/2016   ??? GLAUCOMA SCREENING Q2Y  10/27/2016

## 2017-09-08 NOTE — Progress Notes (Signed)
[facility]  Subjective:   Elijah [name] is a 67 y.o. male with history of Morbid obesity, BPH, CAD, HLD, HTN, OSA, polyneuropathy, Osteomyelitis, Chronic Fatigue  CC: Foot pain and swelling, Fatigue.  History provided by patient and Records    HPI:  Left foot Pain:  Pain at the base of the big toe, pressure with walking.  Large Corn on the base of the big toe.    Pain/Swelling of the Right foot:  Pain at the little toe of the right foot.  Swelling of the right foot as well with erythema.  History of osteomyelitis of the right 2nd toe.    Fatigue: Patient noting that he has had chronic fatigue with weight gain since his last visit (10 pound weight gain in past year) despite use C-pap, though notes often awakens with poor seal and dry mouth with C-pap.  Also noting swelling in his hand and difficulty getting rings off of his hands.  Fatigue despite use of C-pap as well.  Fatigue occurs primarily during the day.  Previous evaluation with normal CRP, normal liver enzymes, normal hemoglobin, essentially normal kidney function, normal TSH in 05/2016.    PFSH:     Current Outpatient Medications on File Prior to Visit   Medication Sig Dispense Refill   ??? lisinopril (PRINIVIL, ZESTRIL) 20 mg tablet TAKE 1 TABLET BY MOUTH EVERY DAY 90 Tab 1   ??? nebivolol (BYSTOLIC) 10 mg tablet TAKE 1 TABLET BY MOUTH EVERY DAY 90 Tab 1   ??? DULoxetine (CYMBALTA) 60 mg capsule Take 1 Cap by mouth daily. 90 Cap 4   ??? pregabalin (LYRICA) 300 mg capsule Take 1 Cap by mouth two (2) times a day. Max Daily Amount: 600 mg. 180 Cap 1   ??? amLODIPine (NORVASC) 5 mg tablet TAKE 1 TABLET EVERY DAY 90 Tab 2   ??? atorvastatin (LIPITOR) 40 mg tablet Take 80 mg by mouth nightly.     ??? co-enzyme Q-10 (CO Q-10) 100 mg capsule Take 100 mg by mouth nightly.     ??? Flaxseed Oil oil Take 1 Cap by mouth daily.     ??? cpap machine kit by Does Not Apply route.     ??? aspirin (ASPIRIN) 325 mg tablet Take 1 Tab by mouth daily. (Patient  taking differently: Take 81 mg by mouth daily.) 90 Tab 0   ??? DOCOSAHEXANOIC ACID/EPA (FISH OIL PO) Take 1 Cap by mouth daily.       No current facility-administered medications on file prior to visit.        Patient Active Problem List   Diagnosis Code   ??? Essential hypertension, benign I10   ??? HLD (hyperlipidemia) E78.5   ??? Coronary atherosclerosis of native coronary artery I25.10   ??? OSA on CPAP G47.33, Z99.89   ??? Morbid obesity due to excess calories (HCC) E66.01   ??? Tubular adenoma of colon D12.6   ??? Polyneuropathy G62.9   ??? Benign prostatic hyperplasia N40.0   ??? Pure hypercholesterolemia E78.00   ??? 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 Osteomyelitis ([place]) M86.9   ??? Subacute osteomyelitis of right foot ([place]) M86.271   ??? Chronic fatigue R53.82       Social History     Socioeconomic History   ??? Marital status: MARRIED     Spouse name: Not on file   ??? Number of children: Not on file   ??? Years of education: Not on file   ???  Highest education level: Not on file   Occupational History   ??? Not on file   Social Needs   ??? Financial resource strain: Not on file   ??? Food insecurity:     Worry: Not on file     Inability: Not on file   ??? Transportation needs:     Medical: Not on file     Non-medical: Not on file   Tobacco Use   ??? Smoking status: Former Smoker     Types: Cigarettes   ??? Smokeless tobacco: Never Used   Substance and Sexual Activity   ??? Alcohol use: No     Comment: recently has stopped   ??? Drug use: No   ??? Sexual activity: Not on file   Lifestyle   ??? Physical activity:     Days per week: Not on file     Minutes per session: Not on file   ??? Stress: Not on file   Relationships   ??? Social connections:     Talks on phone: Not on file     Gets together: Not on file     Attends religious service: Not on file     Active member of club or organization: Not on file     Attends meetings of clubs or organizations: Not on file     Relationship status: Not on file   ??? Intimate partner violence:      Fear of current or ex partner: Not on file     Emotionally abused: Not on file     Physically abused: Not on file     Forced sexual activity: Not on file   Other Topics Concern   ??? Not on file   Social History Narrative   ??? Not on file       Review of Systems   Constitutional: Positive for malaise/fatigue. Negative for chills, fever and weight loss.   Cardiovascular: Negative for chest pain and palpitations.   Gastrointestinal: Negative for abdominal pain, nausea and vomiting.   Musculoskeletal: Negative for myalgias.   Neurological: Negative for dizziness and headaches.         Objective:     Visit Vitals  BP 120/76 (BP 1 Location: Right arm, BP Patient Position: Sitting)   Pulse (!) 54   Temp 98.6 ??F (37 ??C) (Oral)   Resp 22   Ht '6\' 1"'  (1.854 m)   Wt (!) 359 lb 9.6 oz (163.1 kg)   SpO2 95%   BMI 47.44 kg/m??          Physical Exam   Constitutional: He is oriented to person, place, and time. He appears well-developed and well-nourished. No distress.   HENT:   Head: Normocephalic and atraumatic.   Neck: Normal range of motion. Neck supple.   Cardiovascular: Normal rate, regular rhythm, normal heart sounds and intact distal pulses. Exam reveals no gallop and no friction rub.   No murmur heard.  Pulmonary/Chest: Effort normal and breath sounds normal.   Abdominal: Soft. Bowel sounds are normal.   Musculoskeletal:   Swelling of the right foot with redness compared to the left.   Neurological: He is alert and oriented to person, place, and time.   Skin: Skin is warm and dry.   Corn on base of the left great toe   Psychiatric: He has a normal mood and affect. His behavior is normal. Judgment and thought content normal.   Nursing note and vitals reviewed.      Pertinent  Labs/Studies:  XR Results (most recent):  Results from Office Visit encounter on 09/08/17   XR FOOT RT MIN 3 V    Narrative EXAM: XR FOOT RT MIN 3 V    INDICATION: Right foot pain, History of Osteomyelitis.    COMPARISON: September 05, 2016     FINDINGS: Three views of the right foot demonstrate metatarsus primus varus and  hallux valgus deformity. Medial margin of the first metatarsal head and base of  the first toe proximal phalanx are surgically absent. There is a chronic  appearing deformity of the second metatarsal head and base of the second toe  proximal phalanx. There is flattening of the third metatarsal head which appears  chronic. There is a fracture of the proximal phalanx of the little toe with  periostitis. This suggests subacute injury. There is minimal gas between the  base of the first and second toes.      Impression IMPRESSION:   1. Postoperative changes to the first MTP joint   2. Favor chronic changes to the second MTP joint with widening of the second MTP  joint along its medial margin   3. Subacute fracture little toe proximal phalanx  4. Question small amount of gas between the base of the first and second toes.  If there is high clinical concern for osteomyelitis MR imaging would better  evaluate         Assessment and orders:       ICD-10-CM ICD-9-CM    1. Chronic fatigue R53.82 780.79 CBC WITH AUTOMATED DIFF      TSH 3RD GENERATION      T4, FREE      METABOLIC PANEL, COMPREHENSIVE      HEMOGLOBIN A1C WITH EAG      INSULIN-LIKE GROWTH FACTOR 1      VITAMIN B12      VITAMIN D, 25 HYDROXY   2. Polyneuropathy G62.9 356.9    3. Weight gain R63.5 783.1 TSH 3RD GENERATION      T4, FREE      METABOLIC PANEL, COMPREHENSIVE      HEMOGLOBIN A1C WITH EAG      INSULIN-LIKE GROWTH FACTOR 1   4. Right foot pain M79.671 729.5 XR FOOT RT MIN 3 V      MRI FOOT RT W WO CONT   5. Corn of foot L84 700    6. Essential hypertension, benign G95 621.3 METABOLIC PANEL, COMPREHENSIVE      LIPID PANEL   7. Pure hypercholesterolemia E78.00 272.0 LIPID PANEL   8. OSA on CPAP G47.33 327.23     Z99.89 V46.8      Diagnoses and all orders for this visit:    1. Chronic fatigue/Polyneuropathy/Weight gain: Lab evaluation for causes  of Fatigue.  Given swelling and weight gain, consideration of acromegaly as well.  -     CBC WITH AUTOMATED DIFF  -     TSH 3RD GENERATION  -     T4, FREE  -     METABOLIC PANEL, COMPREHENSIVE  -     INSULIN-LIKE GROWTH FACTOR 1  -     VITAMIN B12  -     VITAMIN D, 25 HYDROXY    2. Right foot pain: Fracture of the proximal component of the small toe, but possible gas between the 1st and 2nd toe, area of original osteomyelitis.  Patient to get emergent MRI, to be completed 6/14 at 10:00 am.  WIll follow up with results prior to patient  leaving facility.  -     XR FOOT RT MIN 3 V  -     MRI FOOT RT W WO CONT; Future    3. Corn of foot: Shaved down, discussed use of foot inserts    4. Essential hypertension, benign/Pure hypercholesterolemia/S/P CABG (coronary artery bypass graft):Labs  -     METABOLIC PANEL, COMPREHENSIVE  -     HEMOGLOBIN A1C WITH EAG  -     LIPID PANEL    5. OSA on CPAP      Follow-up and Dispositions    ?? Return in about 1 month (around 10/08/2017) for Follow up.         I have discussed the diagnosis with the patient and the intended plan as seen in the above orders.  Social history, medical history, and labs were reviewed.  The patient has received an after-visit summary and questions were answered concerning future plans.  I have discussed medication side effects and warnings with the patient as well.    Vernia Buff, MD  Resident [facility]  09/09/17    Patient discussed and seen with Dr. Donalda Ewings, Attending Physician

## 2017-09-08 NOTE — Progress Notes (Signed)
[facility]    Subjective:   Elijah [name] is a 67 y.o. male with history of Morbid obesity, BPH, CAD, HLD, HTN, OSA, polyneuropathy, Osteomyelitis, Chronic Fatigue  CC: Foot pain and swelling, Fatigue.  History provided by patient and Records    HPI:  Left foot Pain:  Pain at the base of the big toe, pressure with walking.  Large Corn on the base of the big toe.    Pain/Swelling of the Right foot:  Pain at the little toe of the right foot.  Swelling of the right foot as well with erythema.  History of osteomyelitis of the right 2nd toe.    Fatigue: Patient noting that he has had chronic fatigue with weight gain since his last visit (10 pound weight gain in past year) despite use C-pap, though notes often awakens with poor seal and dry mouth with C-pap.  Also noting swelling in his hand and difficulty getting rings off of his hands.  Fatigue despite use of C-pap as well.  Fatigue occurs primarily during the day.  Previous evaluation with normal CRP, normal liver enzymes, normal hemoglobin, essentially normal kidney function, normal TSH in 05/2016.    PFSH:     Current Outpatient Medications on File Prior to Visit   Medication Sig Dispense Refill   ??? lisinopril (PRINIVIL, ZESTRIL) 20 mg tablet TAKE 1 TABLET BY MOUTH EVERY DAY 90 Tab 1   ??? nebivolol (BYSTOLIC) 10 mg tablet TAKE 1 TABLET BY MOUTH EVERY DAY 90 Tab 1   ??? DULoxetine (CYMBALTA) 60 mg capsule Take 1 Cap by mouth daily. 90 Cap 4   ??? pregabalin (LYRICA) 300 mg capsule Take 1 Cap by mouth two (2) times a day. Max Daily Amount: 600 mg. 180 Cap 1   ??? amLODIPine (NORVASC) 5 mg tablet TAKE 1 TABLET EVERY DAY 90 Tab 2   ??? atorvastatin (LIPITOR) 40 mg tablet Take 80 mg by mouth nightly.     ??? co-enzyme Q-10 (CO Q-10) 100 mg capsule Take 100 mg by mouth nightly.     ??? Flaxseed Oil oil Take 1 Cap by mouth daily.     ??? cpap machine kit by Does Not Apply route.     ??? aspirin (ASPIRIN) 325 mg tablet Take 1 Tab by mouth daily. (Patient taking  differently: Take 81 mg by mouth daily.) 90 Tab 0   ??? DOCOSAHEXANOIC ACID/EPA (FISH OIL PO) Take 1 Cap by mouth daily.       No current facility-administered medications on file prior to visit.        Patient Active Problem List   Diagnosis Code   ??? Essential hypertension, benign I10   ??? HLD (hyperlipidemia) E78.5   ??? Coronary atherosclerosis of native coronary artery I25.10   ??? OSA on CPAP G47.33, Z99.89   ??? Morbid obesity due to excess calories (HCC) E66.01   ??? Tubular adenoma of colon D12.6   ??? Polyneuropathy G62.9   ??? Benign prostatic hyperplasia N40.0   ??? Pure hypercholesterolemia E78.00   ??? 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 Osteomyelitis ([place]) M86.9   ??? Subacute osteomyelitis of right foot ([place]) M86.271   ??? Chronic fatigue R53.82       Social History     Socioeconomic History   ??? Marital status: MARRIED     Spouse name: Not on file   ??? Number of children: Not on file   ??? Years of education: Not on file   ???  Highest education level: Not on file   Occupational History   ??? Not on file   Social Needs   ??? Financial resource strain: Not on file   ??? Food insecurity:     Worry: Not on file     Inability: Not on file   ??? Transportation needs:     Medical: Not on file     Non-medical: Not on file   Tobacco Use   ??? Smoking status: Former Smoker     Types: Cigarettes   ??? Smokeless tobacco: Never Used   Substance and Sexual Activity   ??? Alcohol use: No     Comment: recently has stopped   ??? Drug use: No   ??? Sexual activity: Not on file   Lifestyle   ??? Physical activity:     Days per week: Not on file     Minutes per session: Not on file   ??? Stress: Not on file   Relationships   ??? Social connections:     Talks on phone: Not on file     Gets together: Not on file     Attends religious service: Not on file     Active member of club or organization: Not on file     Attends meetings of clubs or organizations: Not on file     Relationship status: Not on file   ??? Intimate partner violence:     Fear of current or ex  partner: Not on file     Emotionally abused: Not on file     Physically abused: Not on file     Forced sexual activity: Not on file   Other Topics Concern   ??? Not on file   Social History Narrative   ??? Not on file       Review of Systems   Constitutional: Positive for malaise/fatigue. Negative for chills, fever and weight loss.   Cardiovascular: Negative for chest pain and palpitations.   Gastrointestinal: Negative for abdominal pain, nausea and vomiting.   Musculoskeletal: Negative for myalgias.   Neurological: Negative for dizziness and headaches.         Objective:     Visit Vitals  BP 120/76 (BP 1 Location: Right arm, BP Patient Position: Sitting)   Pulse (!) 54   Temp 98.6 ??F (37 ??C) (Oral)   Resp 22   Ht '6\' 1"'  (1.854 m)   Wt (!) 359 lb 9.6 oz (163.1 kg)   SpO2 95%   BMI 47.44 kg/m??          Physical Exam   Constitutional: He is oriented to person, place, and time. He appears well-developed and well-nourished. No distress.   HENT:   Head: Normocephalic and atraumatic.   Neck: Normal range of motion. Neck supple.   Cardiovascular: Normal rate, regular rhythm, normal heart sounds and intact distal pulses. Exam reveals no gallop and no friction rub.   No murmur heard.  Pulmonary/Chest: Effort normal and breath sounds normal.   Abdominal: Soft. Bowel sounds are normal.   Musculoskeletal:   Swelling of the right foot with redness compared to the left.   Neurological: He is alert and oriented to person, place, and time.   Skin: Skin is warm and dry.   Corn on base of the left great toe   Psychiatric: He has a normal mood and affect. His behavior is normal. Judgment and thought content normal.   Nursing note and vitals reviewed.      Pertinent  Labs/Studies:  XR Results (most recent):  Results from Office Visit encounter on 09/08/17   XR FOOT RT MIN 3 V    Narrative EXAM: XR FOOT RT MIN 3 V    INDICATION: Right foot pain, History of Osteomyelitis.    COMPARISON: September 05, 2016    FINDINGS: Three views of the right foot  demonstrate metatarsus primus varus and  hallux valgus deformity. Medial margin of the first metatarsal head and base of  the first toe proximal phalanx are surgically absent. There is a chronic  appearing deformity of the second metatarsal head and base of the second toe  proximal phalanx. There is flattening of the third metatarsal head which appears  chronic. There is a fracture of the proximal phalanx of the little toe with  periostitis. This suggests subacute injury. There is minimal gas between the  base of the first and second toes.      Impression IMPRESSION:   1. Postoperative changes to the first MTP joint   2. Favor chronic changes to the second MTP joint with widening of the second MTP  joint along its medial margin   3. Subacute fracture little toe proximal phalanx  4. Question small amount of gas between the base of the first and second toes.  If there is high clinical concern for osteomyelitis MR imaging would better  evaluate         Assessment and orders:       ICD-10-CM ICD-9-CM    1. Chronic fatigue R53.82 780.79 CBC WITH AUTOMATED DIFF      TSH 3RD GENERATION      T4, FREE      METABOLIC PANEL, COMPREHENSIVE      HEMOGLOBIN A1C WITH EAG      INSULIN-LIKE GROWTH FACTOR 1      VITAMIN B12      VITAMIN D, 25 HYDROXY   2. Polyneuropathy G62.9 356.9    3. Weight gain R63.5 783.1 TSH 3RD GENERATION      T4, FREE      METABOLIC PANEL, COMPREHENSIVE      HEMOGLOBIN A1C WITH EAG      INSULIN-LIKE GROWTH FACTOR 1   4. Right foot pain M79.671 729.5 XR FOOT RT MIN 3 V      MRI FOOT RT W WO CONT   5. Corn of foot L84 700    6. Essential hypertension, benign U93 235.5 METABOLIC PANEL, COMPREHENSIVE      LIPID PANEL   7. Pure hypercholesterolemia E78.00 272.0 LIPID PANEL   8. OSA on CPAP G47.33 327.23     Z99.89 V46.8      Diagnoses and all orders for this visit:    1. Chronic fatigue/Polyneuropathy/Weight gain: Lab evaluation for causes of Fatigue.  Given swelling and weight gain, consideration of acromegaly  as well.  -     CBC WITH AUTOMATED DIFF  -     TSH 3RD GENERATION  -     T4, FREE  -     METABOLIC PANEL, COMPREHENSIVE  -     INSULIN-LIKE GROWTH FACTOR 1  -     VITAMIN B12  -     VITAMIN D, 25 HYDROXY    2. Right foot pain: Fracture of the proximal component of the small toe, but possible gas between the 1st and 2nd toe, area of original osteomyelitis.  Patient to get emergent MRI, to be completed 6/14 at 10:00 am.  WIll follow up with results prior to patient  leaving facility.  -     XR FOOT RT MIN 3 V  -     MRI FOOT RT W WO CONT; Future    3. Corn of foot: Shaved down, discussed use of foot inserts    4. Essential hypertension, benign/Pure hypercholesterolemia/S/P CABG (coronary artery bypass graft):Labs  -     METABOLIC PANEL, COMPREHENSIVE  -     HEMOGLOBIN A1C WITH EAG  -     LIPID PANEL    5. OSA on CPAP      Follow-up and Dispositions    ?? Return in about 1 month (around 10/08/2017) for Follow up.           I have discussed the diagnosis with the patient and the intended plan as seen in the above orders.  Social history, medical history, and labs were reviewed.  The patient has received an after-visit summary and questions were answered concerning future plans.  I have discussed medication side effects and warnings with the patient as well.    Vernia Buff, MD  Resident [facility]  09/09/17    Patient discussed and seen with Dr. Donalda Ewings, Attending Physician

## 2017-09-08 NOTE — Progress Notes (Signed)
I saw and evaluated the patient with the resident, performing the key elements of the exam and service.  I discussed the findings, assessment and plan with the resident and agree with the resident's findings and plan as documented in the resident's note.  Does have a fx 5th toe and ? Gas in webspace between 1st and 2nd toes. With hx of osteomyelitis he will need MRI.    Isaac LaudSteven N. Mliss SaxSpence, M.D.

## 2017-09-08 NOTE — Progress Notes (Signed)
Evaluation for Fatigue.  Overall unremarkable except for persistent low Vitamin D.  ILGF is normal, unlikely acromegaly.  Will discuss starting Vitamin D -supplement.

## 2017-09-08 NOTE — Progress Notes (Signed)
 1. Have you been to the ER, urgent care clinic since your last visit?  Hospitalized since your last visit?No    2. Have you seen or consulted any other health care providers outside of the [facility] since your last visit?  Include any pap smears or colon screening. No  Reviewed record in preparation for visit and have necessary documentation  Pt did not bring medication to office visit for review  Information was given to pt on Advanced Directives, Living Will  Information was given on Shingles Vaccine  opportunity was given for questions  Goals that were addressed and/or need to be completed during or after this appointment include     Health Maintenance Due   Topic Date Due   . Shingrix Vaccine Age 48> (1 of 2) 12/22/1998   . FOBT Q 1 YEAR AGE 39-75  12/22/1998   . Pneumococcal 65+ years (1 of 2 - PCV13) 12/21/2013   . MEDICARE YEARLY EXAM  07/30/2016   . GLAUCOMA SCREENING Q2Y  10/27/2016

## 2017-09-09 ENCOUNTER — Inpatient Hospital Stay: Admit: 2017-09-09 | Payer: MEDICARE | Attending: Family Medicine | Primary: Family Medicine

## 2017-09-09 DIAGNOSIS — S92911A Unspecified fracture of right toe(s), initial encounter for closed fracture: Secondary | ICD-10-CM

## 2017-09-09 LAB — VITAMIN D 25 HYDROXY: Vit D, 25-Hydroxy: 22.3 ng/mL — ABNORMAL LOW (ref 30.0–100.0)

## 2017-09-09 LAB — CBC WITH AUTO DIFFERENTIAL
Basophils %: 1 %
Basophils Absolute: 0.1 10*3/uL (ref 0.0–0.2)
Eosinophils %: 4 %
Eosinophils Absolute: 0.2 10*3/uL (ref 0.0–0.4)
Granulocyte Absolute Count: 0 10*3/uL (ref 0.0–0.1)
Hematocrit: 42.4 % (ref 37.5–51.0)
Hemoglobin: 14.5 g/dL (ref 13.0–17.7)
Immature Granulocytes: 0 %
Lymphocytes %: 30 %
Lymphocytes Absolute: 1.7 10*3/uL (ref 0.7–3.1)
MCH: 31.4 pg (ref 26.6–33.0)
MCHC: 34.2 g/dL (ref 31.5–35.7)
MCV: 92 fL (ref 79–97)
Monocytes %: 11 %
Monocytes Absolute: 0.6 10*3/uL (ref 0.1–0.9)
Neutrophils %: 54 %
Neutrophils Absolute: 3.2 10*3/uL (ref 1.4–7.0)
Platelets: 249 10*3/uL (ref 150–450)
RBC: 4.62 x10E6/uL (ref 4.14–5.80)
RDW: 15 % (ref 12.3–15.4)
WBC: 5.9 10*3/uL (ref 3.4–10.8)

## 2017-09-09 LAB — COMPREHENSIVE METABOLIC PANEL
ALT: 20 IU/L (ref 0–44)
AST: 20 IU/L (ref 0–40)
Albumin/Globulin Ratio: 1.4 NA (ref 1.2–2.2)
Albumin: 3.9 g/dL (ref 3.6–4.8)
Alkaline Phosphatase: 71 IU/L (ref 39–117)
BUN: 20 mg/dL (ref 8–27)
Bun/Cre Ratio: 19 NA (ref 10–24)
CO2: 21 mmol/L (ref 20–29)
Calcium: 9.3 mg/dL (ref 8.6–10.2)
Chloride: 110 mmol/L — ABNORMAL HIGH (ref 96–106)
Creatinine: 1.08 mg/dL (ref 0.76–1.27)
EGFR IF NonAfrican American: 70 mL/min/{1.73_m2} (ref >59)
GFR African American: 81 mL/min/{1.73_m2} (ref >59)
Globulin, Total: 2.8 g/dL (ref 1.5–4.5)
Glucose: 95 mg/dL (ref 65–99)
Potassium: 4.5 mmol/L (ref 3.5–5.2)
Sodium: 143 mmol/L (ref 134–144)
Total Bilirubin: 0.6 mg/dL (ref 0.0–1.2)
Total Protein: 6.7 g/dL (ref 6.0–8.5)

## 2017-09-09 LAB — INSULIN-LIKE GROWTH FACTOR 1
INSULIN-LIKE GROWTH FACTOR I, 010369: 146 ng/mL (ref 47–192)
Insulin-Like Growth Factor I: 146 ng/mL (ref 47–192)

## 2017-09-09 LAB — VITAMIN B12
Vitamin B-12: 285 pg/mL (ref 232–1245)
Vitamin B12: 285 pg/mL (ref 232–1245)

## 2017-09-09 LAB — TSH 3RD GENERATION
TSH: 1.77 u[IU]/mL (ref 0.450–4.500)
TSH: 1.77 u[IU]/mL (ref 0.450–4.500)

## 2017-09-09 LAB — HEMOGLOBIN A1C W/EAG
Hemoglobin A1C: 5.2 % (ref 4.8–5.6)
eAG: 103 mg/dL

## 2017-09-09 LAB — T4, FREE
T4 Free: 0.94 ng/dL (ref 0.82–1.77)
T4, Free: 0.94 ng/dL (ref 0.82–1.77)

## 2017-09-09 LAB — CBC WITH AUTOMATED DIFF
ABS. BASOPHILS: 0.1 10*3/uL (ref 0.0–0.2)
ABS. EOSINOPHILS: 0.2 10*3/uL (ref 0.0–0.4)
ABS. IMM. GRANS.: 0 10*3/uL (ref 0.0–0.1)
ABS. MONOCYTES: 0.6 10*3/uL (ref 0.1–0.9)
ABS. NEUTROPHILS: 3.2 10*3/uL (ref 1.4–7.0)
Abs Lymphocytes: 1.7 10*3/uL (ref 0.7–3.1)
BASOPHILS: 1 %
EOSINOPHILS: 4 %
HCT: 42.4 % (ref 37.5–51.0)
HGB: 14.5 g/dL (ref 13.0–17.7)
IMMATURE GRANULOCYTES: 0 %
Lymphocytes: 30 %
MCH: 31.4 pg (ref 26.6–33.0)
MCHC: 34.2 g/dL (ref 31.5–35.7)
MCV: 92 fL (ref 79–97)
MONOCYTES: 11 %
NEUTROPHILS: 54 %
PLATELET: 249 10*3/uL (ref 150–450)
RBC: 4.62 x10E6/uL (ref 4.14–5.80)
RDW: 15 % (ref 12.3–15.4)
WBC: 5.9 10*3/uL (ref 3.4–10.8)

## 2017-09-09 LAB — METABOLIC PANEL, COMPREHENSIVE
A-G Ratio: 1.4 (ref 1.2–2.2)
ALT (SGPT): 20 IU/L (ref 0–44)
AST (SGOT): 20 IU/L (ref 0–40)
Albumin: 3.9 g/dL (ref 3.6–4.8)
Alk. phosphatase: 71 IU/L (ref 39–117)
BUN/Creatinine ratio: 19 (ref 10–24)
BUN: 20 mg/dL (ref 8–27)
Bilirubin, total: 0.6 mg/dL (ref 0.0–1.2)
CO2: 21 mmol/L (ref 20–29)
Calcium: 9.3 mg/dL (ref 8.6–10.2)
Chloride: 110 mmol/L — ABNORMAL HIGH (ref 96–106)
Creatinine: 1.08 mg/dL (ref 0.76–1.27)
GFR est AA: 81 mL/min/{1.73_m2} (ref >59)
GFR est non-AA: 70 mL/min/{1.73_m2} (ref >59)
GLOBULIN, TOTAL: 2.8 g/dL (ref 1.5–4.5)
Glucose: 95 mg/dL (ref 65–99)
Potassium: 4.5 mmol/L (ref 3.5–5.2)
Protein, total: 6.7 g/dL (ref 6.0–8.5)
Sodium: 143 mmol/L (ref 134–144)

## 2017-09-09 LAB — HEMOGLOBIN A1C WITH EAG
Estimated average glucose: 103 mg/dL
Hemoglobin A1c: 5.2 % (ref 4.8–5.6)

## 2017-09-09 LAB — VITAMIN D, 25 HYDROXY: VITAMIN D, 25-HYDROXY: 22.3 ng/mL — ABNORMAL LOW (ref 30.0–100.0)

## 2017-09-09 MED ORDER — GADOTERATE MEGLUMINE 0.5 MMOL/ML IV SOLUTION
0.5 mmol/mL (376.9 mg/mL) | Freq: Once | INTRAVENOUS | Status: AC
Start: 2017-09-09 — End: 2017-09-09
  Administered 2017-09-09: 15:00:00 via INTRAVENOUS

## 2017-09-09 NOTE — Telephone Encounter (Signed)
Spoke to patient regarding MRI results.  No osteomyelitis detected.    Elijah CabalWilliam B Adara Kittle, MD  Resident [facility]  09/09/17

## 2017-09-09 NOTE — Telephone Encounter (Signed)
-----   Message from Maryann Connersammy K Spicer sent at 09/09/2017 12:01 PM EDT -----  Regarding: Dr. Maralyn SagoSpence/Telephone   Pt is requesting a call back regarding a missed call from the practice. Best contact is [phone].

## 2017-09-15 ENCOUNTER — Telehealth

## 2017-09-15 MED ORDER — CYANOCOBALAMIN 500 MCG TAB
500 mcg | ORAL_TABLET | Freq: Every day | ORAL | 3 refills | Status: DC
Start: 2017-09-15 — End: 2018-06-13

## 2017-09-15 MED ORDER — CHOLECALCIFEROL (VITAMIN D3) 5,000 UNIT TABLET
ORAL_TABLET | Freq: Every day | ORAL | 3 refills | Status: DC
Start: 2017-09-15 — End: 2018-06-02

## 2017-09-15 NOTE — Telephone Encounter (Signed)
Called patient and discussed lab results.  Patient to start Vitamin B12 and Vitamin D supplements.  Repeat B12 and Vitamin D labs in 2-3 months.    Altamese CabalWilliam B Vickki Igou, MD  Resident [facility]  09/15/17

## 2017-09-26 ENCOUNTER — Ambulatory Visit: Attending: Family Medicine | Primary: Family Medicine

## 2017-09-26 ENCOUNTER — Encounter: Admit: 2017-09-26 | Primary: Family Medicine

## 2017-09-26 ENCOUNTER — Ambulatory Visit: Admit: 2017-09-26 | Payer: MEDICARE | Attending: Family Medicine | Primary: Family Medicine

## 2017-09-26 DIAGNOSIS — M79661 Pain in right lower leg: Secondary | ICD-10-CM

## 2017-09-26 MED ORDER — DICLOFENAC 50 MG TAB, DELAYED RELEASE
50 mg | ORAL_TABLET | Freq: Two times a day (BID) | ORAL | 1 refills | Status: DC | PRN
Start: 2017-09-26 — End: 2017-12-28

## 2017-09-26 NOTE — Progress Notes (Signed)
CC: Right leg pain    HPI: Pt is a 67 y.o. male who presents for right leg pain. Pt states that about 3 days ago his right knee gave out on him when he was carrying groceries up the stairs. His knee fell and hit the stair but he did not completely fall down. Now he has pain in his proximal lateral tibia that is non-radiating. Pain is worse with standing up and climbing stairs and he has been using a crutch to walk. He has tried ice and Aleve which may have helped a little. He has a h/o several broken bones in that leg but is not sure which ones he has broken. 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       Past Medical History:   Diagnosis Date   ??? BPH (benign prostatic hyperplasia)    ??? CAD (coronary artery disease)    ??? History of vascular access device 09/08/2016    [facility] VAT, 5 FR double, R brachial, 48 cm with 1 cm out, LTA   ??? HLD (hyperlipidemia)    ??? HTN (hypertension)    ??? Morbid obesity due to excess calories ([place]) 04/24/2015   ??? OSA (obstructive sleep apnea)    ??? Polyneuropathy    ??? Tubular adenoma of colon     x2 on colonoscopy 09/18/15       Family History   Problem Relation Age of Onset   ??? Stroke Mother    ??? Other Father         mrsa infxn caused death   ??? Heart Attack Brother        Social History     Tobacco Use   ??? Smoking status: Former Smoker     Types: Cigarettes   ??? Smokeless tobacco: Never Used   Substance Use Topics   ??? Alcohol use: No     Comment: recently has stopped   ??? Drug use: No       ROS:  Positive only when bolded  Constitutional: F/C  Hematologic/lymphatic: LEE (chroinc, R lower leg since osteomyelitis)  Musculoskeletal: Myalgias, arthralgias    PE:  Visit Vitals  BP 107/67 (BP 1 Location: Right arm, BP Patient Position: Sitting)   Pulse 64   Temp 97.6 ??F (36.4 ??C) (Oral)   Resp 16   Ht '6\' 1"'$  (1.854 m)   Wt 343 lb (155.6 kg)   SpO2 96%   BMI 45.25 kg/m??     Gen: Pt sitting in chair, in NAD  Head: Normocephalic, atraumatic   Eyes: Sclera anicteric, EOM grossly intact, PERRL  Throat: MMM, normal lips, tongue and gums  Neck: Supple  CVS: Normal S1, S2, no m/r/g  Resp: CTAB, no wheezes or rales  Extrem: Atraumatic, no cyanosis. Compression stocking in place on R foot to the shin.   MSK: No edema, erythema, warmth or bony abnormality of the R knee joint. No TTP of the knee joint or tibial crest. No TTP of the lateral tibial condyle or head of the fibula.   Pulses: 2+   Skin: Warm, dry  Neuro: Alert, oriented, appropriate    XR R Tib/fib: I personally reviewed and interpreted this film. Apparent old fracture with bony callus formation just below neck of fibula. No acute changes.     A/P: Pt is a 67 y.o. male who presents for R shin pain after fall. No clear cause for pain on XR, possibly related to deep  muscle bruise from fall  - Diclofenac 26m BID prn for pain  - Can try heating pads  - Pt to call if pain worsens or fails to improve in the next 1-2 weeks  - RTC prn  .    Discussed diagnoses in detail with patient.   Medication risks/benefits/side effects discussed with patient.   All of the patient's questions were addressed. The patient understands and agrees with our plan of care.  The patient knows to call back if they are unsure of or forget any changes we discussed today or if the symptoms change.  The patient received an After-Visit Summary which contains VS, orders, medication list and allergy list. This can be used as a "mini-medical record" should they have to seek medical care while out of town.    Current Outpatient Medications on File Prior to Visit   Medication Sig Dispense Refill   ??? cyanocobalamin (VITAMIN B12) 500 mcg tablet Take 1 Tab by mouth daily. 90 Tab 3   ??? cholecalciferol (VITAMIN D3) 5,000 unit tab tablet Take 1 Tab by mouth daily. 90 Tab 3   ??? lisinopril (PRINIVIL, ZESTRIL) 20 mg tablet TAKE 1 TABLET BY MOUTH EVERY DAY 90 Tab 1   ??? nebivolol (BYSTOLIC) 10 mg tablet TAKE 1 TABLET BY MOUTH EVERY DAY 90 Tab 1    ??? DULoxetine (CYMBALTA) 60 mg capsule Take 1 Cap by mouth daily. 90 Cap 4   ??? pregabalin (LYRICA) 300 mg capsule Take 1 Cap by mouth two (2) times a day. Max Daily Amount: 600 mg. 180 Cap 1   ??? amLODIPine (NORVASC) 5 mg tablet TAKE 1 TABLET EVERY DAY 90 Tab 2   ??? atorvastatin (LIPITOR) 40 mg tablet Take 80 mg by mouth nightly.     ??? co-enzyme Q-10 (CO Q-10) 100 mg capsule Take 100 mg by mouth nightly.     ??? Flaxseed Oil oil Take 1 Cap by mouth daily.     ??? cpap machine kit by Does Not Apply route.     ??? aspirin (ASPIRIN) 325 mg tablet Take 1 Tab by mouth daily. (Patient taking differently: Take 81 mg by mouth daily.) 90 Tab 0   ??? DOCOSAHEXANOIC ACID/EPA (FISH OIL PO) Take 1 Cap by mouth daily.       No current facility-administered medications on file prior to visit.

## 2017-09-26 NOTE — Patient Instructions (Signed)
A Healthy Lifestyle: Care Instructions  Your Care Instructions    A healthy lifestyle can help you feel good, stay at a healthy weight, and have plenty of energy for both work and play. A healthy lifestyle is something you can share with your whole family.  A healthy lifestyle also can lower your risk for serious health problems, such as high blood pressure, heart disease, and diabetes.  You can follow a few steps listed below to improve your health and the health of your family.  Follow-up care is a key part of your treatment and safety. Be sure to make and go to all appointments, and call your doctor if you are having problems. It's also a good idea to know your test results and keep a list of the medicines you take.  How can you care for yourself at home?  ?? Do not eat too much sugar, fat, or fast foods. You can still have dessert and treats now and then. The goal is moderation.  ?? Start small to improve your eating habits. Pay attention to portion sizes, drink less juice and soda pop, and eat more fruits and vegetables.  ? Eat a healthy amount of food. A 3-ounce serving of meat, for example, is about the size of a deck of cards. Fill the rest of your plate with vegetables and whole grains.  ? Limit the amount of soda and sports drinks you have every day. Drink more water when you are thirsty.  ? Eat at least 5 servings of fruits and vegetables every day. It may seem like a lot, but it is not hard to reach this goal. A serving or helping is 1 piece of fruit, 1 cup of vegetables, or 2 cups of leafy, raw vegetables. Have an apple or some carrot sticks as an afternoon snack instead of a candy bar. Try to have fruits and/or vegetables at every meal.  ?? Make exercise part of your daily routine. You may want to start with simple activities, such as walking, bicycling, or slow swimming. Try to be active 30 to 60 minutes every day. You do not need to do all 30 to 60  minutes all at once. For example, you can exercise 3 times a day for 10 or 20 minutes. Moderate exercise is safe for most people, but it is always a good idea to talk to your doctor before starting an exercise program.  ?? Keep moving. Mow the lawn, work in the garden, or clean your house. Take the stairs instead of the elevator at work.  ?? If you smoke, quit. People who smoke have an increased risk for heart attack, stroke, cancer, and other lung illnesses. Quitting is hard, but there are ways to boost your chance of quitting tobacco for good.  ? Use nicotine gum, patches, or lozenges.  ? Ask your doctor about stop-smoking programs and medicines.  ? Keep trying.  In addition to reducing your risk of diseases in the future, you will notice some benefits soon after you stop using tobacco. If you have shortness of breath or asthma symptoms, they will likely get better within a few weeks after you quit.  ?? Limit how much alcohol you drink. Moderate amounts of alcohol (up to 2 drinks a day for men, 1 drink a day for women) are okay. But drinking too much can lead to liver problems, high blood pressure, and other health problems.  Family health  If you have a family, there are many things you   can do together to improve your health.  ?? Eat meals together as a family as often as possible.  ?? Eat healthy foods. This includes fruits, vegetables, lean meats and dairy, and whole grains.  ?? Include your family in your fitness plan. Most people think of activities such as jogging or tennis as the way to fitness, but there are many ways you and your family can be more active. Anything that makes you breathe hard and gets your heart pumping is exercise. Here are some tips:  ? Walk to do errands or to take your child to school or the bus.  ? Go for a family bike ride after dinner instead of watching TV.  Where can you learn more?  Go to http://www.healthwise.net/GoodHelpConnections.   Enter U807 in the search box to learn more about "A Healthy Lifestyle: Care Instructions."  Current as of: December 07, 2016  Content Version: 11.9  ?? 2006-2018 Healthwise[employer]. Care instructions adapted under license by [employer] (which disclaims liability or warranty for this information). If you have questions about a medical condition or this instruction, always ask your healthcare professional. Healthwise, Incorporated disclaims any warranty or liability for your use of this information.

## 2017-09-26 NOTE — Progress Notes (Signed)
1. Have you been to the ER, urgent care clinic since your last visit?  Hospitalized since your last visit?No    2. Have you seen or consulted any other health care providers outside of the [facility] since your last visit?  Include any pap smears or colon screening. No  Reviewed record in preparation for visit and have necessary documentation  Pt did not bring medication to office visit for review    Goals that were addressed and/or need to be completed during or after this appointment include     Health Maintenance Due   Topic Date Due   ??? Shingrix Vaccine Age 50> (1 of 2) 12/22/1998   ??? FOBT Q 1 YEAR AGE 50-75  12/22/1998   ??? Pneumococcal 65+ years (1 of 2 - PCV13) 12/21/2013   ??? MEDICARE YEARLY EXAM  07/30/2016   ??? GLAUCOMA SCREENING Q2Y  10/27/2016

## 2017-09-26 NOTE — Progress Notes (Signed)
CC: Right leg pain    HPI: Pt is a 67 y.o. male who presents for right leg pain. Pt states that about 3 days ago his right knee gave out on him when he was carrying groceries up the stairs. His knee fell and hit the stair but he did not completely fall down. Now he has pain in his proximal lateral tibia that is non-radiating. Pain is worse with standing up and climbing stairs and he has been using a crutch to walk. He has tried ice and Aleve which may have helped a little. He has a h/o several broken bones in that leg but is not sure which ones he has broken. 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       Past Medical History:   Diagnosis Date   ??? BPH (benign prostatic hyperplasia)    ??? CAD (coronary artery disease)    ??? History of vascular access device 09/08/2016    [facility] VAT, 5 FR double, R brachial, 48 cm with 1 cm out, LTA   ??? HLD (hyperlipidemia)    ??? HTN (hypertension)    ??? Morbid obesity due to excess calories ([place]) 04/24/2015   ??? OSA (obstructive sleep apnea)    ??? Polyneuropathy    ??? Tubular adenoma of colon     x2 on colonoscopy 09/18/15       Family History   Problem Relation Age of Onset   ??? Stroke Mother    ??? Other Father         mrsa infxn caused death   ??? Heart Attack Brother        Social History     Tobacco Use   ??? Smoking status: Former Smoker     Types: Cigarettes   ??? Smokeless tobacco: Never Used   Substance Use Topics   ??? Alcohol use: No     Comment: recently has stopped   ??? Drug use: No       ROS:  Positive only when bolded  Constitutional: F/C  Hematologic/lymphatic: LEE (chroinc, R lower leg since osteomyelitis)  Musculoskeletal: Myalgias, arthralgias    PE:  Visit Vitals  BP 107/67 (BP 1 Location: Right arm, BP Patient Position: Sitting)   Pulse 64   Temp 97.6 ??F (36.4 ??C) (Oral)   Resp 16   Ht '6\' 1"'  (1.854 m)   Wt 343 lb (155.6 kg)   SpO2 96%   BMI 45.25 kg/m??     Gen: Pt sitting in chair, in NAD  Head: Normocephalic, atraumatic  Eyes: Sclera anicteric, EOM  grossly intact, PERRL  Throat: MMM, normal lips, tongue and gums  Neck: Supple  CVS: Normal S1, S2, no m/r/g  Resp: CTAB, no wheezes or rales  Extrem: Atraumatic, no cyanosis. Compression stocking in place on R foot to the shin.   MSK: No edema, erythema, warmth or bony abnormality of the R knee joint. No TTP of the knee joint or tibial crest. No TTP of the lateral tibial condyle or head of the fibula.   Pulses: 2+   Skin: Warm, dry  Neuro: Alert, oriented, appropriate    XR R Tib/fib: I personally reviewed and interpreted this film. Apparent old fracture with bony callus formation just below neck of fibula. No acute changes.     A/P: Pt is a 67 y.o. male who presents for R shin pain after fall. No clear cause for pain on XR, possibly related to deep  muscle bruise from fall  - Diclofenac 72m BID prn for pain  - Can try heating pads  - Pt to call if pain worsens or fails to improve in the next 1-2 weeks  - RTC prn  .    Discussed diagnoses in detail with patient.   Medication risks/benefits/side effects discussed with patient.   All of the patient's questions were addressed. The patient understands and agrees with our plan of care.  The patient knows to call back if they are unsure of or forget any changes we discussed today or if the symptoms change.  The patient received an After-Visit Summary which contains VS, orders, medication list and allergy list. This can be used as a "mini-medical record" should they have to seek medical care while out of town.    Current Outpatient Medications on File Prior to Visit   Medication Sig Dispense Refill   ??? cyanocobalamin (VITAMIN B12) 500 mcg tablet Take 1 Tab by mouth daily. 90 Tab 3   ??? cholecalciferol (VITAMIN D3) 5,000 unit tab tablet Take 1 Tab by mouth daily. 90 Tab 3   ??? lisinopril (PRINIVIL, ZESTRIL) 20 mg tablet TAKE 1 TABLET BY MOUTH EVERY DAY 90 Tab 1   ??? nebivolol (BYSTOLIC) 10 mg tablet TAKE 1 TABLET BY MOUTH EVERY DAY 90 Tab 1   ??? DULoxetine (CYMBALTA) 60 mg  capsule Take 1 Cap by mouth daily. 90 Cap 4   ??? pregabalin (LYRICA) 300 mg capsule Take 1 Cap by mouth two (2) times a day. Max Daily Amount: 600 mg. 180 Cap 1   ??? amLODIPine (NORVASC) 5 mg tablet TAKE 1 TABLET EVERY DAY 90 Tab 2   ??? atorvastatin (LIPITOR) 40 mg tablet Take 80 mg by mouth nightly.     ??? co-enzyme Q-10 (CO Q-10) 100 mg capsule Take 100 mg by mouth nightly.     ??? Flaxseed Oil oil Take 1 Cap by mouth daily.     ??? cpap machine kit by Does Not Apply route.     ??? aspirin (ASPIRIN) 325 mg tablet Take 1 Tab by mouth daily. (Patient taking differently: Take 81 mg by mouth daily.) 90 Tab 0   ??? DOCOSAHEXANOIC ACID/EPA (FISH OIL PO) Take 1 Cap by mouth daily.       No current facility-administered medications on file prior to visit.

## 2017-09-26 NOTE — Progress Notes (Signed)
 1. Have you been to the ER, urgent care clinic since your last visit?  Hospitalized since your last visit?No    2. Have you seen or consulted any other health care providers outside of the [facility] since your last visit?  Include any pap smears or colon screening. No  Reviewed record in preparation for visit and have necessary documentation  Pt did not bring medication to office visit for review    Goals that were addressed and/or need to be completed during or after this appointment include     Health Maintenance Due   Topic Date Due   . Shingrix Vaccine Age 69> (1 of 2) 12/22/1998   . FOBT Q 1 YEAR AGE 56-75  12/22/1998   . Pneumococcal 65+ years (1 of 2 - PCV13) 12/21/2013   . MEDICARE YEARLY EXAM  07/30/2016   . GLAUCOMA SCREENING Q2Y  10/27/2016

## 2017-09-27 NOTE — Telephone Encounter (Signed)
Called to advise pt of recent results. Line is busy. XR read by Radiology as normal. Will send pt a message on MyChart.

## 2017-10-04 ENCOUNTER — Encounter

## 2017-10-04 MED ORDER — PREGABALIN 300 MG CAP
300 mg | ORAL_CAPSULE | Freq: Two times a day (BID) | ORAL | 1 refills | Status: DC
Start: 2017-10-04 — End: 2018-03-08

## 2017-10-04 NOTE — Telephone Encounter (Signed)
-----   Message from Maryann Connersammy K Spicer sent at 10/04/2017  3:23 PM EDT -----  Regarding: Dr. Mickeal NeedyEsquivel/Telephone    [facility] Message/Vendor Calls    Caller's first and last name: Elijah [name]      Reason for call: Requesting a call back regarding getting a referral for a MRI of his R knee       Call back required yes/no and why: Yes       Best contact number(s): [phone]         Details to clarify the request:      Tammy Jori MollK Spicer

## 2017-10-04 NOTE — Telephone Encounter (Signed)
Returned phone call to patient. He states that he would like to have an [place] of [place]. He says that [place] [facility] [place] and would like to have it done as soon as possible. Please advise, thank you.

## 2017-10-04 NOTE — Telephone Encounter (Signed)
Refill request received from patient.  Last office visit was 09/26/17. He can be reached at [phone].

## 2017-10-04 NOTE — Telephone Encounter (Signed)
-----   Message from Smith[place]haday B Samy sent at 10/04/2017  2:13 PM EDT -----  Regarding: Dr. Beckey RutterEsquivel/Refill  Chris B, with [facility] (in [place], [place] is stating that the Rx for "Lyrica 300 MG" was sent to the incorrect pharmacy ([facility] in [place]). Thayer OhmChris also stated that [facility] would not transfer the Rx due to this being a controlled substance. Thayer OhmChris is requesting the Rx be cancelled and sent to [facility]. Thayer OhmChris best contact number [phone], [phone].

## 2017-10-04 NOTE — Telephone Encounter (Signed)
[facility] [place]. [facility]ed to [facility].

## 2017-10-04 NOTE — Telephone Encounter (Signed)
Triage for Controlled Substance Refill Request    Pain Diagnosis: Chronic Pain    Last Outpatient Visit:09/26/17    Next Outpatient Visit:None    Reason for refill needed outside of office visit?prescribed only on a monthly basis    Pain escalation requiring increased medication- no    Pharmacy: See pain contract    Medication: See Prescription requested      PMP reviewed:today by Dr. Mliss SaxSpence and filed in PMP folder, separate from chart    Date of Urine Drug Screen:  None      Opioid Safety Handout given: Always prints with the Patient AVS    Appropriate for refill: yes    Action:  Rx sent to Dr. Mliss SaxSpence

## 2017-10-05 ENCOUNTER — Encounter

## 2017-10-05 NOTE — Telephone Encounter (Signed)
Patient was seen on 09/26/17 by Dr. Sanda KleinEsquivel for right leg pain. Per her note: "HPI: Pt is a 67 y.o. male who presents for right leg pain. Pt states that about 3 days ago his right knee gave out on him when he was carrying groceries up the stairs. His knee fell and hit the stair but he did not completely fall down. Now he has pain in his proximal lateral tibia that is non-radiating. Pain is worse with standing up and climbing stairs and he has been using a crutch to walk. He has tried ice and Aleve which may have helped a little. He has a h/o several broken bones in that leg but is not sure which ones he has broken. 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

## 2017-10-07 NOTE — Telephone Encounter (Signed)
Patient has been scheduled for a [facility] on October 11, 2017 at 7:45.  Order faxed and copy of notification stating no prior authorization was required faxed.

## 2017-10-12 ENCOUNTER — Ambulatory Visit

## 2017-10-12 ENCOUNTER — Encounter

## 2017-10-12 NOTE — Progress Notes (Signed)
I called the patient to discuss his MRI findings of his right knee.  He says his pain is 50% better than when it gave way with him.  He has a torn lateral meniscus with apparent loose fragment.  His ligaments are intact.  He has a chondral fissure but no fracture.  He has patellofemoral arthritis.    He has a trip to [place] planned and also plans to [place]  I will set him up an orthopedic consult for about 3 weeks and he will continue his Naprosyn and use a heating pad or warm moist compresses for pain relief.  He is already on Lyrica and Cymbalta for his neuropathic pain.  I cannot add a narcotic pain medicine to that regimen.  Thank you,  Dr. Bricelyn Freestone

## 2017-10-12 NOTE — Progress Notes (Signed)
I called the patient to discuss his MRI findings of his right knee.  He says his pain is 50% better than when it gave way with him.  He has a torn lateral meniscus with apparent loose fragment.  His ligaments are intact.  He has a chondral fissure but no fracture.  He has patellofemoral arthritis.    [place] and also [place]  I will set him up an orthopedic consult for about 3 weeks and he will continue his Naprosyn and use a heating pad or warm moist compresses for pain relief.  He is already on Lyrica and Cymbalta for his neuropathic pain.  I cannot add a narcotic pain medicine to that regimen.  Thank you,  Dr. Mliss SaxSpence

## 2017-10-31 MED ORDER — AMLODIPINE 5 MG TAB
5 mg | ORAL_TABLET | ORAL | 0 refills | Status: DC
Start: 2017-10-31 — End: 2017-11-09

## 2017-10-31 NOTE — Telephone Encounter (Signed)
Patient is completely out of this medication. Please advise since Dr. Mliss SaxSpence is out of the office. Thank you.    Last office visit on 09/26/17  Last labs 09/08/17

## 2017-11-03 ENCOUNTER — Ambulatory Visit: Attending: Family Medicine | Primary: Family Medicine

## 2017-11-03 ENCOUNTER — Ambulatory Visit: Admit: 2017-11-03 | Discharge: 2017-11-03 | Payer: MEDICARE | Attending: Family Medicine | Primary: Family Medicine

## 2017-11-03 DIAGNOSIS — L97511 Non-pressure chronic ulcer of other part of right foot limited to breakdown of skin: Secondary | ICD-10-CM

## 2017-11-03 MED ORDER — CLINDAMYCIN 300 MG CAP
300 mg | ORAL_CAPSULE | Freq: Three times a day (TID) | ORAL | 0 refills | Status: AC
Start: 2017-11-03 — End: 2017-11-13

## 2017-11-03 NOTE — Progress Notes (Signed)
[facility]    Subjective:   Elijah [name] is a 67 y.o. male with history of HTN, HLD, CAD, polyneuropathy, BPH, OSA on CPAP, osteomyelitis  CC: Right foot infection  History provided by patient    HPI:    Patient presents to clinic with complaint of possible toe infection. He states that in the last 1-2 weeks, he has noted "bad smell" coming from right foot. Yesterday, he noticed blood behind right toes. Since he has no sensation to foot, he decided to come to PCP for better evaluation. Patient has a history of osteomyelitis on same foot, so he is very concerned he might have another infection. No recent known trauma or falls. Denies fever, chills, night sweats, SOB, N/V, abdominal pain. No other complaint.    Current Outpatient Medications on File Prior to Visit   Medication Sig Dispense Refill   ??? amLODIPine (NORVASC) 5 mg tablet TAKE 1 TABLET EVERY DAY 90 Tab 0   ??? pregabalin (LYRICA) 300 mg capsule Take 1 Cap by mouth two (2) times a day. Max Daily Amount: 600 mg. 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 diclofenac EC (VOLTAREN) 50 mg EC tablet Take 1 Tab by mouth two (2) times daily as needed for Pain. 60 Tab 1   ??? cyanocobalamin (VITAMIN B12) 500 mcg tablet Take 1 Tab by mouth daily. 90 Tab 3   ??? cholecalciferol (VITAMIN D3) 5,000 unit tab tablet Take 1 Tab by mouth daily. 90 Tab 3   ??? lisinopril (PRINIVIL, ZESTRIL) 20 mg tablet TAKE 1 TABLET BY MOUTH EVERY DAY 90 Tab 1   ??? nebivolol (BYSTOLIC) 10 mg tablet TAKE 1 TABLET BY MOUTH EVERY DAY 90 Tab 1   ??? DULoxetine (CYMBALTA) 60 mg capsule Take 1 Cap by mouth daily. 90 Cap 4   ??? atorvastatin (LIPITOR) 40 mg tablet Take 80 mg by mouth nightly.     ??? co-enzyme Q-10 (CO Q-10) 100 mg capsule Take 100 mg by mouth nightly.     ??? Flaxseed Oil oil Take 1 Cap by mouth daily.     ??? cpap machine kit by Does Not Apply route.     ??? aspirin (ASPIRIN) 325 mg tablet Take 1 Tab by mouth daily. (Patient  taking differently: Take 81 mg by mouth daily.) 90 Tab 0   ??? DOCOSAHEXANOIC ACID/EPA (FISH OIL PO) Take 1 Cap by mouth daily.       No current facility-administered medications on file prior to visit.        Patient Active Problem List   Diagnosis Code   ??? Essential hypertension, benign I10   ??? HLD (hyperlipidemia) E78.5   ??? Coronary atherosclerosis of native coronary artery I25.10   ??? OSA on CPAP G47.33, Z99.89   ??? Morbid obesity due to excess calories (HCC) E66.01   ??? Tubular adenoma of colon D12.6   ??? Polyneuropathy G62.9   ??? Benign prostatic hyperplasia N40.0   ??? Pure hypercholesterolemia E78.00   ??? 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 Osteomyelitis ([place]) M86.9   ??? Subacute osteomyelitis of right foot ([place]) M86.271   ??? Chronic fatigue R53.82       Social History     Socioeconomic History   ??? Marital status: MARRIED     Spouse name: Not on file   ??? Number of children: Not on file   ??? Years of education: Not on file   ???  Highest education level: Not on file   Occupational History   ??? Not on file   Social Needs   ??? Financial resource strain: Not on file   ??? Food insecurity:     Worry: Not on file     Inability: Not on file   ??? Transportation needs:     Medical: Not on file     Non-medical: Not on file   Tobacco Use   ??? Smoking status: Former Smoker     Types: Cigarettes   ??? Smokeless tobacco: Never Used   Substance and Sexual Activity   ??? Alcohol use: No     Comment: recently has stopped   ??? Drug use: No   ??? Sexual activity: Not on file   Lifestyle   ??? Physical activity:     Days per week: Not on file     Minutes per session: Not on file   ??? Stress: Not on file   Relationships   ??? Social connections:     Talks on phone: Not on file     Gets together: Not on file     Attends religious service: Not on file     Active member of club or organization: Not on file     Attends meetings of clubs or organizations: Not on file     Relationship status: Not on file   ??? Intimate partner violence:      Fear of current or ex partner: Not on file     Emotionally abused: Not on file     Physically abused: Not on file     Forced sexual activity: Not on file   Other Topics Concern   ??? Not on file   Social History Narrative   ??? Not on file       Review of Systems   Constitutional: Negative for chills and fever.   HENT: Negative for congestion.    Eyes: Negative for blurred vision.   Respiratory: Negative for shortness of breath.    Cardiovascular: Negative for chest pain and palpitations.   Gastrointestinal: Negative for abdominal pain, nausea and vomiting.   Genitourinary: Negative for dysuria.   Musculoskeletal: Negative for myalgias.   Skin: Negative for itching.        Malodorous discharge and bleeding on right foot   Neurological: Negative for dizziness and headaches.   Psychiatric/Behavioral: Negative for substance abuse.         Objective:     Visit Vitals  BP 146/83 (BP 1 Location: Left arm, BP Patient Position: Sitting)   Pulse (!) 56   Temp 98.3 ??F (36.8 ??C) (Oral)   Resp 20   Ht '6\' 1"'  (1.854 m)   Wt (!) 352 lb (159.7 kg)   SpO2 96%   BMI 46.44 kg/m??        Physical Exam   Constitutional: He is oriented to person, place, and time. He appears well-developed. No distress.   HENT:   Head: Normocephalic and atraumatic.   Eyes: Conjunctivae and EOM are normal.   Neck: Normal range of motion. Neck supple.   Cardiovascular: Normal rate, regular rhythm and normal heart sounds.   Pulmonary/Chest: Effort normal and breath sounds normal. No respiratory distress. He has no wheezes.   Abdominal: Soft. Bowel sounds are normal. He exhibits no distension. There is no tenderness.   Musculoskeletal: Normal range of motion. He exhibits no tenderness.   Neurological: He is alert and oriented to person, place, and time.  Skin: He is not diaphoretic. There is erythema.   Malodorous yellow discharge noted behind base of right second toe, with ulceration and dried blood in surroundings. Non-tender. Pulses intact on b/l LE.    Psychiatric: He has a normal mood and affect. His behavior is normal. Thought content normal.         Assessment and orders:     Pt is 43yr M who presents to clinic for management of toe ulcer.    ICD-10-CM ICD-9-CM    1. Skin ulcer of right foot, limited to breakdown of skin (HCC) L97.511 707.15 ANAEROBIC/AEROBIC/GRAM STAIN      clindamycin (CLEOCIN) 300 mg capsule     Diagnoses and all orders for this visit:    1. Skin ulcer of right foot, limited to breakdown of skin ([facility]  Patient with recurrent right foot infection. S/p cleaning with saline. Will f/u on wound Cx result and initiate treatment with Cleocin. Also, advised patient to touch base with Podiatrist as soon as possible for further recommendations. Patient to follow-up in a week.   -     ANAEROBIC/AEROBIC/GRAM STAIN  -     clindamycin (CLEOCIN) 300 mg capsule; Take 1 Cap by mouth three (3) times daily for 10 days.      Follow-up and Dispositions    ?? Return in about 1 week (around 11/10/2017) for follow-up.         I have reviewed patient medical and social history and medications.  I have reviewed pertinent labs results and other data. I have discussed the diagnosis with the patient and the intended plan as seen in the above orders. The patient has received an after-visit summary and questions were answered concerning future plans. I have discussed medication side effects and warnings with the patient as well.    DPosey Rea MD  [facility] 11/03/17

## 2017-11-03 NOTE — Progress Notes (Signed)
See my separate note written on the same day.

## 2017-11-03 NOTE — Progress Notes (Signed)
I saw and evaluated the patient with the resident, performing the key elements of the exam and service.  I discussed the findings, assessment and plan with the resident and agree with the resident's findings and plan as documented in the resident's note.    He has a new toe ulcer underneath his right second toe.  It was cleaned in the office and cultured.  This is an extremely serious situation since he had osteomyelitis in his right foot last year.  He is predisposed to this because of his severe foot neuropathy.  [facility] [place] [facility] [place]  He used a power scooter so he did not have to walk that much but I wonder if the extra walking triggered this ulceration.  He was seen in urgent care center there but was not given an antibiotic.  He noticed when he came home that the toe was getting to smell and is gotten progressively worse.  Because of the malodorous wound we will start him on Cleocin for better anaerobic coverage.    I personally examined his toe.  The ulceration is in the skin crease.  It is full-thickness of the dermis but not deeper.  Because of his neuropathy he always has a dependent rubor in that foot.  Because of the neuropathy he cannot sense pain in that toe.  After the ulcer was cleansed he asked if I could cut his fungal infected toenails.  They were long and irregular and posed a risk to cut in adjacent toes.    Previous treatment difficulties with his osteomyelitis he has preferred to see a podiatrist in [place].  Because of his previous problems with his osteomyelitis he prefers to [place] podiatrist in [place].  Because of his previous complex osteomyelitisBecause of his previous complex osteomyelitis he prefers to [place]iat[place]. I called and asked him to make an urgent appointment. [place] one to look at his toe. He needs to be with his wife to help clean and observe his toe daily.      Isaac LaudSteven N. Mliss SaxSpence, M.D.

## 2017-11-03 NOTE — Progress Notes (Signed)
Chief Complaint   Patient presents with   ??? Foot Injury     Right Foot      Body mass index is 46.44 kg/m??.    1. Have you been to the ER, urgent care clinic since your last visit?  Hospitalized since your last visit?No    2. Have you seen or consulted any other health care providers outside of the [facility] since your last visit?  Include any pap smears or colon screening. No    Reviewed record in preparation for visit and have necessary documentation  Pt did not bring medication to office visit for review  Information was given to pt on Advanced Directives, Living Will  Information was given on Shingles Vaccine  Opportunity was given for questions  Goals that were addressed and/or need to be completed after this appointment include:     Health Maintenance Due   Topic Date Due   ??? Shingrix Vaccine Age 50> (1 of 2) 12/22/1998   ??? FOBT Q 1 YEAR AGE 50-75  12/22/1998   ??? Pneumococcal 65+ years (1 of 2 - PCV13) 12/21/2013   ??? MEDICARE YEARLY EXAM  07/30/2016   ??? GLAUCOMA SCREENING Q2Y  10/27/2016   ??? Influenza Age 9 to Adult  10/27/2017

## 2017-11-03 NOTE — Progress Notes (Signed)
See my separate note written on the same day.

## 2017-11-03 NOTE — Progress Notes (Signed)
[facility]    Subjective:   Elijah [name] is a 67 y.o. male with history of HTN, HLD, CAD, polyneuropathy, BPH, OSA on CPAP, osteomyelitis  CC: Right foot infection  History provided by patient    HPI:    Patient presents to clinic with complaint of possible toe infection. He states that in the last 1-2 weeks, he has noted "bad smell" coming from right foot. Yesterday, he noticed blood behind right toes. Since he has no sensation to foot, he decided to come to PCP for better evaluation. Patient has a history of osteomyelitis on same foot, so he is very concerned he might have another infection. No recent known trauma or falls. Denies fever, chills, night sweats, SOB, N/V, abdominal pain. No other complaint.    Current Outpatient Medications on File Prior to Visit   Medication Sig Dispense Refill   ??? amLODIPine (NORVASC) 5 mg tablet TAKE 1 TABLET EVERY DAY 90 Tab 0   ??? pregabalin (LYRICA) 300 mg capsule Take 1 Cap by mouth two (2) times a day. Max Daily Amount: 600 mg. 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 diclofenac EC (VOLTAREN) 50 mg EC tablet Take 1 Tab by mouth two (2) times daily as needed for Pain. 60 Tab 1   ??? cyanocobalamin (VITAMIN B12) 500 mcg tablet Take 1 Tab by mouth daily. 90 Tab 3   ??? cholecalciferol (VITAMIN D3) 5,000 unit tab tablet Take 1 Tab by mouth daily. 90 Tab 3   ??? lisinopril (PRINIVIL, ZESTRIL) 20 mg tablet TAKE 1 TABLET BY MOUTH EVERY DAY 90 Tab 1   ??? nebivolol (BYSTOLIC) 10 mg tablet TAKE 1 TABLET BY MOUTH EVERY DAY 90 Tab 1   ??? DULoxetine (CYMBALTA) 60 mg capsule Take 1 Cap by mouth daily. 90 Cap 4   ??? atorvastatin (LIPITOR) 40 mg tablet Take 80 mg by mouth nightly.     ??? co-enzyme Q-10 (CO Q-10) 100 mg capsule Take 100 mg by mouth nightly.     ??? Flaxseed Oil oil Take 1 Cap by mouth daily.     ??? cpap machine kit by Does Not Apply route.     ??? aspirin (ASPIRIN) 325 mg tablet Take 1 Tab by mouth daily. (Patient taking  differently: Take 81 mg by mouth daily.) 90 Tab 0   ??? DOCOSAHEXANOIC ACID/EPA (FISH OIL PO) Take 1 Cap by mouth daily.       No current facility-administered medications on file prior to visit.        Patient Active Problem List   Diagnosis Code   ??? Essential hypertension, benign I10   ??? HLD (hyperlipidemia) E78.5   ??? Coronary atherosclerosis of native coronary artery I25.10   ??? OSA on CPAP G47.33, Z99.89   ??? Morbid obesity due to excess calories (HCC) E66.01   ??? Tubular adenoma of colon D12.6   ??? Polyneuropathy G62.9   ??? Benign prostatic hyperplasia N40.0   ??? Pure hypercholesterolemia E78.00   ??? 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 Osteomyelitis ([place]) M86.9   ??? Subacute osteomyelitis of right foot ([place]) M86.271   ??? Chronic fatigue R53.82       Social History     Socioeconomic History   ??? Marital status: MARRIED     Spouse name: Not on file   ??? Number of children: Not on file   ??? Years of education: Not on file   ???  Highest education level: Not on file   Occupational History   ??? Not on file   Social Needs   ??? Financial resource strain: Not on file   ??? Food insecurity:     Worry: Not on file     Inability: Not on file   ??? Transportation needs:     Medical: Not on file     Non-medical: Not on file   Tobacco Use   ??? Smoking status: Former Smoker     Types: Cigarettes   ??? Smokeless tobacco: Never Used   Substance and Sexual Activity   ??? Alcohol use: No     Comment: recently has stopped   ??? Drug use: No   ??? Sexual activity: Not on file   Lifestyle   ??? Physical activity:     Days per week: Not on file     Minutes per session: Not on file   ??? Stress: Not on file   Relationships   ??? Social connections:     Talks on phone: Not on file     Gets together: Not on file     Attends religious service: Not on file     Active member of club or organization: Not on file     Attends meetings of clubs or organizations: Not on file     Relationship status: Not on file   ??? Intimate partner violence:     Fear of current or ex  partner: Not on file     Emotionally abused: Not on file     Physically abused: Not on file     Forced sexual activity: Not on file   Other Topics Concern   ??? Not on file   Social History Narrative   ??? Not on file       Review of Systems   Constitutional: Negative for chills and fever.   HENT: Negative for congestion.    Eyes: Negative for blurred vision.   Respiratory: Negative for shortness of breath.    Cardiovascular: Negative for chest pain and palpitations.   Gastrointestinal: Negative for abdominal pain, nausea and vomiting.   Genitourinary: Negative for dysuria.   Musculoskeletal: Negative for myalgias.   Skin: Negative for itching.        Malodorous discharge and bleeding on right foot   Neurological: Negative for dizziness and headaches.   Psychiatric/Behavioral: Negative for substance abuse.         Objective:     Visit Vitals  BP 146/83 (BP 1 Location: Left arm, BP Patient Position: Sitting)   Pulse (!) 56   Temp 98.3 ??F (36.8 ??C) (Oral)   Resp 20   Ht _0  (1.854 m)   Wt (!) 352 lb (159.7 kg)   SpO2 96%   BMI 46.44 kg/m??        Physical Exam   Constitutional: He is oriented to person, place, and time. He appears well-developed. No distress.   HENT:   Head: Normocephalic and atraumatic.   Eyes: Conjunctivae and EOM are normal.   Neck: Normal range of motion. Neck supple.   Cardiovascular: Normal rate, regular rhythm and normal heart sounds.   Pulmonary/Chest: Effort normal and breath sounds normal. No respiratory distress. He has no wheezes.   Abdominal: Soft. Bowel sounds are normal. He exhibits no distension. There is no tenderness.   Musculoskeletal: Normal range of motion. He exhibits no tenderness.   Neurological: He is alert and oriented to person, place, and time.  Skin: He is not diaphoretic. There is erythema.   Malodorous yellow discharge noted behind base of right second toe, with ulceration and dried blood in surroundings. Non-tender. Pulses intact on b/l LE.   Psychiatric: He has a normal  mood and affect. His behavior is normal. Thought content normal.         Assessment and orders:     Pt is 36yr M who presents to clinic for management of toe ulcer.    ICD-10-CM ICD-9-CM    1. Skin ulcer of right foot, limited to breakdown of skin (HCC) L97.511 707.15 ANAEROBIC/AEROBIC/GRAM STAIN      clindamycin (CLEOCIN) 300 mg capsule     Diagnoses and all orders for this visit:    1. Skin ulcer of right foot, limited to breakdown of skin ([facility]  Patient with recurrent right foot infection. S/p cleaning with saline. Will f/u on wound Cx result and initiate treatment with Cleocin. Also, advised patient to touch base with Podiatrist as soon as possible for further recommendations. Patient to follow-up in a week.   -     ANAEROBIC/AEROBIC/GRAM STAIN  -     clindamycin (CLEOCIN) 300 mg capsule; Take 1 Cap by mouth three (3) times daily for 10 days.      Follow-up and Dispositions    ?? Return in about 1 week (around 11/10/2017) for follow-up.         I have reviewed patient medical and social history and medications.  I have reviewed pertinent labs results and other data. I have discussed the diagnosis with the patient and the intended plan as seen in the above orders. The patient has received an after-visit summary and questions were answered concerning future plans. I have discussed medication side effects and warnings with the patient as well.    DPosey Rea MD  [facility] 11/03/17

## 2017-11-03 NOTE — Progress Notes (Signed)
I saw and evaluated the patient with the resident, performing the key elements of the exam and service.  I discussed the findings, assessment and plan with the resident and agree with the resident's findings and plan as documented in the resident's note.    He has a new toe ulcer underneath his right second toe.  It was cleaned in the office and cultured.  This is an extremely serious situation since he had osteomyelitis in his right foot last year.  He is predisposed to this because of his severe foot neuropathy.  [facility] [place] [facility] [place]  He used a power scooter so he did not have to walk that much but I wonder if the extra walking triggered this ulceration.  He was seen in urgent care center there but was not given an antibiotic.  He noticed when he came home that the toe was getting to smell and is gotten progressively worse.  Because of the malodorous wound we will start him on Cleocin for better anaerobic coverage.    I personally examined his toe.  The ulceration is in the skin crease.  It is full-thickness of the dermis but not deeper.  Because of his neuropathy he always has a dependent rubor in that foot.  Because of the neuropathy he cannot sense pain in that toe.  After the ulcer was cleansed he asked if I could cut his fungal infected toenails.  They were long and irregular and posed a risk to cut in adjacent toes.    Previous treatment difficulties with his osteomyelitis he has preferred to see a podiatrist in [place].  Because of his previous problems with his osteomyelitis he prefers to [place] podiatrist in [place].  Because of his previous complex osteomyelitisBecause of his previous complex osteomyelitis he prefers to [place]iat[place]. I called and asked him to make an urgent appointment. [place] and has no one to look at his toe. He needs to be with his wife to help clean and observe his toe daily.     Isaac LaudSteven N.  Mliss SaxSpence, M.D.

## 2017-11-03 NOTE — Progress Notes (Signed)
 Chief Complaint   Patient presents with   . Foot Injury     Right Foot      Body mass index is 46.44 kg/m.    1. Have you been to the ER, urgent care clinic since your last visit?  Hospitalized since your last visit?No    2. Have you seen or consulted any other health care providers outside of the [facility] since your last visit?  Include any pap smears or colon screening. No    Reviewed record in preparation for visit and have necessary documentation  Pt did not bring medication to office visit for review  Information was given to pt on Advanced Directives, Living Will  Information was given on Shingles Vaccine  Opportunity was given for questions  Goals that were addressed and/or need to be completed after this appointment include:     Health Maintenance Due   Topic Date Due   . Shingrix Vaccine Age 75> (1 of 2) 12/22/1998   . FOBT Q 1 YEAR AGE 55-75  12/22/1998   . Pneumococcal 65+ years (1 of 2 - PCV13) 12/21/2013   . MEDICARE YEARLY EXAM  07/30/2016   . GLAUCOMA SCREENING Q2Y  10/27/2016   . Influenza Age 60 to Adult  10/27/2017

## 2017-11-07 NOTE — Progress Notes (Signed)
The patient has seen his physicians in [place].  He said the toe looks better and he says the odor has gone.  The preliminary culture results are gram-negative rods.  No sensitivities yet.  He has a special drying paste that he is applying to the wound.  He is keeping it dry and clean with gauze.  He has a follow-up with the podiatrist in 1 to 2 weeks.  Thank you,  Dr. Samra Pesch

## 2017-11-07 NOTE — Progress Notes (Signed)
The patient has seen his physicians in [place].  He said the toe looks better and he says the odor has gone.  The preliminary culture results are gram-negative rods.  No sensitivities yet.  He has a special drying paste that he is applying to the wound.  He is keeping it dry and clean with gauze.  He has a follow-up with the podiatrist in 1 to 2 weeks.  Thank you,  Dr. Mliss SaxSpence

## 2017-11-08 LAB — ANAEROBIC/AEROBIC/GRAM STAIN
Anerobic culture: NO GROWTH
Gram Stain Result: NONE SEEN

## 2017-11-10 MED ORDER — AMLODIPINE 5 MG TAB
5 mg | ORAL_TABLET | ORAL | 2 refills | Status: DC
Start: 2017-11-10 — End: 2018-03-17

## 2017-11-18 ENCOUNTER — Ambulatory Visit: Attending: Family Medicine | Primary: Family Medicine

## 2017-11-18 ENCOUNTER — Ambulatory Visit: Admit: 2017-11-18 | Discharge: 2017-11-18 | Payer: MEDICARE | Attending: Family Medicine | Primary: Family Medicine

## 2017-11-18 DIAGNOSIS — Z23 Encounter for immunization: Secondary | ICD-10-CM

## 2017-11-18 MED ORDER — VARICELLA-ZOSTER VACCINE (LYOPHILIZED GE COMPONENT)
50 mcg | INTRAMUSCULAR | 0 refills | Status: DC
Start: 2017-11-18 — End: 2017-12-28

## 2017-11-18 NOTE — Progress Notes (Addendum)
[facility]  [address]  [place], [place]  [phone]             Date of visit: 11/18/2017   [name], MD    This is an Initial Medicare Preventive Physical Exam (IPPE), "Welcome to Medicare" (Performed within 12 months of effective date of Medicare Part B enrollment, Once in a lifetime, not to be done if patient already had a Medicare Annual Wellness Visit)    I have reviewed the patient's medical history in detail and updated the computerized patient record.     History obtained from: the patient.    Concerns today   (Patient understands that medical problems addressed today may incur additional cost as this is a preventive visit)      History     Patient Active Problem List   Diagnosis Code   ??? Essential hypertension, benign I10   ??? HLD (hyperlipidemia) E78.5   ??? Coronary atherosclerosis of native coronary artery I25.10   ??? OSA on CPAP G47.33, Z99.89   ??? Morbid obesity due to excess calories (HCC) E66.01   ??? Tubular adenoma of colon D12.6   ??? Polyneuropathy G62.9   ??? Benign prostatic hyperplasia N40.0   ??? Pure hypercholesterolemia E78.00   ??? 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 Osteomyelitis ([place]) M86.9   ??? 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 Chronic fatigue R53.82     Past Medical History:   Diagnosis Date   ??? BPH (benign prostatic hyperplasia)    ??? CAD (coronary artery disease)    ??? History of vascular access device 09/08/2016    [facility] VAT, 5 FR double, R brachial, 48 cm with 1 cm out, LTA   ??? HLD (hyperlipidemia)    ??? HTN (hypertension)    ??? Morbid obesity due to excess calories ([place]) 04/24/2015   ??? OSA (obstructive sleep apnea)    ??? Polyneuropathy    ??? Tubular adenoma of colon     x2 on colonoscopy 09/18/15      Past Surgical History:   Procedure Laterality Date   ??? HX HIP REPLACEMENT Bilateral      Allergies   Allergen Reactions   ??? Metoprolol Other (comments)     Patient states, made me feel horrible.      Current Outpatient Medications   Medication Sig Dispense Refill   ??? amLODIPine (NORVASC) 5 mg tablet TAKE ONE TABLET BY MOUTH EVERY DAY 90 Tab 2   ??? pregabalin (LYRICA) 300 mg capsule Take 1 Cap by mouth two (2) times a day. Max Daily Amount: 600 mg. 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 diclofenac EC (VOLTAREN) 50 mg EC tablet Take 1 Tab by mouth two (2) times daily as needed for Pain. 60 Tab 1   ??? cyanocobalamin (VITAMIN B12) 500 mcg tablet Take 1 Tab by mouth daily. 90 Tab 3   ??? cholecalciferol (VITAMIN D3) 5,000 unit tab tablet Take 1 Tab by mouth daily. 90 Tab 3   ??? lisinopril (PRINIVIL, ZESTRIL) 20 mg tablet TAKE 1 TABLET BY MOUTH EVERY DAY 90 Tab 1   ??? nebivolol (BYSTOLIC) 10 mg tablet TAKE 1 TABLET BY MOUTH EVERY DAY 90 Tab 1   ??? DULoxetine (CYMBALTA) 60 mg capsule Take 1 Cap by mouth daily. 90 Cap 4   ??? atorvastatin (LIPITOR) 40 mg tablet Take 80 mg by mouth nightly.     ???  co-enzyme Q-10 (CO Q-10) 100 mg capsule Take 100 mg by mouth nightly.     ??? Flaxseed Oil oil Take 1 Cap by mouth daily.     ??? cpap machine kit by Does Not Apply route.     ??? aspirin (ASPIRIN) 325 mg tablet Take 1 Tab by mouth daily. (Patient taking differently: Take 81 mg by mouth daily.) 90 Tab 0   ??? DOCOSAHEXANOIC ACID/EPA (FISH OIL PO) Take 1 Cap by mouth daily.       Family History   Problem Relation Age of Onset   ??? Stroke Mother    ??? Other Father         mrsa infxn caused death   ??? Heart Attack Brother      Social History     Tobacco Use   ??? Smoking status: Former Smoker     Types: Cigarettes   ??? Smokeless tobacco: Never Used   Substance Use Topics   ??? Alcohol use: No     Comment: recently has stopped       Specialists/Care Team   [name] has established care with the following healthcare providers:      Depression Risk Factor Screening:   -Over the past 2 weeks, have you felt down, depressed or hopeless? no   -Over the past 2 weeks, have you felt little interest or pleasure in doing things? no      Lifestyle and Health Habits:   Diet: Self Grade C  Physical activity: limited due to joint issues     Functional Ability and Level of Safety:   Alert and active.      Hearing Loss   Have you noticed any hearing difficulties? yes    Activities of Daily Living   Do you need help with the phone, transportation, shopping, preparing meals, housework, laundry, medications or managing money? no  Requires assistance with: no ADLs    Fall Risk and Home Safety   Was the patient's timed Up & Go test unsteady or longer than 10 seconds? no  Does your home have rugs in the hallway, lack grab bars in the bathroom, lack handrails on the stairs or have poor lighting? no  Do you have smoke detectors and check them regularly? yes    Alcohol Risk Factor Screening:   On any occasion during the past 3 months, have you had more than 4 drinks containing alcohol? No  Do you average more than 14 drinks per week? No  ??  Abuse Screen   Patient is not abused    Evaluation of Cognitive Function   Mood/affect:  happy  Orientation: Person, Place, Time and Situation  Appearance: age appropriate and casually dressed  Family member/caregiver input: no    Review of Systems (if indicated for problems addressed today)   Review of Systems   Constitutional: Negative.  Negative for chills, fever, malaise/fatigue and weight loss.   HENT: Negative.  Negative for hearing loss.    Eyes: Negative.  Negative for blurred vision and double vision.   Respiratory: Negative.  Negative for cough, sputum production and shortness of breath.    Cardiovascular: Negative.  Negative for chest pain and palpitations.        2 days ago he had a stress test at the [facility].  He goes for the results today.   Gastrointestinal: Negative.  Negative for abdominal pain, blood in stool, heartburn, nausea and vomiting.   Genitourinary: Negative.  Negative for dysuria, frequency and urgency.  Musculoskeletal: Positive for joint pain. Negative for back pain, falls, myalgias and neck pain.        He got excellent relief from the right knee injection.  He returns to orthopedics next week.   Skin: Positive for rash.        No cellulitis in between toes has resolved.  He still however has cramps in the skin between the first and second toe and underneath the second toe.  He is putting an antibiotic powder on these to dry mouth and keep him from getting reinfected.  He has an appointment to see his podiatrist in a week or so.   Neurological: Negative.  Negative for dizziness, tingling, tremors, weakness and headaches.   Endo/Heme/Allergies: Negative.    Psychiatric/Behavioral: Negative.  Negative for depression.          Physical Examination     Wt Readings from Last 3 Encounters:   11/18/17 348 lb (157.9 kg)   11/03/17 (!) 352 lb (159.7 kg)   09/26/17 343 lb (155.6 kg)     Temp Readings from Last 3 Encounters:   11/18/17 98.6 ??F (37 ??C) (Oral)   11/03/17 98.3 ??F (36.8 ??C) (Oral)   09/26/17 97.6 ??F (36.4 ??C) (Oral)     BP Readings from Last 3 Encounters:   11/18/17 131/81   11/03/17 146/83   09/26/17 107/67     Pulse Readings from Last 3 Encounters:   11/18/17 60   11/03/17 (!) 56   09/26/17 64       Body mass index is 45.91 kg/m??.   No exam data present  Additional exam if indicated for problems addressed today:  Physical Exam   Constitutional: He is oriented to person, place, and time. He appears well-developed and well-nourished. No distress.   HENT:   Head: Normocephalic and atraumatic.   Mouth/Throat: Oropharynx is clear and moist.   Eyes: Conjunctivae are normal. Right eye exhibits no discharge. Left eye exhibits no discharge. No scleral icterus.   Neck: No thyromegaly present.   No carotid bruit.   Cardiovascular: Normal rate, regular rhythm and normal heart sounds. Exam reveals no gallop and no friction rub.   No murmur heard.  Pulmonary/Chest: Effort normal and breath sounds normal. No respiratory  distress. He has no wheezes. He has no rales. He exhibits no tenderness.   Abdominal: Soft. Bowel sounds are normal. He exhibits no distension and no mass. There is no tenderness. There is no guarding.   Musculoskeletal: He exhibits no edema or tenderness.   Lymphadenopathy:     He has no cervical adenopathy.   Neurological: He is alert and oriented to person, place, and time. No cranial nerve deficit.   Skin: Skin is warm and dry. No rash noted. He is not diaphoretic. No erythema. No pallor.   Psychiatric: He has a normal mood and affect. His behavior is normal.       Screening ECG Result (optional)   [facility] [phone] for routine ECG with 12 leads with interpretation and report)      Advice/Referrals/Counseling (as indicated)   Education and counseling provided for any problems identified above:   Counseled about tobacco cessation:   Counseled about alcohol misuse:   Counseled about prevention of sexually transmitted infections:     Preventive Services     (Checklist to be included in patient instructions)  Discussed today Done Previously Not Needed    x-need records   Pneumococcal vaccines   x-done today   Flu vaccine  x Hepatitis B vaccine (if at risk)   x-needs Shingrix   Shingles vaccine    x  TDAP vaccine    x  Colorectal cancer screening     x Low-dose CT for lung cancer screening     x Bone density test    x  Glaucoma screening    x  Cholesterol test    x  Diabetes screening test      x AAA ultrasound (if family history)     x Diabetes self-management class     x Nutritionist referral for diabetes or renal disease     Discussion of Advance Directive     Advance Care Planning 09/14/2016   Patient's Hope is: -   Confirm Advance Directive Yes, not on file           Assessment/Plan   V70.0    ICD-10-CM ICD-9-CM    1.    2. Encounter for immunization    Medicare wellness Z23 V03.89 ADMIN INFLUENZA VIRUS VAC      INFLUENZA VACCINE INACTIVATED (IIV), SUBUNIT, ADJUVANTED, IM     He received a flu shot while here today.  He was given a prescription for the Shingrix vaccine.  He will try to acquire the immunization record from the [place] [facility] for [place].  He has had VA care in [place] as well as in [place].       Orders Placed This Encounter   ??? INFLUENZA VACCINE INACTIVATED (IIV), SUBUNIT, ADJUVANTED, IM   ??? ADMIN INFLUENZA VIRUS VAC       Patient Care Team:  [name], MD as PCP - General (Family Practice)  Cammy Copa, MD (Cardiology)  Verdene Lennert, MD (Orthopedic Surgery)  Beola Cord., MD (Orthopedic Surgery)  [name], Patrick E, DPM (Podiatry)  Valetta Close, DO as Hospitalist (Infectious Diseases)  Rinaldo Cloud, MD (Orthopedic Surgery)    See me in 2 months    [name], MD    Addendum.  Mr. Tobe Sos call back to say that he had an abnormal stress test at the [facility].  He was told he would need a stent.  He would like to get this done at [place].  I will make the referral.

## 2017-11-18 NOTE — Patient Instructions (Signed)
Influenza (Flu) Vaccine (Inactivated or Recombinant): What You Need to Know  Why get vaccinated?  Influenza ("flu") is a contagious disease that spreads around the [place] every winter, usually between October and May.  Flu is caused by influenza viruses and is spread mainly by coughing, sneezing, and close contact.  Anyone can get flu. Flu strikes suddenly and can last several days. Symptoms vary by age, but can include:  ?? Fever/chills.  ?? Sore throat.  ?? Muscle aches.  ?? Fatigue.  ?? Cough.  ?? Headache.  ?? Runny or stuffy nose.  Flu can also lead to pneumonia and blood infections, and cause diarrhea and seizures in children. If you have a medical condition, such as heart or lung disease, flu can make it worse.  Flu is more dangerous for some people. Infants and young children, people 65 years of age and older, pregnant women, and people with certain health conditions or a weakened immune system are at greatest risk.  Each year thousands of people in the [place] die from flu, and many more are hospitalized.  Flu vaccine can:  ?? Keep you from getting flu.  ?? Make flu less severe if you do get it.  ?? Keep you from spreading flu to your family and other people.  Inactivated and recombinant flu vaccines  A dose of flu vaccine is recommended every flu season. Children 6 months through 8 years of age may need two doses during the same flu season. Everyone else needs only one dose each flu season.  Some inactivated flu vaccines contain a very small amount of a mercury-based preservative called thimerosal. Studies have not shown thimerosal in vaccines to be harmful, but flu vaccines that do not contain thimerosal are available.  There is no live flu virus in flu shots. They cannot cause the flu.  There are many flu viruses, and they are always changing. Each year a new flu vaccine is made to protect against three or four viruses that are likely to cause disease in the upcoming flu season. But even when the  vaccine doesn't exactly match these viruses, it may still provide some protection.  Flu vaccine cannot prevent:  ?? Flu that is caused by a virus not covered by the vaccine.  ?? Illnesses that look like flu but are not.  Some people should not get this vaccine  Tell the person who is giving you the vaccine:  ?? If you have any severe (life-threatening) allergies. If you ever had a life-threatening allergic reaction after a dose of flu vaccine, or have a severe allergy to any part of this vaccine, you may be advised not to get vaccinated. Most, but not all, types of flu vaccine contain a small amount of egg protein.  ?? If you ever had Guillain-Barr?? syndrome (also called GBS) Some people with a history of GBS should not get this vaccine. This should be discussed with your doctor.  ?? If you are not feeling well. It is usually okay to get flu vaccine when you have a mild illness, but you might be asked to come back when you feel better.  Risks of a vaccine reaction  With any medicine, including vaccines, there is a chance of reactions. These are usually mild and go away on their own, but serious reactions are also possible.  Most people who get a flu shot do not have any problems with it.  Minor problems following a flu shot include:  ?? Soreness, redness, or swelling   where the shot was given  ?? Hoarseness  ?? Sore, red or itchy eyes  ?? Cough  ?? Fever  ?? Aches  ?? Headache  ?? Itching  ?? Fatigue  If these problems occur, they usually begin soon after the shot and last 1 or 2 days.  More serious problems following a flu shot can include the following:  ?? There may be a small increased risk of Guillain-Barr?? Syndrome (GBS) after inactivated flu vaccine. This risk has been estimated at 1 or 2 additional cases per million people vaccinated. This is much lower than the risk of severe complications from flu, which can be prevented by flu vaccine.  ?? Young children who get the flu shot along with pneumococcal vaccine  (PCV13) and/or DTaP vaccine at the same time might be slightly more likely to have a seizure caused by fever. Ask your doctor for more information. Tell your doctor if a child who is getting flu vaccine has ever had a seizure  Problems that could happen after any injected vaccine:  ?? People sometimes faint after a medical procedure, including vaccination. Sitting or lying down for about 15 minutes can help prevent fainting, and injuries caused by a fall. Tell your doctor if you feel dizzy, or have vision changes or ringing in the ears.  ?? Some people get severe pain in the shoulder and have difficulty moving the arm where a shot was given. This happens very rarely.  ?? Any medication can cause a severe allergic reaction. Such reactions from a vaccine are very rare, estimated at about 1 in a million doses, and would happen within a few minutes to a few hours after the vaccination.  As with any medicine, there is a very remote chance of a vaccine causing a serious injury or death.  The safety of vaccines is always being monitored. For more information, visit: www.cdc.gov/vaccinesafety/.  What if there is a serious reaction?  What should I look for?  ?? Look for anything that concerns you, such as signs of a severe allergic reaction, very high fever, or unusual behavior.  Signs of a severe allergic reaction can include hives, swelling of the face and throat, difficulty breathing, a fast heartbeat, dizziness, and weakness ??? usually within a few minutes to a few hours after the vaccination.  What should I do?  ?? If you think it is a severe allergic reaction or other emergency that can't wait, call 9-1-1 and get the person to the nearest hospital. Otherwise, call your doctor.  ?? Reactions should be reported to the "Vaccine Adverse Event Reporting System" (VAERS). Your doctor should file this report, or you can do it yourself through the VAERS website at www.vaers.hhs.gov, or by calling 1-800-822-7967.   VAERS does not give medical advice.  The National Vaccine Injury Compensation Program  The National Vaccine Injury Compensation Program (VICP) is a federal program that was created to compensate people who may have been injured by certain vaccines.  Persons who believe they may have been injured by a vaccine can learn about the program and about filing a claim by calling 1-800-338-2382 or visiting the VICP website at www.hrsa.gov/vaccinecompensation. There is a time limit to file a claim for compensation.  How can I learn more?  ?? Ask your healthcare provider. He or she can give you the vaccine package insert or suggest other sources of information.  ?? Call your local or state health department.  ?? Contact the Centers for Disease Control and Prevention (CDC):  ?   Call 1-800-232-4636 (1-800-CDC-INFO) or  ? Visit CDC's website at www.cdc.gov/flu  Vaccine Information Statement  Inactivated Influenza Vaccine  11/02/2013)  42 U.S.C. ?? 300aa-26  Department of Health and Human Services  Centers for Disease Control and Prevention  Many Vaccine Information Statements are available in Spanish and other languages. See www.immunize.org/vis.  Muchas hojas de informaci??n sobre vacunas est??n disponibles en espa??ol y en otros idiomas. Visite www.immunize.org/vis.  Care instructions adapted under license by [employer] (which disclaims liability or warranty for this information). If you have questions about a medical condition or this instruction, always ask your healthcare professional. [employer] disclaims any warranty or liability for your use of this information.

## 2017-11-18 NOTE — Addendum Note (Signed)
Addended by: Jacelyn GripSPENCE, Samantha Olivera N on: 11/18/2017 04:54 PM     Modules accepted: Orders

## 2017-11-18 NOTE — Progress Notes (Signed)
1. Have you been to the ER, urgent care clinic since your last visit?  Hospitalized since your last visit?No    2. Have you seen or consulted any other health care providers outside of the [facility] since your last visit?  Include any pap smears or colon screening. No  Reviewed record in preparation for visit and have necessary documentation  Pt did not bring medication to office visit for review  Information was given to pt on Advanced Directives, Living Will  Information was given on Shingles Vaccine  opportunity was given for questions  Goals that were addressed and/or need to be completed during or after this appointment include     Health Maintenance Due   Topic Date Due   ??? Shingrix Vaccine Age 50> (1 of 2) 12/22/1998   ??? FOBT Q 1 YEAR AGE 50-75  12/22/1998   ??? Pneumococcal 65+ years (1 of 2 - PCV13) 12/21/2013   ??? MEDICARE YEARLY EXAM  07/30/2016   ??? GLAUCOMA SCREENING Q2Y  10/27/2016   ??? Influenza Age 9 to Adult  10/27/2017

## 2017-11-18 NOTE — Addendum Note (Signed)
Addendum Note by Jacelyn GripSpence, Hooria Gasparini N, MD at 11/18/17 1130                Author: Jacelyn GripSpence, Raequan Vanschaick N, MD  Service: --  Author Type: Physician       Filed: 11/18/17 1654  Encounter Date: 11/18/2017  Status: Signed          Editor: Jacelyn GripSpence, Jeren Dufrane N, MD (Physician)          Addended by: Jacelyn GripSPENCE, Amelia Burgard N on: 11/18/2017 04:54 PM    Modules accepted: Orders

## 2017-11-18 NOTE — Progress Notes (Signed)
 1. Have you been to the ER, urgent care clinic since your last visit?  Hospitalized since your last visit?No    2. Have you seen or consulted any other health care providers outside of the [facility] since your last visit?  Include any pap smears or colon screening. No  Reviewed record in preparation for visit and have necessary documentation  Pt did not bring medication to office visit for review  Information was given to pt on Advanced Directives, Living Will  Information was given on Shingles Vaccine  opportunity was given for questions  Goals that were addressed and/or need to be completed during or after this appointment include     Health Maintenance Due   Topic Date Due   . Shingrix Vaccine Age 25> (1 of 2) 12/22/1998   . FOBT Q 1 YEAR AGE 92-75  12/22/1998   . Pneumococcal 65+ years (1 of 2 - PCV13) 12/21/2013   . MEDICARE YEARLY EXAM  07/30/2016   . GLAUCOMA SCREENING Q2Y  10/27/2016   . Influenza Age 69 to Adult  10/27/2017

## 2017-11-18 NOTE — Progress Notes (Signed)
[facility]  [address]  [place], [place]  [phone]             Date of visit: 11/18/2017   [name], MD    This is an Initial Medicare Preventive Physical Exam (IPPE), "Welcome to Medicare" (Performed within 12 months of effective date of Medicare Part B enrollment, Once in a lifetime, not to be done if patient already had a Medicare Annual Wellness Visit)    I have reviewed the patient's medical history in detail and updated the computerized patient record.     History obtained from: the patient.    Concerns today   (Patient understands that medical problems addressed today may incur additional cost as this is a preventive visit)      History     Patient Active Problem List   Diagnosis Code   ??? Essential hypertension, benign I10   ??? HLD (hyperlipidemia) E78.5   ??? Coronary atherosclerosis of native coronary artery I25.10   ??? OSA on CPAP G47.33, Z99.89   ??? Morbid obesity due to excess calories (HCC) E66.01   ??? Tubular adenoma of colon D12.6   ??? Polyneuropathy G62.9   ??? Benign prostatic hyperplasia N40.0   ??? Pure hypercholesterolemia E78.00   ??? 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 Osteomyelitis ([place]) M86.9   ??? 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 Chronic fatigue R53.82     Past Medical History:   Diagnosis Date   ??? BPH (benign prostatic hyperplasia)    ??? CAD (coronary artery disease)    ??? History of vascular access device 09/08/2016    [facility] VAT, 5 FR double, R brachial, 48 cm with 1 cm out, LTA   ??? HLD (hyperlipidemia)    ??? HTN (hypertension)    ??? Morbid obesity due to excess calories ([place]) 04/24/2015   ??? OSA (obstructive sleep apnea)    ??? Polyneuropathy    ??? Tubular adenoma of colon     x2 on colonoscopy 09/18/15      Past Surgical History:   Procedure Laterality Date   ??? HX HIP REPLACEMENT Bilateral      Allergies   Allergen Reactions   ??? Metoprolol Other (comments)     Patient states, made me feel horrible.     Current  Outpatient Medications   Medication Sig Dispense Refill   ??? amLODIPine (NORVASC) 5 mg tablet TAKE ONE TABLET BY MOUTH EVERY DAY 90 Tab 2   ??? pregabalin (LYRICA) 300 mg capsule Take 1 Cap by mouth two (2) times a day. Max Daily Amount: 600 mg. 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 diclofenac EC (VOLTAREN) 50 mg EC tablet Take 1 Tab by mouth two (2) times daily as needed for Pain. 60 Tab 1   ??? cyanocobalamin (VITAMIN B12) 500 mcg tablet Take 1 Tab by mouth daily. 90 Tab 3   ??? cholecalciferol (VITAMIN D3) 5,000 unit tab tablet Take 1 Tab by mouth daily. 90 Tab 3   ??? lisinopril (PRINIVIL, ZESTRIL) 20 mg tablet TAKE 1 TABLET BY MOUTH EVERY DAY 90 Tab 1   ??? nebivolol (BYSTOLIC) 10 mg tablet TAKE 1 TABLET BY MOUTH EVERY DAY 90 Tab 1   ??? DULoxetine (CYMBALTA) 60 mg capsule Take 1 Cap by mouth daily. 90 Cap 4   ??? atorvastatin (LIPITOR) 40 mg tablet Take 80 mg by mouth nightly.     ???  co-enzyme Q-10 (CO Q-10) 100 mg capsule Take 100 mg by mouth nightly.     ??? Flaxseed Oil oil Take 1 Cap by mouth daily.     ??? cpap machine kit by Does Not Apply route.     ??? aspirin (ASPIRIN) 325 mg tablet Take 1 Tab by mouth daily. (Patient taking differently: Take 81 mg by mouth daily.) 90 Tab 0   ??? DOCOSAHEXANOIC ACID/EPA (FISH OIL PO) Take 1 Cap by mouth daily.       Family History   Problem Relation Age of Onset   ??? Stroke Mother    ??? Other Father         mrsa infxn caused death   ??? Heart Attack Brother      Social History     Tobacco Use   ??? Smoking status: Former Smoker     Types: Cigarettes   ??? Smokeless tobacco: Never Used   Substance Use Topics   ??? Alcohol use: No     Comment: recently has stopped       Specialists/Care Team   [name] has established care with the following healthcare providers:      Depression Risk Factor Screening:   -Over the past 2 weeks, have you felt down, depressed or hopeless? no  -Over the past 2 weeks, have you felt little interest or pleasure in doing  things? no      Lifestyle and Health Habits:   Diet: Self Grade C  Physical activity: limited due to joint issues     Functional Ability and Level of Safety:   Alert and active.      Hearing Loss   Have you noticed any hearing difficulties? yes    Activities of Daily Living   Do you need help with the phone, transportation, shopping, preparing meals, housework, laundry, medications or managing money? no  Requires assistance with: no ADLs    Fall Risk and Home Safety   Was the patient's timed Up & Go test unsteady or longer than 10 seconds? no  Does your home have rugs in the hallway, lack grab bars in the bathroom, lack handrails on the stairs or have poor lighting? no  Do you have smoke detectors and check them regularly? yes    Alcohol Risk Factor Screening:   On any occasion during the past 3 months, have you had more than 4 drinks containing alcohol? No  Do you average more than 14 drinks per week? No  ??  Abuse Screen   Patient is not abused    Evaluation of Cognitive Function   Mood/affect:  happy  Orientation: Person, Place, Time and Situation  Appearance: age appropriate and casually dressed  Family member/caregiver input: no    Review of Systems (if indicated for problems addressed today)   Review of Systems   Constitutional: Negative.  Negative for chills, fever, malaise/fatigue and weight loss.   HENT: Negative.  Negative for hearing loss.    Eyes: Negative.  Negative for blurred vision and double vision.   Respiratory: Negative.  Negative for cough, sputum production and shortness of breath.    Cardiovascular: Negative.  Negative for chest pain and palpitations.        2 days ago he had a stress test at the [facility].  He goes for the results today.   Gastrointestinal: Negative.  Negative for abdominal pain, blood in stool, heartburn, nausea and vomiting.   Genitourinary: Negative.  Negative for dysuria, frequency and urgency.  Musculoskeletal: Positive for joint pain. Negative for back pain, falls,  myalgias and neck pain.        He got excellent relief from the right knee injection.  He returns to orthopedics next week.   Skin: Positive for rash.        No cellulitis in between toes has resolved.  He still however has cramps in the skin between the first and second toe and underneath the second toe.  He is putting an antibiotic powder on these to dry mouth and keep him from getting reinfected.  He has an appointment to see his podiatrist in a week or so.   Neurological: Negative.  Negative for dizziness, tingling, tremors, weakness and headaches.   Endo/Heme/Allergies: Negative.    Psychiatric/Behavioral: Negative.  Negative for depression.          Physical Examination     Wt Readings from Last 3 Encounters:   11/18/17 348 lb (157.9 kg)   11/03/17 (!) 352 lb (159.7 kg)   09/26/17 343 lb (155.6 kg)     Temp Readings from Last 3 Encounters:   11/18/17 98.6 ??F (37 ??C) (Oral)   11/03/17 98.3 ??F (36.8 ??C) (Oral)   09/26/17 97.6 ??F (36.4 ??C) (Oral)     BP Readings from Last 3 Encounters:   11/18/17 131/81   11/03/17 146/83   09/26/17 107/67     Pulse Readings from Last 3 Encounters:   11/18/17 60   11/03/17 (!) 56   09/26/17 64       Body mass index is 45.91 kg/m??.   No exam data present  Additional exam if indicated for problems addressed today:  Physical Exam   Constitutional: He is oriented to person, place, and time. He appears well-developed and well-nourished. No distress.   HENT:   Head: Normocephalic and atraumatic.   Mouth/Throat: Oropharynx is clear and moist.   Eyes: Conjunctivae are normal. Right eye exhibits no discharge. Left eye exhibits no discharge. No scleral icterus.   Neck: No thyromegaly present.   No carotid bruit.   Cardiovascular: Normal rate, regular rhythm and normal heart sounds. Exam reveals no gallop and no friction rub.   No murmur heard.  Pulmonary/Chest: Effort normal and breath sounds normal. No respiratory distress. He has no wheezes. He has no rales. He exhibits no tenderness.    Abdominal: Soft. Bowel sounds are normal. He exhibits no distension and no mass. There is no tenderness. There is no guarding.   Musculoskeletal: He exhibits no edema or tenderness.   Lymphadenopathy:     He has no cervical adenopathy.   Neurological: He is alert and oriented to person, place, and time. No cranial nerve deficit.   Skin: Skin is warm and dry. No rash noted. He is not diaphoretic. No erythema. No pallor.   Psychiatric: He has a normal mood and affect. His behavior is normal.       Screening ECG Result (optional)   [facility] [phone] for routine ECG with 12 leads with interpretation and report)      Advice/Referrals/Counseling (as indicated)   Education and counseling provided for any problems identified above:   Counseled about tobacco cessation:   Counseled about alcohol misuse:   Counseled about prevention of sexually transmitted infections:     Preventive Services     (Checklist to be included in patient instructions)  Discussed today Done Previously Not Needed    x-need records   Pneumococcal vaccines   x-done today   Flu vaccine  x Hepatitis B vaccine (if at risk)   x-needs Shingrix   Shingles vaccine    x  TDAP vaccine    x  Colorectal cancer screening     x Low-dose CT for lung cancer screening     x Bone density test    x  Glaucoma screening    x  Cholesterol test    x  Diabetes screening test      x AAA ultrasound (if family history)     x Diabetes self-management class     x Nutritionist referral for diabetes or renal disease     Discussion of Advance Directive     Advance Care Planning 09/14/2016   Patient's [place] is: -   Confirm Advance Directive Yes, not on file           Assessment/Plan   V70.0    ICD-10-CM ICD-9-CM    1.    2. Encounter for immunization    Medicare wellness Z23 V03.89 ADMIN INFLUENZA VIRUS VAC      INFLUENZA VACCINE INACTIVATED (IIV), SUBUNIT, ADJUVANTED, IM    He received a flu shot while here today.  He was given a prescription for the Shingrix  vaccine.  He will try to acquire the immunization record from the [place] [facility] for [place].  He has had VA care in [place] as well as in [place].       Orders Placed This Encounter   ??? INFLUENZA VACCINE INACTIVATED (IIV), SUBUNIT, ADJUVANTED, IM   ??? ADMIN INFLUENZA VIRUS VAC       Patient Care Team:  [name], MD as PCP - General (Family Practice)  Cammy Copa, MD (Cardiology)  Verdene Lennert, MD (Orthopedic Surgery)  Beola Cord., MD (Orthopedic Surgery)  [name], Patrick E, DPM (Podiatry)  Valetta Close, DO as Hospitalist (Infectious Diseases)  Rinaldo Cloud, MD (Orthopedic Surgery)    See me in 2 months    [name], MD    Addendum.  Mr. Tobe Sos call back to say that he had an abnormal stress test at the [facility].  He was told he would need a stent.  He would like to get this done at [name].  I will make the referral.

## 2017-11-23 ENCOUNTER — Ambulatory Visit: Attending: Specialist | Primary: Family Medicine

## 2017-11-23 ENCOUNTER — Ambulatory Visit: Admit: 2017-11-23 | Discharge: 2017-11-23 | Payer: MEDICARE | Attending: Specialist | Primary: Family Medicine

## 2017-11-23 DIAGNOSIS — I2510 Atherosclerotic heart disease of native coronary artery without angina pectoris: Secondary | ICD-10-CM

## 2017-11-23 NOTE — Progress Notes (Signed)
Elijah [name]     [birth year]       Elijah [name] [name] MD, [facility]  Date of Visit-11/23/2017   PCP is Donalda Ewings, Neale Burly, MD   [facility]  [facility]  HPI:  Elijah [name] is a 67 y.o. male   Pt f/u with Dr. Donalda Ewings in [place]. I had seen him in 2017. Hx of CABG 2011 in [place]. 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 Post surgery had fatigue with BB, changed to Bystolic. When last seen in 2017 was stable. He underwent a stress test in January 2018 with a fixed basal inferior defect and EF of 53%. Has lost 3 family members including son at young age, travels to [place].     Pt has been going to the [place]. They gave him a nuclear stress test and they suggested bariatric surgery. He was also told about some low blood flow and suggested a heart catheterization. Pt wants a second opinion on both of those procedures. Pt denies any chest pain. He has some SOB but associates it with his weight. Pt has osteomyelitis in the right foot which has limited his activity/mobility. Pt's brother died of a heart attack.     Denies chest pain, edema, syncope or shortness of breath at rest, has no tachycardia, palpitations or sense of arrhythmia.        Assessment/Plan:      1. CAD. Native vessel prior CABG x 5. I have reviewed images from July stress test. I do not see reversible ischemia. There is a fixed basal inferior defect that is unchanged from 2018. He appears well with no angina. I do not think he needs a cath. I would have no objection to bariatric surgery.   The report of the nuclear images is not available , the lexiscan stress report is just the uneventful infusion.    2. Obesity would agree that bariatric surgery is worth exploring, he has found a surgeon in [place] who has done many   Obesity led to OSA and worsens HTN  I have no objection to the planned  surgery. Patient seems to be at a low  risk for peri-operative severe adverse cardiac events.     3. HTN -stable on multiple meds  ACE, BB, CCB  BP Readings from Last 6 Encounters:   11/23/17 110/60   11/18/17 131/81   11/03/17 146/83   09/26/17 107/67   09/08/17 120/76   03/17/17 117/72      4. Lipids on high potency statin as appropriate for secondary prevention.  Lab Results   Component Value Date/Time    LDL, calculated 51 11/12/2016 05:01 PM        F/u in 1 year in [place]     Impression:   1. Coronary artery disease involving native coronary artery of native heart without angina pectoris    2. Hx of CABG    3. Shortness of breath    4. Morbid obesity due to excess calories ([place])    5. Essential hypertension, benign    6. Pure hypercholesterolemia    7. OSA on CPAP       Cardiac History:   CABG x 5 2011    NUKE 04-07-16 Lexiscan  SPECT images demonstrate a medium, fixed abnormality of moderate degree in the basal inferior region on the stress and rest images. Gated SPECT images reveals normal myocardial thickening and wall motion.  The left ventricular ejection fraction was calculated to be 53 %.     ROS-except as noted above.. A complete cardiac and respiratory are reviewed and negative except as above ; Resp-denies wheezing  or productive cough,. Const- No unusual weight loss or fever; Neuro-no recent seizure or CVA ; GI- No BRBPR, abdom pain, bloating ; GU- no  hematuria   see supplement sheet, initialed and to be scanned by staff  Past Medical History:   Diagnosis Date   ??? BPH (benign prostatic hyperplasia)    ??? CAD (coronary artery disease)    ??? History of vascular access device 09/08/2016    [facility] VAT, 5 FR double, R brachial, 48 cm with 1 cm out, LTA   ??? HLD (hyperlipidemia)    ??? HTN (hypertension)    ??? Morbid obesity due to excess calories ([place]) 04/24/2015   ??? OSA (obstructive sleep apnea)    ??? Polyneuropathy    ??? Tubular adenoma of colon     x2 on colonoscopy 09/18/15      Social Hx= reports that he has quit smoking. His smoking use included  cigarettes. He has never used smokeless tobacco. He reports that he does not drink alcohol or use drugs.     Exam and Labs:  BP 110/60 (BP 1 Location: Left arm, BP Patient Position: Sitting)    Pulse (!) 59    Resp 16    Ht '6\' 1"'  (1.854 m)    Wt (!) 354 lb (160.6 kg)    SpO2 97%    BMI 46.70 kg/m?? Constitutional:  NAD, comfortable  Head: NC,AT. Eyes: No scleral icterus. Neck:  Neck supple. No JVD present.Throat: moist mucous membranes.  Chest: Effort normal & normal respiratory excursion .Neurological: alert, conversant and oriented . Skin: Skin is not cold. No obvious systemic rash noted. Not diaphoretic. No erythema. Psychiatric:  Grossly normal mood and affect.  Behavior appears normal. Extremities:  no clubbing or cyanosis. Abdomen: non distended    Lungs:breath sounds normal. No stridor. distress, wheezes or  Rales.  Heart: normal rate, regular rhythm, normal S1, S2, no murmurs, rubs, clicks or gallops , PMI non displaced.    Edema: Edema is none.  Lab Results   Component Value Date/Time    Cholesterol, total 169 11/12/2016 05:01 PM    HDL Cholesterol 44 11/12/2016 05:01 PM    LDL, calculated 51 11/12/2016 05:01 PM    Triglyceride 369 (H) 11/12/2016 05:01 PM     Lab Results   Component Value Date/Time    Sodium 143 09/08/2017 03:38 PM    Potassium 4.5 09/08/2017 03:38 PM    Chloride 110 (H) 09/08/2017 03:38 PM    CO2 21 09/08/2017 03:38 PM    Anion gap 9 09/09/2016 12:16 AM    Glucose 95 09/08/2017 03:38 PM    BUN 20 09/08/2017 03:38 PM    Creatinine 1.08 09/08/2017 03:38 PM    BUN/Creatinine ratio 19 09/08/2017 03:38 PM    GFR est AA 81 09/08/2017 03:38 PM    GFR est non-AA 70 09/08/2017 03:38 PM    Calcium 9.3 09/08/2017 03:38 PM      Wt Readings from Last 3 Encounters:   11/23/17 (!) 354 lb (160.6 kg)   11/18/17 348 lb (157.9 kg)   11/03/17 (!) 352 lb (159.7 kg)      BP Readings from Last 3 Encounters:   11/23/17 110/60   11/18/17 131/81   11/03/17 146/83      Current Outpatient Medications  Medication Sig   ??? varicella-zoster (SHINGRIX) injection Shingrix, one injection now and repeat in 4-6 months. To be administered at Pharmacy. Fax confirmation to me at [phone].  Indications: Prevention of Shingles   ??? amLODIPine (NORVASC) 5 mg tablet TAKE ONE TABLET BY MOUTH EVERY DAY   ??? pregabalin (LYRICA) 300 mg capsule Take 1 Cap by mouth two (2) times a day. Max Daily Amount: 600 mg. 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 diclofenac EC (VOLTAREN) 50 mg EC tablet Take 1 Tab by mouth two (2) times daily as needed for Pain.   ??? cyanocobalamin (VITAMIN B12) 500 mcg tablet Take 1 Tab by mouth daily.   ??? cholecalciferol (VITAMIN D3) 5,000 unit tab tablet Take 1 Tab by mouth daily.   ??? lisinopril (PRINIVIL, ZESTRIL) 20 mg tablet TAKE 1 TABLET BY MOUTH EVERY DAY   ??? nebivolol (BYSTOLIC) 10 mg tablet TAKE 1 TABLET BY MOUTH EVERY DAY   ??? DULoxetine (CYMBALTA) 60 mg capsule Take 1 Cap by mouth daily.   ??? atorvastatin (LIPITOR) 40 mg tablet Take 80 mg by mouth nightly.   ??? co-enzyme Q-10 (CO Q-10) 100 mg capsule Take 100 mg by mouth nightly.   ??? Flaxseed Oil oil Take 1 Cap by mouth daily.   ??? cpap machine kit by Does Not Apply route.   ??? aspirin (ASPIRIN) 325 mg tablet Take 1 Tab by mouth daily. (Patient taking differently: Take 81 mg by mouth daily.)   ??? DOCOSAHEXANOIC ACID/EPA (FISH OIL PO) Take 1 Cap by mouth daily.     No current facility-administered medications for this visit.       Impression see above.      Written by Shela Nevin, [place], as dictated by Cammy Copa, MD.

## 2017-11-23 NOTE — Progress Notes (Signed)
Visit Vitals  BP 110/60 (BP 1 Location: Left arm, BP Patient Position: Sitting)   Pulse (!) 59   Resp 16   Ht 6' 1" (1.854 m)   Wt (!) 354 lb (160.6 kg)   SpO2 97%   BMI 46.70 kg/m??

## 2017-11-23 NOTE — Patient Instructions (Addendum)
You will be scheduled for an annual follow up in [place].  Please call [place] at [phone] with any questions.

## 2017-11-23 NOTE — Progress Notes (Addendum)
Progress Notes by Cammy Copa, MD at 11/23/17 1120                Author: Cammy Copa, MD  Service: --  Author Type: Physician       Filed: 11/23/17 1315  Encounter Date: 11/23/2017  Status: Signed          Editor: Cammy Copa, MD (Physician)               Elijah [name]      03/03/1951       Elijah [name]   Date of Visit-11/23/2017    PCP is Elijah [name], Elijah Burly, MD    [facility]   [facility]   HPI:   Elijah [name] is a 67 y.o. male     Pt f/u with Dr. Donalda [name] in [place]. I had seen him in 2017. Hx of CABG 2011 in [place]. 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 Post surgery had fatigue with BB, changed to Bystolic. When  last seen in 2017 was stable. He underwent a stress test in January 2018 with a fixed basal inferior defect and EF of 53%. Has lost 3 family members including son at young age, travels to [place].       Pt has been going to the [place]. They gave him a nuclear stress test and they suggested bariatric surgery. He was also told about some low blood flow and suggested a heart catheterization. Pt wants a second opinion on both of those procedures. Pt denies  any chest pain. He has some SOB but associates it with his weight. Pt has osteomyelitis in the right foot which has limited his activity/mobility. Pt's brother died of a heart attack.       Denies chest pain, edema, syncope or shortness of breath at rest, has no tachycardia, palpitations or sense of arrhythmia.               Assessment/Plan:       1. CAD. Native vessel prior CABG x 5. I have reviewed images from July stress test. I do not see reversible ischemia. There is a fixed basal inferior defect that is unchanged from 2018. He appears well with no angina. I do not think he needs a cath.  I would have no objection to bariatric surgery.    The report of the nuclear images is not available , the  lexiscan stress report is just the uneventful infusion.      2. Obesity would agree that bariatric surgery is worth exploring, he has found a surgeon in [place] who has done many    Obesity led to OSA and worsens HTN   I have no objection to the planned  surgery. Patient seems to be at a low risk for peri-operative severe adverse cardiac events.       3. HTN -stable on multiple meds  ACE, BB, CCB   BP Readings from Last 6 Encounters:      11/23/17  110/60      11/18/17  131/81      11/03/17  146/83      09/26/17  107/67      09/08/17  120/76      03/17/17  117/72            4. Lipids on high potency statin as appropriate for secondary prevention.   Lab Results  Component  Value  Date/Time        LDL, calculated  51  11/12/2016 05:01 PM               F/u in 1 year in [place]        Impression:       1.  Coronary artery disease involving native coronary artery of native heart without angina pectoris      2.  Hx of CABG      3.  Shortness of breath      4.  Morbid obesity due to excess calories ([place])      5.  Essential hypertension, benign      6.  Pure hypercholesterolemia         7.  OSA on CPAP          Cardiac History:    CABG x 5 2011      NUKE 04-07-16 Lexiscan   SPECT images demonstrate a medium, fixed abnormality of moderate degree in the basal inferior region on the stress and rest images. Gated SPECT images reveals normal myocardial  thickening and wall motion. The left ventricular ejection fraction was calculated to be 53 %.       ROS-except as noted above.. A complete cardiac and respiratory are reviewed and negative except as above  ; Resp-denies wheezing  or productive cough,. Const- No unusual weight loss or fever; Neuro-no recent seizure or CVA ; GI- No BRBPR, abdom pain, bloating ; GU- no  hematuria    see supplement sheet, initialed and to be scanned by staff     Past Medical History:        Diagnosis  Date         ?  BPH (benign prostatic hyperplasia)       ?  CAD (coronary artery disease)        ?  History of vascular access device  09/08/2016          [facility] VAT, 5 FR double, R brachial, 48 cm with 1 cm out, LTA         ?  HLD (hyperlipidemia)       ?  HTN (hypertension)       ?  Morbid obesity due to excess calories ([place])  04/24/2015     ?  OSA (obstructive sleep apnea)       ?  Polyneuropathy       ?  Tubular adenoma of colon            x2 on colonoscopy 09/18/15         Social Hx= reports that he has quit smoking. His smoking use included cigarettes. He has never used smokeless tobacco. He reports that  he does not drink alcohol or use drugs.       Exam and Labs:   BP 110/60 (BP 1 Location: Left arm, BP Patient Position: Sitting)    Pulse (!) 59    Resp 16    Ht '6\' 1"'$  (1.854 m)    Wt (!) 354 lb (160.6 kg)    SpO2 97%    BMI 46.70 kg/m?? Constitutional :  NAD, comfortable   Head: NC,AT. Eyes : No scleral icterus. Neck :  Neck supple. No JVD present.Throat : moist mucous membranes.  Chest: Effort normal & normal respiratory excursion . Neurological: alert, conversant and oriented . Skin : Skin is not cold. No obvious systemic rash noted. Not diaphoretic. No erythema. Psychiatric :  Grossly  normal mood and affect.  Behavior appears normal. Extremities:  no clubbing or cyanosis.  Abdomen: non distended      Lungs:breath sounds normal. No stridor. distress, wheezes  or  Rales.   Heart: normal rate, regular rhythm, normal S1, S2, no murmurs, rubs, clicks or gallops  , PMI non displaced.     Edema: Edema is none.     Lab Results         Component  Value  Date/Time            Cholesterol, total  169  11/12/2016 05:01 PM       HDL Cholesterol  44  11/12/2016 05:01 PM       LDL, calculated  51  11/12/2016 05:01 PM            Triglyceride  369 (H)  11/12/2016 05:01 PM          Lab Results         Component  Value  Date/Time            Sodium  143  09/08/2017 03:38 PM       Potassium  4.5  09/08/2017 03:38 PM       Chloride  110 (H)  09/08/2017 03:38 PM       CO2  21  09/08/2017 03:38 PM       Anion gap  9  09/09/2016  12:16 AM       Glucose  95  09/08/2017 03:38 PM       BUN  20  09/08/2017 03:38 PM       Creatinine  1.08  09/08/2017 03:38 PM       BUN/Creatinine ratio  19  09/08/2017 03:38 PM       GFR est AA  81  09/08/2017 03:38 PM       GFR est non-AA  70  09/08/2017 03:38 PM            Calcium  9.3  09/08/2017 03:38 PM           Wt Readings from Last 3 Encounters:        11/23/17  (!) 354 lb (160.6 kg)     11/18/17  348 lb (157.9 kg)        11/03/17  (!) 352 lb (159.7 kg)           BP Readings from Last 3 Encounters:        11/23/17  110/60     11/18/17  131/81        11/03/17  146/83           Current Outpatient Medications        Medication  Sig         ?  varicella-zoster (SHINGRIX) injection  Shingrix, one injection now and repeat in 4-6 months. To be administered at Pharmacy. Fax confirmation to me at [phone].  Indications: Prevention  of Shingles     ?  amLODIPine (NORVASC) 5 mg tablet  TAKE ONE TABLET BY MOUTH EVERY DAY     ?  pregabalin (LYRICA) 300 mg capsule  Take 1 Cap by mouth two (2) times a day. Max Daily Amount: 600 mg. 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  diclofenac EC (VOLTAREN) 50 mg EC tablet  Take 1 Tab by mouth two (2) times daily as needed for Pain.     ?  cyanocobalamin (VITAMIN B12) 500 mcg tablet  Take 1 Tab by mouth daily.     ?  cholecalciferol (VITAMIN D3) 5,000 unit tab tablet  Take 1 Tab by mouth daily.     ?  lisinopril (PRINIVIL, ZESTRIL) 20 mg tablet  TAKE 1 TABLET BY MOUTH EVERY DAY     ?  nebivolol (BYSTOLIC) 10 mg tablet  TAKE 1 TABLET BY MOUTH EVERY DAY     ?  DULoxetine (CYMBALTA) 60 mg capsule  Take 1 Cap by mouth daily.     ?  atorvastatin (LIPITOR) 40 mg tablet  Take 80 mg by mouth nightly.     ?  co-enzyme Q-10 (CO Q-10) 100 mg capsule  Take 100 mg by mouth nightly.     ?  Flaxseed Oil oil  Take 1 Cap by mouth daily.     ?  cpap machine kit  by Does Not Apply route.     ?  aspirin (ASPIRIN) 325 mg tablet  Take 1 Tab by mouth daily.  (Patient taking differently: Take 81 mg by mouth daily.)         ?  DOCOSAHEXANOIC ACID/EPA (FISH OIL PO)  Take 1 Cap by mouth daily.          No current facility-administered medications for this visit.          Impression see above.        Written by Shela Nevin, [place], as dictated by Cammy Copa, MD.

## 2017-11-23 NOTE — Progress Notes (Signed)
 Visit Vitals  BP 110/60 (BP 1 Location: Left arm, BP Patient Position: Sitting)   Pulse (!) 59   Resp 16   Ht 6' 1 (1.854 m)   Wt (!) 354 lb (160.6 kg)   SpO2 97%   BMI 46.70 kg/m

## 2017-12-06 ENCOUNTER — Encounter: Attending: Student in an Organized Health Care Education/Training Program | Primary: Family Medicine

## 2017-12-07 ENCOUNTER — Encounter

## 2017-12-08 ENCOUNTER — Encounter: Attending: Student in an Organized Health Care Education/Training Program | Primary: Family Medicine

## 2017-12-08 MED ORDER — LISINOPRIL 20 MG TAB
20 mg | ORAL_TABLET | ORAL | 0 refills | Status: DC
Start: 2017-12-08 — End: 2018-06-02

## 2017-12-09 ENCOUNTER — Ambulatory Visit: Attending: Student in an Organized Health Care Education/Training Program | Primary: Family Medicine

## 2017-12-09 ENCOUNTER — Ambulatory Visit
Admit: 2017-12-09 | Discharge: 2017-12-09 | Payer: MEDICARE | Attending: Student in an Organized Health Care Education/Training Program | Primary: Family Medicine

## 2017-12-09 DIAGNOSIS — Z6841 Body Mass Index (BMI) 40.0 and over, adult: Secondary | ICD-10-CM

## 2017-12-09 NOTE — Progress Notes (Signed)
[facility]    Subjective:   Elijah [name] is a 67 y.o. male with history of HTN, CAD s/p CABG, BPH, OSA, HLD   CC: weight loss clinic  History provided by patient    HPI:  He is having bariatric surgery and he has to go through weight loss clinic for 6 months. He feels that the surgery is the only way to keep his weight off.    He eats a lot of chicken. He eats green beans, peas, and corn. Turnip salad. Canned fruit, Apple, pears, and bananas. He eats blueberries, canned peaches. He doesn't eat pastas or breads much. Rarely desserts. He hardly has junk food and snacks. He states that he drinks two low carb beers at night as his treat. Every morning, he has a Consulting civil engineer breakfast sandwich.    Pt states that he has tried every diet. He has tried Atkin's about 20 years ago, and he lost weight on it, but he didn't keep it up because it made him grumpy.    He was doing exercise until he had osteomyelitis. He states it swells. He also has an injured meniscus on the right side.                 PFSH:     Current Outpatient Medications on File Prior to Visit   Medication Sig Dispense Refill   ??? amLODIPine (NORVASC) 5 mg tablet TAKE ONE TABLET BY MOUTH EVERY DAY 90 Tab 2   ??? pregabalin (LYRICA) 300 mg capsule Take 1 Cap by mouth two (2) times a day. Max Daily Amount: 600 mg. 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 cyanocobalamin (VITAMIN B12) 500 mcg tablet Take 1 Tab by mouth daily. 90 Tab 3   ??? cholecalciferol (VITAMIN D3) 5,000 unit tab tablet Take 1 Tab by mouth daily. 90 Tab 3   ??? nebivolol (BYSTOLIC) 10 mg tablet TAKE 1 TABLET BY MOUTH EVERY DAY 90 Tab 1   ??? DULoxetine (CYMBALTA) 60 mg capsule Take 1 Cap by mouth daily. 90 Cap 4   ??? atorvastatin (LIPITOR) 40 mg tablet Take 80 mg by mouth nightly.     ??? co-enzyme Q-10 (CO Q-10) 100 mg capsule Take 100 mg by mouth nightly.     ??? Flaxseed Oil oil Take 1 Cap by mouth daily.      ??? cpap machine kit by Does Not Apply route.     ??? aspirin (ASPIRIN) 325 mg tablet Take 1 Tab by mouth daily. (Patient taking differently: Take 81 mg by mouth daily.) 90 Tab 0   ??? DOCOSAHEXANOIC ACID/EPA (FISH OIL PO) Take 1 Cap by mouth daily.     ??? lisinopril (PRINIVIL, ZESTRIL) 20 mg tablet TAKE 1 TABLET BY MOUTH DAILY 60 Tab 0   ??? varicella-zoster (SHINGRIX) injection Shingrix, one injection now and repeat in 4-6 months. To be administered at Pharmacy. Fax confirmation to me at [phone].  Indications: Prevention of Shingles 2 Each 0   ??? diclofenac EC (VOLTAREN) 50 mg EC tablet Take 1 Tab by mouth two (2) times daily as needed for Pain. 60 Tab 1     No current facility-administered medications on file prior to visit.        Patient Active Problem List   Diagnosis Code   ??? Essential hypertension, benign I10   ??? HLD (hyperlipidemia) E78.5   ??? Coronary atherosclerosis of native coronary artery I25.10   ???  OSA on CPAP G47.33, Z99.89   ??? Morbid obesity due to excess calories ([facility]) E66.01   ??? Tubular adenoma of colon D12.6   ??? Polyneuropathy G62.9   ??? Benign prostatic hyperplasia N40.0   ??? Pure hypercholesterolemia E78.00   ??? 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 Osteomyelitis ([place]) M86.9   ??? Subacute osteomyelitis of right foot ([place]) M86.271   ??? Chronic fatigue R53.82       Social History     Socioeconomic History   ??? Marital status: MARRIED     Spouse name: Not on file   ??? Number of children: Not on file   ??? Years of education: Not on file   ??? Highest education level: Not on file   Occupational History   ??? Not on file   Social Needs   ??? Financial resource strain: Not on file   ??? Food insecurity:     Worry: Not on file     Inability: Not on file   ??? Transportation needs:     Medical: Not on file     Non-medical: Not on file   Tobacco Use   ??? Smoking status: Former Smoker     Types: Cigarettes   ??? Smokeless tobacco: Never Used   Substance and Sexual Activity   ??? Alcohol use: No      Comment: recently has stopped   ??? Drug use: No   ??? Sexual activity: Not on file   Lifestyle   ??? Physical activity:     Days per week: Not on file     Minutes per session: Not on file   ??? Stress: Not on file   Relationships   ??? Social connections:     Talks on phone: Not on file     Gets together: Not on file     Attends religious service: Not on file     Active member of club or organization: Not on file     Attends meetings of clubs or organizations: Not on file     Relationship status: Not on file   ??? Intimate partner violence:     Fear of current or ex partner: Not on file     Emotionally abused: Not on file     Physically abused: Not on file     Forced sexual activity: Not on file   Other Topics Concern   ??? Not on file   Social History Narrative   ??? Not on file       Review of Systems   Constitutional: Negative for malaise/fatigue and weight loss.   Cardiovascular: Negative for chest pain and palpitations.         Objective:     Visit Vitals  BP 109/59 (BP 1 Location: Left arm, BP Patient Position: Sitting)   Pulse 60   Temp 97.6 ??F (36.4 ??C) (Oral)   Resp 20   Ht '6\' 1"'  (1.854 m)   Wt (!) 357 lb (161.9 kg)   SpO2 95%   BMI 47.10 kg/m??          Physical Exam   Constitutional: He appears well-developed and well-nourished. No distress.   HENT:   Head: Normocephalic and atraumatic.   Skin: He is not diaphoretic.   Psychiatric: He has a normal mood and affect. His behavior is normal.       Pertinent Labs/Studies: none      Assessment and orders:       ICD-10-CM ICD-9-CM  1. Class 3 severe obesity due to excess calories with serious comorbidity and body mass index (BMI) of 45.0 to 49.9 in adult ([place]) E66.01 278.01     Z68.42 V85.42      Encounter Diagnoses   Name Primary?   ??? Class 3 severe obesity due to excess calories with serious comorbidity and body mass index (BMI) of 45.0 to 49.9 in adult [facility]) Yes     Diagnoses and all orders for this visit:     1. Class 3 severe obesity due to excess calories with serious comorbidity and body mass index (BMI) of 45.0 to 49.9 in adult [facility]) - Pt feels that he has exhausted all lifestyle interventions and that the only thing that will work at this point. He is open to suggestion if he is provided with a method that he has not used before. He has tried various diets and has lost weight and then gained it again. He states he had problems with his weight even when he was young in the [employer]  -    Provided resource (name of book) that collectively describes scientific evidence behind reducing carbohydrates in diet. Discussed hormonal role in weight gain.  -    Discussed various diets to try, pt has not decided on one right now  -    Discussed increasing vegetable (non-starchy vegetables: green beans, broccoli, leafy greens, cauliflower) intake and lowering other side dish intake.      Follow-up and Dispositions    ?? Return in about 4 weeks (around 01/06/2018).           I have discussed the diagnosis with the patient and the intended plan as seen in the above orders.  Social history, medical history, and labs were reviewed.  The patient has received an after-visit summary and questions were answered concerning future plans.  I have discussed medication side effects and warnings with the patient as well.    Lillie Fragmin, MD  [facility]  12/12/17

## 2017-12-09 NOTE — Progress Notes (Signed)
I reviewed with the resident the medical history and the resident's findings on the physical examination.  I discussed with the resident the patient's diagnosis and concur with the plan.

## 2017-12-09 NOTE — Patient Instructions (Addendum)
Why We Get Fat and What to Do About It by Malka SoGary Taubes     Learning About Bariatric Surgery  What is bariatric surgery?    Bariatric surgery is surgery to help you lose weight. This type of surgery is only used for people who are very overweight and have not been able to lose weight with diet and exercise.  This surgery makes the stomach smaller. Some types of surgery also change the connection between your stomach and intestines.  How is bariatric surgery done?  Bariatric surgery may be either "open" or "laparoscopic." Open surgery is done through a large cut (incision) in the belly. Laparoscopic surgery is done through several small cuts. The doctor puts a lighted tube, or scope, and other surgical tools through small cuts in your belly. The doctor is able to see your organs with the scope. There are different types of bariatric surgery.  Gastric sleeve surgery  The surgery is usually done through several small incisions in the belly. The doctor removes more than half of your stomach. This leaves a thin sleeve, or tube, that is about the size of a banana. Because part of your stomach has been removed, this can't be reversed.  Roux-en-Y gastric bypass surgery  Roux-en-Y (say "roo-en-why") surgery changes the connection between the stomach and the intestines.  The doctor separates a section of your stomach from the rest of your stomach. This makes a small pouch. The new pouch will hold the food you eat. The doctor connects the stomach pouch to the middle part of the small intestine.  Gastric banding surgery  The surgery is usually done through several small incisions in the belly. The doctor wraps a band around the upper part of the stomach. This creates a small pouch. The small size of the pouch means that you will get full after you eat just a small amount of food. The doctor can inflate or deflate the band to adjust the size. This lets the doctor adjust how  quickly food passes from the new pouch into the stomach. It does not change the connection between the stomach and the intestines.  What can you expect after the surgery?  You may stay in the hospital for one or more days after the surgery. How long you stay depends on the type of surgery you had.  Most people need 2 to 4 weeks before they are ready to get back to their usual routine.  For the first 2 to 6 weeks after surgery, you probably will need to follow a liquid or soft diet. Bit by bit, you will be able to eat more solid foods. Your doctor may advise you to work with a dietitian. This way you'll be sure to get enough protein, vitamins, and minerals while you are losing weight. Even with a healthy diet, you may need to take vitamin and mineral supplements.  After surgery, you will not be able to eat very much at one time. You will get full quickly. Try not to eat too much at one time or eat foods that are high in fat or sugar. If you do, you may vomit, get stomach pain, or have diarrhea.  You probably will lose weight very quickly in the first few months after surgery. As time goes on, your weight loss will slow down. You will have regular doctor visits to check how you are doing.  Think of bariatric surgery as a tool to help you lose weight. It isn't an instant fix. You  will still need to eat a healthy diet and get regular exercise. This will help you reach your weight goal and avoid regaining the weight you lose.  Follow-up care is a key part of your treatment and safety. Be sure to make and go to all appointments, and call your doctor if you are having problems. It's also a good idea to know your test results and keep a list of the medicines you take.  Where can you learn more?  Go to [employer].  Enter G469 in the search box to learn more about "Learning About Bariatric Surgery."  Current as of: June 23, 2017  Content Version: 12.1   ?? 2006-2019 [employer]. Care instructions adapted under license by [employer] (which disclaims liability or warranty for this information). If you have questions about a medical condition or this instruction, always ask your healthcare professional. [employer], Inc[employer] disclaims any warranty or liability for your use of this information.

## 2017-12-09 NOTE — Addendum Note (Signed)
Addended by: Eugene GarnetESQUIVEL, Colin Ellers on: 12/28/2017 03:29 PM     Modules accepted: Orders

## 2017-12-09 NOTE — Addendum Note (Signed)
Addendum Note by Eugene GarnetEsquivel, Allesha Aronoff, MD at 12/09/17 0800                Author: Eugene GarnetEsquivel, Hyun Marsalis, MD  Service: --  Author Type: Physician       Filed: 12/28/17 1529  Encounter Date: 12/09/2017  Status: Signed          Editor: Eugene GarnetEsquivel, Broox Lonigro, MD (Physician)          Addended by: Eugene GarnetESQUIVEL, Dorthea Maina on: 12/28/2017 03:29 PM    Modules accepted: Orders

## 2017-12-09 NOTE — Progress Notes (Signed)
[facility]    Subjective:   Elijah [name] is a 67 y.o. male with history of HTN, CAD s/p CABG, BPH, OSA, HLD   CC: weight loss clinic  History provided by patient    HPI:  He is having bariatric surgery and he has to go through weight loss clinic for 6 months. He feels that the surgery is the only way to keep his weight off.    He eats a lot of chicken. He eats green beans, peas, and corn. Turnip salad. Canned fruit, Apple, pears, and bananas. He eats blueberries, canned peaches. He doesn't eat pastas or breads much. Rarely desserts. He hardly has junk food and snacks. He states that he drinks two low carb beers at night as his treat. Every morning, he has a Consulting civil engineer breakfast sandwich.    Pt states that he has tried every diet. He has tried Atkin's about 20 years ago, and he lost weight on it, but he didn't keep it up because it made him grumpy.    He was doing exercise until he had osteomyelitis. He states it swells. He also has an injured meniscus on the right side.                 PFSH:     Current Outpatient Medications on File Prior to Visit   Medication Sig Dispense Refill   ??? amLODIPine (NORVASC) 5 mg tablet TAKE ONE TABLET BY MOUTH EVERY DAY 90 Tab 2   ??? pregabalin (LYRICA) 300 mg capsule Take 1 Cap by mouth two (2) times a day. Max Daily Amount: 600 mg. 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 cyanocobalamin (VITAMIN B12) 500 mcg tablet Take 1 Tab by mouth daily. 90 Tab 3   ??? cholecalciferol (VITAMIN D3) 5,000 unit tab tablet Take 1 Tab by mouth daily. 90 Tab 3   ??? nebivolol (BYSTOLIC) 10 mg tablet TAKE 1 TABLET BY MOUTH EVERY DAY 90 Tab 1   ??? DULoxetine (CYMBALTA) 60 mg capsule Take 1 Cap by mouth daily. 90 Cap 4   ??? atorvastatin (LIPITOR) 40 mg tablet Take 80 mg by mouth nightly.     ??? co-enzyme Q-10 (CO Q-10) 100 mg capsule Take 100 mg by mouth nightly.     ??? Flaxseed Oil oil Take 1 Cap by mouth daily.     ??? cpap machine kit by Does  Not Apply route.     ??? aspirin (ASPIRIN) 325 mg tablet Take 1 Tab by mouth daily. (Patient taking differently: Take 81 mg by mouth daily.) 90 Tab 0   ??? DOCOSAHEXANOIC ACID/EPA (FISH OIL PO) Take 1 Cap by mouth daily.     ??? lisinopril (PRINIVIL, ZESTRIL) 20 mg tablet TAKE 1 TABLET BY MOUTH DAILY 60 Tab 0   ??? varicella-zoster (SHINGRIX) injection Shingrix, one injection now and repeat in 4-6 months. To be administered at Pharmacy. Fax confirmation to me at [phone].  Indications: Prevention of Shingles 2 Each 0   ??? diclofenac EC (VOLTAREN) 50 mg EC tablet Take 1 Tab by mouth two (2) times daily as needed for Pain. 60 Tab 1     No current facility-administered medications on file prior to visit.        Patient Active Problem List   Diagnosis Code   ??? Essential hypertension, benign I10   ??? HLD (hyperlipidemia) E78.5   ??? Coronary atherosclerosis of native coronary artery I25.10   ???  OSA on CPAP G47.33, Z99.89   ??? Morbid obesity due to excess calories (HCC) E66.01   ??? Tubular adenoma of colon D12.6   ??? Polyneuropathy G62.9   ??? Benign prostatic hyperplasia N40.0   ??? Pure hypercholesterolemia E78.00   ??? 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 Osteomyelitis ([place]) M86.9   ??? Subacute osteomyelitis of right foot ([place]) M86.271   ??? Chronic fatigue R53.82       Social History     Socioeconomic History   ??? Marital status: MARRIED     Spouse name: Not on file   ??? Number of children: Not on file   ??? Years of education: Not on file   ??? Highest education level: Not on file   Occupational History   ??? Not on file   Social Needs   ??? Financial resource strain: Not on file   ??? Food insecurity:     Worry: Not on file     Inability: Not on file   ??? Transportation needs:     Medical: Not on file     Non-medical: Not on file   Tobacco Use   ??? Smoking status: Former Smoker     Types: Cigarettes   ??? Smokeless tobacco: Never Used   Substance and Sexual Activity   ??? Alcohol use: No     Comment: recently has stopped   ??? Drug use: No   ???  Sexual activity: Not on file   Lifestyle   ??? Physical activity:     Days per week: Not on file     Minutes per session: Not on file   ??? Stress: Not on file   Relationships   ??? Social connections:     Talks on phone: Not on file     Gets together: Not on file     Attends religious service: Not on file     Active member of club or organization: Not on file     Attends meetings of clubs or organizations: Not on file     Relationship status: Not on file   ??? Intimate partner violence:     Fear of current or ex partner: Not on file     Emotionally abused: Not on file     Physically abused: Not on file     Forced sexual activity: Not on file   Other Topics Concern   ??? Not on file   Social History Narrative   ??? Not on file       Review of Systems   Constitutional: Negative for malaise/fatigue and weight loss.   Cardiovascular: Negative for chest pain and palpitations.         Objective:     Visit Vitals  BP 109/59 (BP 1 Location: Left arm, BP Patient Position: Sitting)   Pulse 60   Temp 97.6 ??F (36.4 ??C) (Oral)   Resp 20   Ht '6\' 1"'  (1.854 m)   Wt (!) 357 lb (161.9 kg)   SpO2 95%   BMI 47.10 kg/m??          Physical Exam   Constitutional: He appears well-developed and well-nourished. No distress.   HENT:   Head: Normocephalic and atraumatic.   Skin: He is not diaphoretic.   Psychiatric: He has a normal mood and affect. His behavior is normal.       Pertinent Labs/Studies: none      Assessment and orders:       ICD-10-CM ICD-9-CM  1. Class 3 severe obesity due to excess calories with serious comorbidity and body mass index (BMI) of 45.0 to 49.9 in adult ([place]) E66.01 278.01     Z68.42 V85.42      Encounter Diagnoses   Name Primary?   ??? Class 3 severe obesity due to excess calories with serious comorbidity and body mass index (BMI) of 45.0 to 49.9 in adult [facility]) Yes     Diagnoses and all orders for this visit:    1. Class 3 severe obesity due to excess calories with serious comorbidity and body mass index (BMI) of 45.0 to 49.9  in adult [facility]) - Pt feels that he has exhausted all lifestyle interventions and that the only thing that will work at this point. He is open to suggestion if he is provided with a method that he has not used before. He has tried various diets and has lost weight and then gained it again. He states he had problems with his weight even when he was young in the [employer]  -    Provided resource (name of book) that collectively describes scientific evidence behind reducing carbohydrates in diet. Discussed hormonal role in weight gain.  -    Discussed various diets to try, pt has not decided on one right now  -    Discussed increasing vegetable (non-starchy vegetables: green beans, broccoli, leafy greens, cauliflower) intake and lowering other side dish intake.      Follow-up and Dispositions    ?? Return in about 4 weeks (around 01/06/2018).           I have discussed the diagnosis with the patient and the intended plan as seen in the above orders.  Social history, medical history, and labs were reviewed.  The patient has received an after-visit summary and questions were answered concerning future plans.  I have discussed medication side effects and warnings with the patient as well.    Lillie Fragmin, MD  Resident [facility]  12/12/17

## 2017-12-12 NOTE — Telephone Encounter (Signed)
Verified patient with two types of identifiers. Let patient know that per Dr. Nyoka CowdenSabharwal's last office note he does not think patient needs a cardiac cath at this time. He just wanted to confirm this. He denies any chest pain and will call with any other questions or concerns.

## 2017-12-12 NOTE — Telephone Encounter (Signed)
[facility] and he wants to know if he needs it. Pt was not happy with his wait time.

## 2017-12-14 NOTE — Telephone Encounter (Signed)
Yes best as I can tell I do not think he needs a cath unless he is getting chest pain , angina  Obviously the [facility] may have a different opinion and he can choose what course of action he thinks is best

## 2018-01-04 ENCOUNTER — Ambulatory Visit: Attending: Family Medicine | Primary: Family Medicine

## 2018-01-04 ENCOUNTER — Ambulatory Visit: Admit: 2018-01-04 | Discharge: 2018-01-04 | Payer: MEDICARE | Attending: Family Medicine | Primary: Family Medicine

## 2018-01-04 DIAGNOSIS — Z01818 Encounter for other preprocedural examination: Secondary | ICD-10-CM

## 2018-01-04 MED ORDER — PNEUMOCOCCAL 23-VALPS VACCINE 25 MCG/0.5 ML INJECTION
25 mcg/0.5 mL | Freq: Once | INTRAMUSCULAR | 0 refills | Status: AC
Start: 2018-01-04 — End: 2018-01-04

## 2018-01-04 NOTE — Progress Notes (Signed)
[facility]  [address]  [place], [place]  [phone]           Progress Note    Patient: Elijah [name] MRN: [id number]  SSN: [id number]    Date of Birth: Jan 28, 1951  Age: 66 y.o.  Sex: male        Chief Complaint   Patient presents with   ??? Pre-op Exam         Subjective:     Encounter Diagnoses   Name Primary?   ??? Pre-op evaluation: He has to reschedule his gastric sleeve operation for December.  He is already been cleared by cardiology to have this done.  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  This was confirmed by echocardiogram in 2018.  Since that time he has had additional testing in the [facility] system which Dr. Daniel Nones has also reviewed.     Yes   ??? Essential hypertension, benign:  BP Readings from Last 3 Encounters:   01/04/18 132/73   12/09/17 109/59   11/23/17 110/60     The patient reports:  taking medications as instructed, no medication side effects noted, no TIA's, no chest pain on exertion, no dyspnea on exertion, no swelling of ankles.    Key CAD CHF Meds             lisinopril (PRINIVIL, ZESTRIL) 20 mg tablet (Taking) TAKE 1 TABLET BY MOUTH DAILY    amLODIPine (NORVASC) 5 mg tablet (Taking) TAKE ONE TABLET BY MOUTH EVERY DAY    nebivolol (BYSTOLIC) 10 mg tablet (Taking) TAKE 1 TABLET BY MOUTH EVERY DAY    atorvastatin (LIPITOR) 40 mg tablet (Taking) Take 80 mg by mouth nightly.    aspirin (ASPIRIN) 325 mg tablet (Taking) Take 1 Tab by mouth daily.    DOCOSAHEXANOIC ACID/EPA (FISH OIL PO) (Taking) Take 1 Cap by mouth daily.           Lab Results   Component Value Date/Time    Sodium 143 09/08/2017 03:38 PM    Potassium 4.5 09/08/2017 03:38 PM    Chloride 110 (H) 09/08/2017 03:38 PM    CO2 21 09/08/2017 03:38 PM    Anion gap 9 09/09/2016 12:16 AM    Glucose 95 09/08/2017 03:38 PM    BUN 20 09/08/2017 03:38 PM    Creatinine 1.08 09/08/2017 03:38 PM    BUN/Creatinine ratio 19 09/08/2017 03:38 PM     GFR est AA 81 09/08/2017 03:38 PM    GFR est non-AA 70 09/08/2017 03:38 PM    Calcium 9.3 09/08/2017 03:38 PM    Bilirubin, total 0.6 09/08/2017 03:38 PM    AST (SGOT) 20 09/08/2017 03:38 PM    Alk. phosphatase 71 09/08/2017 03:38 PM    Protein, total 6.7 09/08/2017 03:38 PM    Albumin 3.9 09/08/2017 03:38 PM    Globulin 4.1 (H) 09/09/2016 12:16 AM    A-G Ratio 1.4 09/08/2017 03:38 PM    ALT (SGPT) 20 09/08/2017 03:38 PM     Low salt diet?yes  Aerobic exercise? no  Our goal is to normalize the blood pressure to decrease the risks of strokes and heart attacks. The patient is in agreement with the plan.          ??? Pure hypercholesterolemia:  Cardiovascular risks for him are: LDL goal is under 80  existing CAD  hypertension  hyperlipidemia.   Key Antihyperlipidemia Meds  atorvastatin (LIPITOR) 40 mg tablet (Taking) Take 80 mg by mouth nightly.    DOCOSAHEXANOIC ACID/EPA (FISH OIL PO) (Taking) Take 1 Cap by mouth daily.        Lab Results   Component Value Date/Time    Cholesterol, total 169 11/12/2016 05:01 PM    HDL Cholesterol 44 11/12/2016 05:01 PM    LDL, calculated 51 11/12/2016 05:01 PM    Triglyceride 369 (H) 11/12/2016 05:01 PM     Lab Results   Component Value Date/Time    ALT (SGPT) 20 09/08/2017 03:38 PM    AST (SGOT) 20 09/08/2017 03:38 PM    Alk. phosphatase 71 09/08/2017 03:38 PM    Bilirubin, direct 0.08 09/20/2016 04:30 PM    Bilirubin, total 0.6 09/08/2017 03:38 PM      Myalgias: No   Fatigue: No   Other side effects: no  Wt Readings from Last 3 Encounters:   01/04/18 (!) 356 lb (161.5 kg)   12/09/17 (!) 357 lb (161.9 kg)   11/23/17 (!) 354 lb (160.6 kg)     The patient is aware of our goal to reduce or eliminate the long term problems (such as strokes and heart attacks) related to poorly controlled hyperlipidemia.          ??? Atherosclerosis of native coronary artery of native heart without angina pectoris:  No chest pain, DOE or SOB. No PND or orthopnea.          ??? OSA on CPAP:   Controlled on CPAP. 100% compliant. Feels rested in AM.        ??? 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  He is over that.        ??? Morbid obesity due to excess calories [facility]): He has failed all attempts at dieting and is preparing to have a gastric sleeve operation.  He has been cleared by cardiology.  He recently had a knee injury and will need a right knee replacement after he loses weight.          Current and past medical information:    Current Medications after this visit::     Current Outpatient Medications   Medication Sig   ??? pneumococcal 23-valent (PNEUMOVAX 23) 25 mcg/0.5 mL injection 0.5 mL by IntraMUSCular route once for 1 dose. To be administered at Pharmacy. Fax confirmation to me at [phone]. Thank You.   ??? lisinopril (PRINIVIL, ZESTRIL) 20 mg tablet TAKE 1 TABLET BY MOUTH DAILY   ??? amLODIPine (NORVASC) 5 mg tablet TAKE ONE TABLET BY MOUTH EVERY DAY   ??? pregabalin (LYRICA) 300 mg capsule Take 1 Cap by mouth two (2) times a day. Max Daily Amount: 600 mg. 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 cyanocobalamin (VITAMIN B12) 500 mcg tablet Take 1 Tab by mouth daily.   ??? cholecalciferol (VITAMIN D3) 5,000 unit tab tablet Take 1 Tab by mouth daily.   ??? nebivolol (BYSTOLIC) 10 mg tablet TAKE 1 TABLET BY MOUTH EVERY DAY   ??? DULoxetine (CYMBALTA) 60 mg capsule Take 1 Cap by mouth daily.   ??? atorvastatin (LIPITOR) 40 mg tablet Take 80 mg by mouth nightly.   ??? co-enzyme Q-10 (CO Q-10) 100 mg capsule Take 100 mg by mouth nightly.   ??? Flaxseed Oil oil Take 1 Cap by mouth daily.   ??? cpap machine kit by  Does Not Apply route.   ??? aspirin (ASPIRIN) 325 mg tablet Take 1 Tab by mouth daily. (Patient taking differently: Take 81 mg by mouth daily.)   ??? DOCOSAHEXANOIC ACID/EPA (FISH OIL PO) Take 1 Cap by mouth daily.      No current facility-administered medications for this visit.        Patient Active Problem List    Diagnosis Date Noted   ??? Polyneuropathy 05/28/2016     Priority: 1 - One   ??? Morbid obesity due to excess calories ([place]) 04/24/2015     Priority: 1 - One   ??? Essential hypertension, benign 02/21/2015     Priority: 1 - One   ??? HLD (hyperlipidemia) 02/21/2015     Priority: 1 - One   ??? Coronary atherosclerosis of native coronary artery 02/21/2015     Priority: 1 - One   ??? OSA on CPAP 02/21/2015     Priority: 1 - One   ??? Pure hypercholesterolemia 08/23/2013     Priority: 1 - One   ??? 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 Chronic fatigue 09/08/2017   ??? Tubular adenoma of colon 09/23/2015   ??? Benign prostatic hyperplasia 12/29/2006       Past Medical History:   Diagnosis Date   ??? BPH (benign prostatic hyperplasia)    ??? CAD (coronary artery disease)     CABG x 5 2011- nuke with fixed basal inferior defect [facility]   ??? History of vascular access device 09/08/2016    [facility] VAT, 5 FR double, R brachial, 48 cm with 1 cm out, LTA   ??? HLD (hyperlipidemia)    ??? HTN (hypertension)    ??? Morbid obesity due to excess calories ([place]) 04/24/2015   ??? OSA (obstructive sleep apnea)    ??? Polyneuropathy    ??? Tubular adenoma of colon     x2 on colonoscopy 09/18/15       Allergies   Allergen Reactions   ??? Metoprolol Other (comments)     Patient states, made me feel horrible.       Past Surgical History:   Procedure Laterality Date   ??? HX HIP REPLACEMENT Bilateral        Social History     Socioeconomic History   ??? Marital status: MARRIED     Spouse name: Not on file   ??? Number of children: Not on file   ??? Years of education: Not on file   ??? Highest education level: Not on file   Tobacco Use   ??? Smoking status: Former Smoker     Types: Cigarettes   ??? Smokeless tobacco: Never Used   Substance and Sexual Activity   ??? Alcohol use: No     Comment: recently has stopped   ??? Drug use: No        Review of Systems   Constitutional: Negative.  Negative for chills, fever, malaise/fatigue and weight loss.   HENT: Negative.  Negative for hearing loss.    Eyes: Negative.  Negative for blurred vision and double vision.   Respiratory: Negative.  Negative for cough, sputum production and shortness of breath.    Cardiovascular: Positive for leg swelling. Negative for chest pain and palpitations.        Venous stasis edema right leg   Gastrointestinal: Negative.  Negative for abdominal pain, blood in stool, heartburn, nausea and vomiting.   Genitourinary: Negative.  Negative for dysuria, frequency and urgency.   Musculoskeletal: Negative.  Negative for back pain, falls, myalgias and neck pain.   Skin: Negative.  Negative for rash.   Neurological: Negative.  Negative for dizziness, tingling, tremors, weakness and headaches.   Endo/Heme/Allergies: Negative.    Psychiatric/Behavioral: Negative.  Negative for depression.        Objective:     Vitals:    01/04/18 1052   BP: 132/73   Pulse: 60   Resp: 20   Temp: 98.6 ??F (37 ??C)   TempSrc: Oral   SpO2: 96%   Weight: (!) 356 lb (161.5 kg)   Height: '6\' 1"'  (1.854 m)      Body mass index is 46.97 kg/m??.    Physical Exam   Constitutional: He is oriented to person, place, and time and well-developed, well-nourished, and in no distress.   HENT:   Head: Normocephalic and atraumatic.   Mouth/Throat: Oropharynx is clear and moist.   Eyes: Right eye exhibits no discharge. Left eye exhibits no discharge. No scleral icterus.   Neck: No thyromegaly present.   No bruit.   Cardiovascular: Normal rate, regular rhythm and normal heart sounds. Exam reveals no friction rub.   No murmur heard.  Pulmonary/Chest: Effort normal and breath sounds normal. No respiratory distress. He has no wheezes. He has no rales.   Abdominal: Soft. He exhibits no distension. There is no tenderness.   Musculoskeletal: He exhibits edema.   Neurological: He is alert and oriented to person, place, and time.    Skin: No rash noted. No erythema.   Reports that his skin ulceration underneath his right toes has resolved.   Psychiatric: Mood and affect normal.   Nursing note and vitals reviewed.        Health Maintenance Due   Topic Date Due   ??? Shingrix Vaccine Age 56> (1 of 2) 12/22/1998   ??? FOBT Q 1 YEAR AGE 79-75  12/22/1998   ??? Pneumococcal 65+ years (1 of 2 - PCV13) 12/21/2013         Assessment and orders:     Encounter Diagnoses     ICD-10-CM ICD-9-CM   1. Pre-op evaluation Z01.818 V72.84   2. Essential hypertension, benign I10 401.1   3. Pure hypercholesterolemia E78.00 272.0   4. Atherosclerosis of native coronary artery of native heart without angina pectoris I25.10 414.01   5. OSA on CPAP G47.33 327.23    Z99.89 V46.8   6. Polyneuropathy G62.9 356.9   7. Morbid obesity due to excess calories ([place]) E66.01 278.01     Diagnoses and all orders for this visit:    1. Pre-op evaluation-gastric sleeve surgery planned  -     CBC WITH AUTOMATED DIFF  -     METABOLIC PANEL, COMPREHENSIVE  -     MAGNESIUM  -     PHOSPHORUS  -     LIPID PANEL  -     IRON PROFILE  -     VITAMIN B12  -     VITAMIN D, 25 HYDROXY  -     RBC, FOLATE  -     HEMOGLOBIN A1C WITH EAG  -     AMB POC EKG ROUTINE W/ 12 LEADS, INTER & REP    2. Essential hypertension, benign  -     METABOLIC PANEL, COMPREHENSIVE  -     AMB POC EKG ROUTINE W/ 12 LEADS, INTER & REP    3. Pure hypercholesterolemia  -  METABOLIC PANEL, COMPREHENSIVE  -     LIPID PANEL    4. Atherosclerosis of native coronary artery of native heart without angina pectoris  -     LIPID PANEL  -     AMB POC EKG ROUTINE W/ 12 LEADS, INTER & REP    5. OSA on CPAP    6. Polyneuropathy  -     METABOLIC PANEL, COMPREHENSIVE  -     HEMOGLOBIN A1C WITH EAG    7. Morbid obesity due to excess calories ([place])  -     METABOLIC PANEL, COMPREHENSIVE  -     LIPID PANEL    Recommended he update his pneumonia vaccine prior to surgery.  -     pneumococcal 23-valent (PNEUMOVAX 23) 25 mcg/0.5 mL injection; 0.5  mL by IntraMUSCular route once for 1 dose. To be administered at Pharmacy. Fax confirmation to me at [phone]. Thank You.      Plan of care:  Discussed diagnoses in detail with patient.     Medication risks/benefits/side effects discussed with patient.     All of the patient's questions were addressed. The patient understands and agrees with our plan of care.    The patient knows to call back if they are unsure of or forget any changes we discussed today or if the symptoms change.     The patient received an After-Visit Summary which contains VS, orders, medication list and allergy list. This can be used as a "mini-medical record" should they have to seek medical care while out of town.    Patient Care Team:  [name], MD as PCP - General (Family Practice)  Cammy Copa, MD (Cardiology)  Verdene Lennert, MD (Orthopedic Surgery)  Beola Cord., MD (Orthopedic Surgery)  [name], Patrick E, DPM (Podiatry)  Valetta Close, DO as Hospitalist (Infectious Diseases)  Rinaldo Cloud, MD (Orthopedic Surgery)    Follow-up and Dispositions    ?? Return in about 3 months (around 04/06/2018).         Future Appointments   Date Time Provider [place]   12/06/2018 10:00 AM Cammy Copa, MD [place]       Signed By: [name], MD     January 04, 2018      ATTENTION:   This medical record was transcribed using an electronic medical records/speech recognition system.  Although proofread, it may and can contain electronic, spelling and other errors.  Corrections may be executed at a later time.  Please feel free to contact me for any clarifications as needed.

## 2018-01-04 NOTE — Progress Notes (Signed)
[facility]  [address]  [place], [place]  [id number]           Progress Note    Patient: Elijah [name] MRN: [id number]  SSN: [id number]    Date of Birth: Oct 10, 1950  Age: 67 y.o.  Sex: male        Chief Complaint   Patient presents with   ??? Pre-op Exam         Subjective:     Encounter Diagnoses   Name Primary?   ??? Pre-op evaluation: He has to reschedule his gastric sleeve operation for December.  He is already been cleared by cardiology to have this done.  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  This was confirmed by echocardiogram in 2018.  Since that time he has had additional testing in the [facility] system which Dr. Daniel Nones has also reviewed.     Yes   ??? Essential hypertension, benign:  BP Readings from Last 3 Encounters:   01/04/18 132/73   12/09/17 109/59   11/23/17 110/60     The patient reports:  taking medications as instructed, no medication side effects noted, no TIA's, no chest pain on exertion, no dyspnea on exertion, no swelling of ankles.    Key CAD CHF Meds             lisinopril (PRINIVIL, ZESTRIL) 20 mg tablet (Taking) TAKE 1 TABLET BY MOUTH DAILY    amLODIPine (NORVASC) 5 mg tablet (Taking) TAKE ONE TABLET BY MOUTH EVERY DAY    nebivolol (BYSTOLIC) 10 mg tablet (Taking) TAKE 1 TABLET BY MOUTH EVERY DAY    atorvastatin (LIPITOR) 40 mg tablet (Taking) Take 80 mg by mouth nightly.    aspirin (ASPIRIN) 325 mg tablet (Taking) Take 1 Tab by mouth daily.    DOCOSAHEXANOIC ACID/EPA (FISH OIL PO) (Taking) Take 1 Cap by mouth daily.           Lab Results   Component Value Date/Time    Sodium 143 09/08/2017 03:38 PM    Potassium 4.5 09/08/2017 03:38 PM    Chloride 110 (H) 09/08/2017 03:38 PM    CO2 21 09/08/2017 03:38 PM    Anion gap 9 09/09/2016 12:16 AM    Glucose 95 09/08/2017 03:38 PM    BUN 20 09/08/2017 03:38 PM    Creatinine 1.08 09/08/2017 03:38 PM    BUN/Creatinine ratio 19 09/08/2017 03:38 PM    GFR est AA 81 09/08/2017 03:38  PM    GFR est non-AA 70 09/08/2017 03:38 PM    Calcium 9.3 09/08/2017 03:38 PM    Bilirubin, total 0.6 09/08/2017 03:38 PM    AST (SGOT) 20 09/08/2017 03:38 PM    Alk. phosphatase 71 09/08/2017 03:38 PM    Protein, total 6.7 09/08/2017 03:38 PM    Albumin 3.9 09/08/2017 03:38 PM    Globulin 4.1 (H) 09/09/2016 12:16 AM    A-G Ratio 1.4 09/08/2017 03:38 PM    ALT (SGPT) 20 09/08/2017 03:38 PM     Low salt diet?yes  Aerobic exercise? no  Our goal is to normalize the blood pressure to decrease the risks of strokes and heart attacks. The patient is in agreement with the plan.          ??? Pure hypercholesterolemia:  Cardiovascular risks for him are: LDL goal is under 80  existing CAD  hypertension  hyperlipidemia.   Key Antihyperlipidemia Meds  atorvastatin (LIPITOR) 40 mg tablet (Taking) Take 80 mg by mouth nightly.    DOCOSAHEXANOIC ACID/EPA (FISH OIL PO) (Taking) Take 1 Cap by mouth daily.        Lab Results   Component Value Date/Time    Cholesterol, total 169 11/12/2016 05:01 PM    HDL Cholesterol 44 11/12/2016 05:01 PM    LDL, calculated 51 11/12/2016 05:01 PM    Triglyceride 369 (H) 11/12/2016 05:01 PM     Lab Results   Component Value Date/Time    ALT (SGPT) 20 09/08/2017 03:38 PM    AST (SGOT) 20 09/08/2017 03:38 PM    Alk. phosphatase 71 09/08/2017 03:38 PM    Bilirubin, direct 0.08 09/20/2016 04:30 PM    Bilirubin, total 0.6 09/08/2017 03:38 PM      Myalgias: No   Fatigue: No   Other side effects: no  Wt Readings from Last 3 Encounters:   01/04/18 (!) 356 lb (161.5 kg)   12/09/17 (!) 357 lb (161.9 kg)   11/23/17 (!) 354 lb (160.6 kg)     The patient is aware of our goal to reduce or eliminate the long term problems (such as strokes and heart attacks) related to poorly controlled hyperlipidemia.          ??? Atherosclerosis of native coronary artery of native heart without angina pectoris:  No chest pain, DOE or SOB. No PND or orthopnea.          ??? OSA on CPAP:  Controlled on CPAP. 100% compliant. Feels  rested in AM.        ??? 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  He is over that.        ??? Morbid obesity due to excess calories [facility]): He has failed all attempts at dieting and is preparing to have a gastric sleeve operation.  He has been cleared by cardiology.  He recently had a knee injury and will need a right knee replacement after he loses weight.          Current and past medical information:    Current Medications after this visit::     Current Outpatient Medications   Medication Sig   ??? pneumococcal 23-valent (PNEUMOVAX 23) 25 mcg/0.5 mL injection 0.5 mL by IntraMUSCular route once for 1 dose. To be administered at Pharmacy. Fax confirmation to me at [phone]. Thank You.   ??? lisinopril (PRINIVIL, ZESTRIL) 20 mg tablet TAKE 1 TABLET BY MOUTH DAILY   ??? amLODIPine (NORVASC) 5 mg tablet TAKE ONE TABLET BY MOUTH EVERY DAY   ??? pregabalin (LYRICA) 300 mg capsule Take 1 Cap by mouth two (2) times a day. Max Daily Amount: 600 mg. 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 cyanocobalamin (VITAMIN B12) 500 mcg tablet Take 1 Tab by mouth daily.   ??? cholecalciferol (VITAMIN D3) 5,000 unit tab tablet Take 1 Tab by mouth daily.   ??? nebivolol (BYSTOLIC) 10 mg tablet TAKE 1 TABLET BY MOUTH EVERY DAY   ??? DULoxetine (CYMBALTA) 60 mg capsule Take 1 Cap by mouth daily.   ??? atorvastatin (LIPITOR) 40 mg tablet Take 80 mg by mouth nightly.   ??? co-enzyme Q-10 (CO Q-10) 100 mg capsule Take 100 mg by mouth nightly.   ??? Flaxseed Oil oil Take 1 Cap by mouth daily.   ??? cpap machine kit by  Does Not Apply route.   ??? aspirin (ASPIRIN) 325 mg tablet Take 1 Tab by mouth daily. (Patient taking differently: Take 81 mg by mouth daily.)   ??? DOCOSAHEXANOIC ACID/EPA (FISH OIL PO) Take 1 Cap by mouth daily.     No current facility-administered medications for this visit.         Patient Active Problem List    Diagnosis Date Noted   ??? Polyneuropathy 05/28/2016     Priority: 1 - One   ??? Morbid obesity due to excess calories ([place]) 04/24/2015     Priority: 1 - One   ??? Essential hypertension, benign 02/21/2015     Priority: 1 - One   ??? HLD (hyperlipidemia) 02/21/2015     Priority: 1 - One   ??? Coronary atherosclerosis of native coronary artery 02/21/2015     Priority: 1 - One   ??? OSA on CPAP 02/21/2015     Priority: 1 - One   ??? Pure hypercholesterolemia 08/23/2013     Priority: 1 - One   ??? 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 Chronic fatigue 09/08/2017   ??? Tubular adenoma of colon 09/23/2015   ??? Benign prostatic hyperplasia 12/29/2006       Past Medical History:   Diagnosis Date   ??? BPH (benign prostatic hyperplasia)    ??? CAD (coronary artery disease)     CABG x 5 2011- nuke with fixed basal inferior defect [facility]   ??? History of vascular access device 09/08/2016    [facility] VAT, 5 FR double, R brachial, 48 cm with 1 cm out, LTA   ??? HLD (hyperlipidemia)    ??? HTN (hypertension)    ??? Morbid obesity due to excess calories ([place]) 04/24/2015   ??? OSA (obstructive sleep apnea)    ??? Polyneuropathy    ??? Tubular adenoma of colon     x2 on colonoscopy 09/18/15       Allergies   Allergen Reactions   ??? Metoprolol Other (comments)     Patient states, made me feel horrible.       Past Surgical History:   Procedure Laterality Date   ??? HX HIP REPLACEMENT Bilateral        Social History     Socioeconomic History   ??? Marital status: MARRIED     Spouse name: Not on file   ??? Number of children: Not on file   ??? Years of education: Not on file   ??? Highest education level: Not on file   Tobacco Use   ??? Smoking status: Former Smoker     Types: Cigarettes   ??? Smokeless tobacco: Never Used   Substance and Sexual Activity   ??? Alcohol use: No     Comment: recently has stopped   ??? Drug use: No       Review of Systems   Constitutional: Negative.  Negative for chills, fever,  malaise/fatigue and weight loss.   HENT: Negative.  Negative for hearing loss.    Eyes: Negative.  Negative for blurred vision and double vision.   Respiratory: Negative.  Negative for cough, sputum production and shortness of breath.    Cardiovascular: Positive for leg swelling. Negative for chest pain and palpitations.        Venous stasis edema right leg   Gastrointestinal: Negative.  Negative for abdominal pain, blood in stool, heartburn, nausea and vomiting.   Genitourinary: Negative.  Negative for dysuria, frequency and urgency.   Musculoskeletal: Negative.  Negative for back pain, falls, myalgias and neck pain.   Skin: Negative.  Negative for rash.   Neurological: Negative.  Negative for dizziness, tingling, tremors, weakness and headaches.   Endo/Heme/Allergies: Negative.    Psychiatric/Behavioral: Negative.  Negative for depression.        Objective:     Vitals:    01/04/18 1052   BP: 132/73   Pulse: 60   Resp: 20   Temp: 98.6 ??F (37 ??C)   TempSrc: Oral   SpO2: 96%   Weight: (!) 356 lb (161.5 kg)   Height: '6\' 1"'  (1.854 m)      Body mass index is 46.97 kg/m??.    Physical Exam   Constitutional: He is oriented to person, place, and time and well-developed, well-nourished, and in no distress.   HENT:   Head: Normocephalic and atraumatic.   Mouth/Throat: Oropharynx is clear and moist.   Eyes: Right eye exhibits no discharge. Left eye exhibits no discharge. No scleral icterus.   Neck: No thyromegaly present.   No bruit.   Cardiovascular: Normal rate, regular rhythm and normal heart sounds. Exam reveals no friction rub.   No murmur heard.  Pulmonary/Chest: Effort normal and breath sounds normal. No respiratory distress. He has no wheezes. He has no rales.   Abdominal: Soft. He exhibits no distension. There is no tenderness.   Musculoskeletal: He exhibits edema.   Neurological: He is alert and oriented to person, place, and time.   Skin: No rash noted. No erythema.   Reports that his skin ulceration underneath his  right toes has resolved.   Psychiatric: Mood and affect normal.   Nursing note and vitals reviewed.        Health Maintenance Due   Topic Date Due   ??? Shingrix Vaccine Age 38> (1 of 2) 12/22/1998   ??? FOBT Q 1 YEAR AGE 12-75  12/22/1998   ??? Pneumococcal 65+ years (1 of 2 - PCV13) 12/21/2013         Assessment and orders:     Encounter Diagnoses     ICD-10-CM ICD-9-CM   1. Pre-op evaluation Z01.818 V72.84   2. Essential hypertension, benign I10 401.1   3. Pure hypercholesterolemia E78.00 272.0   4. Atherosclerosis of native coronary artery of native heart without angina pectoris I25.10 414.01   5. OSA on CPAP G47.33 327.23    Z99.89 V46.8   6. Polyneuropathy G62.9 356.9   7. Morbid obesity due to excess calories ([place]) E66.01 278.01     Diagnoses and all orders for this visit:    1. Pre-op evaluation-gastric sleeve surgery planned  -     CBC WITH AUTOMATED DIFF  -     METABOLIC PANEL, COMPREHENSIVE  -     MAGNESIUM  -     PHOSPHORUS  -     LIPID PANEL  -     IRON PROFILE  -     VITAMIN B12  -     VITAMIN D, 25 HYDROXY  -     RBC, FOLATE  -     HEMOGLOBIN A1C WITH EAG  -     AMB POC EKG ROUTINE W/ 12 LEADS, INTER & REP    2. Essential hypertension, benign  -     METABOLIC PANEL, COMPREHENSIVE  -     AMB POC EKG ROUTINE W/ 12 LEADS, INTER & REP    3. Pure hypercholesterolemia  -  METABOLIC PANEL, COMPREHENSIVE  -     LIPID PANEL    4. Atherosclerosis of native coronary artery of native heart without angina pectoris  -     LIPID PANEL  -     AMB POC EKG ROUTINE W/ 12 LEADS, INTER & REP    5. OSA on CPAP    6. Polyneuropathy  -     METABOLIC PANEL, COMPREHENSIVE  -     HEMOGLOBIN A1C WITH EAG    7. Morbid obesity due to excess calories ([place])  -     METABOLIC PANEL, COMPREHENSIVE  -     LIPID PANEL    Recommended he update his pneumonia vaccine prior to surgery.  -     pneumococcal 23-valent (PNEUMOVAX 23) 25 mcg/0.5 mL injection; 0.5 mL by IntraMUSCular route once for 1 dose. To be administered at Pharmacy. Fax  confirmation to me at [phone]. Thank You.      Plan of care:  Discussed diagnoses in detail with patient.     Medication risks/benefits/side effects discussed with patient.     All of the patient's questions were addressed. The patient understands and agrees with our plan of care.    The patient knows to call back if they are unsure of or forget any changes we discussed today or if the symptoms change.     The patient received an After-Visit Summary which contains VS, orders, medication list and allergy list. This can be used as a "mini-medical record" should they have to seek medical care while out of town.    Patient Care Team:  [name], MD as PCP - General (Family Practice)  Cammy Copa, MD (Cardiology)  Verdene Lennert, MD (Orthopedic Surgery)  Beola Cord., MD (Orthopedic Surgery)  [name], Patrick E, DPM (Podiatry)  Valetta Close, DO as Hospitalist (Infectious Diseases)  Rinaldo Cloud, MD (Orthopedic Surgery)    Follow-up and Dispositions    ?? Return in about 3 months (around 04/06/2018).         Future Appointments   Date Time Provider [place]   12/06/2018 10:00 AM Cammy Copa, MD [place]       Signed By: [name], MD     January 04, 2018      ATTENTION:   This medical record was transcribed using an electronic medical records/speech recognition system.  Although proofread, it may and can contain electronic, spelling and other errors.  Corrections may be executed at a later time.  Please feel free to contact me for any clarifications as needed.

## 2018-01-11 NOTE — Telephone Encounter (Signed)
Pt requesting to have form completed to get a handicap parking sticker.

## 2018-01-12 NOTE — Telephone Encounter (Signed)
Patient called and advised his DMV form ready for pick up. He verbalized understanding.

## 2018-01-27 MED ORDER — NEBIVOLOL 10 MG TAB
10 mg | ORAL_TABLET | ORAL | 1 refills | Status: DC
Start: 2018-01-27 — End: 2018-03-07

## 2018-01-27 NOTE — Telephone Encounter (Signed)
Patient called.   He also needs [place] to call [facility] and let them know it is ok for him to get his Lyrica early.  Not due until next week and he is going out of town.  Needs to pick it up today.

## 2018-02-03 NOTE — Telephone Encounter (Signed)
Returned call to patient. Advised him that Dr. Mliss Sax is out of office and once he returns I will send the clearance letter to Dr. Nuala Alpha.  He verbalized understanding.

## 2018-02-03 NOTE — Telephone Encounter (Signed)
Caller's first/last name:  Aziah Brostrom    Reason for call:  New letter    Does caller want a return call?  PRN    Best contact number:  [phone]    Further clarification of call:      Patient needs to have Dr. Mliss Sax state he is cleared to have surgery.   He is to have it done in two weeks.  The surgery was delayed and he needs something new stating he is cleared for surgery.  Please send it to [place]  [facility]  Dr. Nuala Alpha  [phone] (phone number) and [phone] (fax number)    Patient can be reached at [phone]

## 2018-02-06 NOTE — Telephone Encounter (Signed)
Attempted to call. No answer. Message left.  Detailed message left. Patient needs to have his labs completed prior to surgery clearance.

## 2018-02-09 NOTE — Telephone Encounter (Signed)
Attempted to call. No answer. Message left.

## 2018-02-09 NOTE — Telephone Encounter (Signed)
Natalie from Dr. Marylynn PearsonFitzer's office called.  The patient has now had the labs done.  Can they get the letter clearing him for surgery?  Fax number is [phone] and phone number is [phone].

## 2018-02-09 NOTE — Telephone Encounter (Signed)
Letter faxed today.

## 2018-02-09 NOTE — Telephone Encounter (Signed)
Pt called the back line requesting to speak w/ nurse. Pt would not state the reason why he was calling.

## 2018-02-10 LAB — VITAMIN D 25 HYDROXY: Vit D, 25-Hydroxy: 31 ng/mL (ref 30.0–100.0)

## 2018-02-10 LAB — CBC WITH AUTO DIFFERENTIAL
Basophils %: 2 %
Basophils Absolute: 0.1 10*3/uL (ref 0.0–0.2)
Eosinophils %: 4 %
Eosinophils Absolute: 0.2 10*3/uL (ref 0.0–0.4)
Granulocyte Absolute Count: 0 10*3/uL (ref 0.0–0.1)
Hematocrit: 42.8 % (ref 37.5–51.0)
Hemoglobin: 14.9 g/dL (ref 13.0–17.7)
Immature Granulocytes: 0 %
Lymphocytes %: 24 %
Lymphocytes Absolute: 1.5 10*3/uL (ref 0.7–3.1)
MCH: 31.7 pg (ref 26.6–33.0)
MCHC: 34.8 g/dL (ref 31.5–35.7)
MCV: 91 fL (ref 79–97)
Monocytes %: 13 %
Monocytes Absolute: 0.8 10*3/uL (ref 0.1–0.9)
Neutrophils %: 57 %
Neutrophils Absolute: 3.5 10*3/uL (ref 1.4–7.0)
Platelets: 273 10*3/uL (ref 150–450)
RBC: 4.7 x10E6/uL (ref 4.14–5.80)
RDW: 13 % (ref 12.3–15.4)
WBC: 6 10*3/uL (ref 3.4–10.8)

## 2018-02-10 LAB — LIPID PANEL
Cholesterol, Total: 143 mg/dL (ref 100–199)
Cholesterol, total: 143 mg/dL (ref 100–199)
HDL Cholesterol: 48 mg/dL (ref >39)
HDL: 48 mg/dL (ref >39)
LDL Calculated: 70 mg/dL (ref 0–99)
LDL, calculated: 70 mg/dL (ref 0–99)
Triglyceride: 125 mg/dL (ref 0–149)
Triglycerides: 125 mg/dL (ref 0–149)
VLDL Cholesterol Calculated: 25 mg/dL (ref 5–40)
VLDL, calculated: 25 mg/dL (ref 5–40)

## 2018-02-10 LAB — HEMOGLOBIN A1C W/EAG
Hemoglobin A1C: 5.2 % (ref 4.8–5.6)
eAG: 103 mg/dL

## 2018-02-10 LAB — MAGNESIUM
Magnesium: 2.3 mg/dL (ref 1.6–2.3)
Magnesium: 2.3 mg/dL (ref 1.6–2.3)

## 2018-02-10 LAB — PHOSPHORUS
Phosphorus: 3.8 mg/dL (ref 2.5–4.5)
Phosphorus: 3.8 mg/dL (ref 2.5–4.5)

## 2018-02-10 LAB — COMPREHENSIVE METABOLIC PANEL
ALT: 19 IU/L (ref 0–44)
AST: 16 IU/L (ref 0–40)
Albumin/Globulin Ratio: 1.4 NA (ref 1.2–2.2)
Albumin: 4 g/dL (ref 3.6–4.8)
Alkaline Phosphatase: 63 IU/L (ref 39–117)
BUN: 16 mg/dL (ref 8–27)
Bun/Cre Ratio: 14 NA (ref 10–24)
CO2: 22 mmol/L (ref 20–29)
Calcium: 9.1 mg/dL (ref 8.6–10.2)
Chloride: 107 mmol/L — ABNORMAL HIGH (ref 96–106)
Creatinine: 1.17 mg/dL (ref 0.76–1.27)
EGFR IF NonAfrican American: 63 mL/min/{1.73_m2} (ref >59)
GFR African American: 73 mL/min/{1.73_m2} (ref >59)
Globulin, Total: 2.9 g/dL (ref 1.5–4.5)
Glucose: 66 mg/dL (ref 65–99)
Potassium: 4.6 mmol/L (ref 3.5–5.2)
Sodium: 143 mmol/L (ref 134–144)
Total Bilirubin: 0.6 mg/dL (ref 0.0–1.2)
Total Protein: 6.9 g/dL (ref 6.0–8.5)

## 2018-02-10 LAB — IRON AND TIBC
Iron Saturation: 26 % (ref 15–55)
Iron: 74 ug/dL (ref 38–169)
TIBC: 288 ug/dL (ref 250–450)
UIBC: 214 ug/dL (ref 111–343)

## 2018-02-10 LAB — VITAMIN B12
Vitamin B-12: 584 pg/mL (ref 232–1245)
Vitamin B12: 584 pg/mL (ref 232–1245)

## 2018-02-10 LAB — RBC, FOLATE
FOLATE, HEMOLYSATE, 266019: 438.1 ng/mL
FOLATE, RBC, 266022: 1024 ng/mL (ref >498)
FOLATE, RBC: 1024 ng/mL (ref >498)
Folate, Hemolysate: 438.1 ng/mL

## 2018-02-10 LAB — CBC WITH AUTOMATED DIFF
ABS. BASOPHILS: 0.1 10*3/uL (ref 0.0–0.2)
ABS. EOSINOPHILS: 0.2 10*3/uL (ref 0.0–0.4)
ABS. IMM. GRANS.: 0 10*3/uL (ref 0.0–0.1)
ABS. MONOCYTES: 0.8 10*3/uL (ref 0.1–0.9)
ABS. NEUTROPHILS: 3.5 10*3/uL (ref 1.4–7.0)
Abs Lymphocytes: 1.5 10*3/uL (ref 0.7–3.1)
BASOPHILS: 2 %
EOSINOPHILS: 4 %
HCT: 42.8 % (ref 37.5–51.0)
HGB: 14.9 g/dL (ref 13.0–17.7)
IMMATURE GRANULOCYTES: 0 %
Lymphocytes: 24 %
MCH: 31.7 pg (ref 26.6–33.0)
MCHC: 34.8 g/dL (ref 31.5–35.7)
MCV: 91 fL (ref 79–97)
MONOCYTES: 13 %
NEUTROPHILS: 57 %
PLATELET: 273 10*3/uL (ref 150–450)
RBC: 4.7 x10E6/uL (ref 4.14–5.80)
RDW: 13 % (ref 12.3–15.4)
WBC: 6 10*3/uL (ref 3.4–10.8)

## 2018-02-10 LAB — METABOLIC PANEL, COMPREHENSIVE
A-G Ratio: 1.4 (ref 1.2–2.2)
ALT (SGPT): 19 IU/L (ref 0–44)
AST (SGOT): 16 IU/L (ref 0–40)
Albumin: 4 g/dL (ref 3.6–4.8)
Alk. phosphatase: 63 IU/L (ref 39–117)
BUN/Creatinine ratio: 14 (ref 10–24)
BUN: 16 mg/dL (ref 8–27)
Bilirubin, total: 0.6 mg/dL (ref 0.0–1.2)
CO2: 22 mmol/L (ref 20–29)
Calcium: 9.1 mg/dL (ref 8.6–10.2)
Chloride: 107 mmol/L — ABNORMAL HIGH (ref 96–106)
Creatinine: 1.17 mg/dL (ref 0.76–1.27)
GFR est AA: 73 mL/min/{1.73_m2} (ref >59)
GFR est non-AA: 63 mL/min/{1.73_m2} (ref >59)
GLOBULIN, TOTAL: 2.9 g/dL (ref 1.5–4.5)
Glucose: 66 mg/dL (ref 65–99)
Potassium: 4.6 mmol/L (ref 3.5–5.2)
Protein, total: 6.9 g/dL (ref 6.0–8.5)
Sodium: 143 mmol/L (ref 134–144)

## 2018-02-10 LAB — IRON PROFILE
Iron % saturation: 26 % (ref 15–55)
Iron: 74 ug/dL (ref 38–169)
TIBC: 288 ug/dL (ref 250–450)
UIBC: 214 ug/dL (ref 111–343)

## 2018-02-10 LAB — HEMOGLOBIN A1C WITH EAG
Estimated average glucose: 103 mg/dL
Hemoglobin A1c: 5.2 % (ref 4.8–5.6)

## 2018-02-10 LAB — VITAMIN D, 25 HYDROXY: VITAMIN D, 25-HYDROXY: 31 ng/mL (ref 30.0–100.0)

## 2018-03-07 ENCOUNTER — Encounter

## 2018-03-07 MED ORDER — NEBIVOLOL 10 MG TAB
10 mg | ORAL_TABLET | ORAL | 2 refills | Status: DC
Start: 2018-03-07 — End: 2018-03-08

## 2018-03-07 NOTE — Telephone Encounter (Signed)
Pt called stating that he received a message from pharmacy stating that it's too soon for him to get his medication. Pt would like Dr. Mliss SaxSpence to call pharmacy and advise them to fill his medication. I did advise pt that his ins is probably not going to cover the medication until 12/13. Pt states that this has happen before and Dr. Mliss SaxSpence authorized it. Pt is requesting this be done soon so that he can get his medication tomorrow.

## 2018-03-08 MED ORDER — PREGABALIN 300 MG CAP
300 mg | ORAL_CAPSULE | Freq: Two times a day (BID) | ORAL | 1 refills | Status: DC
Start: 2018-03-08 — End: 2018-05-19

## 2018-03-08 NOTE — Telephone Encounter (Signed)
Called and spoke to patient. He states she needs his Lyrica refilled today but pharamcy says he can not get until 03/10/18. Patient denies taking the medication more than prescribed and states he must have misplaced a few pills. He states he took the last pill this morning.   Please advise. He is requesting approval for earlier fill date.

## 2018-03-08 NOTE — Telephone Encounter (Signed)
Caller's first[facility]last name:  [facility]    Reason for call:  Medication problem    Does caller want a return call?  no    Best contact number:  [phone]    Further clarification of call:      [facility] [address] [facility] [place] [place] [facility] [phone]).  [facility] in [facility].  [facility] [address]  [facility].

## 2018-03-08 NOTE — Telephone Encounter (Signed)
Attempted to call. No answer. Message left.

## 2018-03-08 NOTE — Telephone Encounter (Signed)
Verbal order given to pharmacist.

## 2018-03-08 NOTE — Telephone Encounter (Signed)
Pt returned call.

## 2018-03-17 NOTE — Telephone Encounter (Signed)
Patient stated that his bp has been low(94/50). He stated that he recently had bariatric surgery and since his bp is low, the NP advised him to stop amlodipine. Please advise.    Phone #: [phone]  Thanks

## 2018-03-17 NOTE — Telephone Encounter (Signed)
Verified patient with two types of identifiers. Patients reports his BP has been low and he has been feeling weak since having his bariatric surgery. [facility] however they want to check with Dr. Gasper LloydSabharwal. He will keep a BP diary over the next few weeks and he will update us on the numbers.

## 2018-03-17 NOTE — Telephone Encounter (Signed)
Attempted to reach patient by telephone. A message was left for return call.

## 2018-03-17 NOTE — Telephone Encounter (Signed)
Patient is returning your call.     Phone: [phone]

## 2018-03-19 NOTE — Telephone Encounter (Signed)
Yes stop Norvasc is reasonable

## 2018-03-20 NOTE — Telephone Encounter (Signed)
Verified patient with two types of identifiers. Let patient know Dr. Myles RosenthalSabahrwal is ok with stopping Norvasc. Patient will update me with his BP numbers in the next few weeks. Patient also reports he needs to have a meniscus tear repair and wants to know how soon he should wait to have that done from a cardiac stand point.

## 2018-03-23 NOTE — Telephone Encounter (Signed)
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 Gated SPECT images reveals normal myocardial thickening and wall motion. The left ventricular ejection fraction was calculated to be 53 %.   Future Appointments   Date Time Provider Department Center   12/06/2018 10:00 AM Mathews RobinsonsSabharwal, Talayeh Bruinsma, MD [facility] SCHED

## 2018-03-28 NOTE — Telephone Encounter (Signed)
Attempted to reach patient by telephone. A message was left for return call.

## 2018-03-31 ENCOUNTER — Encounter: Attending: Family Medicine | Primary: Family Medicine

## 2018-04-05 ENCOUNTER — Ambulatory Visit: Attending: Family Medicine | Primary: Family Medicine

## 2018-04-05 ENCOUNTER — Inpatient Hospital Stay: Admit: 2018-04-05 | Payer: MEDICARE | Primary: Family Medicine

## 2018-04-05 ENCOUNTER — Encounter: Admit: 2018-04-05 | Primary: Family Medicine

## 2018-04-05 ENCOUNTER — Ambulatory Visit: Admit: 2018-04-05 | Payer: MEDICARE | Attending: Family Medicine | Primary: Family Medicine

## 2018-04-05 DIAGNOSIS — I10 Essential (primary) hypertension: Secondary | ICD-10-CM

## 2018-04-05 DIAGNOSIS — L84 Corns and callosities: Secondary | ICD-10-CM

## 2018-04-05 MED ORDER — CEFUROXIME AXETIL 500 MG TAB
500 mg | ORAL_TABLET | Freq: Two times a day (BID) | ORAL | 0 refills | Status: AC
Start: 2018-04-05 — End: 2018-04-15

## 2018-04-05 MED ORDER — DICLOFENAC 1 % TOPICAL GEL
1 % | Freq: Four times a day (QID) | CUTANEOUS | 4 refills | Status: DC
Start: 2018-04-05 — End: 2018-06-02

## 2018-04-05 NOTE — Progress Notes (Signed)
1. Have you been to the ER, urgent care clinic since your last visit?  Hospitalized since your last visit?No    2. Have you seen or consulted any other health care providers outside of the [facility] since your last visit?  Include any pap smears or colon screening. No    Reviewed record in preparation for visit and have necessary documentation  Goals that were addressed and/or need to be completed during or after this appointment include     Health Maintenance Due   Topic Date Due   ??? Shingrix Vaccine Age 50> (1 of 2) 12/22/1998   ??? FOBT Q 1 YEAR AGE 50-75  12/22/1998   ??? Pneumococcal 65+ years (1 of 1 - PPSV23) 12/21/2013       Patient is accompanied by self I have received verbal consent from Elijah [name] to discuss any/all medical information while they are present in the room.

## 2018-04-05 NOTE — Telephone Encounter (Signed)
The fax number to send to Dr. Felecia Shelling is [phone].

## 2018-04-05 NOTE — Telephone Encounter (Signed)
Xray results faxed.

## 2018-04-05 NOTE — Progress Notes (Addendum)
[place]  [address]  [place], [place]  [phone]           Progress Note    Patient: Elijah [name] MRN: [id number]  SSN: [id number]    Date of Birth: [birth year]  Age: 68 y.o.  Sex: male        Chief Complaint   Patient presents with   ??? Sinus Infection         Subjective:     Encounter Diagnoses   Name Primary?   ??? Essential hypertension, benign: Blood pressure is normal today.  He has not had any lab work or electrolytes.  BP Readings from Last 3 Encounters:   04/05/18 117/73   01/04/18 132/73   12/09/17 109/59     The patient reports:  taking medications as instructed, no medication side effects noted, no TIA's, no chest pain on exertion, no dyspnea on exertion, no swelling of ankles.    Key CAD CHF Meds             nebivolol (BYSTOLIC) 10 mg tablet (Taking) Take  by mouth daily.    lisinopril (PRINIVIL, ZESTRIL) 20 mg tablet (Taking) TAKE 1 TABLET BY MOUTH DAILY    atorvastatin (LIPITOR) 40 mg tablet (Taking) Take 80 mg by mouth nightly.    DOCOSAHEXANOIC ACID/EPA (FISH OIL PO) (Taking) Take 1 Cap by mouth daily.           Lab Results   Component Value Date/Time    Sodium 143 02/07/2018 10:29 AM    Potassium 4.6 02/07/2018 10:29 AM    Chloride 107 (H) 02/07/2018 10:29 AM    CO2 22 02/07/2018 10:29 AM    Anion gap 9 09/09/2016 12:16 AM    Glucose 66 02/07/2018 10:29 AM    BUN 16 02/07/2018 10:29 AM    Creatinine 1.17 02/07/2018 10:29 AM    BUN/Creatinine ratio 14 02/07/2018 10:29 AM    GFR est AA 73 02/07/2018 10:29 AM    GFR est non-AA 63 02/07/2018 10:29 AM    Calcium 9.1 02/07/2018 10:29 AM    Bilirubin, total 0.6 02/07/2018 10:29 AM    AST (SGOT) 16 02/07/2018 10:29 AM    Alk. phosphatase 63 02/07/2018 10:29 AM    Protein, total 6.9 02/07/2018 10:29 AM    Albumin 4.0 02/07/2018 10:29 AM    Globulin 4.1 (H) 09/09/2016 12:16 AM    A-G Ratio 1.4 02/07/2018 10:29 AM    ALT (SGPT) 19 02/07/2018 10:29 AM     Low salt diet?yes  Aerobic exercise? no   Our goal is to normalize the blood pressure to decrease the risks of strokes and heart attacks. The patient is in agreement with the plan.     Yes   ??? Pure hypercholesterolemia:  Cardiovascular risks for him are: LDL goal is under 100  hypertension  hyperlipidemia.   Key Antihyperlipidemia Meds             atorvastatin (LIPITOR) 40 mg tablet (Taking) Take 80 mg by mouth nightly.    DOCOSAHEXANOIC ACID/EPA (FISH OIL PO) (Taking) Take 1 Cap by mouth daily.        Lab Results   Component Value Date/Time    Cholesterol, total 143 02/07/2018 10:29 AM    HDL Cholesterol 48 02/07/2018 10:29 AM    LDL, calculated 70 02/07/2018 10:29 AM    Triglyceride 125 02/07/2018 10:29 AM     Lab Results   Component Value Date/Time  ALT (SGPT) 19 02/07/2018 10:29 AM    AST (SGOT) 16 02/07/2018 10:29 AM    Alk. phosphatase 63 02/07/2018 10:29 AM    Bilirubin, direct 0.08 09/20/2016 04:30 PM    Bilirubin, total 0.6 02/07/2018 10:29 AM      Myalgias: No   Fatigue: No   Other side effects: no  Wt Readings from Last 3 Encounters:   04/05/18 313 lb 9.6 oz (142.2 kg)   01/04/18 (!) 356 lb (161.5 kg)   12/09/17 (!) 357 lb (161.9 kg)     The patient is aware of our goal to reduce or eliminate the long term problems (such as strokes and heart attacks) related to poorly controlled hyperlipidemia.        ??? Morbid obesity due to excess calories [facility]): He has lost 43 pounds since his last gastric sleeve procedure.  He is upset because he has not lost any weight in 10 days.  He is eating only cottage cheese and tunafish.  He says is total daily caloric intake is in the neighborhood of 500 cal.  Body mass index is 41.37 kg/m??.  Wt Readings from Last 3 Encounters:   04/05/18 313 lb 9.6 oz (142.2 kg)   01/04/18 (!) 356 lb (161.5 kg)   12/09/17 (!) 357 lb (161.9 kg)     Exercise capacity:  I have reviewed/discussed the above normal BMI with the patient.  I have recommended the following interventions: dietary management education,  guidance, and counseling . Marland Kitchen      Recommended diet: Currently prescribed by his bariatric surgeon.          ??? Polyneuropathy:  He has an idiopathic dense polyneuropathy involving both legs.        ??? S/P gastric surgery: He did well without complication after surgery.        ??? B12 deficiency: Because of his gastric surgery he will need to be monitored for B12 deficiency.    Lab Results   Component Value Date/Time    Vitamin B12 584 02/07/2018 10:29 AM    Folate 7.0 06/22/2016 11:22 AM           ??? Vitamin D deficiency:  No sx. Due for testing.  Lab Results   Component Value Date/Time    VITAMIN D, 25-HYDROXY 31.0 02/07/2018 10:29 AM           ??? Callus of foot: He has an ulcerated callus on her left lateral foot.  He is requesting it x-ray of his left lateral foot so the results can be relayed to his current podiatrist who is been on [place].  He splits time between [facility] and [place] and [place].  He said podiatrist was not worried about osteomyelitis but wanted to look for small fracture in 1 of his toes.        ??? Skin ulcer of left foot, limited to breakdown of skin [facility]): See above.            Acute sinusitis: He told the nurse but forgot to tell me that he has had purulent rhinorrhea for 3 weeks and extreme nasal congestion.  No fever no chills and no sweats but this is consistent with a low-grade sinusitis.  Will be placed on antibiotic and need x-rays of her sinuses if this does not resolve.        Current and past medical information:    Current Medications after this visit::     Current Outpatient Medications  Medication Sig   ??? nebivolol (BYSTOLIC) 10 mg tablet Take  by mouth daily.   ??? diclofenac (VOLTAREN) 1 % gel Apply 2 g to affected area four (4) times daily. Apply to right knee.   ??? pregabalin (LYRICA) 300 mg capsule Take 1 Cap by mouth two (2) times a day. Max Daily Amount: 600 mg. 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 lisinopril (PRINIVIL, ZESTRIL) 20 mg tablet TAKE 1 TABLET BY MOUTH DAILY   ??? cyanocobalamin (VITAMIN B12) 500 mcg tablet Take 1 Tab by mouth daily.   ??? cholecalciferol (VITAMIN D3) 5,000 unit tab tablet Take 1 Tab by mouth daily.   ??? DULoxetine (CYMBALTA) 60 mg capsule Take 1 Cap by mouth daily.   ??? atorvastatin (LIPITOR) 40 mg tablet Take 80 mg by mouth nightly.   ??? co-enzyme Q-10 (CO Q-10) 100 mg capsule Take 100 mg by mouth nightly.   ??? Flaxseed Oil oil Take 1 Cap by mouth daily.   ??? cpap machine kit by Does Not Apply route.   ??? DOCOSAHEXANOIC ACID/EPA (FISH OIL PO) Take 1 Cap by mouth daily.   ??? pantoprazole (PROTONIX) 40 mg tablet TAKE 1 TABLET BY MOUTH DAILY AFTER DISCHARGED FROM HOSPITAL     No current facility-administered medications for this visit.        Patient Active Problem List    Diagnosis Date Noted   ??? Polyneuropathy 05/28/2016     Priority: 1 - One   ??? Morbid obesity due to excess calories ([place]) 04/24/2015     Priority: 1 - One   ??? Essential hypertension, benign 02/21/2015     Priority: 1 - One   ??? HLD (hyperlipidemia) 02/21/2015     Priority: 1 - One   ??? Coronary atherosclerosis of native coronary artery 02/21/2015     Priority: 1 - One   ??? OSA on CPAP 02/21/2015     Priority: 1 - One   ??? Pure hypercholesterolemia 08/23/2013     Priority: 1 - One   ??? 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 Chronic fatigue 09/08/2017   ??? Tubular adenoma of colon 09/23/2015   ??? Benign prostatic hyperplasia 12/29/2006       Past Medical History:   Diagnosis Date   ??? BPH (benign prostatic hyperplasia)    ??? CAD (coronary artery disease)     CABG x 5 2011- [place] with [place] [facility] [place] [facility]   ??? History of vascular access device 09/08/2016    [facility] VAT, 5 FR double, R brachial, 48 cm with 1 cm out, LTA   ??? HLD (hyperlipidemia)    ??? HTN (hypertension)    ??? Morbid obesity due to excess calories ([place]) 04/24/2015   ??? OSA (obstructive sleep apnea)     ??? Polyneuropathy    ??? Tubular adenoma of colon     x2 on colonoscopy 09/18/15       Allergies   Allergen Reactions   ??? Metoprolol Other (comments)     Patient states, made me feel horrible.       Past Surgical History:   Procedure Laterality Date   ??? HX HIP REPLACEMENT Bilateral        Social History     Socioeconomic History   ??? Marital status: MARRIED     Spouse name: Not on file   ??? Number of children: Not on  file   ??? Years of education: Not on file   ??? Highest education level: Not on file   Tobacco Use   ??? Smoking status: Former Smoker     Types: Cigarettes   ??? Smokeless tobacco: Never Used   Substance and Sexual Activity   ??? Alcohol use: No     Comment: recently has stopped   ??? Drug use: No       Review of Systems   Constitutional: Negative.  Negative for chills, fever, malaise/fatigue and weight loss.   HENT: Positive for congestion. Negative for hearing loss.         Yellow-green nasal discharge.   Eyes: Negative.  Negative for blurred vision and double vision.   Respiratory: Negative.  Negative for cough, sputum production and shortness of breath.    Cardiovascular: Negative.  Negative for chest pain and palpitations.   Gastrointestinal: Negative.  Negative for abdominal pain, blood in stool, heartburn, nausea and vomiting.   Genitourinary: Negative.    Musculoskeletal: Positive for joint pain. Negative for falls.        His right knee is really hurting him.  He says he cannot do adequate walking to facilitate weight loss because of the right knee pain.  The orthopedic surgeon has refused to do his surgery until he loses more weight.  I told him that the safest thing for him to do would be pool exercises.  He says he has access to all [employer] pool but will not be supervised exercise.   Skin: Negative.  Negative for rash.   Neurological: Negative.  Negative for dizziness, tingling, tremors, weakness and headaches.   Endo/Heme/Allergies: Negative.     Psychiatric/Behavioral: Negative.  Negative for depression.        Objective:     Vitals:    04/05/18 1045   BP: 117/73   Pulse: 62   Resp: 20   Temp: 98 ??F (36.7 ??C)   TempSrc: Oral   SpO2: 93%   Weight: 313 lb 9.6 oz (142.2 kg)   Height: '6\' 1"'  (1.854 m)      Body mass index is 41.37 kg/m??.    Physical Exam  Vitals signs and nursing note reviewed.   Constitutional:       General: He is not in acute distress.     Appearance: Normal appearance. He is well-developed. He is not toxic-appearing.   HENT:      Head: Normocephalic and atraumatic.      Nose: No congestion.      Mouth/Throat:      Pharynx: No oropharyngeal exudate or posterior oropharyngeal erythema.   Eyes:      General: No scleral icterus.        Right eye: No discharge.         Left eye: No discharge.   Neck:      Comments: No bruit.  Cardiovascular:      Rate and Rhythm: Normal rate and regular rhythm.      Heart sounds: Normal heart sounds. No murmur. No friction rub. No gallop.    Pulmonary:      Effort: Pulmonary effort is normal. No respiratory distress.      Breath sounds: Normal breath sounds. No wheezing, rhonchi or rales.   Abdominal:      General: There is no distension.   Musculoskeletal:         General: No swelling.   Skin:     General: Skin is warm.  Coloration: Skin is not jaundiced.      Findings: No erythema or rash.   Neurological:      Mental Status: He is alert.       Health Maintenance Due   Topic Date Due   ??? Shingrix Vaccine Age 35> (1 of 2) 12/22/1998   ??? FOBT Q 1 YEAR AGE 83-75  12/22/1998   ??? Pneumococcal 65+ years (1 of 1 - PPSV23) 12/21/2013         Assessment and orders:     Encounter Diagnoses     ICD-10-CM ICD-9-CM   1. Essential hypertension, benign I10 401.1   2. Pure hypercholesterolemia E78.00 272.0   3. Morbid obesity due to excess calories (HCC) E66.01 278.01   4. Polyneuropathy G62.9 356.9   5. S/P gastric surgery Z98.890 V45.89   6. B12 deficiency E53.8 266.2   7. Vitamin D deficiency E55.9 268.9    8. Callus of foot L84 700   9. Skin ulcer of left foot, limited to breakdown of skin (HCC) L97.521 707.15     Diagnoses and all orders for this visit:    1. Essential hypertension, benign-controlled  -     T4, FREE; Future  -     METABOLIC PANEL, COMPREHENSIVE; Future  -     HEMOGLOBIN; Future  -     LIPID PANEL; Future  -     TSH 3RD GENERATION; Future    2. Pure hypercholesterolemia-retest  -     T4, FREE; Future  -     METABOLIC PANEL, COMPREHENSIVE; Future  -     LIPID PANEL; Future    3. Morbid obesity due to excess calories (HCC)-losing weight.  Surgery appears to been a success.  I explained to him that weight occurs in a stepwise fashion in a linear fashion.    4. 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  -     METABOLIC PANEL, COMPREHENSIVE; Future    5. S/P gastric surgery-doing well recheck labs  -     METABOLIC PANEL, COMPREHENSIVE; Future  -     HEMOGLOBIN; Future  -     LIPID PANEL; Future    6. B12 deficiency-retest  -     VITAMIN B12; Future    7. Vitamin D deficiency-retest  -     VITAMIN D, 25 HYDROXY; Future    8. Callus of foot  -     XR FOOT LT AP/LAT; Future    9. Skin ulcer of left foot, limited to breakdown of skin (HCC)-await final reading on foot x-ray.  -     XR FOOT LT AP/LAT; Future    10.  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  Since the orthopedic surgeon has not approved him to have knee surgery so far and he cannot take oral NSAIDs we will try him on diclofenac gel.  -     diclofenac (VOLTAREN) 1 % gel; Apply 2 g to affected area four (4) times daily. Apply to right knee.    11.  Sinusitis treated with 10 days of Ceftin.        Plan of care:  Discussed diagnoses in detail with patient.     Medication risks/benefits/side effects discussed with patient.     All of the patient's questions were addressed. The patient understands and agrees with our plan of care.     The patient knows to call back if they are unsure of or forget any  changes we discussed today or if the symptoms change.     The patient received an After-Visit Summary which contains VS, orders, medication list and allergy list. This can be used as a "mini-medical record" should they have to seek medical care while out of town.    Patient Care Team:  [name], MD as PCP - General (Family Practice)  Donalda Ewings Neale Burly, MD as PCP - [facility] Empaneled Provider  Cammy Copa, MD (Cardiology)  Verdene Lennert, MD (Orthopedic Surgery)  Beola Cord., MD (Orthopedic Surgery)  [name], Patrick E, DPM (Podiatry)  Valetta Close, DO as Hospitalist (Infectious Diseases)  Rinaldo Cloud, MD (Orthopedic Surgery)    Follow-up and Dispositions    ?? Return in about 2 months (around 06/04/2018).           Signed By: [name], MD     April 05, 2018      ATTENTION:   This medical record was transcribed using an electronic medical records/speech recognition system.  Although proofread, it may and can contain electronic, spelling and other errors.  Corrections may be executed at a later time.  Please feel free to contact me for any clarifications as needed.

## 2018-04-05 NOTE — Addendum Note (Signed)
Addended by: Jacelyn Grip on: 04/05/2018 01:07 PM     Modules accepted: Orders

## 2018-04-05 NOTE — Progress Notes (Signed)
Please call patient with a x-ray reading left foot.    No fracture or osteomyelitis. No radiodense foreign body.    Find out where he wants his report faxed.  He said his podiatrist in [place] needs it.    Thank you,  Dr. Tajanay Hurley

## 2018-04-05 NOTE — Patient Instructions (Addendum)
Recommend pool exercise.

## 2018-04-05 NOTE — Telephone Encounter (Addendum)
Spoke with patient and informed him of the following per Dr. Mliss Sax:    "Please call patient with a x-ray reading left foot.  No fracture or osteomyelitis. No radiodense foreign body.  Find out where he wants his report faxed.  He said his podiatrist in [place] needs it."    Patient verbalized understanding and is going to call back with the fax number to podiatry.

## 2018-04-05 NOTE — Progress Notes (Signed)
[facility]  [address]  [place], [place]  [phone]           Progress Note    Patient: Elijah [name] MRN: [id number]  SSN: [id number]    Date of Birth: [birth year]  Age: 68 y.o.  Sex: male        Chief Complaint   Patient presents with   ??? Sinus Infection         Subjective:     Encounter Diagnoses   Name Primary?   ??? Essential hypertension, benign: Blood pressure is normal today.  He has not had any lab work or electrolytes.  BP Readings from Last 3 Encounters:   04/05/18 117/73   01/04/18 132/73   12/09/17 109/59     The patient reports:  taking medications as instructed, no medication side effects noted, no TIA's, no chest pain on exertion, no dyspnea on exertion, no swelling of ankles.    Key CAD CHF Meds             nebivolol (BYSTOLIC) 10 mg tablet (Taking) Take  by mouth daily.    lisinopril (PRINIVIL, ZESTRIL) 20 mg tablet (Taking) TAKE 1 TABLET BY MOUTH DAILY    atorvastatin (LIPITOR) 40 mg tablet (Taking) Take 80 mg by mouth nightly.    DOCOSAHEXANOIC ACID/EPA (FISH OIL PO) (Taking) Take 1 Cap by mouth daily.           Lab Results   Component Value Date/Time    Sodium 143 02/07/2018 10:29 AM    Potassium 4.6 02/07/2018 10:29 AM    Chloride 107 (H) 02/07/2018 10:29 AM    CO2 22 02/07/2018 10:29 AM    Anion gap 9 09/09/2016 12:16 AM    Glucose 66 02/07/2018 10:29 AM    BUN 16 02/07/2018 10:29 AM    Creatinine 1.17 02/07/2018 10:29 AM    BUN/Creatinine ratio 14 02/07/2018 10:29 AM    GFR est AA 73 02/07/2018 10:29 AM    GFR est non-AA 63 02/07/2018 10:29 AM    Calcium 9.1 02/07/2018 10:29 AM    Bilirubin, total 0.6 02/07/2018 10:29 AM    AST (SGOT) 16 02/07/2018 10:29 AM    Alk. phosphatase 63 02/07/2018 10:29 AM    Protein, total 6.9 02/07/2018 10:29 AM    Albumin 4.0 02/07/2018 10:29 AM    Globulin 4.1 (H) 09/09/2016 12:16 AM    A-G Ratio 1.4 02/07/2018 10:29 AM    ALT (SGPT) 19 02/07/2018 10:29 AM     Low salt diet?yes  Aerobic exercise? no  Our  goal is to normalize the blood pressure to decrease the risks of strokes and heart attacks. The patient is in agreement with the plan.     Yes   ??? Pure hypercholesterolemia:  Cardiovascular risks for him are: LDL goal is under 100  hypertension  hyperlipidemia.   Key Antihyperlipidemia Meds             atorvastatin (LIPITOR) 40 mg tablet (Taking) Take 80 mg by mouth nightly.    DOCOSAHEXANOIC ACID/EPA (FISH OIL PO) (Taking) Take 1 Cap by mouth daily.        Lab Results   Component Value Date/Time    Cholesterol, total 143 02/07/2018 10:29 AM    HDL Cholesterol 48 02/07/2018 10:29 AM    LDL, calculated 70 02/07/2018 10:29 AM    Triglyceride 125 02/07/2018 10:29 AM     Lab Results   Component Value Date/Time  ALT (SGPT) 19 02/07/2018 10:29 AM    AST (SGOT) 16 02/07/2018 10:29 AM    Alk. phosphatase 63 02/07/2018 10:29 AM    Bilirubin, direct 0.08 09/20/2016 04:30 PM    Bilirubin, total 0.6 02/07/2018 10:29 AM      Myalgias: No   Fatigue: No   Other side effects: no  Wt Readings from Last 3 Encounters:   04/05/18 313 lb 9.6 oz (142.2 kg)   01/04/18 (!) 356 lb (161.5 kg)   12/09/17 (!) 357 lb (161.9 kg)     The patient is aware of our goal to reduce or eliminate the long term problems (such as strokes and heart attacks) related to poorly controlled hyperlipidemia.        ??? Morbid obesity due to excess calories [facility]): He has lost 43 pounds since his last gastric sleeve procedure.  He is upset because he has not lost any weight in 10 days.  He is eating only cottage cheese and tunafish.  He says is total daily caloric intake is in the neighborhood of 500 cal.  Body mass index is 41.37 kg/m??.  Wt Readings from Last 3 Encounters:   04/05/18 313 lb 9.6 oz (142.2 kg)   01/04/18 (!) 356 lb (161.5 kg)   12/09/17 (!) 357 lb (161.9 kg)     Exercise capacity:  I have reviewed/discussed the above normal BMI with the patient.  I have recommended the following interventions: dietary management education, guidance, and counseling .  Marland Kitchen      Recommended diet: Currently prescribed by his bariatric surgeon.          ??? Polyneuropathy:  He has an idiopathic dense polyneuropathy involving both legs.        ??? S/P gastric surgery: He did well without complication after surgery.        ??? B12 deficiency: Because of his gastric surgery he will need to be monitored for B12 deficiency.    Lab Results   Component Value Date/Time    Vitamin B12 584 02/07/2018 10:29 AM    Folate 7.0 06/22/2016 11:22 AM           ??? Vitamin D deficiency:  No sx. Due for testing.  Lab Results   Component Value Date/Time    VITAMIN D, 25-HYDROXY 31.0 02/07/2018 10:29 AM           ??? Callus of foot: He has an ulcerated callus on her left lateral foot.  He is requesting it x-ray of his left lateral foot so the results can be relayed to his current podiatrist who is been on [place].  He splits time between [facility] and here and [place].  He said podiatrist was not worried about osteomyelitis but wanted to look for small fracture in 1 of his toes.        ??? Skin ulcer of left foot, limited to breakdown of skin [facility]): See above.            Acute sinusitis: He told the nurse but forgot to tell me that he has had purulent rhinorrhea for 3 weeks and extreme nasal congestion.  No fever no chills and no sweats but this is consistent with a low-grade sinusitis.  Will be placed on antibiotic and need x-rays of her sinuses if this does not resolve.        Current and past medical information:    Current Medications after this visit::     Current Outpatient Medications  Medication Sig   ??? nebivolol (BYSTOLIC) 10 mg tablet Take  by mouth daily.   ??? diclofenac (VOLTAREN) 1 % gel Apply 2 g to affected area four (4) times daily. Apply to right knee.   ??? pregabalin (LYRICA) 300 mg capsule Take 1 Cap by mouth two (2) times a day. Max Daily Amount: 600 mg. 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 lisinopril (PRINIVIL, ZESTRIL) 20 mg tablet TAKE 1 TABLET  BY MOUTH DAILY   ??? cyanocobalamin (VITAMIN B12) 500 mcg tablet Take 1 Tab by mouth daily.   ??? cholecalciferol (VITAMIN D3) 5,000 unit tab tablet Take 1 Tab by mouth daily.   ??? DULoxetine (CYMBALTA) 60 mg capsule Take 1 Cap by mouth daily.   ??? atorvastatin (LIPITOR) 40 mg tablet Take 80 mg by mouth nightly.   ??? co-enzyme Q-10 (CO Q-10) 100 mg capsule Take 100 mg by mouth nightly.   ??? Flaxseed Oil oil Take 1 Cap by mouth daily.   ??? cpap machine kit by Does Not Apply route.   ??? DOCOSAHEXANOIC ACID/EPA (FISH OIL PO) Take 1 Cap by mouth daily.   ??? pantoprazole (PROTONIX) 40 mg tablet TAKE 1 TABLET BY MOUTH DAILY AFTER DISCHARGED FROM HOSPITAL     No current facility-administered medications for this visit.        Patient Active Problem List    Diagnosis Date Noted   ??? Polyneuropathy 05/28/2016     Priority: 1 - One   ??? Morbid obesity due to excess calories ([facility]) 04/24/2015     Priority: 1 - One   ??? Essential hypertension, benign 02/21/2015     Priority: 1 - One   ??? HLD (hyperlipidemia) 02/21/2015     Priority: 1 - One   ??? Coronary atherosclerosis of native coronary artery 02/21/2015     Priority: 1 - One   ??? OSA on CPAP 02/21/2015     Priority: 1 - One   ??? Pure hypercholesterolemia 08/23/2013     Priority: 1 - One   ??? 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 Chronic fatigue 09/08/2017   ??? Tubular adenoma of colon 09/23/2015   ??? Benign prostatic hyperplasia 12/29/2006       Past Medical History:   Diagnosis Date   ??? BPH (benign prostatic hyperplasia)    ??? CAD (coronary artery disease)     CABG x 5 2011[place]   ??? History of vascular access device 09/08/2016    [facility] VAT, 5 FR double, R brachial, 48 cm with 1 cm out, LTA   ??? HLD (hyperlipidemia)    ??? HTN (hypertension)    ??? Morbid obesity due to excess calories ([facility]) 04/24/2015   ??? OSA (obstructive sleep apnea)    ??? Polyneuropathy    ??? Tubular adenoma of colon     x2 on colonoscopy 09/18/15        Allergies   Allergen Reactions   ??? Metoprolol Other (comments)     Patient states, made me feel horrible.       Past Surgical History:   Procedure Laterality Date   ??? HX HIP REPLACEMENT Bilateral        Social History     Socioeconomic History   ??? Marital status: MARRIED     Spouse name: Not on file   ??? Number of children: Not on  file   ??? Years of education: Not on file   ??? Highest education level: Not on file   Tobacco Use   ??? Smoking status: Former Smoker     Types: Cigarettes   ??? Smokeless tobacco: Never Used   Substance and Sexual Activity   ??? Alcohol use: No     Comment: recently has stopped   ??? Drug use: No       Review of Systems   Constitutional: Negative.  Negative for chills, fever, malaise/fatigue and weight loss.   HENT: Positive for congestion. Negative for hearing loss.         Yellow-green nasal discharge.   Eyes: Negative.  Negative for blurred vision and double vision.   Respiratory: Negative.  Negative for cough, sputum production and shortness of breath.    Cardiovascular: Negative.  Negative for chest pain and palpitations.   Gastrointestinal: Negative.  Negative for abdominal pain, blood in stool, heartburn, nausea and vomiting.   Genitourinary: Negative.    Musculoskeletal: Positive for joint pain. Negative for falls.        His right knee is really hurting him.  He says he cannot do adequate walking to facilitate weight loss because of the right knee pain.  The orthopedic surgeon has refused to do his surgery until he loses more weight.  I told him that the safest thing for him to do would be pool exercises.  He says he has access to all [employer] pool but will not be supervised exercise.   Skin: Negative.  Negative for rash.   Neurological: Negative.  Negative for dizziness, tingling, tremors, weakness and headaches.   Endo/Heme/Allergies: Negative.    Psychiatric/Behavioral: Negative.  Negative for depression.        Objective:     Vitals:    04/05/18 1045   BP: 117/73   Pulse: 62    Resp: 20   Temp: 98 ??F (36.7 ??C)   TempSrc: Oral   SpO2: 93%   Weight: 313 lb 9.6 oz (142.2 kg)   Height: '6\' 1"'  (1.854 m)      Body mass index is 41.37 kg/m??.    Physical Exam  Vitals signs and nursing note reviewed.   Constitutional:       General: He is not in acute distress.     Appearance: Normal appearance. He is well-developed. He is not toxic-appearing.   HENT:      Head: Normocephalic and atraumatic.      Nose: No congestion.      Mouth/Throat:      Pharynx: No oropharyngeal exudate or posterior oropharyngeal erythema.   Eyes:      General: No scleral icterus.        Right eye: No discharge.         Left eye: No discharge.   Neck:      Comments: No bruit.  Cardiovascular:      Rate and Rhythm: Normal rate and regular rhythm.      Heart sounds: Normal heart sounds. No murmur. No friction rub. No gallop.    Pulmonary:      Effort: Pulmonary effort is normal. No respiratory distress.      Breath sounds: Normal breath sounds. No wheezing, rhonchi or rales.   Abdominal:      General: There is no distension.   Musculoskeletal:         General: No swelling.   Skin:     General: Skin is warm.  Coloration: Skin is not jaundiced.      Findings: No erythema or rash.   Neurological:      Mental Status: He is alert.       Health Maintenance Due   Topic Date Due   ??? Shingrix Vaccine Age 40> (1 of 2) 12/22/1998   ??? FOBT Q 1 YEAR AGE 30-75  12/22/1998   ??? Pneumococcal 65+ years (1 of 1 - PPSV23) 12/21/2013         Assessment and orders:     Encounter Diagnoses     ICD-10-CM ICD-9-CM   1. Essential hypertension, benign I10 401.1   2. Pure hypercholesterolemia E78.00 272.0   3. Morbid obesity due to excess calories ([facility]CC) E66.01 278.01   4. Polyneuropathy G62.9 356.9   5. S/P gastric surgery Z98.890 V45.89   6. B12 deficiency E53.8 266.2   7. Vitamin D deficiency E55.9 268.9   8. Callus of foot L84 700   9. Skin ulcer of left foot, limited to breakdown of skin ([facility]CC) L97.521 707.15     Diagnoses and all orders for this  visit:    1. Essential hypertension, benign-controlled  -     T4, FREE; Future  -     METABOLIC PANEL, COMPREHENSIVE; Future  -     HEMOGLOBIN; Future  -     LIPID PANEL; Future  -     TSH 3RD GENERATION; Future    2. Pure hypercholesterolemia-retest  -     T4, FREE; Future  -     METABOLIC PANEL, COMPREHENSIVE; Future  -     LIPID PANEL; Future    3. Morbid obesity due to excess calories ([facility])-losing weight.  Surgery appears to been a success.  I explained to him that weight occurs in a stepwise fashion in a linear fashion.    4. 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  -     METABOLIC PANEL, COMPREHENSIVE; Future    5. S/P gastric surgery-doing well recheck labs  -     METABOLIC PANEL, COMPREHENSIVE; Future  -     HEMOGLOBIN; Future  -     LIPID PANEL; Future    6. B12 deficiency-retest  -     VITAMIN B12; Future    7. Vitamin D deficiency-retest  -     VITAMIN D, 25 HYDROXY; Future    8. Callus of foot  -     XR FOOT LT AP/LAT; Future    9. Skin ulcer of left foot, limited to breakdown of skin (HCC)-await final reading on foot x-ray.  -     XR FOOT LT AP/LAT; Future    10.  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  Since the orthopedic surgeon has not approved him to have knee surgery so far and he cannot take oral NSAIDs we will try him on diclofenac gel.  -     diclofenac (VOLTAREN) 1 % gel; Apply 2 g to affected area four (4) times daily. Apply to right knee.    11.  Sinusitis treated with 10 days of Ceftin.        Plan of care:  Discussed diagnoses in detail with patient.     Medication risks/benefits/side effects discussed with patient.     All of the patient's questions were addressed. The patient understands and agrees with our plan of care.    The patient knows to call back if they are unsure of or forget any changes  we discussed today or if the symptoms change.     The patient received an After-Visit Summary which contains VS, orders,  medication list and allergy list. This can be used as a "mini-medical record" should they have to seek medical care while out of town.    Patient Care Team:  [name], MD as PCP - General (Family Practice)  Donalda Ewings Neale Burly, MD as PCP - [facility] Empaneled Provider  Cammy Copa, MD (Cardiology)  Verdene Lennert, MD (Orthopedic Surgery)  Beola Cord., MD (Orthopedic Surgery)  [name], Patrick E, DPM (Podiatry)  Valetta Close, DO as Hospitalist (Infectious Diseases)  Rinaldo Cloud, MD (Orthopedic Surgery)    Follow-up and Dispositions    ?? Return in about 2 months (around 06/04/2018).           Signed By: [name], MD     April 05, 2018      ATTENTION:   This medical record was transcribed using an electronic medical records/speech recognition system.  Although proofread, it may and can contain electronic, spelling and other errors.  Corrections may be executed at a later time.  Please feel free to contact me for any clarifications as needed.

## 2018-04-05 NOTE — Progress Notes (Signed)
 1. Have you been to the ER, urgent care clinic since your last visit?  Hospitalized since your last visit?No    2. Have you seen or consulted any other health care providers outside of the [facility] since your last visit?  Include any pap smears or colon screening. No    Reviewed record in preparation for visit and have necessary documentation  Goals that were addressed and/or need to be completed during or after this appointment include     Health Maintenance Due   Topic Date Due   . Shingrix Vaccine Age 3> (1 of 2) 12/22/1998   . FOBT Q 1 YEAR AGE 66-75  12/22/1998   . Pneumococcal 65+ years (1 of 1 - PPSV23) 12/21/2013       Patient is accompanied by self I have received verbal consent from Elijah [name] to discuss any/all medical information while they are present in the room.

## 2018-04-05 NOTE — Addendum Note (Signed)
Addendum Note by Jacelyn Grip, MD at 04/05/18 1030                Author: Jacelyn Grip, MD  Service: --  Author Type: Physician       Filed: 04/05/18 1307  Encounter Date: 04/05/2018  Status: Signed          Editor: Jacelyn Grip, MD (Physician)          Addended by: Jacelyn Grip on: 04/05/2018 01:07 PM    Modules accepted: Orders

## 2018-04-05 NOTE — Progress Notes (Signed)
Please call patient with a x-ray reading left foot.    No fracture or osteomyelitis. No radiodense foreign body.    Find out where he wants his report faxed.  He said his podiatrist in [place] needs it.    Thank you,  Dr. Mliss Sax

## 2018-04-06 LAB — COMPREHENSIVE METABOLIC PANEL
ALT: 24 U/L (ref 12–78)
AST: 20 U/L (ref 15–37)
Albumin/Globulin Ratio: 0.9 — ABNORMAL LOW (ref 1.1–2.2)
Albumin: 3.4 g/dL — ABNORMAL LOW (ref 3.5–5.0)
Alkaline Phosphatase: 73 U/L (ref 45–117)
Anion Gap: 4 mmol/L — ABNORMAL LOW (ref 5–15)
BUN: 13 MG/DL (ref 6–20)
Bun/Cre Ratio: 11 — ABNORMAL LOW (ref 12–20)
CO2: 26 mmol/L (ref 21–32)
Calcium: 9.1 MG/DL (ref 8.5–10.1)
Chloride: 111 mmol/L — ABNORMAL HIGH (ref 97–108)
Creatinine: 1.21 MG/DL (ref 0.70–1.30)
EGFR IF NonAfrican American: 59 mL/min/{1.73_m2} — ABNORMAL LOW (ref >60)
GFR African American: >60 mL/min/{1.73_m2} (ref >60)
Globulin: 4 g/dL (ref 2.0–4.0)
Glucose: 86 mg/dL (ref 65–100)
Potassium: 4.5 mmol/L (ref 3.5–5.1)
Sodium: 141 mmol/L (ref 136–145)
Total Bilirubin: 0.9 MG/DL (ref 0.2–1.0)
Total Protein: 7.4 g/dL (ref 6.4–8.2)

## 2018-04-06 LAB — T4, FREE
T4 Free: 1 NG/DL (ref 0.8–1.5)
T4, Free: 1 NG/DL (ref 0.8–1.5)

## 2018-04-06 LAB — LIPID PANEL
CHOL/HDL Ratio: 2.9 (ref 0.0–5.0)
Chol/HDL Ratio: 2.9 (ref 0.0–5.0)
Cholesterol, Total: 118 MG/DL (ref <200)
Cholesterol, total: 118 MG/DL (ref <200)
HDL Cholesterol: 41 MG/DL
HDL: 41 MG/DL
LDL Calculated: 53.2 MG/DL (ref 0–100)
LDL, calculated: 53.2 MG/DL (ref 0–100)
Triglyceride: 119 MG/DL (ref <150)
Triglycerides: 119 MG/DL (ref <150)
VLDL Cholesterol Calculated: 23.8 MG/DL
VLDL, calculated: 23.8 MG/DL

## 2018-04-06 LAB — TSH 3RD GENERATION
TSH: 1.57 u[IU]/mL (ref 0.36–3.74)
TSH: 1.57 u[IU]/mL (ref 0.36–3.74)

## 2018-04-06 LAB — VITAMIN B12
Vitamin B-12: 877 pg/mL (ref 193–986)
Vitamin B12: 877 pg/mL (ref 193–986)

## 2018-04-06 LAB — VITAMIN D 25 HYDROXY: Vit D, 25-Hydroxy: 38.5 ng/mL (ref 30–100)

## 2018-04-06 LAB — HEMOGLOBIN
HGB: 12.9 g/dL (ref 12.1–17.0)
Hemoglobin: 12.9 g/dL (ref 12.1–17.0)

## 2018-04-06 LAB — VITAMIN D, 25 HYDROXY: Vitamin D 25-Hydroxy: 38.5 ng/mL (ref 30–100)

## 2018-04-06 LAB — METABOLIC PANEL, COMPREHENSIVE
A-G Ratio: 0.9 — ABNORMAL LOW (ref 1.1–2.2)
ALT (SGPT): 24 U/L (ref 12–78)
AST (SGOT): 20 U/L (ref 15–37)
Albumin: 3.4 g/dL — ABNORMAL LOW (ref 3.5–5.0)
Alk. phosphatase: 73 U/L (ref 45–117)
Anion gap: 4 mmol/L — ABNORMAL LOW (ref 5–15)
BUN/Creatinine ratio: 11 — ABNORMAL LOW (ref 12–20)
BUN: 13 MG/DL (ref 6–20)
Bilirubin, total: 0.9 MG/DL (ref 0.2–1.0)
CO2: 26 mmol/L (ref 21–32)
Calcium: 9.1 MG/DL (ref 8.5–10.1)
Chloride: 111 mmol/L — ABNORMAL HIGH (ref 97–108)
Creatinine: 1.21 MG/DL (ref 0.70–1.30)
GFR est AA: >60 mL/min/{1.73_m2} (ref >60)
GFR est non-AA: 59 mL/min/{1.73_m2} — ABNORMAL LOW (ref >60)
Globulin: 4 g/dL (ref 2.0–4.0)
Glucose: 86 mg/dL (ref 65–100)
Potassium: 4.5 mmol/L (ref 3.5–5.1)
Protein, total: 7.4 g/dL (ref 6.4–8.2)
Sodium: 141 mmol/L (ref 136–145)

## 2018-04-06 NOTE — Telephone Encounter (Signed)
Unable to reach patient by telephone. Detailed letter mailed to patient informing him of the following per Dr. Mliss Sax:     "With exception of a very slight decrease in kidney function your labs are stable. Stay well hydrated and we will repeat this next visit."

## 2018-04-12 NOTE — Telephone Encounter (Signed)
Attempted to reach patient by telephone. A message was left for return call.

## 2018-04-27 DIAGNOSIS — R55 Syncope and collapse: Secondary | ICD-10-CM | POA: Insufficient documentation

## 2018-04-28 DIAGNOSIS — Z9884 Bariatric surgery status: Secondary | ICD-10-CM | POA: Insufficient documentation

## 2018-04-28 DIAGNOSIS — I951 Orthostatic hypotension: Secondary | ICD-10-CM | POA: Insufficient documentation

## 2018-04-28 DIAGNOSIS — S91302A Unspecified open wound, left foot, initial encounter: Secondary | ICD-10-CM | POA: Insufficient documentation

## 2018-04-28 DIAGNOSIS — S91301A Unspecified open wound, right foot, initial encounter: Secondary | ICD-10-CM | POA: Insufficient documentation

## 2018-05-10 NOTE — Telephone Encounter (Signed)
Dr. Jonny Ruiz Klimkiewicz's office is calling to request cardiac clearance for the patient. Patient is scheduled to have joint surgery in 2 weeks. Clearance can be faxed to [phone].    Phone: [phone]

## 2018-05-10 NOTE — Telephone Encounter (Signed)
As long as we call pt and confirm no angina then I am ok with moderate low risk preop letter

## 2018-05-12 NOTE — Telephone Encounter (Signed)
Attempted to reach patient by telephone. A message was left for return call.

## 2018-05-16 ENCOUNTER — Ambulatory Visit: Attending: Family Medicine | Primary: Family Medicine

## 2018-05-16 ENCOUNTER — Ambulatory Visit: Admit: 2018-05-16 | Discharge: 2018-05-16 | Payer: MEDICARE | Attending: Family Medicine | Primary: Family Medicine

## 2018-05-16 ENCOUNTER — Inpatient Hospital Stay: Admit: 2018-05-16 | Payer: MEDICARE | Primary: Family Medicine

## 2018-05-16 DIAGNOSIS — Z01818 Encounter for other preprocedural examination: Secondary | ICD-10-CM

## 2018-05-16 NOTE — Progress Notes (Signed)
1. Have you been to the ER, urgent care clinic since your last visit?  Hospitalized since your last visit?No    2. Have you seen or consulted any other health care providers outside of the [facility] since your last visit?  Include any pap smears or colon screening. No    Reviewed record in preparation for visit and have necessary documentation  Goals that were addressed and/or need to be completed during or after this appointment include     Health Maintenance Due   Topic Date Due   ??? Shingrix Vaccine Age 50> (1 of 2) 12/22/1998   ??? FOBT Q1Y Age 50-75  12/22/1998   ??? Pneumococcal 65+ years (1 of 1 - PPSV23) 12/21/2013       Patient is accompanied by self I have received verbal consent from Elijah [name] to discuss any/all medical information while they are present in the room.

## 2018-05-16 NOTE — Patient Instructions (Addendum)
Apply triple antibiotic ointment to right forehead scab twice daily.    Continue to cleanse the left foot ulcer with betadine twice daily and redress the wound. See podiatry next week and discuss left foot surgery prior to the knee surgery.    Stop lisinopril.    Take colace 200 mg daily with iron tablet.

## 2018-05-16 NOTE — Progress Notes (Signed)
[facility]  [address]  [place], [place]  [phone]           Progress Note    Patient: Elijah [name] MRN: [id number]  SSN: [id number]    Date of Birth: [birth year]  Age: 68 y.o.  Sex: male        Chief Complaint   Patient presents with   ??? Pre-op Exam         Subjective:     Encounter Diagnoses   Name Primary?   ??? Pre-op examination:  He was scheduled to have knee surgery in [place] in 8 days.  Unfortunately he looks so tired and rundown I do not think he should have this until he feels better.  In addition of this he is orthostatic and his surgeon told him he anemic.  L to care thank you     Yes   ??? Essential hypertension, benign:  BP Readings from Last 3 Encounters:   05/16/18 (!) 81/57   04/05/18 117/73   01/04/18 132/73     The patient reports:  taking medications as instructed, no TIA's, no chest pain on exertion, no dyspnea on exertion, no swelling of ankles.    Key CAD CHF Meds             nebivolol (BYSTOLIC) 10 mg tablet (Taking) Take  by mouth daily.    lisinopril (PRINIVIL, ZESTRIL) 20 mg tablet (Taking) TAKE 1 TABLET BY MOUTH DAILY    atorvastatin (LIPITOR) 40 mg tablet (Taking) Take 80 mg by mouth nightly.    DOCOSAHEXANOIC ACID/EPA (FISH OIL PO) (Taking) Take 1 Cap by mouth daily.           Lab Results   Component Value Date/Time    Sodium 141 04/05/2018 12:01 PM    Potassium 4.5 04/05/2018 12:01 PM    Chloride 111 (H) 04/05/2018 12:01 PM    CO2 26 04/05/2018 12:01 PM    Anion gap 4 (L) 04/05/2018 12:01 PM    Glucose 86 04/05/2018 12:01 PM    BUN 13 04/05/2018 12:01 PM    Creatinine 1.21 04/05/2018 12:01 PM    BUN/Creatinine ratio 11 (L) 04/05/2018 12:01 PM    GFR est AA >60 04/05/2018 12:01 PM    GFR est non-AA 59 (L) 04/05/2018 12:01 PM    Calcium 9.1 04/05/2018 12:01 PM    Bilirubin, total 0.9 04/05/2018 12:01 PM    AST (SGOT) 20 04/05/2018 12:01 PM    Alk. phosphatase 73 04/05/2018 12:01 PM    Protein, total 7.4 04/05/2018 12:01 PM     Albumin 3.4 (L) 04/05/2018 12:01 PM    Globulin 4.0 04/05/2018 12:01 PM    A-G Ratio 0.9 (L) 04/05/2018 12:01 PM    ALT (SGPT) 24 04/05/2018 12:01 PM     Low salt diet?yes  Aerobic exercise? no  Our goal is to normalize the blood pressure to decrease the risks of strokes and heart attacks. The patient is in agreement with the plan.        ??? Pure hypercholesterolemia:  Cardiovascular risks for him are: LDL goal is under 80  existing CAD  hyperlipidemia.   Key Antihyperlipidemia Meds             atorvastatin (LIPITOR) 40 mg tablet (Taking) Take 80 mg by mouth nightly.    DOCOSAHEXANOIC ACID/EPA (FISH OIL PO) (Taking) Take 1 Cap by mouth daily.        Lab Results  Component Value Date/Time    Cholesterol, total 118 04/05/2018 12:01 PM    HDL Cholesterol 41 04/05/2018 12:01 PM    LDL, calculated 53.2 04/05/2018 12:01 PM    Triglyceride 119 04/05/2018 12:01 PM    CHOL/HDL Ratio 2.9 04/05/2018 12:01 PM     Lab Results   Component Value Date/Time    ALT (SGPT) 24 04/05/2018 12:01 PM    AST (SGOT) 20 04/05/2018 12:01 PM    Alk. phosphatase 73 04/05/2018 12:01 PM    Bilirubin, direct 0.08 09/20/2016 04:30 PM    Bilirubin, total 0.9 04/05/2018 12:01 PM      Myalgias: No   Fatigue: No   Other side effects: no  Wt Readings from Last 3 Encounters:   05/16/18 285 lb (129.3 kg)   04/05/18 313 lb 9.6 oz (142.2 kg)   01/04/18 (!) 356 lb (161.5 kg)     The patient is aware of our goal to reduce or eliminate the long term problems (such as strokes and heart attacks) related to poorly controlled hyperlipidemia.        ??? 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  This is caused him to have multiple foot ulcerations.        ??? Atherosclerosis of native coronary artery of native heart without angina pectoris:  No chest pain, DOE or SOB. No PND or orthopnea.  EKG is unchanged with exception of occasional PVC.      ??? OSA on CPAP:   Controlled on CPAP. 100% compliant. Feels rested in AM.        ??? Iron deficiency anemia secondary to inadequate dietary iron intake: His anemia has occurred since his surgery.  He was started on iron by his gastric surgeon.  No sx.  Lab Results   Component Value Date/Time    HGB 12.9 04/05/2018 12:01 PM     Lab Results   Component Value Date/Time    Iron 74 02/07/2018 10:29 AM    TIBC 288 02/07/2018 10:29 AM    Iron % saturation 26 02/07/2018 10:29 AM         ??? Orthostatic hypotension:  His blood pressure fell to 81/57 when he stood up.  He has had 1 orthostatic fall already.  He had a blunt force trauma to his right temple area when he fell at home in the bathroom.  He had a 29 stitches in his right forehead/temple area at [facility].  That was about 2 weeks ago.  His sutures were removed last week.  He continues to have a large eschar laterally that he has been putting silver nitrate on apparently. I have asked him to stop that and start using triple antibiotic cream on the eschar so that we can carefully remove it when we see him again.  It does not appear to be infected.        ??? 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  His podiatrist is told he has a bone spur that is causing the abnormal pressure point.  He plans to have surgery to get this corrected at some point.  My question to him was why not have the bone spur surgery done prior to knee surgery so that this also can be healed when he has his knee surgery.  He will see  the podiatrist next week and ask him that question. Continue same wound care.        ??? Weakness: I am very concerned about his generalized weakness.  This may be multifactorial from electrolyte abnormality. dehydration as well as anemia.  He was expecting to feel better at this point in his healing  process from his gastric bypass.  He has lost a total of 70-75 pounds since October having done the surgery.  He says she is weak and has no energy to do anything.  I do not think he is in shape to have knee surgery in 8 days.  I have faxed a letter to his knee surgeon in [place] and told him that I would like for his knee surgery to be postponed.  When I told him that he said that his wife felt the same way and she was worried about him.              Current and past medical information:    Current Medications after this visit::     Current Outpatient Medications   Medication Sig   ??? pantoprazole (PROTONIX) 40 mg tablet TAKE 1 TABLET BY MOUTH DAILY AFTER DISCHARGED FROM HOSPITAL   ??? nebivolol (BYSTOLIC) 10 mg tablet Take  by mouth daily.   ??? diclofenac (VOLTAREN) 1 % gel Apply 2 g to affected area four (4) times daily. Apply to right knee.   ??? pregabalin (LYRICA) 300 mg capsule Take 1 Cap by mouth two (2) times a day. Max Daily Amount: 600 mg. 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 lisinopril (PRINIVIL, ZESTRIL) 20 mg tablet TAKE 1 TABLET BY MOUTH DAILY   ??? cyanocobalamin (VITAMIN B12) 500 mcg tablet Take 1 Tab by mouth daily.   ??? cholecalciferol (VITAMIN D3) 5,000 unit tab tablet Take 1 Tab by mouth daily.   ??? DULoxetine (CYMBALTA) 60 mg capsule Take 1 Cap by mouth daily.   ??? atorvastatin (LIPITOR) 40 mg tablet Take 80 mg by mouth nightly.   ??? co-enzyme Q-10 (CO Q-10) 100 mg capsule Take 100 mg by mouth nightly.   ??? Flaxseed Oil oil Take 1 Cap by mouth daily.   ??? cpap machine kit by Does Not Apply route.   ??? DOCOSAHEXANOIC ACID/EPA (FISH OIL PO) Take 1 Cap by mouth daily.     No current facility-administered medications for this visit.        Patient Active Problem List    Diagnosis Date Noted   ??? Polyneuropathy 05/28/2016     Priority: 1 - One   ??? Morbid obesity due to excess calories ([place]) 04/24/2015     Priority: 1 - One   ??? Essential hypertension, benign 02/21/2015      Priority: 1 - One   ??? HLD (hyperlipidemia) 02/21/2015     Priority: 1 - One   ??? Coronary atherosclerosis of native coronary artery 02/21/2015     Priority: 1 - One   ??? OSA on CPAP 02/21/2015     Priority: 1 - One   ??? Pure hypercholesterolemia 08/23/2013     Priority: 1 - One   ??? 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 Chronic fatigue 09/08/2017   ??? Tubular adenoma of colon 09/23/2015   ??? Benign prostatic hyperplasia 12/29/2006       Past Medical History:   Diagnosis Date   ??? BPH (benign prostatic hyperplasia)    ???  CAD (coronary artery disease)     CABG x 5 2011- [place] with [facility] basal [facility]   ??? History of vascular access device 09/08/2016    [facility] VAT, 5 FR double, R brachial, 48 cm with 1 cm out, LTA   ??? HLD (hyperlipidemia)    ??? HTN (hypertension)    ??? Morbid obesity due to excess calories ([place]) 04/24/2015   ??? OSA (obstructive sleep apnea)    ??? Polyneuropathy    ??? Tubular adenoma of colon     x2 on colonoscopy 09/18/15       Allergies   Allergen Reactions   ??? Metoprolol Other (comments)     Patient states, made me feel horrible.       Past Surgical History:   Procedure Laterality Date   ??? HX HIP REPLACEMENT Bilateral        Social History     Socioeconomic History   ??? Marital status: MARRIED     Spouse name: Not on file   ??? Number of children: Not on file   ??? Years of education: Not on file   ??? Highest education level: Not on file   Tobacco Use   ??? Smoking status: Former Smoker     Types: Cigarettes   ??? Smokeless tobacco: Never Used   Substance and Sexual Activity   ??? Alcohol use: No     Comment: recently has stopped   ??? Drug use: No       Review of Systems   Constitutional: Positive for malaise/fatigue and weight loss. Negative for chills and fever.   HENT: Negative.  Negative for hearing loss.    Eyes: Negative.  Negative for blurred vision and double vision.   Respiratory: Negative.  Negative for cough, sputum production and shortness of breath.     Cardiovascular: Negative.  Negative for chest pain and palpitations.   Gastrointestinal: Negative.  Negative for abdominal pain, blood in stool, heartburn, nausea and vomiting.   Genitourinary: Negative.  Negative for dysuria, frequency and urgency.   Musculoskeletal: Positive for joint pain. Negative for back pain, falls, myalgias and neck pain.        His right knee hurts.  He has a torn meniscus tear.   Skin: Negative.  Negative for rash.   Neurological: Positive for sensory change. Negative for dizziness, tingling, tremors, weakness and headaches.        Severe neuropathy in both lower legs.   Endo/Heme/Allergies: Negative.    Psychiatric/Behavioral: Negative.  Negative for depression.        Objective:     Vitals:    05/16/18 1301 05/16/18 1309   BP: 101/67 (!) 81/57   Pulse: 65    Resp: 18    Temp: 97.6 ??F (36.4 ??C)    TempSrc: Oral    SpO2: 100%    Weight: 285 lb (129.3 kg)    Height: '6\' 1"'  (1.854 m)       Body mass index is 37.6 kg/m??.    Physical Exam  Vitals signs and nursing note reviewed.   Constitutional:       General: He is not in acute distress.     Appearance: He is well-developed. He is not toxic-appearing.      Comments: He looks awful in my opinion.  He is extremely tired looking.  He is weak and walking very slowly.  Mentally he is not as sharp as usual.   HENT:  Head: Normocephalic and atraumatic.      Nose: No congestion.      Mouth/Throat:      Mouth: Mucous membranes are dry.   Eyes:      General: No scleral icterus.        Right eye: No discharge.         Left eye: No discharge.   Cardiovascular:      Rate and Rhythm: Normal rate and regular rhythm.      Heart sounds: Normal heart sounds. No murmur. No friction rub. No gallop.       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  Please note his orthostatic drop in blood pressure.  He says he is not  dizzy when sitting down.  He feels safe in driving home.  This tells me that he is gotten orthostatic over a long period time.  His amlodipine was stopped when he fell and lacerated his forehead and I will stop his lisinopril today.  I will also be doing lab work to check on his hydration status.  Pulmonary:      Effort: Pulmonary effort is normal. No respiratory distress.      Breath sounds: Normal breath sounds. No wheezing, rhonchi or rales.   Abdominal:      General: There is no distension.   Musculoskeletal:         General: No swelling.   Skin:     General: Skin is warm.      Coloration: Skin is not jaundiced.      Findings: No erythema or rash.      Comments: He has a large black flap which appears to be all eschar involving his right forehead laceration.  This is the first time I am seeing this so I do not know what it looks like initially.  He says these sutures were removed last week.   Neurological:      Mental Status: He is alert.           Health Maintenance Due   Topic Date Due   ??? Shingrix Vaccine Age 44> (1 of 2) 12/22/1998   ??? FOBT Q1Y Age 62-75  12/22/1998   ??? Pneumococcal 65+ years (1 of 1 - PPSV23) 12/21/2013         Assessment and orders:     Encounter Diagnoses     ICD-10-CM ICD-9-CM   1. Pre-op examination Z01.818 V72.84   2. Essential hypertension, benign I10 401.1   3. Pure hypercholesterolemia E78.00 272.0   4. Polyneuropathy G62.9 356.9   5. Atherosclerosis of native coronary artery of native heart without angina pectoris I25.10 414.01   6. OSA on CPAP G47.33 327.23    Z99.89 V46.8   7. Iron deficiency anemia secondary to inadequate dietary iron intake D50.8 280.1   8. Orthostatic hypotension I95.1 458.0   9. Chronic ulcer of great toe of left foot, limited to breakdown of skin ([place]) L97.521 707.15   10. Weakness R53.1 780.79     Diagnoses and all orders for this visit:    1. Pre-op examination-I am concerned about his overall health status.  He does not look well.   -     AMB POC EKG ROUTINE W/ 12 LEADS, INTER & REP    2. Essential hypertension, benign-significant orthostasis.  Requires that we stop his lisinopril.  -     METABOLIC PANEL, COMPREHENSIVE;  Future  -     CBC WITH AUTOMATED DIFF; Future  -     T4, FREE; Future    3. Pure hypercholesterolemia-not fasting today  -     METABOLIC PANEL, COMPREHENSIVE; Future    4. Polyneuropathy-severe and predisposes him to get.  Foot ulcerations.  I reviewed his podiatry notes and he had a complex infection of the ulceration on his left foot.      5. Atherosclerosis of native coronary artery of native heart without angina pectoris  -     METABOLIC PANEL, COMPREHENSIVE; Future  -     CBC WITH AUTOMATED DIFF; Future    6. OSA on CPAP no change.  He may not need his CPAP with 75 pound weight loss so he will probably have to be restudied.    7. Iron deficiency anemia secondary to inadequate dietary iron intake  -     CBC WITH AUTOMATED DIFF; Future  -     IRON PROFILE; Future    8. Orthostatic hypotension-stop lisinopril.  -     T4, FREE; Future    9. Chronic ulcer of great toe of left foot, limited to breakdown of skin ([facility])-the ulceration is actually under his fifth toe.  It is completely through the skin.  He does not have any purulent drainage.  It looks clean.  I would prefer that this ulceration healed up before he had knee surgery.  It would be a source of infection for sure    10. Weakness-test electrolytes and CBC.  Stop lisinopril.  -     T4, FREE; Future            Plan of care:  Discussed diagnoses in detail with patient.     Medication risks/benefits/side effects discussed with patient.     All of the patient's questions were addressed. The patient understands and agrees with our plan of care.    The patient knows to call back if they are unsure of or forget any changes we discussed today or if the symptoms change.     The patient received an After-Visit Summary which contains VS, orders,  medication list and allergy list. This can be used as a "mini-medical record" should they have to seek medical care while out of town.    Patient Care Team:  [name], MD as PCP - General (Family Practice)  Donalda Ewings Neale Burly, MD as PCP - [facility] Empaneled Provider  Cammy Copa, MD (Cardiology)  Verdene Lennert, MD (Orthopedic Surgery)  Beola Cord., MD (Orthopedic Surgery)  [name], Patrick E, DPM (Podiatry)  Valetta Close, DO as Hospitalist (Infectious Diseases)  Rinaldo Cloud, MD (Orthopedic Surgery)    Follow-up and Dispositions    ?? Return in about 3 days (around 05/19/2018) for BP check.           Signed By: [name], MD     May 16, 2018      ATTENTION:   This medical record was transcribed using an electronic medical records/speech recognition system.  Although proofread, it may and can contain electronic, spelling and other errors.  Corrections may be executed at a later time.  Please feel free to contact me for any clarifications as needed.

## 2018-05-16 NOTE — Progress Notes (Signed)
[facility]  [address]  [place], [place]  [phone]           Progress Note    Patient: Elijah [name] MRN: [id number]  SSN: [id number]    Date of Birth: [birth year]  Age: 68 y.o.  Sex: male        Chief Complaint   Patient presents with   ??? Pre-op Exam         Subjective:     Encounter Diagnoses   Name Primary?   ??? Pre-op examination:  He was scheduled to have knee surgery in [place] in 8 days.  Unfortunately he looks so tired and rundown I do not think he should have this until he feels better.  In addition of this he is orthostatic and his surgeon told him he anemic.  L to care thank you     Yes   ??? Essential hypertension, benign:  BP Readings from Last 3 Encounters:   05/16/18 (!) 81/57   04/05/18 117/73   01/04/18 132/73     The patient reports:  taking medications as instructed, no TIA's, no chest pain on exertion, no dyspnea on exertion, no swelling of ankles.    Key CAD CHF Meds             nebivolol (BYSTOLIC) 10 mg tablet (Taking) Take  by mouth daily.    lisinopril (PRINIVIL, ZESTRIL) 20 mg tablet (Taking) TAKE 1 TABLET BY MOUTH DAILY    atorvastatin (LIPITOR) 40 mg tablet (Taking) Take 80 mg by mouth nightly.    DOCOSAHEXANOIC ACID/EPA (FISH OIL PO) (Taking) Take 1 Cap by mouth daily.           Lab Results   Component Value Date/Time    Sodium 141 04/05/2018 12:01 PM    Potassium 4.5 04/05/2018 12:01 PM    Chloride 111 (H) 04/05/2018 12:01 PM    CO2 26 04/05/2018 12:01 PM    Anion gap 4 (L) 04/05/2018 12:01 PM    Glucose 86 04/05/2018 12:01 PM    BUN 13 04/05/2018 12:01 PM    Creatinine 1.21 04/05/2018 12:01 PM    BUN/Creatinine ratio 11 (L) 04/05/2018 12:01 PM    GFR est AA >60 04/05/2018 12:01 PM    GFR est non-AA 59 (L) 04/05/2018 12:01 PM    Calcium 9.1 04/05/2018 12:01 PM    Bilirubin, total 0.9 04/05/2018 12:01 PM    AST (SGOT) 20 04/05/2018 12:01 PM    Alk. phosphatase 73 04/05/2018 12:01 PM    Protein, total 7.4 04/05/2018 12:01 PM     Albumin 3.4 (L) 04/05/2018 12:01 PM    Globulin 4.0 04/05/2018 12:01 PM    A-G Ratio 0.9 (L) 04/05/2018 12:01 PM    ALT (SGPT) 24 04/05/2018 12:01 PM     Low salt diet?yes  Aerobic exercise? no  Our goal is to normalize the blood pressure to decrease the risks of strokes and heart attacks. The patient is in agreement with the plan.        ??? Pure hypercholesterolemia:  Cardiovascular risks for him are: LDL goal is under 80  existing CAD  hyperlipidemia.   Key Antihyperlipidemia Meds             atorvastatin (LIPITOR) 40 mg tablet (Taking) Take 80 mg by mouth nightly.    DOCOSAHEXANOIC ACID/EPA (FISH OIL PO) (Taking) Take 1 Cap by mouth daily.        Lab Results  Component Value Date/Time    Cholesterol, total 118 04/05/2018 12:01 PM    HDL Cholesterol 41 04/05/2018 12:01 PM    LDL, calculated 53.2 04/05/2018 12:01 PM    Triglyceride 119 04/05/2018 12:01 PM    CHOL/HDL Ratio 2.9 04/05/2018 12:01 PM     Lab Results   Component Value Date/Time    ALT (SGPT) 24 04/05/2018 12:01 PM    AST (SGOT) 20 04/05/2018 12:01 PM    Alk. phosphatase 73 04/05/2018 12:01 PM    Bilirubin, direct 0.08 09/20/2016 04:30 PM    Bilirubin, total 0.9 04/05/2018 12:01 PM      Myalgias: No   Fatigue: No   Other side effects: no  Wt Readings from Last 3 Encounters:   05/16/18 285 lb (129.3 kg)   04/05/18 313 lb 9.6 oz (142.2 kg)   01/04/18 (!) 356 lb (161.5 kg)     The patient is aware of our goal to reduce or eliminate the long term problems (such as strokes and heart attacks) related to poorly controlled hyperlipidemia.        ??? 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  This is caused him to have multiple foot ulcerations.        ??? Atherosclerosis of native coronary artery of native heart without angina pectoris:  No chest pain, DOE or SOB. No PND or orthopnea.  EKG is unchanged with exception of occasional PVC.      ??? OSA on CPAP:  Controlled on CPAP. 100% compliant. Feels rested in  AM.        ??? Iron deficiency anemia secondary to inadequate dietary iron intake: His anemia has occurred since his surgery.  He was started on iron by his gastric surgeon.  No sx.  Lab Results   Component Value Date/Time    HGB 12.9 04/05/2018 12:01 PM     Lab Results   Component Value Date/Time    Iron 74 02/07/2018 10:29 AM    TIBC 288 02/07/2018 10:29 AM    Iron % saturation 26 02/07/2018 10:29 AM         ??? Orthostatic hypotension:  His blood pressure fell to 81/57 when he stood up.  He has had 1 orthostatic fall already.  He had a blunt force trauma to his right temple area when he fell at home in the bathroom.  He had a 29 stitches in his right forehead/temple area at [facility].  That was about 2 weeks ago.  His sutures were removed last week.  He continues to have a large eschar laterally that he has been putting silver nitrate on apparently. I have asked him to stop that and start using triple antibiotic cream on the eschar so that we can carefully remove it when we see him again.  It does not appear to be infected.        ??? 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  His podiatrist is told he has a bone spur that is causing the abnormal pressure point.  He plans to have surgery to get this corrected at some point.  My question to him was why not have the bone spur surgery done prior to knee surgery so that this also can be healed when he has his knee surgery.  He will see  the podiatrist next week and ask him that question. Continue same wound care.        ??? Weakness: I am very concerned about his generalized weakness.  This may be multifactorial from electrolyte abnormality. dehydration as well as anemia.  He was expecting to feel better at this point in his healing process from his gastric bypass.  He has lost a total of 70-75 pounds since October having done the  surgery.  He says she is weak and has no energy to do anything.  I do not think he is in shape to have knee surgery in 8 days.  I have faxed a letter to his knee surgeon in [place] and told him that I would like for his knee surgery to be postponed.  When I told him that he said that his wife felt the same way and she was worried about him.              Current and past medical information:    Current Medications after this visit::     Current Outpatient Medications   Medication Sig   ??? pantoprazole (PROTONIX) 40 mg tablet TAKE 1 TABLET BY MOUTH DAILY AFTER DISCHARGED FROM HOSPITAL   ??? nebivolol (BYSTOLIC) 10 mg tablet Take  by mouth daily.   ??? diclofenac (VOLTAREN) 1 % gel Apply 2 g to affected area four (4) times daily. Apply to right knee.   ??? pregabalin (LYRICA) 300 mg capsule Take 1 Cap by mouth two (2) times a day. Max Daily Amount: 600 mg. 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 lisinopril (PRINIVIL, ZESTRIL) 20 mg tablet TAKE 1 TABLET BY MOUTH DAILY   ??? cyanocobalamin (VITAMIN B12) 500 mcg tablet Take 1 Tab by mouth daily.   ??? cholecalciferol (VITAMIN D3) 5,000 unit tab tablet Take 1 Tab by mouth daily.   ??? DULoxetine (CYMBALTA) 60 mg capsule Take 1 Cap by mouth daily.   ??? atorvastatin (LIPITOR) 40 mg tablet Take 80 mg by mouth nightly.   ??? co-enzyme Q-10 (CO Q-10) 100 mg capsule Take 100 mg by mouth nightly.   ??? Flaxseed Oil oil Take 1 Cap by mouth daily.   ??? cpap machine kit by Does Not Apply route.   ??? DOCOSAHEXANOIC ACID/EPA (FISH OIL PO) Take 1 Cap by mouth daily.     No current facility-administered medications for this visit.        Patient Active Problem List    Diagnosis Date Noted   ??? Polyneuropathy 05/28/2016     Priority: 1 - One   ??? Morbid obesity due to excess calories ([place]) 04/24/2015     Priority: 1 - One   ??? Essential hypertension, benign 02/21/2015     Priority: 1 - One   ??? HLD (hyperlipidemia) 02/21/2015     Priority: 1 - One   ??? Coronary  atherosclerosis of native coronary artery 02/21/2015     Priority: 1 - One   ??? OSA on CPAP 02/21/2015     Priority: 1 - One   ??? Pure hypercholesterolemia 08/23/2013     Priority: 1 - One   ??? 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 Chronic fatigue 09/08/2017   ??? Tubular adenoma of colon 09/23/2015   ??? Benign prostatic hyperplasia 12/29/2006       Past Medical History:   Diagnosis Date   ??? BPH (benign prostatic hyperplasia)    ???  CAD (coronary artery disease)     CABG x 5 2011- [facility] with [facility] basal [facility]   ??? History of vascular access device 09/08/2016    [facility] VAT, 5 FR double, R brachial, 48 cm with 1 cm out, LTA   ??? HLD (hyperlipidemia)    ??? HTN (hypertension)    ??? Morbid obesity due to excess calories ([place]) 04/24/2015   ??? OSA (obstructive sleep apnea)    ??? Polyneuropathy    ??? Tubular adenoma of colon     x2 on colonoscopy 09/18/15       Allergies   Allergen Reactions   ??? Metoprolol Other (comments)     Patient states, made me feel horrible.       Past Surgical History:   Procedure Laterality Date   ??? HX HIP REPLACEMENT Bilateral        Social History     Socioeconomic History   ??? Marital status: MARRIED     Spouse name: Not on file   ??? Number of children: Not on file   ??? Years of education: Not on file   ??? Highest education level: Not on file   Tobacco Use   ??? Smoking status: Former Smoker     Types: Cigarettes   ??? Smokeless tobacco: Never Used   Substance and Sexual Activity   ??? Alcohol use: No     Comment: recently has stopped   ??? Drug use: No       Review of Systems   Constitutional: Positive for malaise/fatigue and weight loss. Negative for chills and fever.   HENT: Negative.  Negative for hearing loss.    Eyes: Negative.  Negative for blurred vision and double vision.   Respiratory: Negative.  Negative for cough, sputum production and shortness of breath.    Cardiovascular: Negative.  Negative for chest pain and palpitations.   Gastrointestinal:  Negative.  Negative for abdominal pain, blood in stool, heartburn, nausea and vomiting.   Genitourinary: Negative.  Negative for dysuria, frequency and urgency.   Musculoskeletal: Positive for joint pain. Negative for back pain, falls, myalgias and neck pain.        His right knee hurts.  He has a torn meniscus tear.   Skin: Negative.  Negative for rash.   Neurological: Positive for sensory change. Negative for dizziness, tingling, tremors, weakness and headaches.        Severe neuropathy in both lower legs.   Endo/Heme/Allergies: Negative.    Psychiatric/Behavioral: Negative.  Negative for depression.        Objective:     Vitals:    05/16/18 1301 05/16/18 1309   BP: 101/67 (!) 81/57   Pulse: 65    Resp: 18    Temp: 97.6 ??F (36.4 ??C)    TempSrc: Oral    SpO2: 100%    Weight: 285 lb (129.3 kg)    Height: '6\' 1"'$  (1.854 m)       Body mass index is 37.6 kg/m??.    Physical Exam  Vitals signs and nursing note reviewed.   Constitutional:       General: He is not in acute distress.     Appearance: He is well-developed. He is not toxic-appearing.      Comments: He looks awful in my opinion.  He is extremely tired looking.  He is weak and walking very slowly.  Mentally he is not as sharp as usual.   HENT:  Head: Normocephalic and atraumatic.      Nose: No congestion.      Mouth/Throat:      Mouth: Mucous membranes are dry.   Eyes:      General: No scleral icterus.        Right eye: No discharge.         Left eye: No discharge.   Cardiovascular:      Rate and Rhythm: Normal rate and regular rhythm.      Heart sounds: Normal heart sounds. No murmur. No friction rub. No gallop.       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  Please note his orthostatic drop in blood pressure.  He says he is not dizzy when sitting down.  He feels safe in driving home.  This tells me that he is gotten orthostatic over a long period time.  His  amlodipine was stopped when he fell and lacerated his forehead and I will stop his lisinopril today.  I will also be doing lab work to check on his hydration status.  Pulmonary:      Effort: Pulmonary effort is normal. No respiratory distress.      Breath sounds: Normal breath sounds. No wheezing, rhonchi or rales.   Abdominal:      General: There is no distension.   Musculoskeletal:         General: No swelling.   Skin:     General: Skin is warm.      Coloration: Skin is not jaundiced.      Findings: No erythema or rash.      Comments: He has a large black flap which appears to be all eschar involving his right forehead laceration.  This is the first time I am seeing this so I do not know what it looks like initially.  He says these sutures were removed last week.   Neurological:      Mental Status: He is alert.           Health Maintenance Due   Topic Date Due   ??? Shingrix Vaccine Age 34> (1 of 2) 12/22/1998   ??? FOBT Q1Y Age 73-75  12/22/1998   ??? Pneumococcal 65+ years (1 of 1 - PPSV23) 12/21/2013         Assessment and orders:     Encounter Diagnoses     ICD-10-CM ICD-9-CM   1. Pre-op examination Z01.818 V72.84   2. Essential hypertension, benign I10 401.1   3. Pure hypercholesterolemia E78.00 272.0   4. Polyneuropathy G62.9 356.9   5. Atherosclerosis of native coronary artery of native heart without angina pectoris I25.10 414.01   6. OSA on CPAP G47.33 327.23    Z99.89 V46.8   7. Iron deficiency anemia secondary to inadequate dietary iron intake D50.8 280.1   8. Orthostatic hypotension I95.1 458.0   9. Chronic ulcer of great toe of left foot, limited to breakdown of skin ([place]) L97.521 707.15   10. Weakness R53.1 780.79     Diagnoses and all orders for this visit:    1. Pre-op examination-I am concerned about his overall health status.  He does not look well.  -     AMB POC EKG ROUTINE W/ 12 LEADS, INTER & REP    2. Essential hypertension, benign-significant orthostasis.  Requires that we stop his lisinopril.  -      METABOLIC PANEL, COMPREHENSIVE;  Future  -     CBC WITH AUTOMATED DIFF; Future  -     T4, FREE; Future    3. Pure hypercholesterolemia-not fasting today  -     METABOLIC PANEL, COMPREHENSIVE; Future    4. Polyneuropathy-severe and predisposes him to get.  Foot ulcerations.  I reviewed his podiatry notes and he had a complex infection of the ulceration on his left foot.      5. Atherosclerosis of native coronary artery of native heart without angina pectoris  -     METABOLIC PANEL, COMPREHENSIVE; Future  -     CBC WITH AUTOMATED DIFF; Future    6. OSA on CPAP no change.  He may not need his CPAP with 75 pound weight loss so he will probably have to be restudied.    7. Iron deficiency anemia secondary to inadequate dietary iron intake  -     CBC WITH AUTOMATED DIFF; Future  -     IRON PROFILE; Future    8. Orthostatic hypotension-stop lisinopril.  -     T4, FREE; Future    9. Chronic ulcer of great toe of left foot, limited to breakdown of skin ([facility])-the ulceration is actually under his fifth toe.  It is completely through the skin.  He does not have any purulent drainage.  It looks clean.  I would prefer that this ulceration healed up before he had knee surgery.  It would be a source of infection for sure    10. Weakness-test electrolytes and CBC.  Stop lisinopril.  -     T4, FREE; Future            Plan of care:  Discussed diagnoses in detail with patient.     Medication risks/benefits/side effects discussed with patient.     All of the patient's questions were addressed. The patient understands and agrees with our plan of care.    The patient knows to call back if they are unsure of or forget any changes we discussed today or if the symptoms change.     The patient received an After-Visit Summary which contains VS, orders, medication list and allergy list. This can be used as a "mini-medical record" should they have to seek medical care while out of town.    Patient Care Team:  [name], MD as PCP -  General (Family Practice)  Donalda Ewings Neale Burly, MD as PCP - [facility] Empaneled Provider  Cammy Copa, MD (Cardiology)  Verdene Lennert, MD (Orthopedic Surgery)  Beola Cord., MD (Orthopedic Surgery)  [name], Patrick E, DPM (Podiatry)  Valetta Close, DO as Hospitalist (Infectious Diseases)  Rinaldo Cloud, MD (Orthopedic Surgery)    Follow-up and Dispositions    ?? Return in about 3 days (around 05/19/2018) for BP check.           Signed By: [name], MD     May 16, 2018      ATTENTION:   This medical record was transcribed using an electronic medical records/speech recognition system.  Although proofread, it may and can contain electronic, spelling and other errors.  Corrections may be executed at a later time.  Please feel free to contact me for any clarifications as needed.

## 2018-05-16 NOTE — Progress Notes (Signed)
1. Have you been to the ER, urgent care clinic since your last visit?  Hospitalized since your last visit?No    2. Have you seen or consulted any other health care providers outside of the [facility] since your last visit?  Include any pap smears or colon screening. No    Reviewed record in preparation for visit and have necessary documentation  Goals that were addressed and/or need to be completed during or after this appointment include     Health Maintenance Due   Topic Date Due   . Shingrix Vaccine Age 70> (1 of 2) 12/22/1998   . FOBT Q1Y Age 9-75  12/22/1998   . Pneumococcal 65+ years (1 of 1 - PPSV23) 12/21/2013       Patient is accompanied by self I have received verbal consent from [name] to discuss any/all medical information while they are present in the room.

## 2018-05-16 NOTE — Progress Notes (Signed)
Please call the patient.  He is significantly dehydrated which has caused his kidney function to be half normal.  He has to figure out a way to drink more liquids.  His protein level is low so he is not eating enough.  He has to stop focusing on weight loss right now and focus on getting healthy.  Thank you,  Dr. Mliss Sax

## 2018-05-17 LAB — COMPREHENSIVE METABOLIC PANEL
ALT: 39 U/L (ref 12–78)
AST: 34 U/L (ref 15–37)
Albumin/Globulin Ratio: 0.8 — ABNORMAL LOW (ref 1.1–2.2)
Albumin: 3.4 g/dL — ABNORMAL LOW (ref 3.5–5.0)
Alkaline Phosphatase: 84 U/L (ref 45–117)
Anion Gap: 7 mmol/L (ref 5–15)
BUN: 29 MG/DL — ABNORMAL HIGH (ref 6–20)
Bun/Cre Ratio: 14 (ref 12–20)
CO2: 20 mmol/L — ABNORMAL LOW (ref 21–32)
Calcium: 9.3 MG/DL (ref 8.5–10.1)
Chloride: 113 mmol/L — ABNORMAL HIGH (ref 97–108)
Creatinine: 2.01 MG/DL — ABNORMAL HIGH (ref 0.70–1.30)
EGFR IF NonAfrican American: 33 mL/min/{1.73_m2} — ABNORMAL LOW (ref >60)
GFR African American: 40 mL/min/{1.73_m2} — ABNORMAL LOW (ref >60)
Globulin: 4.2 g/dL — ABNORMAL HIGH (ref 2.0–4.0)
Glucose: 83 mg/dL (ref 65–100)
Potassium: 4.8 mmol/L (ref 3.5–5.1)
Sodium: 140 mmol/L (ref 136–145)
Total Bilirubin: 0.5 MG/DL (ref 0.2–1.0)
Total Protein: 7.6 g/dL (ref 6.4–8.2)

## 2018-05-17 LAB — CBC WITH AUTO DIFFERENTIAL
Basophils %: 2 % — ABNORMAL HIGH (ref 0–1)
Basophils Absolute: 0.1 10*3/uL (ref 0.0–0.1)
Eosinophils %: 3 % (ref 0–7)
Eosinophils Absolute: 0.2 10*3/uL (ref 0.0–0.4)
Granulocyte Absolute Count: 0 10*3/uL (ref 0.00–0.04)
Hematocrit: 38.1 % (ref 36.6–50.3)
Hemoglobin: 12.1 g/dL (ref 12.1–17.0)
Immature Granulocytes: 0 % (ref 0.0–0.5)
Lymphocytes %: 30 % (ref 12–49)
Lymphocytes Absolute: 1.6 10*3/uL (ref 0.8–3.5)
MCH: 30.9 PG (ref 26.0–34.0)
MCHC: 31.8 g/dL (ref 30.0–36.5)
MCV: 97.2 FL (ref 80.0–99.0)
MPV: 11.8 FL (ref 8.9–12.9)
Monocytes %: 10 % (ref 5–13)
Monocytes Absolute: 0.5 10*3/uL (ref 0.0–1.0)
NRBC Absolute: 0 10*3/uL (ref 0.00–0.01)
Neutrophils %: 55 % (ref 32–75)
Neutrophils Absolute: 3.1 10*3/uL (ref 1.8–8.0)
Nucleated RBCs: 0 PER 100 WBC
Platelets: 305 10*3/uL (ref 150–400)
RBC: 3.92 M/uL — ABNORMAL LOW (ref 4.10–5.70)
RDW: 15.9 % — ABNORMAL HIGH (ref 11.5–14.5)
WBC: 5.5 10*3/uL (ref 4.1–11.1)

## 2018-05-17 LAB — IRON AND TIBC
Iron Saturation: 35 % (ref 20–50)
Iron: 87 ug/dL (ref 35–150)
TIBC: 251 ug/dL (ref 250–450)

## 2018-05-17 LAB — METABOLIC PANEL, COMPREHENSIVE
A-G Ratio: 0.8 — ABNORMAL LOW (ref 1.1–2.2)
ALT (SGPT): 39 U/L (ref 12–78)
AST (SGOT): 34 U/L (ref 15–37)
Albumin: 3.4 g/dL — ABNORMAL LOW (ref 3.5–5.0)
Alk. phosphatase: 84 U/L (ref 45–117)
Anion gap: 7 mmol/L (ref 5–15)
BUN/Creatinine ratio: 14 (ref 12–20)
BUN: 29 MG/DL — ABNORMAL HIGH (ref 6–20)
Bilirubin, total: 0.5 MG/DL (ref 0.2–1.0)
CO2: 20 mmol/L — ABNORMAL LOW (ref 21–32)
Calcium: 9.3 MG/DL (ref 8.5–10.1)
Chloride: 113 mmol/L — ABNORMAL HIGH (ref 97–108)
Creatinine: 2.01 MG/DL — ABNORMAL HIGH (ref 0.70–1.30)
GFR est AA: 40 mL/min/{1.73_m2} — ABNORMAL LOW (ref >60)
GFR est non-AA: 33 mL/min/{1.73_m2} — ABNORMAL LOW (ref >60)
Globulin: 4.2 g/dL — ABNORMAL HIGH (ref 2.0–4.0)
Glucose: 83 mg/dL (ref 65–100)
Potassium: 4.8 mmol/L (ref 3.5–5.1)
Protein, total: 7.6 g/dL (ref 6.4–8.2)
Sodium: 140 mmol/L (ref 136–145)

## 2018-05-17 LAB — CBC WITH AUTOMATED DIFF
ABS. BASOPHILS: 0.1 10*3/uL (ref 0.0–0.1)
ABS. EOSINOPHILS: 0.2 10*3/uL (ref 0.0–0.4)
ABS. IMM. GRANS.: 0 10*3/uL (ref 0.00–0.04)
ABS. LYMPHOCYTES: 1.6 10*3/uL (ref 0.8–3.5)
ABS. MONOCYTES: 0.5 10*3/uL (ref 0.0–1.0)
ABS. NEUTROPHILS: 3.1 10*3/uL (ref 1.8–8.0)
ABSOLUTE NRBC: 0 10*3/uL (ref 0.00–0.01)
BASOPHILS: 2 % — ABNORMAL HIGH (ref 0–1)
EOSINOPHILS: 3 % (ref 0–7)
HCT: 38.1 % (ref 36.6–50.3)
HGB: 12.1 g/dL (ref 12.1–17.0)
IMMATURE GRANULOCYTES: 0 % (ref 0.0–0.5)
LYMPHOCYTES: 30 % (ref 12–49)
MCH: 30.9 PG (ref 26.0–34.0)
MCHC: 31.8 g/dL (ref 30.0–36.5)
MCV: 97.2 FL (ref 80.0–99.0)
MONOCYTES: 10 % (ref 5–13)
MPV: 11.8 FL (ref 8.9–12.9)
NEUTROPHILS: 55 % (ref 32–75)
NRBC: 0 PER 100 WBC
PLATELET: 305 10*3/uL (ref 150–400)
RBC: 3.92 M/uL — ABNORMAL LOW (ref 4.10–5.70)
RDW: 15.9 % — ABNORMAL HIGH (ref 11.5–14.5)
WBC: 5.5 10*3/uL (ref 4.1–11.1)

## 2018-05-17 LAB — IRON PROFILE
Iron % saturation: 35 % (ref 20–50)
Iron: 87 ug/dL (ref 35–150)
TIBC: 251 ug/dL (ref 250–450)

## 2018-05-17 NOTE — Progress Notes (Signed)
Please call the patient.  He is significantly dehydrated which has caused his kidney function to be half normal.  He has to figure out a way to drink more liquids.  His protein level is low so he is not eating enough.  He has to stop focusing on weight loss right now and focus on getting healthy.  Thank you,  Dr. Gerren Hoffmeier

## 2018-05-17 NOTE — Telephone Encounter (Addendum)
Spoke with patient and informed him of the following: "He is significantly dehydrated which has caused his kidney function to be half normal.  He has to figure out a way to drink more liquids.  His protein level is low so he is not eating enough.  He has to stop focusing on weight loss right now and focus on getting healthy."  Patient verbalized understanding.

## 2018-05-19 ENCOUNTER — Ambulatory Visit: Attending: Family Medicine | Primary: Family Medicine

## 2018-05-19 ENCOUNTER — Encounter

## 2018-05-19 ENCOUNTER — Ambulatory Visit: Admit: 2018-05-19 | Discharge: 2018-05-19 | Payer: MEDICARE | Attending: Family Medicine | Primary: Family Medicine

## 2018-05-19 ENCOUNTER — Inpatient Hospital Stay: Admit: 2018-05-19 | Payer: MEDICARE | Primary: Family Medicine

## 2018-05-19 DIAGNOSIS — E860 Dehydration: Secondary | ICD-10-CM

## 2018-05-19 MED ORDER — DULOXETINE 60 MG CAP, DELAYED RELEASE
60 mg | ORAL_CAPSULE | ORAL | 2 refills | Status: DC
Start: 2018-05-19 — End: 2018-06-13

## 2018-05-19 MED ORDER — PREGABALIN 300 MG CAP
300 mg | ORAL_CAPSULE | ORAL | 2 refills | Status: DC
Start: 2018-05-19 — End: 2018-06-13

## 2018-05-19 MED ORDER — BYSTOLIC 10 MG TABLET
10 mg | ORAL_TABLET | ORAL | 2 refills | Status: DC
Start: 2018-05-19 — End: 2018-06-13

## 2018-05-19 NOTE — Patient Instructions (Addendum)
See Plastic Surgery and  Podiatry next week.  See me in 3 days, eat 4-5 small meals daily and continue to hydrate regularly.  Cut dose of cholesterol medication in half.

## 2018-05-19 NOTE — Progress Notes (Signed)
[facility]  [address]  [place], [place]  [phone]           Progress Note    Patient: Elijah [name] MRN: [id number]  SSN: [id number]    Date of Birth: Nov 16, 1950  Age: 68 y.o.  Sex: male        Chief Complaint   Patient presents with   ??? Blood Pressure Check         Subjective:     Encounter Diagnoses   Name Primary?   ??? Dehydration:looks better and orthostasis is better after changes last week.     Yes   ??? Orthostasis:no sx but 15 points still.        ??? Essential hypertension, benign:  BP Readings from Last 3 Encounters:   05/19/18 109/69   05/16/18 (!) 81/57   04/05/18 117/73     The patient reports:  taking medications as instructed, no medication side effects noted, no TIA's, no chest pain on exertion, no dyspnea on exertion, no swelling of ankles.    Key CAD CHF Meds             BYSTOLIC 10 mg tablet (Taking) TAKE 1 TABLET BY MOUTH EVERY DAY    atorvastatin (LIPITOR) 40 mg tablet (Taking) Take 80 mg by mouth nightly.    DOCOSAHEXANOIC ACID/EPA (FISH OIL PO) (Taking) Take 1 Cap by mouth daily.    nebivolol (BYSTOLIC) 10 mg tablet (Discontinued) Take  by mouth daily.    lisinopril (PRINIVIL, ZESTRIL) 20 mg tablet TAKE 1 TABLET BY MOUTH DAILY           Lab Results   Component Value Date/Time    Sodium 142 05/19/2018 02:21 PM    Potassium 4.9 05/19/2018 02:21 PM    Chloride 115 (H) 05/19/2018 02:21 PM    CO2 23 05/19/2018 02:21 PM    Anion gap 4 (L) 05/19/2018 02:21 PM    Glucose 94 05/19/2018 02:21 PM    BUN 27 (H) 05/19/2018 02:21 PM    Creatinine 1.40 (H) 05/19/2018 02:21 PM    BUN/Creatinine ratio 19 05/19/2018 02:21 PM    GFR est AA >60 05/19/2018 02:21 PM    GFR est non-AA 50 (L) 05/19/2018 02:21 PM    Calcium 8.9 05/19/2018 02:21 PM    Bilirubin, total 0.5 05/16/2018 01:55 PM    AST (SGOT) 34 05/16/2018 01:55 PM    Alk. phosphatase 84 05/16/2018 01:55 PM    Protein, total 7.6 05/16/2018 01:55 PM    Albumin 3.4 (L) 05/16/2018 01:55 PM     Globulin 4.2 (H) 05/16/2018 01:55 PM    A-G Ratio 0.8 (L) 05/16/2018 01:55 PM    ALT (SGPT) 39 05/16/2018 01:55 PM     Low salt diet?yes  Aerobic exercise? no  Our goal is to normalize the blood pressure to decrease the risks of strokes and heart attacks. The patient is in agreement with the plan.          ??? Laceration of forehead, right, complicated, sequela:  Lateral flap not attached. Will need revision.        ??? OSA on CPAP: trouble with mouth falling open. With wt loss he needs chin strap and mask refitting.        Current and past medical information:    Current Medications after this visit::     Current Outpatient Medications   Medication Sig   ??? BYSTOLIC 10 mg tablet TAKE 1 TABLET  BY MOUTH EVERY DAY   ??? DULoxetine (CYMBALTA) 60 mg capsule TAKE 1 CAPSULE BY MOUTH DAILY   ??? pregabalin (LYRICA) 300 mg capsule TAKE 1 CAPSULE BY MOUTH TWICE A DAY   ??? pantoprazole (PROTONIX) 40 mg tablet TAKE 1 TABLET BY MOUTH DAILY AFTER DISCHARGED FROM HOSPITAL   ??? diclofenac (VOLTAREN) 1 % gel Apply 2 g to affected area four (4) times daily. Apply to right knee.   ??? cyanocobalamin (VITAMIN B12) 500 mcg tablet Take 1 Tab by mouth daily.   ??? cholecalciferol (VITAMIN D3) 5,000 unit tab tablet Take 1 Tab by mouth daily.   ??? atorvastatin (LIPITOR) 40 mg tablet Take 80 mg by mouth nightly.   ??? co-enzyme Q-10 (CO Q-10) 100 mg capsule Take 100 mg by mouth nightly.   ??? Flaxseed Oil oil Take 1 Cap by mouth daily.   ??? cpap machine kit by Does Not Apply route.   ??? DOCOSAHEXANOIC ACID/EPA (FISH OIL PO) Take 1 Cap by mouth daily.   ??? lisinopril (PRINIVIL, ZESTRIL) 20 mg tablet TAKE 1 TABLET BY MOUTH DAILY     No current facility-administered medications for this visit.        Patient Active Problem List    Diagnosis Date Noted   ??? Polyneuropathy 05/28/2016     Priority: 1 - One   ??? Morbid obesity due to excess calories ([place]) 04/24/2015     Priority: 1 - One   ??? Essential hypertension, benign 02/21/2015     Priority: 1 - One    ??? HLD (hyperlipidemia) 02/21/2015     Priority: 1 - One   ??? Coronary atherosclerosis of native coronary artery 02/21/2015     Priority: 1 - One   ??? OSA on CPAP 02/21/2015     Priority: 1 - One   ??? Pure hypercholesterolemia 08/23/2013     Priority: 1 - One   ??? 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 Chronic fatigue 09/08/2017   ??? Tubular adenoma of colon 09/23/2015   ??? Benign prostatic hyperplasia 12/29/2006       Past Medical History:   Diagnosis Date   ??? BPH (benign prostatic hyperplasia)    ??? CAD (coronary artery disease)     CABG [facility]   ??? History of vascular access device 09/08/2016    [facility] VAT, 5 FR double, R brachial, 48 cm with 1 cm out, LTA   ??? HLD (hyperlipidemia)    ??? HTN (hypertension)    ??? Morbid obesity due to excess calories ([place]) 04/24/2015   ??? OSA (obstructive sleep apnea)    ??? Polyneuropathy    ??? Tubular adenoma of colon     x2 on colonoscopy 09/18/15       Allergies   Allergen Reactions   ??? Metoprolol Other (comments)     Patient states, made me feel horrible.       Past Surgical History:   Procedure Laterality Date   ??? HX HIP REPLACEMENT Bilateral        Social History     Socioeconomic History   ??? Marital status: MARRIED     Spouse name: Not on file   ??? Number of children: Not on file   ??? Years of education: Not on file   ??? Highest education level: Not on file   Tobacco Use   ??? Smoking status:  Former Smoker     Types: Cigarettes   ??? Smokeless tobacco: Never Used   Substance and Sexual Activity   ??? Alcohol use: No     Comment: recently has stopped   ??? Drug use: No       Review of Systems   Constitutional: Negative.  Negative for chills, fever, malaise/fatigue and weight loss.   HENT: Negative.  Negative for hearing loss.    Eyes: Negative.  Negative for blurred vision and double vision.   Respiratory: Negative.  Negative for cough, sputum production and shortness of breath.     Cardiovascular: Negative.  Negative for chest pain and palpitations.   Gastrointestinal: Negative.  Negative for abdominal pain, blood in stool, heartburn, nausea and vomiting.   Genitourinary: Negative.  Negative for dysuria, frequency and urgency.   Musculoskeletal: Positive for joint pain. Negative for back pain, falls, myalgias and neck pain.   Skin: Negative.  Negative for rash.        Laceration on right forehead not healing, has unattached flap. He said this blunt force laceration has "horrific". I did not see it initially.   Neurological: Negative.  Negative for dizziness, tingling, tremors, weakness and headaches.   Endo/Heme/Allergies: Negative.    Psychiatric/Behavioral: Negative.  Negative for depression.        Objective:     Vitals:    05/19/18 1331 05/19/18 1335   BP: 124/78 109/69   Pulse: 66    Resp: 20    Temp: 98 ??F (36.7 ??C)    TempSrc: Oral    SpO2: 100%    Weight: 284 lb (128.8 kg)    Height: '6\' 1"'$  (1.854 m)       Body mass index is 37.47 kg/m??.    Physical Exam  Vitals signs and nursing note reviewed.   Constitutional:       General: He is not in acute distress.     Appearance: Normal appearance. He is well-developed. He is not toxic-appearing.   HENT:      Head: Normocephalic and atraumatic.      Nose: No congestion.      Mouth/Throat:      Pharynx: No oropharyngeal exudate or posterior oropharyngeal erythema.   Eyes:      General: No scleral icterus.        Right eye: No discharge.         Left eye: No discharge.   Cardiovascular:      Rate and Rhythm: Normal rate and regular rhythm.      Heart sounds: Normal heart sounds. No murmur. No friction rub. No gallop.    Pulmonary:      Effort: Pulmonary effort is normal. No respiratory distress.      Breath sounds: Normal breath sounds. No wheezing, rhonchi or rales.   Abdominal:      General: There is no distension.   Musculoskeletal:         General: No swelling.   Skin:     General: Skin is warm.      Coloration: Skin is not jaundiced.       Findings: No erythema or rash.      Comments: Laceration flap unattached.   Neurological:      Mental Status: He is alert.   Psychiatric:         Mood and Affect: Mood normal.           Health Maintenance Due   Topic Date Due   ??? Shingrix Vaccine  Age 63> (1 of 2) 12/22/1998   ??? FOBT Q1Y Age 23-75  12/22/1998   ??? Pneumococcal 65+ years (1 of 1 - PPSV23) 12/21/2013         Assessment and orders:     Encounter Diagnoses     ICD-10-CM ICD-9-CM   1. Dehydration E86.0 276.51   2. Orthostasis I95.1 458.0   3. Essential hypertension, benign I10 401.1   4. Laceration of forehead, right, complicated, sequela Z61.09UE 906.0   5. OSA on CPAP G47.33 327.23    Z99.89 V46.8     Diagnoses and all orders for this visit:    1. Dehydration- still needs to improve hydration-  Lab Results   Component Value Date/Time    Sodium 142 05/19/2018 02:21 PM    Potassium 4.9 05/19/2018 02:21 PM    Chloride 115 (H) 05/19/2018 02:21 PM    CO2 23 05/19/2018 02:21 PM    Anion gap 4 (L) 05/19/2018 02:21 PM    Glucose 94 05/19/2018 02:21 PM    BUN 27 (H) 05/19/2018 02:21 PM    Creatinine 1.40 (H) 05/19/2018 02:21 PM    BUN/Creatinine ratio 19 05/19/2018 02:21 PM    GFR est AA >60 05/19/2018 02:21 PM    GFR est non-AA 50 (L) 05/19/2018 02:21 PM    Calcium 8.9 05/19/2018 02:21 PM    Bilirubin, total 0.5 05/16/2018 01:55 PM    AST (SGOT) 34 05/16/2018 01:55 PM    Alk. phosphatase 84 05/16/2018 01:55 PM    Protein, total 7.6 05/16/2018 01:55 PM    Albumin 3.4 (L) 05/16/2018 01:55 PM    Globulin 4.2 (H) 05/16/2018 01:55 PM    A-G Ratio 0.8 (L) 05/16/2018 01:55 PM    ALT (SGPT) 39 05/16/2018 01:55 PM       -     METABOLIC PANEL, BASIC; Future    2. Orthostasis- better  -     METABOLIC PANEL, BASIC; Future  -     T4, FREE; Future  -     TSH 3RD GENERATION; Future    3. Essential hypertension, benign- better.  -     METABOLIC PANEL, BASIC; Future  -     T4, FREE; Future  -     TSH 3RD GENERATION; Future     4. Laceration of forehead, right, complicated, sequela- not healing properly from blunt force trauma. Will need revision  -     REFERRAL TO PLASTIC SURGERY    5. OSA on CPAP- needs chinstrap. Mouth falling open HS. Gets supplies at [facility].      Plan of care:  Discussed diagnoses in detail with patient.     Medication risks/benefits/side effects discussed with patient.     All of the patient's questions were addressed. The patient understands and agrees with our plan of care.    The patient knows to call back if they are unsure of or forget any changes we discussed today or if the symptoms change.     The patient received an After-Visit Summary which contains VS, orders, medication list and allergy list. This can be used as a "mini-medical record" should they have to seek medical care while out of town.    Patient Care Team:  [name], MD as PCP - General (Family Practice)  Donalda Ewings Neale Burly, MD as PCP - [facility] Empaneled Provider  Cammy Copa, MD (Cardiology)  Verdene Lennert, MD (Orthopedic Surgery)  Beola Cord., MD (Orthopedic Surgery)  [name], Patrick E, DPM (  Podiatry)  Valetta Close, DO as Hospitalist (Infectious Diseases)  Rinaldo Cloud, MD (Orthopedic Surgery)    Follow-up and Dispositions    ?? Return in about 3 days (around 05/22/2018).           Signed By: [name], MD     May 19, 2018      ATTENTION:   This medical record was transcribed using an electronic medical records/speech recognition system.  Although proofread, it may and can contain electronic, spelling and other errors.  Corrections may be executed at a later time.  Please feel free to contact me for any clarifications as needed.

## 2018-05-19 NOTE — Progress Notes (Signed)
1. Have you been to the ER, urgent care clinic since your last visit?  Hospitalized since your last visit?No    2. Have you seen or consulted any other health care providers outside of the [facility] since your last visit?  Include any pap smears or colon screening. No    Reviewed record in preparation for visit and have necessary documentation  Goals that were addressed and/or need to be completed during or after this appointment include     Health Maintenance Due   Topic Date Due   ??? Shingrix Vaccine Age 50> (1 of 2) 12/22/1998   ??? FOBT Q1Y Age 50-75  12/22/1998   ??? Pneumococcal 65+ years (1 of 1 - PPSV23) 12/21/2013       Patient is accompanied by self I have received verbal consent from Elijah [name] to discuss any/all medical information while they are present in the room.

## 2018-05-19 NOTE — Progress Notes (Signed)
[facility]  [address]  [place], [place]  [phone]           Progress Note    Patient: Elijah [name] MRN: [id number]  SSN: [id number]    Date of Birth: 1950/05/18  Age: 68 y.o.  Sex: male        Chief Complaint   Patient presents with   ??? Blood Pressure Check         Subjective:     Encounter Diagnoses   Name Primary?   ??? Dehydration:looks better and orthostasis is better after changes last week.     Yes   ??? Orthostasis:no sx but 15 points still.        ??? Essential hypertension, benign:  BP Readings from Last 3 Encounters:   05/19/18 109/69   05/16/18 (!) 81/57   04/05/18 117/73     The patient reports:  taking medications as instructed, no medication side effects noted, no TIA's, no chest pain on exertion, no dyspnea on exertion, no swelling of ankles.    Key CAD CHF Meds             BYSTOLIC 10 mg tablet (Taking) TAKE 1 TABLET BY MOUTH EVERY DAY    atorvastatin (LIPITOR) 40 mg tablet (Taking) Take 80 mg by mouth nightly.    DOCOSAHEXANOIC ACID/EPA (FISH OIL PO) (Taking) Take 1 Cap by mouth daily.    nebivolol (BYSTOLIC) 10 mg tablet (Discontinued) Take  by mouth daily.    lisinopril (PRINIVIL, ZESTRIL) 20 mg tablet TAKE 1 TABLET BY MOUTH DAILY           Lab Results   Component Value Date/Time    Sodium 142 05/19/2018 02:21 PM    Potassium 4.9 05/19/2018 02:21 PM    Chloride 115 (H) 05/19/2018 02:21 PM    CO2 23 05/19/2018 02:21 PM    Anion gap 4 (L) 05/19/2018 02:21 PM    Glucose 94 05/19/2018 02:21 PM    BUN 27 (H) 05/19/2018 02:21 PM    Creatinine 1.40 (H) 05/19/2018 02:21 PM    BUN/Creatinine ratio 19 05/19/2018 02:21 PM    GFR est AA >60 05/19/2018 02:21 PM    GFR est non-AA 50 (L) 05/19/2018 02:21 PM    Calcium 8.9 05/19/2018 02:21 PM    Bilirubin, total 0.5 05/16/2018 01:55 PM    AST (SGOT) 34 05/16/2018 01:55 PM    Alk. phosphatase 84 05/16/2018 01:55 PM    Protein, total 7.6 05/16/2018 01:55 PM    Albumin 3.4 (L) 05/16/2018 01:55 PM    Globulin  4.2 (H) 05/16/2018 01:55 PM    A-G Ratio 0.8 (L) 05/16/2018 01:55 PM    ALT (SGPT) 39 05/16/2018 01:55 PM     Low salt diet?yes  Aerobic exercise? no  Our goal is to normalize the blood pressure to decrease the risks of strokes and heart attacks. The patient is in agreement with the plan.          ??? Laceration of forehead, right, complicated, sequela:  Lateral flap not attached. Will need revision.        ??? OSA on CPAP: trouble with mouth falling open. With wt loss he needs chin strap and mask refitting.        Current and past medical information:    Current Medications after this visit::     Current Outpatient Medications   Medication Sig   ??? BYSTOLIC 10 mg tablet TAKE 1 TABLET  BY MOUTH EVERY DAY   ??? DULoxetine (CYMBALTA) 60 mg capsule TAKE 1 CAPSULE BY MOUTH DAILY   ??? pregabalin (LYRICA) 300 mg capsule TAKE 1 CAPSULE BY MOUTH TWICE A DAY   ??? pantoprazole (PROTONIX) 40 mg tablet TAKE 1 TABLET BY MOUTH DAILY AFTER DISCHARGED FROM HOSPITAL   ??? diclofenac (VOLTAREN) 1 % gel Apply 2 g to affected area four (4) times daily. Apply to right knee.   ??? cyanocobalamin (VITAMIN B12) 500 mcg tablet Take 1 Tab by mouth daily.   ??? cholecalciferol (VITAMIN D3) 5,000 unit tab tablet Take 1 Tab by mouth daily.   ??? atorvastatin (LIPITOR) 40 mg tablet Take 80 mg by mouth nightly.   ??? co-enzyme Q-10 (CO Q-10) 100 mg capsule Take 100 mg by mouth nightly.   ??? Flaxseed Oil oil Take 1 Cap by mouth daily.   ??? cpap machine kit by Does Not Apply route.   ??? DOCOSAHEXANOIC ACID/EPA (FISH OIL PO) Take 1 Cap by mouth daily.   ??? lisinopril (PRINIVIL, ZESTRIL) 20 mg tablet TAKE 1 TABLET BY MOUTH DAILY     No current facility-administered medications for this visit.        Patient Active Problem List    Diagnosis Date Noted   ??? Polyneuropathy 05/28/2016     Priority: 1 - One   ??? Morbid obesity due to excess calories ([place]) 04/24/2015     Priority: 1 - One   ??? Essential hypertension, benign 02/21/2015     Priority: 1 - One   ??? HLD (hyperlipidemia)  02/21/2015     Priority: 1 - One   ??? Coronary atherosclerosis of native coronary artery 02/21/2015     Priority: 1 - One   ??? OSA on CPAP 02/21/2015     Priority: 1 - One   ??? Pure hypercholesterolemia 08/23/2013     Priority: 1 - One   ??? 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 Chronic fatigue 09/08/2017   ??? Tubular adenoma of colon 09/23/2015   ??? Benign prostatic hyperplasia 12/29/2006       Past Medical History:   Diagnosis Date   ??? BPH (benign prostatic hyperplasia)    ??? CAD (coronary artery disease)     CABG x 5 2011- nuke [facility]   ??? History of vascular access device 09/08/2016    [facility] VAT, 5 FR double, R brachial, 48 cm with 1 cm out, LTA   ??? HLD (hyperlipidemia)    ??? HTN (hypertension)    ??? Morbid obesity due to excess calories ([place]) 04/24/2015   ??? OSA (obstructive sleep apnea)    ??? Polyneuropathy    ??? Tubular adenoma of colon     x2 on colonoscopy 09/18/15       Allergies   Allergen Reactions   ??? Metoprolol Other (comments)     Patient states, made me feel horrible.       Past Surgical History:   Procedure Laterality Date   ??? HX HIP REPLACEMENT Bilateral        Social History     Socioeconomic History   ??? Marital status: MARRIED     Spouse name: Not on file   ??? Number of children: Not on file   ??? Years of education: Not on file   ??? Highest education level: Not on file   Tobacco Use   ??? Smoking status:  Former Smoker     Types: Cigarettes   ??? Smokeless tobacco: Never Used   Substance and Sexual Activity   ??? Alcohol use: No     Comment: recently has stopped   ??? Drug use: No       Review of Systems   Constitutional: Negative.  Negative for chills, fever, malaise/fatigue and weight loss.   HENT: Negative.  Negative for hearing loss.    Eyes: Negative.  Negative for blurred vision and double vision.   Respiratory: Negative.  Negative for cough, sputum production and shortness of breath.    Cardiovascular: Negative.  Negative for chest  pain and palpitations.   Gastrointestinal: Negative.  Negative for abdominal pain, blood in stool, heartburn, nausea and vomiting.   Genitourinary: Negative.  Negative for dysuria, frequency and urgency.   Musculoskeletal: Positive for joint pain. Negative for back pain, falls, myalgias and neck pain.   Skin: Negative.  Negative for rash.        Laceration on right forehead not healing, has unattached flap. He said this blunt force laceration has "horrific". I did not see it initially.   Neurological: Negative.  Negative for dizziness, tingling, tremors, weakness and headaches.   Endo/Heme/Allergies: Negative.    Psychiatric/Behavioral: Negative.  Negative for depression.        Objective:     Vitals:    05/19/18 1331 05/19/18 1335   BP: 124/78 109/69   Pulse: 66    Resp: 20    Temp: 98 ??F (36.7 ??C)    TempSrc: Oral    SpO2: 100%    Weight: 284 lb (128.8 kg)    Height: 6' 1" (1.854 m)       Body mass index is 37.47 kg/m??.    Physical Exam  Vitals signs and nursing note reviewed.   Constitutional:       General: He is not in acute distress.     Appearance: Normal appearance. He is well-developed. He is not toxic-appearing.   HENT:      Head: Normocephalic and atraumatic.      Nose: No congestion.      Mouth/Throat:      Pharynx: No oropharyngeal exudate or posterior oropharyngeal erythema.   Eyes:      General: No scleral icterus.        Right eye: No discharge.         Left eye: No discharge.   Cardiovascular:      Rate and Rhythm: Normal rate and regular rhythm.      Heart sounds: Normal heart sounds. No murmur. No friction rub. No gallop.    Pulmonary:      Effort: Pulmonary effort is normal. No respiratory distress.      Breath sounds: Normal breath sounds. No wheezing, rhonchi or rales.   Abdominal:      General: There is no distension.   Musculoskeletal:         General: No swelling.   Skin:     General: Skin is warm.      Coloration: Skin is not jaundiced.      Findings: No erythema or rash.      Comments:  Laceration flap unattached.   Neurological:      Mental Status: He is alert.   Psychiatric:         Mood and Affect: Mood normal.           Health Maintenance Due   Topic Date Due   ??? Shingrix Vaccine  Age 75> (1 of 2) 12/22/1998   ??? FOBT Q1Y Age 54-75  12/22/1998   ??? Pneumococcal 65+ years (1 of 1 - PPSV23) 12/21/2013         Assessment and orders:     Encounter Diagnoses     ICD-10-CM ICD-9-CM   1. Dehydration E86.0 276.51   2. Orthostasis I95.1 458.0   3. Essential hypertension, benign I10 401.1   4. Laceration of forehead, right, complicated, sequela L07.86LJ 906.0   5. OSA on CPAP G47.33 327.23    Z99.89 V46.8     Diagnoses and all orders for this visit:    1. Dehydration- still needs to improve hydration-  Lab Results   Component Value Date/Time    Sodium 142 05/19/2018 02:21 PM    Potassium 4.9 05/19/2018 02:21 PM    Chloride 115 (H) 05/19/2018 02:21 PM    CO2 23 05/19/2018 02:21 PM    Anion gap 4 (L) 05/19/2018 02:21 PM    Glucose 94 05/19/2018 02:21 PM    BUN 27 (H) 05/19/2018 02:21 PM    Creatinine 1.40 (H) 05/19/2018 02:21 PM    BUN/Creatinine ratio 19 05/19/2018 02:21 PM    GFR est AA >60 05/19/2018 02:21 PM    GFR est non-AA 50 (L) 05/19/2018 02:21 PM    Calcium 8.9 05/19/2018 02:21 PM    Bilirubin, total 0.5 05/16/2018 01:55 PM    AST (SGOT) 34 05/16/2018 01:55 PM    Alk. phosphatase 84 05/16/2018 01:55 PM    Protein, total 7.6 05/16/2018 01:55 PM    Albumin 3.4 (L) 05/16/2018 01:55 PM    Globulin 4.2 (H) 05/16/2018 01:55 PM    A-G Ratio 0.8 (L) 05/16/2018 01:55 PM    ALT (SGPT) 39 05/16/2018 01:55 PM       -     METABOLIC PANEL, BASIC; Future    2. Orthostasis- better  -     METABOLIC PANEL, BASIC; Future  -     T4, FREE; Future  -     TSH 3RD GENERATION; Future    3. Essential hypertension, benign- better.  -     METABOLIC PANEL, BASIC; Future  -     T4, FREE; Future  -     TSH 3RD GENERATION; Future    4. Laceration of forehead, right, complicated, sequela- not healing properly from blunt force  trauma. Will need revision  -     REFERRAL TO PLASTIC SURGERY    5. OSA on CPAP- needs chinstrap. Mouth falling open HS. Gets supplies at [facility].      Plan of care:  Discussed diagnoses in detail with patient.     Medication risks/benefits/side effects discussed with patient.     All of the patient's questions were addressed. The patient understands and agrees with our plan of care.    The patient knows to call back if they are unsure of or forget any changes we discussed today or if the symptoms change.     The patient received an After-Visit Summary which contains VS, orders, medication list and allergy list. This can be used as a "mini-medical record" should they have to seek medical care while out of town.    Patient Care Team:  [name], MD as PCP - General (Family Practice)  Donalda Ewings Neale Burly, MD as PCP - [facility] Empaneled Provider  Cammy Copa, MD (Cardiology)  Verdene Lennert, MD (Orthopedic Surgery)  Beola Cord., MD (Orthopedic Surgery)  [name], Patrick E, DPM (  Podiatry)  Valetta Close, DO as Hospitalist (Infectious Diseases)  Rinaldo Cloud, MD (Orthopedic Surgery)    Follow-up and Dispositions    ?? Return in about 3 days (around 05/22/2018).           Signed By: [name], MD     May 19, 2018      ATTENTION:   This medical record was transcribed using an electronic medical records/speech recognition system.  Although proofread, it may and can contain electronic, spelling and other errors.  Corrections may be executed at a later time.  Please feel free to contact me for any clarifications as needed.

## 2018-05-19 NOTE — Progress Notes (Signed)
1. Have you been to the ER, urgent care clinic since your last visit?  Hospitalized since your last visit?No    2. Have you seen or consulted any other health care providers outside of the [facility] since your last visit?  Include any pap smears or colon screening. No    Reviewed record in preparation for visit and have necessary documentation  Goals that were addressed and/or need to be completed during or after this appointment include     Health Maintenance Due   Topic Date Due   . Shingrix Vaccine Age 70> (1 of 2) 12/22/1998   . FOBT Q1Y Age 9-75  12/22/1998   . Pneumococcal 65+ years (1 of 1 - PPSV23) 12/21/2013       Patient is accompanied by self I have received verbal consent from [name] to discuss any/all medical information while they are present in the room.

## 2018-05-19 NOTE — Progress Notes (Signed)
Patient in office and advise of lab results.

## 2018-05-19 NOTE — Progress Notes (Signed)
Please call. Thyroid tests are normal and kidney function test is better but not normal yet. Continue to increase hydration.  Thank you,  Dr. Mliss Sax

## 2018-05-20 LAB — BASIC METABOLIC PANEL
Anion Gap: 4 mmol/L — ABNORMAL LOW (ref 5–15)
BUN: 27 MG/DL — ABNORMAL HIGH (ref 6–20)
Bun/Cre Ratio: 19 (ref 12–20)
CO2: 23 mmol/L (ref 21–32)
Calcium: 8.9 MG/DL (ref 8.5–10.1)
Chloride: 115 mmol/L — ABNORMAL HIGH (ref 97–108)
Creatinine: 1.4 MG/DL — ABNORMAL HIGH (ref 0.70–1.30)
EGFR IF NonAfrican American: 50 mL/min/{1.73_m2} — ABNORMAL LOW (ref >60)
GFR African American: >60 mL/min/{1.73_m2} (ref >60)
Glucose: 94 mg/dL (ref 65–100)
Potassium: 4.9 mmol/L (ref 3.5–5.1)
Sodium: 142 mmol/L (ref 136–145)

## 2018-05-20 LAB — T4, FREE
T4 Free: 0.9 NG/DL (ref 0.8–1.5)
T4, Free: 0.9 NG/DL (ref 0.8–1.5)

## 2018-05-20 LAB — TSH 3RD GENERATION
TSH: 1.4 u[IU]/mL (ref 0.36–3.74)
TSH: 1.4 u[IU]/mL (ref 0.36–3.74)

## 2018-05-20 LAB — METABOLIC PANEL, BASIC
Anion gap: 4 mmol/L — ABNORMAL LOW (ref 5–15)
BUN/Creatinine ratio: 19 (ref 12–20)
BUN: 27 MG/DL — ABNORMAL HIGH (ref 6–20)
CO2: 23 mmol/L (ref 21–32)
Calcium: 8.9 MG/DL (ref 8.5–10.1)
Chloride: 115 mmol/L — ABNORMAL HIGH (ref 97–108)
Creatinine: 1.4 MG/DL — ABNORMAL HIGH (ref 0.70–1.30)
GFR est AA: >60 mL/min/{1.73_m2} (ref >60)
GFR est non-AA: 50 mL/min/{1.73_m2} — ABNORMAL LOW (ref >60)
Glucose: 94 mg/dL (ref 65–100)
Potassium: 4.9 mmol/L (ref 3.5–5.1)
Sodium: 142 mmol/L (ref 136–145)

## 2018-05-20 LAB — SAMPLES BEING HELD

## 2018-05-20 NOTE — Progress Notes (Signed)
Please call. Thyroid tests are normal and kidney function test is better but not normal yet. Continue to increase hydration.  Thank you,  Dr. Greyden Besecker

## 2018-05-22 ENCOUNTER — Ambulatory Visit: Attending: Family Medicine | Primary: Family Medicine

## 2018-05-22 ENCOUNTER — Ambulatory Visit: Admit: 2018-05-22 | Discharge: 2018-05-22 | Payer: MEDICARE | Attending: Family Medicine | Primary: Family Medicine

## 2018-05-22 DIAGNOSIS — Y832 Surgical operation with anastomosis, bypass or graft as the cause of abnormal reaction of the patient, or of later complication, without mention of misadventure at the time of the procedure: Secondary | ICD-10-CM | POA: Insufficient documentation

## 2018-05-22 DIAGNOSIS — K9189 Other postprocedural complications and disorders of digestive system: Secondary | ICD-10-CM | POA: Insufficient documentation

## 2018-05-22 DIAGNOSIS — I2510 Atherosclerotic heart disease of native coronary artery without angina pectoris: Secondary | ICD-10-CM

## 2018-05-22 NOTE — Patient Instructions (Signed)
[facility]  Affiliated with [facility]  [address] [place]  [address]  [phone]    Monitor blood pressure outside the office several times weekly at different times during the day and evening. Bring the record to me in 3 weeks for review.    Blood Pressure Record     Patient Name:  ______________________ DOB:  ______________________    Date/Time BP Reading Pulse

## 2018-05-22 NOTE — Progress Notes (Signed)
1. Have you been to the ER, urgent care clinic since your last visit?  Hospitalized since your last visit?No    2. Have you seen or consulted any other health care providers outside of the [facility] since your last visit?  Include any pap smears or colon screening. No    Reviewed record in preparation for visit and have necessary documentation  Goals that were addressed and/or need to be completed during or after this appointment include     Health Maintenance Due   Topic Date Due   ??? FOBT Q1Y Age 50-75  12/22/1998   ??? Pneumococcal 65+ years (1 of 1 - PPSV23) 12/21/2013   ??? Shingrix Vaccine Age 50> (2 of 2) 05/04/2018       Patient is accompanied by self I have received verbal consent from Elijah [name] to discuss any/all medical information while they are present in the room.

## 2018-05-22 NOTE — Progress Notes (Signed)
[facility]  [address]  [place], [place]  [phone]           Progress Note    Patient: Elijah [name] MRN: [id number]  SSN: [id number]    Date of Birth: [birth year]  Age: 68 y.o.  Sex: male        Chief Complaint   Patient presents with   ??? Wound Check         Subjective:     Encounter Diagnoses   Name Primary?   ??? Atherosclerosis of native coronary artery of native heart without angina pectoris:  He needs cardiac follow-up after his gastric bypass.  I have asked him to see Dr. Bonne Dolores wall when he's office here.  He does not like to travel to [place] to get anything done.     Yes   ??? Dehydration:  His dehydration is obviously better.  His weight is up 2 pounds is blood pressure is stable although borderline low with no orthostatic change.  His color is better and he looks like he has much more energy.        ??? Complications of gastric bypass surgery: The dehydration, fall and orthostatic change in blood pressure resulting in renal dysfunction are all complications of his gastric bypass surgery.      ??? Laceration of right side of forehead with complication, subsequent encounter: He was given names and numbers for plastic surgeons in the [place] area.  This way he can make sure the appointment fits a schedule.  Is extremely busy in his personal ventures.        ??? Orthostatic hypotension: No orthostatic change today.        ??? 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  After previous conversations with him regarding this issue I recommended he go to a podiatrist in [place] in whom he has the utmost confidence.      Current and past medical information:    Current Medications after this visit::     Current Outpatient Medications   Medication Sig   ??? BYSTOLIC 10 mg tablet TAKE 1 TABLET BY MOUTH EVERY DAY    ??? DULoxetine (CYMBALTA) 60 mg capsule TAKE 1 CAPSULE BY MOUTH DAILY   ??? pregabalin (LYRICA) 300 mg capsule TAKE 1 CAPSULE BY MOUTH TWICE A DAY   ??? pantoprazole (PROTONIX) 40 mg tablet TAKE 1 TABLET BY MOUTH DAILY AFTER DISCHARGED FROM HOSPITAL   ??? diclofenac (VOLTAREN) 1 % gel Apply 2 g to affected area four (4) times daily. Apply to right knee.   ??? lisinopril (PRINIVIL, ZESTRIL) 20 mg tablet TAKE 1 TABLET BY MOUTH DAILY   ??? cyanocobalamin (VITAMIN B12) 500 mcg tablet Take 1 Tab by mouth daily.   ??? cholecalciferol (VITAMIN D3) 5,000 unit tab tablet Take 1 Tab by mouth daily.   ??? atorvastatin (LIPITOR) 40 mg tablet Take 80 mg by mouth nightly.   ??? co-enzyme Q-10 (CO Q-10) 100 mg capsule Take 100 mg by mouth nightly.   ??? Flaxseed Oil oil Take 1 Cap by mouth daily.   ??? cpap machine kit by Does Not Apply route.   ??? DOCOSAHEXANOIC ACID/EPA (FISH OIL PO) Take 1 Cap by mouth daily.     No current facility-administered medications for this visit.  Patient Active Problem List    Diagnosis Date Noted   ??? Polyneuropathy 05/28/2016     Priority: 1 - One   ??? Morbid obesity due to excess calories ([place]) 04/24/2015     Priority: 1 - One   ??? Essential hypertension, benign 02/21/2015     Priority: 1 - One   ??? HLD (hyperlipidemia) 02/21/2015     Priority: 1 - One   ??? Coronary atherosclerosis of native coronary artery 02/21/2015     Priority: 1 - One   ??? OSA on CPAP 02/21/2015     Priority: 1 - One   ??? Pure hypercholesterolemia 08/23/2013     Priority: 1 - One   ??? 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 Chronic fatigue 09/08/2017   ??? Tubular adenoma of colon 09/23/2015   ??? Benign prostatic hyperplasia 12/29/2006       Past Medical History:   Diagnosis Date   ??? BPH (benign prostatic hyperplasia)    ??? CAD (coronary artery disease)     CABG [place] 5 [place]    ??? History of vascular access device 09/08/2016    [facility] VAT, 5 FR double, R brachial, 48 cm with 1 cm out, LTA   ??? HLD (hyperlipidemia)    ??? HTN (hypertension)    ??? Morbid obesity due to excess calories ([place]) 04/24/2015   ??? OSA (obstructive sleep apnea)    ??? Polyneuropathy    ??? Tubular adenoma of colon     x2 on colonoscopy 09/18/15       Allergies   Allergen Reactions   ??? Metoprolol Other (comments)     Patient states, made me feel horrible.       Past Surgical History:   Procedure Laterality Date   ??? HX HIP REPLACEMENT Bilateral        Social History     Socioeconomic History   ??? Marital status: MARRIED     Spouse name: Not on file   ??? Number of children: Not on file   ??? Years of education: Not on file   ??? Highest education level: Not on file   Tobacco Use   ??? Smoking status: Former Smoker     Types: Cigarettes   ??? Smokeless tobacco: Never Used   Substance and Sexual Activity   ??? Alcohol use: No     Comment: recently has stopped   ??? Drug use: No       Review of Systems   Constitutional: Negative.  Negative for chills, fever, malaise/fatigue and weight loss.        I think his weight gain is primarily fluid-which is improvement in his fluid balance since I last saw him.  He still says he is not eating very much but is only added strawberries to his tiny meals consisting of about 500 cal of protein.   HENT: Negative.  Negative for hearing loss.    Eyes: Negative.  Negative for blurred vision and double vision.   Respiratory: Negative.  Negative for cough, sputum production and shortness of breath.    Cardiovascular: Negative.  Negative for chest pain and palpitations.   Gastrointestinal: Negative.  Negative for abdominal pain, blood in stool, heartburn, nausea and vomiting.   Genitourinary: Negative.  Negative for dysuria, frequency and urgency.   Musculoskeletal: Negative.  Negative  for back pain, falls, myalgias and neck pain.   Skin: Positive for rash.        Forehead laceration as before.    Neurological: Negative.  Negative for dizziness, tingling, tremors, weakness and headaches.   Endo/Heme/Allergies: Negative.    Psychiatric/Behavioral: Negative.  Negative for depression.        Objective:     Vitals:    05/22/18 1438 05/22/18 1443   BP: 94/55 93/59   Pulse: 63    Resp: 20    Temp: 97.9 ??F (36.6 ??C)    TempSrc: Oral    SpO2: 100%    Weight: 287 lb (130.2 kg)    Height: '6\' 1"'  (1.854 m)       Body mass index is 37.87 kg/m??.    Physical Exam  Vitals signs and nursing note reviewed.   Constitutional:       General: He is not in acute distress.     Appearance: Normal appearance. He is well-developed. He is not toxic-appearing.   HENT:      Head: Normocephalic and atraumatic.      Nose: No congestion.      Mouth/Throat:      Pharynx: No oropharyngeal exudate or posterior oropharyngeal erythema.   Eyes:      General: No scleral icterus.        Right eye: No discharge.         Left eye: No discharge.   Cardiovascular:      Rate and Rhythm: Normal rate and regular rhythm.      Heart sounds: Normal heart sounds. No murmur. No friction rub. No gallop.    Pulmonary:      Effort: Pulmonary effort is normal. No respiratory distress.      Breath sounds: Normal breath sounds. No wheezing, rhonchi or rales.   Abdominal:      General: There is no distension.   Musculoskeletal:         General: No swelling.   Skin:     Coloration: Skin is not jaundiced.      Findings: No erythema or rash.      Comments: I did not examine his foot ulcer today.  I have asked him to follow-up with his podiatrist for this.     Neurological:      Mental Status: He is alert.           Health Maintenance Due   Topic Date Due   ??? FOBT Q1Y Age 61-75  12/22/1998   ??? Pneumococcal 65+ years (1 of 1 - PPSV23) 12/21/2013   ??? Shingrix Vaccine Age 59> (2 of 2) 05/04/2018         Assessment and orders:     Encounter Diagnoses     ICD-10-CM ICD-9-CM   1. Atherosclerosis of native coronary artery of native heart without angina pectoris I25.10 414.01    2. Dehydration E86.0 276.51   3. Complications of gastric bypass surgery K91.89 997.49    Y83.2 E878.2   4. Laceration of right side of forehead with complication, subsequent encounter S01.81XD V58.89     873.52   5. Orthostatic hypotension I95.1 458.0   6. 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 Atherosclerosis of native coronary artery of native heart without angina pectoris  -     REFERRAL TO CARDIOLOGY    2. Dehydration-resolved clinically.  He still needs a recheck of his decrease kidney function next week.    3. Complications of gastric bypass surgery-see HPI    4. Laceration of right side of forehead with complication, subsequent encounter-he will make his own appointment to see plastic surgery    5. Orthostatic hypotension- resolved    6. 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            Plan of care:  Discussed diagnoses in detail with patient.     Medication risks/benefits/side effects discussed with patient.     All of the patient's questions were addressed. The patient understands and agrees with our plan of care.    The patient knows to call back if they are unsure of or forget any changes we discussed today or if the symptoms change.     The patient received an After-Visit Summary which contains VS, orders, medication list and allergy list. This can be used as a "mini-medical record" should they have to seek medical care while out of town.    Patient Care Team:  [name], MD as PCP - General (Family Practice)  Donalda Ewings Neale Burly, MD as PCP - [facility] Empaneled Provider  Cammy Copa, MD (Cardiology)  Verdene Lennert, MD (Orthopedic Surgery)  Beola Cord., MD (Orthopedic Surgery)  [name], Patrick E, DPM (Podiatry)  Valetta Close, DO as Hospitalist (Infectious Diseases)  Rinaldo Cloud, MD (Orthopedic Surgery)     Follow-up and Dispositions    ?? Return in about 1 week (around 05/29/2018).           Signed By: [name], MD     May 22, 2018      ATTENTION:   This medical record was transcribed using an electronic medical records/speech recognition system.  Although proofread, it may and can contain electronic, spelling and other errors.  Corrections may be executed at a later time.  Please feel free to contact me for any clarifications as needed.

## 2018-05-22 NOTE — Progress Notes (Signed)
Patient in office and advise of lab results.

## 2018-05-22 NOTE — Progress Notes (Signed)
[facility]  [address]  [place], [place]  [phone]           Progress Note    Patient: Elijah [name] MRN: [id number]  SSN: [id number]    Date of Birth: [birth year]  Age: 68 y.o.  Sex: male        Chief Complaint   Patient presents with   ??? Wound Check         Subjective:     Encounter Diagnoses   Name Primary?   ??? Atherosclerosis of native coronary artery of native heart without angina pectoris:  He needs cardiac follow-up after his gastric bypass.  I have asked him to see Dr. Bonne Dolores wall when he's office here.  He does not like to travel to [place] to get anything done.     Yes   ??? Dehydration:  His dehydration is obviously better.  His weight is up 2 pounds is blood pressure is stable although borderline low with no orthostatic change.  His color is better and he looks like he has much more energy.        ??? Complications of gastric bypass surgery: The dehydration, fall and orthostatic change in blood pressure resulting in renal dysfunction are all complications of his gastric bypass surgery.      ??? Laceration of right side of forehead with complication, subsequent encounter: He was given names and numbers for plastic surgeons in the [place] area.  This way he can make sure the appointment fits a schedule.  Is extremely busy in his personal ventures.        ??? Orthostatic hypotension: No orthostatic change today.        ??? 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  After previous conversations with him regarding this issue I recommended he go to a podiatrist in [place] in whom he has the utmost confidence.      Current and past medical information:    Current Medications after this visit::     Current Outpatient Medications   Medication Sig   ??? BYSTOLIC 10 mg tablet TAKE 1 TABLET BY MOUTH EVERY DAY    ??? DULoxetine (CYMBALTA) 60 mg capsule TAKE 1 CAPSULE BY MOUTH DAILY   ??? pregabalin (LYRICA) 300 mg capsule TAKE 1 CAPSULE BY MOUTH TWICE A DAY   ??? pantoprazole (PROTONIX) 40 mg tablet TAKE 1 TABLET BY MOUTH DAILY AFTER DISCHARGED FROM HOSPITAL   ??? diclofenac (VOLTAREN) 1 % gel Apply 2 g to affected area four (4) times daily. Apply to right knee.   ??? lisinopril (PRINIVIL, ZESTRIL) 20 mg tablet TAKE 1 TABLET BY MOUTH DAILY   ??? cyanocobalamin (VITAMIN B12) 500 mcg tablet Take 1 Tab by mouth daily.   ??? cholecalciferol (VITAMIN D3) 5,000 unit tab tablet Take 1 Tab by mouth daily.   ??? atorvastatin (LIPITOR) 40 mg tablet Take 80 mg by mouth nightly.   ??? co-enzyme Q-10 (CO Q-10) 100 mg capsule Take 100 mg by mouth nightly.   ??? Flaxseed Oil oil Take 1 Cap by mouth daily.   ??? cpap machine kit by Does Not Apply route.   ??? DOCOSAHEXANOIC ACID/EPA (FISH OIL PO) Take 1 Cap by mouth daily.     No current facility-administered medications for this visit.  Patient Active Problem List    Diagnosis Date Noted   ??? Polyneuropathy 05/28/2016     Priority: 1 - One   ??? Morbid obesity due to excess calories ([place]) 04/24/2015     Priority: 1 - One   ??? Essential hypertension, benign 02/21/2015     Priority: 1 - One   ??? HLD (hyperlipidemia) 02/21/2015     Priority: 1 - One   ??? Coronary atherosclerosis of native coronary artery 02/21/2015     Priority: 1 - One   ??? OSA on CPAP 02/21/2015     Priority: 1 - One   ??? Pure hypercholesterolemia 08/23/2013     Priority: 1 - One   ??? 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 Chronic fatigue 09/08/2017   ??? Tubular adenoma of colon 09/23/2015   ??? Benign prostatic hyperplasia 12/29/2006       Past Medical History:   Diagnosis Date   ??? BPH (benign prostatic hyperplasia)    ??? CAD (coronary artery disease)     CABG x 5 2011[place]   ??? History of vascular access  device 09/08/2016    [facility] VAT, 5 FR double, R brachial, 48 cm with 1 cm out, LTA   ??? HLD (hyperlipidemia)    ??? HTN (hypertension)    ??? Morbid obesity due to excess calories ([place]) 04/24/2015   ??? OSA (obstructive sleep apnea)    ??? Polyneuropathy    ??? Tubular adenoma of colon     x2 on colonoscopy 09/18/15       Allergies   Allergen Reactions   ??? Metoprolol Other (comments)     Patient states, made me feel horrible.       Past Surgical History:   Procedure Laterality Date   ??? HX HIP REPLACEMENT Bilateral        Social History     Socioeconomic History   ??? Marital status: MARRIED     Spouse name: Not on file   ??? Number of children: Not on file   ??? Years of education: Not on file   ??? Highest education level: Not on file   Tobacco Use   ??? Smoking status: Former Smoker     Types: Cigarettes   ??? Smokeless tobacco: Never Used   Substance and Sexual Activity   ??? Alcohol use: No     Comment: recently has stopped   ??? Drug use: No       Review of Systems   Constitutional: Negative.  Negative for chills, fever, malaise/fatigue and weight loss.        I think his weight gain is primarily fluid-which is improvement in his fluid balance since I last saw him.  He still says he is not eating very much but is only added strawberries to his tiny meals consisting of about 500 cal of protein.   HENT: Negative.  Negative for hearing loss.    Eyes: Negative.  Negative for blurred vision and double vision.   Respiratory: Negative.  Negative for cough, sputum production and shortness of breath.    Cardiovascular: Negative.  Negative for chest pain and palpitations.   Gastrointestinal: Negative.  Negative for abdominal pain, blood in stool, heartburn, nausea and vomiting.   Genitourinary: Negative.  Negative for dysuria, frequency and urgency.   Musculoskeletal: Negative.  Negative  for back pain, falls, myalgias and neck pain.   Skin: Positive for rash.        Forehead laceration as before.   Neurological: Negative.  Negative for dizziness,  tingling, tremors, weakness and headaches.   Endo/Heme/Allergies: Negative.    Psychiatric/Behavioral: Negative.  Negative for depression.        Objective:     Vitals:    05/22/18 1438 05/22/18 1443   BP: 94/55 93/59   Pulse: 63    Resp: 20    Temp: 97.9 ??F (36.6 ??C)    TempSrc: Oral    SpO2: 100%    Weight: 287 lb (130.2 kg)    Height: '6\' 1"'$  (1.854 m)       Body mass index is 37.87 kg/m??.    Physical Exam  Vitals signs and nursing note reviewed.   Constitutional:       General: He is not in acute distress.     Appearance: Normal appearance. He is well-developed. He is not toxic-appearing.   HENT:      Head: Normocephalic and atraumatic.      Nose: No congestion.      Mouth/Throat:      Pharynx: No oropharyngeal exudate or posterior oropharyngeal erythema.   Eyes:      General: No scleral icterus.        Right eye: No discharge.         Left eye: No discharge.   Cardiovascular:      Rate and Rhythm: Normal rate and regular rhythm.      Heart sounds: Normal heart sounds. No murmur. No friction rub. No gallop.    Pulmonary:      Effort: Pulmonary effort is normal. No respiratory distress.      Breath sounds: Normal breath sounds. No wheezing, rhonchi or rales.   Abdominal:      General: There is no distension.   Musculoskeletal:         General: No swelling.   Skin:     Coloration: Skin is not jaundiced.      Findings: No erythema or rash.      Comments: I did not examine his foot ulcer today.  I have asked him to follow-up with his podiatrist for this.     Neurological:      Mental Status: He is alert.           Health Maintenance Due   Topic Date Due   ??? FOBT Q1Y Age 26-75  12/22/1998   ??? Pneumococcal 65+ years (1 of 1 - PPSV23) 12/21/2013   ??? Shingrix Vaccine Age 90> (2 of 2) 05/04/2018         Assessment and orders:     Encounter Diagnoses     ICD-10-CM ICD-9-CM   1. Atherosclerosis of native coronary artery of native heart without angina pectoris I25.10 414.01   2. Dehydration E86.0 276.51   3. Complications of  gastric bypass surgery K91.89 997.49    Y83.2 E878.2   4. Laceration of right side of forehead with complication, subsequent encounter S01.81XD V58.89     873.52   5. Orthostatic hypotension I95.1 458.0   6. 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 Atherosclerosis of native coronary artery of native heart without angina pectoris  -     REFERRAL TO CARDIOLOGY    2. Dehydration-resolved clinically.  He still needs a recheck of his decrease kidney function next week.    3. Complications of gastric bypass surgery-see HPI    4. Laceration of right side of forehead with complication, subsequent encounter-he will make his own appointment to see plastic surgery    5. Orthostatic hypotension- resolved    6. 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            Plan of care:  Discussed diagnoses in detail with patient.     Medication risks/benefits/side effects discussed with patient.     All of the patient's questions were addressed. The patient understands and agrees with our plan of care.    The patient knows to call back if they are unsure of or forget any changes we discussed today or if the symptoms change.     The patient received an After-Visit Summary which contains VS, orders, medication list and allergy list. This can be used as a "mini-medical record" should they have to seek medical care while out of town.    Patient Care Team:  [name], MD as PCP - General (Family Practice)  Donalda Ewings Neale Burly, MD as PCP - [facility] Empaneled Provider  Cammy Copa, MD (Cardiology)  Verdene Lennert, MD (Orthopedic Surgery)  Beola Cord., MD (Orthopedic Surgery)  [name], Patrick E, DPM (Podiatry)  Valetta Close, DO as Hospitalist (Infectious Diseases)  Rinaldo Cloud, MD (Orthopedic Surgery)    Follow-up and Dispositions    ?? Return in about 1 week (around  05/29/2018).           Signed By: [name], MD     May 22, 2018      ATTENTION:   This medical record was transcribed using an electronic medical records/speech recognition system.  Although proofread, it may and can contain electronic, spelling and other errors.  Corrections may be executed at a later time.  Please feel free to contact me for any clarifications as needed.

## 2018-05-22 NOTE — Progress Notes (Signed)
1. Have you been to the ER, urgent care clinic since your last visit?  Hospitalized since your last visit?No    2. Have you seen or consulted any other health care providers outside of the [facility] since your last visit?  Include any pap smears or colon screening. No    Reviewed record in preparation for visit and have necessary documentation  Goals that were addressed and/or need to be completed during or after this appointment include     Health Maintenance Due   Topic Date Due   . FOBT Q1Y Age 32-75  12/22/1998   . Pneumococcal 65+ years (1 of 1 - PPSV23) 12/21/2013   . Shingrix Vaccine Age 61> (2 of 2) 05/04/2018       Patient is accompanied by self I have received verbal consent from Elijah [name] to discuss any/all medical information while they are present in the room.

## 2018-05-29 ENCOUNTER — Ambulatory Visit: Attending: Family Medicine | Primary: Family Medicine

## 2018-05-29 ENCOUNTER — Ambulatory Visit: Admit: 2018-05-29 | Discharge: 2018-05-29 | Payer: MEDICARE | Attending: Family Medicine | Primary: Family Medicine

## 2018-05-29 ENCOUNTER — Inpatient Hospital Stay: Admit: 2018-05-29 | Payer: MEDICARE | Primary: Family Medicine

## 2018-05-29 DIAGNOSIS — E860 Dehydration: Secondary | ICD-10-CM

## 2018-05-29 NOTE — Progress Notes (Signed)
1. Have you been to the ER, urgent care clinic since your last visit?  Hospitalized since your last visit?No    2. Have you seen or consulted any other health care providers outside of the [facility] since your last visit?  Include any pap smears or colon screening. No    Reviewed record in preparation for visit and have necessary documentation  Goals that were addressed and/or need to be completed during or after this appointment include     Health Maintenance Due   Topic Date Due   ??? FOBT Q1Y Age 50-75  12/22/1998   ??? Pneumococcal 65+ years (1 of 1 - PPSV23) 12/21/2013   ??? Shingrix Vaccine Age 50> (2 of 2) 05/04/2018       Patient is accompanied by self I have received verbal consent from Elijah [name] to discuss any/all medical information while they are present in the room.

## 2018-05-29 NOTE — Progress Notes (Signed)
[facility]  [address]  [place], [place]  [phone]           Progress Note    Patient: Elijah [name] MRN: [id number]  SSN: [id number]    Date of Birth: 07/11/1950  Age: 68 y.o.  Sex: male        Chief Complaint   Patient presents with   ??? Blood Pressure Check         Subjective:     Encounter Diagnoses   Name Primary?   ??? Dehydration-he seems to be better hydrating himself now.     Yes   ??? Orthostasis-unfortunately he restarted his lisinopril without my permission and he is orthostatic again today.  His cardiologist previously told him not the reason his blood pressure medication including his beta-blocker until his pressure got above 150.  He does not recall that and specifically today says he is having trouble with his memory.  I have asked him to stop the lisinopril again.  I am not worried about his blood pressure until he gets above 841 systolic while standing.        ??? Fatigue, unspecified type: He has no stamina and gets tired after about 4 hours up.  He has started using his chinstrap with his CPAP and his sleep pattern is better and he feels better in the morning.  He is still on a very low calorie diet with limited food types and intake.  He fixated on his weight status post gastric bypass.  He is gained approximately 5 pounds but he was so dehydrated when I saw him to begin with this may very well be all fluid weight.  Advised him to get a meter that measures percent body fat is that will be a better indicator for him.  Unfortunately he is limited in the exercise that he can do because of the ulcers on his feet and his right knee problems.        ??? Essential hypertension, benign;  BP Readings from Last 3 Encounters:   05/29/18 (!) 89/58   05/22/18 93/59   05/19/18 109/69     The patient reports:  taking medications as instructed, no medication side effects noted, no TIA's, no chest pain on exertion, no dyspnea on  exertion, no swelling of ankles.    Key CAD CHF Meds             BYSTOLIC 10 mg tablet (Taking) TAKE 1 TABLET BY MOUTH EVERY DAY    lisinopril (PRINIVIL, ZESTRIL) 20 mg tablet (Taking) TAKE 1 TABLET BY MOUTH DAILY    atorvastatin (LIPITOR) 40 mg tablet (Taking) Take 80 mg by mouth nightly.    DOCOSAHEXANOIC ACID/EPA (FISH OIL PO) (Taking) Take 1 Cap by mouth daily.           Lab Results   Component Value Date/Time    Sodium 142 05/19/2018 02:21 PM    Potassium 4.9 05/19/2018 02:21 PM    Chloride 115 (H) 05/19/2018 02:21 PM    CO2 23 05/19/2018 02:21 PM    Anion gap 4 (L) 05/19/2018 02:21 PM    Glucose 94 05/19/2018 02:21 PM    BUN 27 (H) 05/19/2018 02:21 PM    Creatinine 1.40 (H) 05/19/2018 02:21 PM    BUN/Creatinine ratio 19 05/19/2018 02:21 PM    GFR est AA >60 05/19/2018 02:21 PM    GFR est non-AA 50 (L) 05/19/2018 02:21 PM    Calcium 8.9 05/19/2018  02:21 PM    Bilirubin, total 0.5 05/16/2018 01:55 PM    AST (SGOT) 34 05/16/2018 01:55 PM    Alk. phosphatase 84 05/16/2018 01:55 PM    Protein, total 7.6 05/16/2018 01:55 PM    Albumin 3.4 (L) 05/16/2018 01:55 PM    Globulin 4.2 (H) 05/16/2018 01:55 PM    A-G Ratio 0.8 (L) 05/16/2018 01:55 PM    ALT (SGPT) 39 05/16/2018 01:55 PM     Low salt diet?yes  Aerobic exercise? no  Our goal is to normalize the blood pressure to decrease the risks of strokes and heart attacks. The patient is in agreement with the plan.          ??? Atherosclerosis of native coronary artery of native heart without angina pectoris:  No chest pain.  He is still on his beta-blocker.        ??? Skin ulcer of left foot, limited to breakdown of skin ([place]); we looked at cleaned and redressed his left lateral foot ulcer again today.  There is no drainage but there appears to be a pocket underneath the skin.  He was to have seen the podiatrist this week but he moved back till next week.  The skin ulcer is also affected his ability to walk and exercise.         ??? Skin ulcer of fourth toe of right foot, limited to breakdown of skin ([place]); since seeing him last week he has developed another new skin ulcer in the middle of his right foot proximal to the metatarsal heads.  He says he walks barefooted in his house.  I am not sure what the mechanism is for this new ulcer but he is got a real problem that needs to be taken care of by his podiatrist in [place].                Current and past medical information:    Current Medications after this visit::     Current Outpatient Medications   Medication Sig   ??? BYSTOLIC 10 mg tablet TAKE 1 TABLET BY MOUTH EVERY DAY   ??? DULoxetine (CYMBALTA) 60 mg capsule TAKE 1 CAPSULE BY MOUTH DAILY   ??? pregabalin (LYRICA) 300 mg capsule TAKE 1 CAPSULE BY MOUTH TWICE A DAY   ??? pantoprazole (PROTONIX) 40 mg tablet TAKE 1 TABLET BY MOUTH DAILY AFTER DISCHARGED FROM HOSPITAL   ??? diclofenac (VOLTAREN) 1 % gel Apply 2 g to affected area four (4) times daily. Apply to right knee.   ??? lisinopril (PRINIVIL, ZESTRIL) 20 mg tablet TAKE 1 TABLET BY MOUTH DAILY (Patient taking differently: 10 mg. TAKE 1 TABLET BY MOUTH DAILY)   ??? cyanocobalamin (VITAMIN B12) 500 mcg tablet Take 1 Tab by mouth daily.   ??? cholecalciferol (VITAMIN D3) 5,000 unit tab tablet Take 1 Tab by mouth daily.   ??? atorvastatin (LIPITOR) 40 mg tablet Take 80 mg by mouth nightly.   ??? co-enzyme Q-10 (CO Q-10) 100 mg capsule Take 100 mg by mouth nightly.   ??? Flaxseed Oil oil Take 1 Cap by mouth daily.   ??? cpap machine kit by Does Not Apply route.   ??? DOCOSAHEXANOIC ACID/EPA (FISH OIL PO) Take 1 Cap by mouth daily.     No current facility-administered medications for this visit.        Patient Active Problem List    Diagnosis Date Noted   ??? Polyneuropathy 05/28/2016     Priority: 1 - One   ???  Morbid obesity due to excess calories ([place]) 04/24/2015     Priority: 1 - One   ??? Essential hypertension, benign 02/21/2015     Priority: 1 - One   ??? HLD (hyperlipidemia) 02/21/2015      Priority: 1 - One   ??? Coronary atherosclerosis of native coronary artery 02/21/2015     Priority: 1 - One   ??? OSA on CPAP 02/21/2015     Priority: 1 - One   ??? Pure hypercholesterolemia 08/23/2013     Priority: 1 - One   ??? 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 Chronic fatigue 09/08/2017   ??? Tubular adenoma of colon 09/23/2015   ??? Benign prostatic hyperplasia 12/29/2006       Past Medical History:   Diagnosis Date   ??? BPH (benign prostatic hyperplasia)    ??? CAD (coronary artery disease)     CABG x 5 2011- [place] with [place]   ??? History of vascular access device 09/08/2016    [facility] VAT, 5 FR double, R brachial, 48 cm with 1 cm out, LTA   ??? HLD (hyperlipidemia)    ??? HTN (hypertension)    ??? Morbid obesity due to excess calories ([place]) 04/24/2015   ??? OSA (obstructive sleep apnea)    ??? Polyneuropathy    ??? Tubular adenoma of colon     x2 on colonoscopy 09/18/15       Allergies   Allergen Reactions   ??? Metoprolol Other (comments)     Patient states, made me feel horrible.       Past Surgical History:   Procedure Laterality Date   ??? HX HIP REPLACEMENT Bilateral        Social History     Socioeconomic History   ??? Marital status: MARRIED     Spouse name: Not on file   ??? Number of children: Not on file   ??? Years of education: Not on file   ??? Highest education level: Not on file   Tobacco Use   ??? Smoking status: Former Smoker     Types: Cigarettes   ??? Smokeless tobacco: Never Used   Substance and Sexual Activity   ??? Alcohol use: No     Comment: recently has stopped   ??? Drug use: No       Review of Systems   Constitutional: Negative.  Negative for chills, fever, malaise/fatigue and weight loss.   HENT: Negative.  Negative for hearing loss.    Eyes: Negative.  Negative for blurred vision and double vision.   Respiratory: Negative.  Negative for cough, sputum production and shortness of breath.     Cardiovascular: Negative.  Negative for chest pain and palpitations.   Gastrointestinal: Negative.  Negative for abdominal pain, blood in stool, heartburn, nausea and vomiting.   Genitourinary: Negative.  Negative for dysuria, frequency and urgency.   Musculoskeletal: Negative.  Negative for back pain, falls, myalgias and neck pain.   Skin: Positive for rash.        See discussion about his foot ulcers in the HPI.  In addition to this he cut his own toenails and has nicked multiple toes and has blood in or around the nail.  He is making a series of bad decisions.   Neurological: Negative.  Negative for dizziness, tingling, tremors, weakness and headaches.  Endo/Heme/Allergies: Negative.    Psychiatric/Behavioral: Negative.  Negative for depression.        Objective:     Vitals:    05/29/18 1109 05/29/18 1113   BP: 92/55 (!) 89/58   Pulse: 65    Resp: 20    Temp: 97.5 ??F (36.4 ??C)    TempSrc: Oral    SpO2: 100%    Weight: 289 lb (131.1 kg)    Height: '6\' 1"'  (1.854 m)       Body mass index is 38.13 kg/m??.    Physical Exam  Vitals signs and nursing note reviewed.   Constitutional:       General: He is not in acute distress.     Appearance: Normal appearance. He is well-developed. He is not toxic-appearing.   HENT:      Head: Normocephalic and atraumatic.      Nose: No congestion.      Mouth/Throat:      Pharynx: No oropharyngeal exudate or posterior oropharyngeal erythema.   Eyes:      General: No scleral icterus.        Right eye: No discharge.         Left eye: No discharge.   Cardiovascular:      Rate and Rhythm: Normal rate and regular rhythm.      Heart sounds: Normal heart sounds. No murmur. No friction rub. No gallop.    Pulmonary:      Effort: Pulmonary effort is normal. No respiratory distress.      Breath sounds: Normal breath sounds. No wheezing, rhonchi or rales.   Abdominal:      General: There is no distension.   Musculoskeletal:         General: No swelling.   Skin:     General: Skin is warm.       Coloration: Skin is not jaundiced.      Findings: No erythema or rash.   Neurological:      Mental Status: He is alert.       Health Maintenance Due   Topic Date Due   ??? FOBT Q1Y Age 14-75  12/22/1998   ??? Pneumococcal 65+ years (1 of 1 - PPSV23) 12/21/2013   ??? Shingrix Vaccine Age 28> (2 of 2) 05/04/2018         Assessment and orders:     Encounter Diagnoses     ICD-10-CM ICD-9-CM   1. Dehydration E86.0 276.51   2. Orthostasis I95.1 458.0   3. Fatigue, unspecified type R53.83 780.79   4. Essential hypertension, benign I10 401.1   5. Atherosclerosis of native coronary artery of native heart without angina pectoris I25.10 414.01   6. Skin ulcer of left foot, limited to breakdown of skin ([place]) L97.521 707.15   7. Skin ulcer of fourth toe of right foot, limited to breakdown of skin ([facility]) L97.511 707.15     Diagnoses and all orders for this visit:    1. Dehydration-continue efforts to force fluids  -     METABOLIC PANEL, COMPREHENSIVE; Future    2. Orthostasis-stop the lisinopril as discussed above, do not restart it unless his blood pressure is over 761 systolic standing    3. Fatigue, unspecified type-multifactorial  -     T4, FREE; Future  -     IRON PROFILE; Future  -     VITAMIN B12; Future    4. Essential hypertension, benign-see above    5. Atherosclerosis of native coronary artery of native heart  without angina pectoris-fortunately there is been as he feels he has not had any heart trouble since his surgery    6. Skin ulcer of left foot, limited to breakdown of skin ([facility])-severe ulceration.  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    7. Skin ulcer mid right foot, limited to breakdown of skin ([facility])-this was also cleaned and Betadine solution was applied.  The toes that he clipped inadvertently while cutting his nails were also cleansed and Betadine was applied to the areas.      Plan of care:  Discussed diagnoses in detail with patient.      Medication risks/benefits/side effects discussed with patient.     All of the patient's questions were addressed. The patient understands and agrees with our plan of care.    The patient knows to call back if they are unsure of or forget any changes we discussed today or if the symptoms change.     The patient received an After-Visit Summary which contains VS, orders, medication list and allergy list. This can be used as a "mini-medical record" should they have to seek medical care while out of town.    Patient Care Team:  [name], MD as PCP - General (Family Practice)  Donalda Ewings Neale Burly, MD as PCP - [facility] Empaneled Provider  Cammy Copa, MD (Cardiology)  Verdene Lennert, MD (Orthopedic Surgery)  Beola Cord., MD (Orthopedic Surgery)  [name], Patrick E, DPM (Podiatry)  Valetta Close, DO as Hospitalist (Infectious Diseases)  Rinaldo Cloud, MD (Orthopedic Surgery)          Signed By: [name], MD     May 29, 2018      ATTENTION:   This medical record was transcribed using an electronic medical records/speech recognition system.  Although proofread, it may and can contain electronic, spelling and other errors.  Corrections may be executed at a later time.  Please feel free to contact me for any clarifications as needed.

## 2018-05-29 NOTE — Patient Instructions (Addendum)
Stop lisinopril, check blood pressure standing only.

## 2018-05-29 NOTE — Progress Notes (Signed)
[facility]  [address]  [place], [place]  [phone]           Progress Note    Patient: Elijah [name] MRN: [id number]  SSN: [id number]    Date of Birth: [birth year]  Age: 68 y.o.  Sex: male        Chief Complaint   Patient presents with   ??? Blood Pressure Check         Subjective:     Encounter Diagnoses   Name Primary?   ??? Dehydration-he seems to be better hydrating himself now.     Yes   ??? Orthostasis-unfortunately he restarted his lisinopril without my permission and he is orthostatic again today.  His cardiologist previously told him not the reason his blood pressure medication including his beta-blocker until his pressure got above 150.  He does not recall that and specifically today says he is having trouble with his memory.  I have asked him to stop the lisinopril again.  I am not worried about his blood pressure until he gets above 852 systolic while standing.        ??? Fatigue, unspecified type: He has no stamina and gets tired after about 4 hours up.  He has started using his chinstrap with his CPAP and his sleep pattern is better and he feels better in the morning.  He is still on a very low calorie diet with limited food types and intake.  He fixated on his weight status post gastric bypass.  He is gained approximately 5 pounds but he was so dehydrated when I saw him to begin with this may very well be all fluid weight.  Advised him to get a meter that measures percent body fat is that will be a better indicator for him.  Unfortunately he is limited in the exercise that he can do because of the ulcers on his feet and his right knee problems.        ??? Essential hypertension, benign;  BP Readings from Last 3 Encounters:   05/29/18 (!) 89/58   05/22/18 93/59   05/19/18 109/69     The patient reports:  taking medications as instructed, no medication side effects noted, no TIA's, no chest pain on exertion, no dyspnea on exertion, no swelling of  ankles.    Key CAD CHF Meds             BYSTOLIC 10 mg tablet (Taking) TAKE 1 TABLET BY MOUTH EVERY DAY    lisinopril (PRINIVIL, ZESTRIL) 20 mg tablet (Taking) TAKE 1 TABLET BY MOUTH DAILY    atorvastatin (LIPITOR) 40 mg tablet (Taking) Take 80 mg by mouth nightly.    DOCOSAHEXANOIC ACID/EPA (FISH OIL PO) (Taking) Take 1 Cap by mouth daily.           Lab Results   Component Value Date/Time    Sodium 142 05/19/2018 02:21 PM    Potassium 4.9 05/19/2018 02:21 PM    Chloride 115 (H) 05/19/2018 02:21 PM    CO2 23 05/19/2018 02:21 PM    Anion gap 4 (L) 05/19/2018 02:21 PM    Glucose 94 05/19/2018 02:21 PM    BUN 27 (H) 05/19/2018 02:21 PM    Creatinine 1.40 (H) 05/19/2018 02:21 PM    BUN/Creatinine ratio 19 05/19/2018 02:21 PM    GFR est AA >60 05/19/2018 02:21 PM    GFR est non-AA 50 (L) 05/19/2018 02:21 PM    Calcium 8.9 05/19/2018  02:21 PM    Bilirubin, total 0.5 05/16/2018 01:55 PM    AST (SGOT) 34 05/16/2018 01:55 PM    Alk. phosphatase 84 05/16/2018 01:55 PM    Protein, total 7.6 05/16/2018 01:55 PM    Albumin 3.4 (L) 05/16/2018 01:55 PM    Globulin 4.2 (H) 05/16/2018 01:55 PM    A-G Ratio 0.8 (L) 05/16/2018 01:55 PM    ALT (SGPT) 39 05/16/2018 01:55 PM     Low salt diet?yes  Aerobic exercise? no  Our goal is to normalize the blood pressure to decrease the risks of strokes and heart attacks. The patient is in agreement with the plan.          ??? Atherosclerosis of native coronary artery of native heart without angina pectoris:  No chest pain.  He is still on his beta-blocker.        ??? Skin ulcer of left foot, limited to breakdown of skin ([place]); we looked at cleaned and redressed his left lateral foot ulcer again today.  There is no drainage but there appears to be a pocket underneath the skin.  He was to have seen the podiatrist this week but he moved back till next week.  The skin ulcer is also affected his ability to walk and exercise.        ??? Skin ulcer of fourth toe of right foot, limited to breakdown of skin  ([place]); since seeing him last week he has developed another new skin ulcer in the middle of his right foot proximal to the metatarsal heads.  He says he walks barefooted in his house.  I am not sure what the mechanism is for this new ulcer but he is got a real problem that needs to be taken care of by his podiatrist in [place].                Current and past medical information:    Current Medications after this visit::     Current Outpatient Medications   Medication Sig   ??? BYSTOLIC 10 mg tablet TAKE 1 TABLET BY MOUTH EVERY DAY   ??? DULoxetine (CYMBALTA) 60 mg capsule TAKE 1 CAPSULE BY MOUTH DAILY   ??? pregabalin (LYRICA) 300 mg capsule TAKE 1 CAPSULE BY MOUTH TWICE A DAY   ??? pantoprazole (PROTONIX) 40 mg tablet TAKE 1 TABLET BY MOUTH DAILY AFTER DISCHARGED FROM HOSPITAL   ??? diclofenac (VOLTAREN) 1 % gel Apply 2 g to affected area four (4) times daily. Apply to right knee.   ??? lisinopril (PRINIVIL, ZESTRIL) 20 mg tablet TAKE 1 TABLET BY MOUTH DAILY (Patient taking differently: 10 mg. TAKE 1 TABLET BY MOUTH DAILY)   ??? cyanocobalamin (VITAMIN B12) 500 mcg tablet Take 1 Tab by mouth daily.   ??? cholecalciferol (VITAMIN D3) 5,000 unit tab tablet Take 1 Tab by mouth daily.   ??? atorvastatin (LIPITOR) 40 mg tablet Take 80 mg by mouth nightly.   ??? co-enzyme Q-10 (CO Q-10) 100 mg capsule Take 100 mg by mouth nightly.   ??? Flaxseed Oil oil Take 1 Cap by mouth daily.   ??? cpap machine kit by Does Not Apply route.   ??? DOCOSAHEXANOIC ACID/EPA (FISH OIL PO) Take 1 Cap by mouth daily.     No current facility-administered medications for this visit.        Patient Active Problem List    Diagnosis Date Noted   ??? Polyneuropathy 05/28/2016     Priority: 1 - One   ???  Morbid obesity due to excess calories ([place]) 04/24/2015     Priority: 1 - One   ??? Essential hypertension, benign 02/21/2015     Priority: 1 - One   ??? HLD (hyperlipidemia) 02/21/2015     Priority: 1 - One   ??? Coronary atherosclerosis of native coronary artery 02/21/2015      Priority: 1 - One   ??? OSA on CPAP 02/21/2015     Priority: 1 - One   ??? Pure hypercholesterolemia 08/23/2013     Priority: 1 - One   ??? 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 Chronic fatigue 09/08/2017   ??? Tubular adenoma of colon 09/23/2015   ??? Benign prostatic hyperplasia 12/29/2006       Past Medical History:   Diagnosis Date   ??? BPH (benign prostatic hyperplasia)    ??? CAD (coronary artery disease)     CABG x 5 2011- [place] [facility] [place]   ??? History of vascular access device 09/08/2016    [facility] VAT, 5 FR double, R brachial, 48 cm with 1 cm out, LTA   ??? HLD (hyperlipidemia)    ??? HTN (hypertension)    ??? Morbid obesity due to excess calories ([place]) 04/24/2015   ??? OSA (obstructive sleep apnea)    ??? Polyneuropathy    ??? Tubular adenoma of colon     x2 on colonoscopy 09/18/15       Allergies   Allergen Reactions   ??? Metoprolol Other (comments)     Patient states, made me feel horrible.       Past Surgical History:   Procedure Laterality Date   ??? HX HIP REPLACEMENT Bilateral        Social History     Socioeconomic History   ??? Marital status: MARRIED     Spouse name: Not on file   ??? Number of children: Not on file   ??? Years of education: Not on file   ??? Highest education level: Not on file   Tobacco Use   ??? Smoking status: Former Smoker     Types: Cigarettes   ??? Smokeless tobacco: Never Used   Substance and Sexual Activity   ??? Alcohol use: No     Comment: recently has stopped   ??? Drug use: No       Review of Systems   Constitutional: Negative.  Negative for chills, fever, malaise/fatigue and weight loss.   HENT: Negative.  Negative for hearing loss.    Eyes: Negative.  Negative for blurred vision and double vision.   Respiratory: Negative.  Negative for cough, sputum production and shortness of breath.    Cardiovascular: Negative.  Negative for chest pain and palpitations.   Gastrointestinal:  Negative.  Negative for abdominal pain, blood in stool, heartburn, nausea and vomiting.   Genitourinary: Negative.  Negative for dysuria, frequency and urgency.   Musculoskeletal: Negative.  Negative for back pain, falls, myalgias and neck pain.   Skin: Positive for rash.        See discussion about his foot ulcers in the HPI.  In addition to this he cut his own toenails and has nicked multiple toes and has blood in or around the nail.  He is making a series of bad decisions.   Neurological: Negative.  Negative for dizziness, tingling, tremors, weakness and headaches.  Endo/Heme/Allergies: Negative.    Psychiatric/Behavioral: Negative.  Negative for depression.        Objective:     Vitals:    05/29/18 1109 05/29/18 1113   BP: 92/55 (!) 89/58   Pulse: 65    Resp: 20    Temp: 97.5 ??F (36.4 ??C)    TempSrc: Oral    SpO2: 100%    Weight: 289 lb (131.1 kg)    Height: '6\' 1"'  (1.854 m)       Body mass index is 38.13 kg/m??.    Physical Exam  Vitals signs and nursing note reviewed.   Constitutional:       General: He is not in acute distress.     Appearance: Normal appearance. He is well-developed. He is not toxic-appearing.   HENT:      Head: Normocephalic and atraumatic.      Nose: No congestion.      Mouth/Throat:      Pharynx: No oropharyngeal exudate or posterior oropharyngeal erythema.   Eyes:      General: No scleral icterus.        Right eye: No discharge.         Left eye: No discharge.   Cardiovascular:      Rate and Rhythm: Normal rate and regular rhythm.      Heart sounds: Normal heart sounds. No murmur. No friction rub. No gallop.    Pulmonary:      Effort: Pulmonary effort is normal. No respiratory distress.      Breath sounds: Normal breath sounds. No wheezing, rhonchi or rales.   Abdominal:      General: There is no distension.   Musculoskeletal:         General: No swelling.   Skin:     General: Skin is warm.      Coloration: Skin is not jaundiced.      Findings: No erythema or rash.   Neurological:       Mental Status: He is alert.       Health Maintenance Due   Topic Date Due   ??? FOBT Q1Y Age 26-75  12/22/1998   ??? Pneumococcal 65+ years (1 of 1 - PPSV23) 12/21/2013   ??? Shingrix Vaccine Age 50> (2 of 2) 05/04/2018         Assessment and orders:     Encounter Diagnoses     ICD-10-CM ICD-9-CM   1. Dehydration E86.0 276.51   2. Orthostasis I95.1 458.0   3. Fatigue, unspecified type R53.83 780.79   4. Essential hypertension, benign I10 401.1   5. Atherosclerosis of native coronary artery of native heart without angina pectoris I25.10 414.01   6. Skin ulcer of left foot, limited to breakdown of skin ([place]) L97.521 707.15   7. Skin ulcer of fourth toe of right foot, limited to breakdown of skin (HCC) L97.511 707.15     Diagnoses and all orders for this visit:    1. Dehydration-continue efforts to force fluids  -     METABOLIC PANEL, COMPREHENSIVE; Future    2. Orthostasis-stop the lisinopril as discussed above, do not restart it unless his blood pressure is over 324 systolic standing    3. Fatigue, unspecified type-multifactorial  -     T4, FREE; Future  -     IRON PROFILE; Future  -     VITAMIN B12; Future    4. Essential hypertension, benign-see above    5. Atherosclerosis of native coronary artery of native heart  without angina pectoris-fortunately there is been as he feels he has not had any heart trouble since his surgery    6. Skin ulcer of left foot, limited to breakdown of skin (HCC)-severe ulceration.  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    7. Skin ulcer mid right foot, limited to breakdown of skin (HCC)-this was also cleaned and Betadine solution was applied.  The toes that he clipped inadvertently while cutting his nails were also cleansed and Betadine was applied to the areas.      Plan of care:  Discussed diagnoses in detail with patient.     Medication risks/benefits/side effects discussed with patient.     All of the patient's questions were addressed. The patient  understands and agrees with our plan of care.    The patient knows to call back if they are unsure of or forget any changes we discussed today or if the symptoms change.     The patient received an After-Visit Summary which contains VS, orders, medication list and allergy list. This can be used as a "mini-medical record" should they have to seek medical care while out of town.    Patient Care Team:  [name], MD as PCP - General (Family Practice)  Donalda Ewings Neale Burly, MD as PCP - [facility] Empaneled Provider  Cammy Copa, MD (Cardiology)  Verdene Lennert, MD (Orthopedic Surgery)  Beola Cord., MD (Orthopedic Surgery)  [name], Patrick E, DPM (Podiatry)  Valetta Close, DO as Hospitalist (Infectious Diseases)  Rinaldo Cloud, MD (Orthopedic Surgery)          Signed By: [name], MD     May 29, 2018      ATTENTION:   This medical record was transcribed using an electronic medical records/speech recognition system.  Although proofread, it may and can contain electronic, spelling and other errors.  Corrections may be executed at a later time.  Please feel free to contact me for any clarifications as needed.

## 2018-05-29 NOTE — Progress Notes (Signed)
1. Have you been to the ER, urgent care clinic since your last visit?  Hospitalized since your last visit?No    2. Have you seen or consulted any other health care providers outside of the [facility] since your last visit?  Include any pap smears or colon screening. No    Reviewed record in preparation for visit and have necessary documentation  Goals that were addressed and/or need to be completed during or after this appointment include     Health Maintenance Due   Topic Date Due   . FOBT Q1Y Age 32-75  12/22/1998   . Pneumococcal 65+ years (1 of 1 - PPSV23) 12/21/2013   . Shingrix Vaccine Age 61> (2 of 2) 05/04/2018       Patient is accompanied by self I have received verbal consent from Elijah [name] to discuss any/all medical information while they are present in the room.

## 2018-05-29 NOTE — Progress Notes (Signed)
Please add tsh.  Low thyroid function.  Thank you,  Dr. Mliss Sax

## 2018-05-30 ENCOUNTER — Encounter

## 2018-05-30 LAB — COMPREHENSIVE METABOLIC PANEL
ALT: 27 U/L (ref 12–78)
AST: 21 U/L (ref 15–37)
Albumin/Globulin Ratio: 1 — ABNORMAL LOW (ref 1.1–2.2)
Albumin: 3.5 g/dL (ref 3.5–5.0)
Alkaline Phosphatase: 69 U/L (ref 45–117)
Anion Gap: 7 mmol/L (ref 5–15)
BUN: 18 MG/DL (ref 6–20)
Bun/Cre Ratio: 14 (ref 12–20)
CO2: 23 mmol/L (ref 21–32)
Calcium: 9.3 MG/DL (ref 8.5–10.1)
Chloride: 114 mmol/L — ABNORMAL HIGH (ref 97–108)
Creatinine: 1.28 MG/DL (ref 0.70–1.30)
EGFR IF NonAfrican American: 56 mL/min/{1.73_m2} — ABNORMAL LOW (ref >60)
GFR African American: >60 mL/min/{1.73_m2} (ref >60)
Globulin: 3.5 g/dL (ref 2.0–4.0)
Glucose: 76 mg/dL (ref 65–100)
Potassium: 4.1 mmol/L (ref 3.5–5.1)
Sodium: 144 mmol/L (ref 136–145)
Total Bilirubin: 0.6 MG/DL (ref 0.2–1.0)
Total Protein: 7 g/dL (ref 6.4–8.2)

## 2018-05-30 LAB — VITAMIN B12
Vitamin B-12: 590 pg/mL (ref 193–986)
Vitamin B12: 590 pg/mL (ref 193–986)

## 2018-05-30 LAB — T4, FREE
T4 Free: 0.8 NG/DL (ref 0.8–1.5)
T4, Free: 0.8 NG/DL (ref 0.8–1.5)

## 2018-05-30 LAB — IRON AND TIBC
Iron Saturation: 24 % (ref 20–50)
Iron: 61 ug/dL (ref 35–150)
TIBC: 258 ug/dL (ref 250–450)

## 2018-05-30 LAB — METABOLIC PANEL, COMPREHENSIVE
A-G Ratio: 1 — ABNORMAL LOW (ref 1.1–2.2)
ALT (SGPT): 27 U/L (ref 12–78)
AST (SGOT): 21 U/L (ref 15–37)
Albumin: 3.5 g/dL (ref 3.5–5.0)
Alk. phosphatase: 69 U/L (ref 45–117)
Anion gap: 7 mmol/L (ref 5–15)
BUN/Creatinine ratio: 14 (ref 12–20)
BUN: 18 MG/DL (ref 6–20)
Bilirubin, total: 0.6 MG/DL (ref 0.2–1.0)
CO2: 23 mmol/L (ref 21–32)
Calcium: 9.3 MG/DL (ref 8.5–10.1)
Chloride: 114 mmol/L — ABNORMAL HIGH (ref 97–108)
Creatinine: 1.28 MG/DL (ref 0.70–1.30)
GFR est AA: >60 mL/min/{1.73_m2} (ref >60)
GFR est non-AA: 56 mL/min/{1.73_m2} — ABNORMAL LOW (ref >60)
Globulin: 3.5 g/dL (ref 2.0–4.0)
Glucose: 76 mg/dL (ref 65–100)
Potassium: 4.1 mmol/L (ref 3.5–5.1)
Protein, total: 7 g/dL (ref 6.4–8.2)
Sodium: 144 mmol/L (ref 136–145)

## 2018-05-30 LAB — IRON PROFILE
Iron % saturation: 24 % (ref 20–50)
Iron: 61 ug/dL (ref 35–150)
TIBC: 258 ug/dL (ref 250–450)

## 2018-05-30 NOTE — Progress Notes (Signed)
Please add tsh.  Low thyroid function.  Thank you,  Dr. Jamaria Amborn

## 2018-05-31 LAB — TSH 3RD GENERATION
TSH: 1.38 u[IU]/mL (ref 0.36–3.74)
TSH: 1.38 u[IU]/mL (ref 0.36–3.74)

## 2018-06-01 NOTE — Telephone Encounter (Signed)
Unable to reach patient by telephone. Detailed letter mailed to patient. Need to inform patient of the following per Dr. Mliss Sax:    "Dear Elijah [name]:  ??  Please find your most recent results below.   1-kidney function is almost back to normal but it is still lower than your preop level.  We will have to monitor this at least every 2 months for the next 6 months.  Continue to work on staying well-hydrated.  Stay off the lisinopril.  2-the remainder of your labs are acceptable with the exception of a borderline normal thyroid test.  We will need to repeat that in 1 month to see if that has improved or gotten worse.  If it continues to get worse you will need thyroid replacement."

## 2018-06-01 NOTE — Telephone Encounter (Signed)
Pt returned call. Please call back. [phone] (home)

## 2018-06-01 NOTE — Telephone Encounter (Signed)
Spoke with patient and informed him of the following per Dr. Mliss Sax:    "Dear Elijah [name]:  ??  Please find your most recent results below.   1-kidney function is almost back to normal but it is still lower than your preop level. ??We will have to monitor this at least every 2 months for the next 6 months. ??Continue to work on staying well-hydrated. ??Stay off the lisinopril.  2-the remainder of your labs are acceptable with the exception of a borderline normal thyroid test. ??We will need to repeat that in 1 month to see if that has improved or gotten worse. ??If it continues to get worse you will need thyroid replacement."    Patient verbalized understanding.

## 2018-06-02 ENCOUNTER — Ambulatory Visit: Attending: Family Medicine | Primary: Family Medicine

## 2018-06-02 ENCOUNTER — Inpatient Hospital Stay
Admit: 2018-06-02 | Discharge: 2018-06-08 | Disposition: A | Discharge status: Home / Self Care | Payer: MEDICARE | Attending: Family Medicine | Admitting: Family Medicine

## 2018-06-02 ENCOUNTER — Encounter: Admit: 2018-06-02 | Primary: Family Medicine

## 2018-06-02 ENCOUNTER — Ambulatory Visit: Admit: 2018-06-02 | Payer: MEDICARE | Attending: Family Medicine | Primary: Family Medicine

## 2018-06-02 ENCOUNTER — Inpatient Hospital Stay: Admit: 2018-06-03 | Payer: MEDICARE | Primary: Family Medicine

## 2018-06-02 ENCOUNTER — Emergency Department: Admit: 2018-06-02 | Payer: MEDICARE | Primary: Family Medicine

## 2018-06-02 DIAGNOSIS — L97511 Non-pressure chronic ulcer of other part of right foot limited to breakdown of skin: Secondary | ICD-10-CM

## 2018-06-02 DIAGNOSIS — L0390 Cellulitis, unspecified: Secondary | ICD-10-CM | POA: Insufficient documentation

## 2018-06-02 DIAGNOSIS — L089 Local infection of the skin and subcutaneous tissue, unspecified: Secondary | ICD-10-CM | POA: Insufficient documentation

## 2018-06-02 DIAGNOSIS — M86172 Other acute osteomyelitis, left ankle and foot: Principal | ICD-10-CM

## 2018-06-02 LAB — CBC WITH AUTO DIFFERENTIAL
Basophils %: 1 % (ref 0–1)
Basophils Absolute: 0 10*3/uL (ref 0.0–0.1)
Eosinophils %: 3 % (ref 0–7)
Eosinophils Absolute: 0.2 10*3/uL (ref 0.0–0.4)
Granulocyte Absolute Count: 0 10*3/uL (ref 0.00–0.04)
Hematocrit: 33.6 % — ABNORMAL LOW (ref 36.6–50.3)
Hemoglobin: 11.1 g/dL — ABNORMAL LOW (ref 12.1–17.0)
Immature Granulocytes: 0 % (ref 0.0–0.5)
Lymphocytes %: 23 % (ref 12–49)
Lymphocytes Absolute: 1.4 10*3/uL (ref 0.8–3.5)
MCH: 31.6 PG (ref 26.0–34.0)
MCHC: 33 g/dL (ref 30.0–36.5)
MCV: 95.7 FL (ref 80.0–99.0)
MPV: 11.9 FL (ref 8.9–12.9)
Monocytes %: 10 % (ref 5–13)
Monocytes Absolute: 0.6 10*3/uL (ref 0.0–1.0)
NRBC Absolute: 0 10*3/uL (ref 0.00–0.01)
Neutrophils %: 63 % (ref 32–75)
Neutrophils Absolute: 3.7 10*3/uL (ref 1.8–8.0)
Nucleated RBCs: 0 PER 100 WBC
Platelets: 186 10*3/uL (ref 150–400)
RBC: 3.51 M/uL — ABNORMAL LOW (ref 4.10–5.70)
RDW: 16.3 % — ABNORMAL HIGH (ref 11.5–14.5)
WBC: 5.9 10*3/uL (ref 4.1–11.1)

## 2018-06-02 LAB — COMPREHENSIVE METABOLIC PANEL
ALT: 23 U/L (ref 12–78)
AST: 23 U/L (ref 15–37)
Albumin/Globulin Ratio: 0.7 — ABNORMAL LOW (ref 1.1–2.2)
Albumin: 3.1 g/dL — ABNORMAL LOW (ref 3.5–5.0)
Alkaline Phosphatase: 66 U/L (ref 45–117)
Anion Gap: 4 mmol/L — ABNORMAL LOW (ref 5–15)
BUN: 15 MG/DL (ref 6–20)
Bun/Cre Ratio: 14 (ref 12–20)
CO2: 26 mmol/L (ref 21–32)
Calcium: 8.7 MG/DL (ref 8.5–10.1)
Chloride: 109 mmol/L — ABNORMAL HIGH (ref 97–108)
Creatinine: 1.1 MG/DL (ref 0.70–1.30)
EGFR IF NonAfrican American: >60 mL/min/{1.73_m2} (ref >60)
GFR African American: >60 mL/min/{1.73_m2} (ref >60)
Globulin: 4.4 g/dL — ABNORMAL HIGH (ref 2.0–4.0)
Glucose: 113 mg/dL — ABNORMAL HIGH (ref 65–100)
Potassium: 3.8 mmol/L (ref 3.5–5.1)
Sodium: 139 mmol/L (ref 136–145)
Total Bilirubin: 0.8 MG/DL (ref 0.2–1.0)
Total Protein: 7.5 g/dL (ref 6.4–8.2)

## 2018-06-02 LAB — URINALYSIS WITH MICROSCOPIC
BACTERIA, URINE: NEGATIVE /hpf
Bilirubin, Urine: NEGATIVE
Blood, Urine: NEGATIVE
Glucose, Ur: NEGATIVE mg/dL
Ketones, Urine: NEGATIVE mg/dL
Leukocyte Esterase, Urine: NEGATIVE
Nitrite, Urine: NEGATIVE
Protein, UA: NEGATIVE mg/dL
Specific Gravity, UA: <1.005 NA (ref 1.003–1.030)
Urobilinogen, UA, POCT: 1 EU/dL (ref 0.2–1.0)
pH, UA: 6 (ref 5.0–8.0)

## 2018-06-02 LAB — LIPASE
Lipase: 137 U/L (ref 73–393)
Lipase: 137 U/L (ref 73–393)

## 2018-06-02 LAB — LACTIC ACID
Lactic Acid: 1 MMOL/L (ref 0.4–2.0)
Lactic acid: 1 MMOL/L (ref 0.4–2.0)

## 2018-06-02 LAB — TROPONIN: Troponin I: <0.05 ng/mL (ref <0.05)

## 2018-06-02 LAB — URINALYSIS W/MICROSCOPIC
Bacteria: NEGATIVE /hpf
Bilirubin: NEGATIVE
Blood: NEGATIVE
Glucose: NEGATIVE mg/dL
Ketone: NEGATIVE mg/dL
Leukocyte Esterase: NEGATIVE
Nitrites: NEGATIVE
Protein: NEGATIVE mg/dL
Specific gravity: <1.005 (ref 1.003–1.030)
Urobilinogen: 1 EU/dL (ref 0.2–1.0)
pH (UA): 6 (ref 5.0–8.0)

## 2018-06-02 LAB — CBC WITH AUTOMATED DIFF
ABS. BASOPHILS: 0 10*3/uL (ref 0.0–0.1)
ABS. EOSINOPHILS: 0.2 10*3/uL (ref 0.0–0.4)
ABS. IMM. GRANS.: 0 10*3/uL (ref 0.00–0.04)
ABS. LYMPHOCYTES: 1.4 10*3/uL (ref 0.8–3.5)
ABS. MONOCYTES: 0.6 10*3/uL (ref 0.0–1.0)
ABS. NEUTROPHILS: 3.7 10*3/uL (ref 1.8–8.0)
ABSOLUTE NRBC: 0 10*3/uL (ref 0.00–0.01)
BASOPHILS: 1 % (ref 0–1)
EOSINOPHILS: 3 % (ref 0–7)
HCT: 33.6 % — ABNORMAL LOW (ref 36.6–50.3)
HGB: 11.1 g/dL — ABNORMAL LOW (ref 12.1–17.0)
IMMATURE GRANULOCYTES: 0 % (ref 0.0–0.5)
LYMPHOCYTES: 23 % (ref 12–49)
MCH: 31.6 PG (ref 26.0–34.0)
MCHC: 33 g/dL (ref 30.0–36.5)
MCV: 95.7 FL (ref 80.0–99.0)
MONOCYTES: 10 % (ref 5–13)
MPV: 11.9 FL (ref 8.9–12.9)
NEUTROPHILS: 63 % (ref 32–75)
NRBC: 0 PER 100 WBC
PLATELET: 186 10*3/uL (ref 150–400)
RBC: 3.51 M/uL — ABNORMAL LOW (ref 4.10–5.70)
RDW: 16.3 % — ABNORMAL HIGH (ref 11.5–14.5)
WBC: 5.9 10*3/uL (ref 4.1–11.1)

## 2018-06-02 LAB — SAMPLES BEING HELD

## 2018-06-02 LAB — METABOLIC PANEL, COMPREHENSIVE
A-G Ratio: 0.7 — ABNORMAL LOW (ref 1.1–2.2)
ALT (SGPT): 23 U/L (ref 12–78)
AST (SGOT): 23 U/L (ref 15–37)
Albumin: 3.1 g/dL — ABNORMAL LOW (ref 3.5–5.0)
Alk. phosphatase: 66 U/L (ref 45–117)
Anion gap: 4 mmol/L — ABNORMAL LOW (ref 5–15)
BUN/Creatinine ratio: 14 (ref 12–20)
BUN: 15 MG/DL (ref 6–20)
Bilirubin, total: 0.8 MG/DL (ref 0.2–1.0)
CO2: 26 mmol/L (ref 21–32)
Calcium: 8.7 MG/DL (ref 8.5–10.1)
Chloride: 109 mmol/L — ABNORMAL HIGH (ref 97–108)
Creatinine: 1.1 MG/DL (ref 0.70–1.30)
GFR est AA: >60 mL/min/{1.73_m2} (ref >60)
GFR est non-AA: >60 mL/min/{1.73_m2} (ref >60)
Globulin: 4.4 g/dL — ABNORMAL HIGH (ref 2.0–4.0)
Glucose: 113 mg/dL — ABNORMAL HIGH (ref 65–100)
Potassium: 3.8 mmol/L (ref 3.5–5.1)
Protein, total: 7.5 g/dL (ref 6.4–8.2)
Sodium: 139 mmol/L (ref 136–145)

## 2018-06-02 LAB — TROPONIN I: Troponin-I, Qt.: <0.05 ng/mL (ref <0.05)

## 2018-06-02 MED ORDER — CEPHALEXIN 500 MG CAP
500 mg | ORAL_CAPSULE | Freq: Four times a day (QID) | ORAL | 0 refills | Status: DC
Start: 2018-06-02 — End: 2018-06-08

## 2018-06-02 MED ORDER — TRIMETHOPRIM-SULFAMETHOXAZOLE 160 MG-800 MG TAB
160-800 mg | ORAL_TABLET | Freq: Two times a day (BID) | ORAL | 0 refills | Status: DC
Start: 2018-06-02 — End: 2018-06-08

## 2018-06-02 NOTE — ED Notes (Signed)
TRANSFER - OUT REPORT:    Verbal report given to Teresita, RN(name) on Johanthan B Hogen  being transferred to 5th Floor(unit) for routine progression of care       Report consisted of patient???s Situation, Background, Assessment and   Recommendations(SBAR).     Information from the following report(s) SBAR, Kardex, ED Summary, MAR, Accordion and Recent Results was reviewed with the receiving nurse.    Lines:   Peripheral IV 06/02/18 Right Antecubital (Active)   Site Assessment Clean, dry, & intact 06/02/2018 10:00 PM   Phlebitis Assessment 0 06/02/2018 10:00 PM   Infiltration Assessment 0 06/02/2018 10:00 PM   Dressing Status Clean, dry, & intact 06/02/2018 10:00 PM   Dressing Type Transparent 06/02/2018 10:00 PM   Hub Color/Line Status Flushed 06/02/2018 10:00 PM   Action Taken Blood drawn 06/02/2018  5:38 PM   Alcohol Cap Used Yes 06/02/2018 10:00 PM       Peripheral IV 06/02/18 Distal;Left Antecubital (Active)   Site Assessment Clean, dry, & intact 06/02/2018 10:00 PM   Phlebitis Assessment 0 06/02/2018 10:00 PM   Infiltration Assessment 0 06/02/2018 10:00 PM   Dressing Status Clean, dry, & intact 06/02/2018 10:00 PM   Dressing Type Transparent 06/02/2018 10:00 PM   Hub Color/Line Status Pink;Capped 06/02/2018 10:00 PM        Opportunity for questions and clarification was provided.      Patient transported with:   Monitor  Registered Nurse

## 2018-06-02 NOTE — Progress Notes (Addendum)
[facility]   Senior Resident Admission Note        HPI:  Elijah [name] is a 68 y.o. male with h/o severe peripheral neuropathy,OSA (CPAP), HTN HLD, CAD with CABG, Obesity and s/p Gastric Sleeve  who was referred from clinic to the ED with concern for cellulitis of the right foot. He has a wound which started as a cyst about 2-3 weeks ago, then opened up about a week ago followed by erythema, swelling and warmth of the right foot. He was seen in clinic today and was hypotensive with generalized malaise and sent to the ER. See Dr. Valentina Lucks HPI.        Visit Vitals  BP 147/69   Pulse 62   Temp 98 ??F (36.7 ??C)   Resp 20   Ht 6\' 1"  (1.854 m)   Wt 288 lb 12.8 oz (131 kg)   SpO2 99%   BMI 38.10 kg/m??     General  no distress, well developed, well nourished, but feeling malaised  HEENT  oropharynx clear and moist mucous membranes. Laceration right side forehead with dressing on it from fall about a month ago  Respiratory  Clear Breath Sounds Bilaterally, No Increased Effort and Good Air Movement Bilaterally  Cardiovascular   RRR, S1S2, No murmur and Radial/Pedal Pulses 2+/=  Abdomen  soft, non tender and non distended  Skin: redness, warmth and erythema of the right foot, ulcer 2x1 x 0.5 cm as below, bunion, calluses.   Left foot with callus and chronic punctured ulcers2x2 x0.5cm in the lateral aspect of the sole of the left feet.   Good pulses bilaterally 2-3+, but decreased sensation to touch and pain bilaterrally  Musculoskeletal no swelling or tenderness and strength normal and equal bilaterally  Neurology  AAO and CN II - XII grossly intact     Chart reviewed.               A/P:   Cellulitis of the right foot  . Blood cultures,wound culture drawn in ED  - Admit to telemetry  - Vital signs per unit protocol  - Vanc and zosyn   - Sending blood cultures   - podiatry, wound care recs.   - [place] lower extremities to assess for DVT?   - R. foot XR negative for acute osteo, but will attempt an MRI to r/o osteo.( had hip replacement bilaterally)    Hypotension: resolved, but seems to have severe orthostasis likely 2/2 recent bypass surgery. Will check orthostatics. Hold bystolic for now.     Patient seen, examined, and discussed with Dr. Valentina Lucks  (PGY-1). For the remaining assessment and plan of other medical problems please refer to Dr. Jone Baseman H&P for more details.    Pt discussed with on-call attending physician    [name], MD  Family Medicine Resident

## 2018-06-02 NOTE — Progress Notes (Signed)
TRANSFER - IN REPORT:    Verbal report received from Eiei,RN(name) on Elijah [name]  being received from ER(unit) for routine progression of care      Report consisted of patient???s Situation, Background, Assessment and   Recommendations(SBAR).     Information from the following report(s) SBAR and Kardex was reviewed with the receiving nurse.    Opportunity for questions and clarification was provided.      Assessment completed upon patient???s arrival to unit and care assumed.

## 2018-06-02 NOTE — Progress Notes (Signed)
[facility]  [address]  [place], [place]  [phone]           Progress Note    Patient: Elijah [name] MRN: [id number]  SSN: [id number]    Date of Birth: 1950/08/30  Age: 68 y.o.  Sex: male        Chief Complaint   Patient presents with   ??? Wound Check         Subjective:     Encounter Diagnoses   Name Primary?   ??? Skin ulcer of right foot, limited to breakdown of skin [facility]): He called in to be seen today saying that the new ulceration on the right midfoot was draining purulent foul-smelling discharge.  He denies fevers chills or sweats but he just does not feel well.  When he arrived at the office I debrided this area and it is clearly through his skin with minimal drainage right now.  His sock was saturated with drainage so it must have drained prior to arrival.    It was cultured as I initially planned to put him on p.o. antibiotics (based on his most recent left foot ulcer culture).    When we took his blood pressure it fell from 015-61 systolic which means  he has to go to the emergency room for evaluation and consideration for admission.  [place] with [place] one to assist in his care.     Yes   ??? Cellulitis of foot, right: The right foot is red and tender in the arch area.  It is slightly warm to touch.  Since last being seen here he is developed a cellulitis involving his right foot.  This is the same foot in which he had osteomyelitis 2 years ago.  He has a dense polyneuropathy and he cannot feel anything when it touches his feet.  He cannot tell whether or not he stepped on anything.        ??? 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  They said he had a bone spur or abnormal bony growth that would have to be surgically removed to get that ulcer to heal up.  That  ulcer does not appear to be acutely infected.        ??? 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        ??? Orthostatic hypotension: See above.  Severe orthostasis in the setting of someone who is post gastric bypass with recent 70 pound weight loss and is nutritionally on the edge.  I have had to stop all of his blood pressure medicines except his beta-blocker which he is taking for his heart.        ??? Weakness generalized: See above.  He feels awful.  This is multifactorial.  With my encouragement has been drinking more fluids and eating slightly more and has actually gained a couple pounds.        Current and past medical information:    Current Medications after this visit::     Current Outpatient Medications   Medication Sig   ??? trimethoprim-sulfamethoxazole (BACTRIM DS, SEPTRA DS) 160-800 mg per tablet Take 1 Tab by mouth two (2) times a day for 10 days.   ??? cephALEXin (KEFLEX) 500 mg capsule Take 1  Cap by mouth four (4) times daily for 10 days.   ??? BYSTOLIC 10 mg tablet TAKE 1 TABLET BY MOUTH EVERY DAY   ??? DULoxetine (CYMBALTA) 60 mg capsule TAKE 1 CAPSULE BY MOUTH DAILY   ??? pregabalin (LYRICA) 300 mg capsule TAKE 1 CAPSULE BY MOUTH TWICE A DAY   ??? pantoprazole (PROTONIX) 40 mg tablet TAKE 1 TABLET BY MOUTH DAILY AFTER DISCHARGED FROM HOSPITAL   ??? diclofenac (VOLTAREN) 1 % gel Apply 2 g to affected area four (4) times daily. Apply to right knee.   ??? cyanocobalamin (VITAMIN B12) 500 mcg tablet Take 1 Tab by mouth daily.   ??? cholecalciferol (VITAMIN D3) 5,000 unit tab tablet Take 1 Tab by mouth daily.   ??? atorvastatin (LIPITOR) 40 mg tablet Take 80 mg by mouth nightly.   ??? co-enzyme Q-10 (CO Q-10) 100 mg capsule Take 100 mg by mouth nightly.   ??? Flaxseed Oil oil Take 1 Cap by mouth daily.   ??? cpap machine kit by Does Not Apply route.   ??? DOCOSAHEXANOIC ACID/EPA (FISH OIL PO) Take 1 Cap by mouth daily.      No current facility-administered medications for this visit.        Patient Active Problem List    Diagnosis Date Noted   ??? Polyneuropathy 05/28/2016     Priority: 1 - One   ??? Morbid obesity due to excess calories ([place]) 04/24/2015     Priority: 1 - One   ??? Essential hypertension, benign 02/21/2015     Priority: 1 - One   ??? HLD (hyperlipidemia) 02/21/2015     Priority: 1 - One   ??? Coronary atherosclerosis of native coronary artery 02/21/2015     Priority: 1 - One   ??? OSA on CPAP 02/21/2015     Priority: 1 - One   ??? Pure hypercholesterolemia 08/23/2013     Priority: 1 - One   ??? 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 Skin ulcer of right foot, limited to breakdown of skin ([place]) 06/02/2018   ??? 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 Chronic fatigue 09/08/2017   ??? Tubular adenoma of colon 09/23/2015   ??? Benign prostatic hyperplasia 12/29/2006       Past Medical History:   Diagnosis Date   ??? BPH (benign prostatic hyperplasia)    ??? CAD (coronary artery disease)     [place]ABG [place] 5 [place]   ??? History of vascular access device 09/08/2016    [facility] VAT, 5 FR double, R brachial, 48 cm with 1 cm out, LTA   ??? HLD (hyperlipidemia)    ??? HTN (hypertension)    ??? Morbid obesity due to excess calories ([place]) 04/24/2015   ??? OSA (obstructive sleep apnea)    ??? Polyneuropathy    ??? Tubular adenoma of colon     x2 on colonoscopy 09/18/15       Allergies   Allergen Reactions   ??? Metoprolol Other (comments)     Patient states, made me feel horrible.       Past Surgical History:   Procedure Laterality Date   ??? HX HIP REPLACEMENT Bilateral        Social History     Socioeconomic History   ??? Marital status: MARRIED     Spouse name: Not on file   ??? Number of children: Not  on file   ??? Years of education: Not on file   ??? Highest education level: Not on file   Tobacco Use   ??? Smoking status: Former Smoker     Types: Cigarettes    ??? Smokeless tobacco: Never Used   Substance and Sexual Activity   ??? Alcohol use: No     Comment: recently has stopped   ??? Drug use: No       Review of Systems   Constitutional: Positive for malaise/fatigue. Negative for chills, diaphoresis, fever and weight loss.        He feels weak and is somnolent in the office.   HENT: Negative.  Negative for hearing loss.    Eyes: Negative.  Negative for blurred vision and double vision.   Respiratory: Negative.  Negative for cough, sputum production and shortness of breath.    Cardiovascular: Negative.  Negative for chest pain and palpitations.   Gastrointestinal: Negative.  Negative for abdominal pain, blood in stool, heartburn, nausea and vomiting.   Genitourinary: Negative.  Negative for dysuria, frequency and urgency.   Musculoskeletal: Negative.  Negative for back pain, falls, myalgias and neck pain.   Skin: Positive for rash.        Skin ulcers on both feet.  The right foot skin ulcer is new and is draining.   Neurological: Positive for sensory change. Negative for dizziness, tingling, tremors, weakness and headaches.        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  He has multiple toe deformities.  He has 2 active foot ulcers.   Endo/Heme/Allergies: Negative.    Psychiatric/Behavioral: Negative.  Negative for depression.        Objective:     Vitals:    06/02/18 1338 06/02/18 1414   BP: 107/66 (!) 85/56   Pulse: 60    Resp: 20    Temp: 97.6 ??F (36.4 ??C)    TempSrc: Oral    SpO2: 100%    Weight: 290 lb (131.5 kg)    Height: 6' 1" (1.854 m)       Body mass index is 38.26 kg/m??.    Physical Exam  Vitals signs and nursing note reviewed.   Constitutional:       General: He is not in acute distress.     Appearance: Normal appearance. He is well-developed. He is ill-appearing. He is not toxic-appearing.      Comments: He looks ill fatigued and weak   HENT:      Head: Normocephalic and atraumatic.      Nose: No congestion.      Mouth/Throat:       Pharynx: No oropharyngeal exudate or posterior oropharyngeal erythema.   Eyes:      General: No scleral icterus.        Right eye: No discharge.         Left eye: No discharge.   Cardiovascular:      Rate and Rhythm: Normal rate and regular rhythm.      Heart sounds: Normal heart sounds. No murmur. No gallop.    Pulmonary:      Effort: Pulmonary effort is normal. No respiratory distress.      Breath sounds: Normal breath sounds. No wheezing, rhonchi or rales.   Abdominal:      General: There is no distension.      Tenderness: There is no abdominal tenderness. There is no guarding.   Musculoskeletal:  General: Swelling present.      Right lower leg: Edema present.      Comments: He has swelling and redness of his right foot.  His left foot is bandaged with a previous ulceration.  He has an appointment to see his podiatrist next week in [name].   Skin:     General: Skin is warm.      Coloration: Skin is not jaundiced.      Findings: No erythema or rash.       Health Maintenance Due   Topic Date Due   ??? FOBT Q1Y Age 57-75  12/22/1998   ??? Pneumococcal 65+ years (1 of 1 - PPSV23) 12/21/2013   ??? Shingrix Vaccine Age 38> (2 of 2) 05/04/2018         Assessment and orders:     Encounter Diagnoses     ICD-10-CM ICD-9-CM   1. Skin ulcer of right foot, limited to breakdown of skin ([place]) L97.511 707.15   2. Cellulitis of foot, right L03.115 682.7   3. Skin ulcer of left foot, limited to breakdown of skin ([place]) L97.521 707.15   4. Hx of osteomyelitis Z87.39 V13.59   5. Orthostatic hypotension I95.1 458.0   6. Weakness generalized R53.1 780.79     Diagnoses and all orders for this visit:    1. Skin ulcer of right foot, limited to breakdown of skin ([place]) with associated purulent foul-smelling discharge and cellulitis.  Extent of his infection is hard to ascertain.  -     XR FOOT RT AP/LAT; Future    2. Cellulitis of foot, right-above    3. Skin ulcer of left foot, limited to breakdown of skin ([facility])-this was  previously infected but not now is a guy, getting.      4. Hx of osteomyelitis-involving right foot.    5. Orthostatic hypotension-severe associated with generalized weakness fatigue drowsiness.  Multifactorial.  He already has a deformed scar on his right forehead from a fall precipitated by his orthostatic hypotension.    6. Weakness generalized-multifactorial.  His thyroid test is borderline low.  His beta-blockers dosage may be too high for him since he is lost weight.    I planned on treating him initially as an outpatient but he needs evaluation emergency room,observation and consideration for admission since he is feeling this badly and has acute infection and severe orthostasis.  His left foot infection previously sensitive to Bactrim.  His wounds are usually polymicrobial infections.    These are the 2 antimicrobials I had planned to use.  -     trimethoprim-sulfamethoxazole (BACTRIM DS, SEPTRA DS) 160-800 mg per tablet; Take 1 Tab by mouth two (2) times a day for 10 days.  -     cephALEXin (KEFLEX) 500 mg capsule; Take 1 Cap by mouth four (4) times daily for 10 days.    Plan of care:  Discussed diagnoses in detail with patient.     Medication risks/benefits/side effects discussed with patient.     All of the patient's questions were addressed. The patient understands and agrees with our plan of care.    The patient knows to call back if they are unsure of or forget any changes we discussed today or if the symptoms change.     The patient received an After-Visit Summary which contains VS, orders, medication list and allergy list. This can be used as a "mini-medical record" should they have to seek medical care while out of town.  Patient Care Team:  [name], MD as PCP - General (Family Practice)  Donalda Ewings Neale Burly, MD as PCP - [facility] Empaneled Provider  Cammy Copa, MD (Cardiology)  Verdene Lennert, MD (Orthopedic Surgery)  Beola Cord., MD (Orthopedic Surgery)   [name], Patrick E, DPM (Podiatry)  Valetta Close, DO as Hospitalist (Infectious Diseases)  Rinaldo Cloud, MD (Orthopedic Surgery)          Signed By: [name], MD     June 02, 2018      ATTENTION:   This medical record was transcribed using an electronic medical records/speech recognition system.  Although proofread, it may and can contain electronic, spelling and other errors.  Corrections may be executed at a later time.  Please feel free to contact me for any clarifications as needed.

## 2018-06-02 NOTE — ED Provider Notes (Addendum)
I have evaluated the patient as the Provider in Triage. I have reviewed His vital signs and the triage nurse assessment. I have talked with the patient and any available family and advised that I am the provider in triage and have ordered the appropriate study to initiate their work up based on the clinical presentation during my assessment.  I have advised that the patient will be accommodated in the Main ED as soon as possible.  I have also requested to contact the triage nurse or myself immediately if the patient experiences any changes in their condition during this brief waiting period.    Pt reports he has an infection in his right foot that is bleeding. He also says his BP is low. He had bariatric surgery in 01/2018 and notes he has felt fatigued, and more confused since. He denies dizziness, shortness of breath, fever, chills, nausea, vomiting.     Note written by Purnell Shoemaker, Scribe, as dictated by Juanda Chance, NP 5:00 PM  ------------------------------------------------------------------------------          The patient was referred in by his PCP because of an infection of his right foot.  When he was seen today he also developed an episode of hypotension.  The patient also complains of generalized fatigue for the past 2 weeks.  He denies fever, vomiting, shortness of breath or other complaints.    Past Medical History:   Diagnosis Date   ??? BPH (benign prostatic hyperplasia)    ??? CAD (coronary artery disease)     CABG x 5 2011[place]ke [place]   ??? History of vascular access device 09/08/2016    [facility] VAT, 5 FR double, R brachial, 48 cm with 1 cm out, LTA   ??? HLD (hyperlipidemia)    ??? HTN (hypertension)    ??? Morbid obesity due to excess calories ([facility]) 04/24/2015   ??? OSA (obstructive sleep apnea)    ??? Polyneuropathy    ??? Tubular adenoma of colon     x2 on colonoscopy 09/18/15       Past Surgical History:   Procedure Laterality Date    ??? HX HIP REPLACEMENT Bilateral          Family History:   Problem Relation Age of Onset   ??? Stroke Mother    ??? Other Father         mrsa infxn caused death   ??? Heart Attack Brother        Social History     Socioeconomic History   ??? Marital status: MARRIED     Spouse name: Not on file   ??? Number of children: Not on file   ??? Years of education: Not on file   ??? Highest education level: Not on file   Occupational History   ??? Not on file   Social Needs   ??? Financial resource strain: Not on file   ??? Food insecurity:     Worry: Not on file     Inability: Not on file   ??? Transportation needs:     Medical: Not on file     Non-medical: Not on file   Tobacco Use   ??? Smoking status: Former Smoker     Types: Cigarettes   ??? Smokeless tobacco: Never Used   Substance and Sexual Activity   ??? Alcohol use: No     Comment: recently has stopped   ??? Drug use: No   ???  Sexual activity: Not on file   Lifestyle   ??? Physical activity:     Days per week: Not on file     Minutes per session: Not on file   ??? Stress: Not on file   Relationships   ??? Social connections:     Talks on phone: Not on file     Gets together: Not on file     Attends religious service: Not on file     Active member of club or organization: Not on file     Attends meetings of clubs or organizations: Not on file     Relationship status: Not on file   ??? Intimate partner violence:     Fear of current or ex partner: Not on file     Emotionally abused: Not on file     Physically abused: Not on file     Forced sexual activity: Not on file   Other Topics Concern   ??? Not on file   Social History Narrative   ??? Not on file         ALLERGIES: Metoprolol    Review of Systems   Constitutional: Positive for fatigue. Negative for fever.   Eyes: Negative for visual disturbance.   Respiratory: Negative for cough, shortness of breath and wheezing.    Cardiovascular: Negative for chest pain and leg swelling.   Gastrointestinal: Negative for abdominal pain, diarrhea, nausea and vomiting.    Genitourinary: Negative for dysuria.   Musculoskeletal: Negative.  Negative for back pain and neck stiffness.   Skin: Positive for color change and wound. Negative for rash.   Neurological: Negative.  Negative for syncope and headaches.   Psychiatric/Behavioral: Negative for confusion.   All other systems reviewed and are negative.      There were no vitals filed for this visit.         Physical Exam  Constitutional:       General: He is not in acute distress.     Appearance: He is well-developed.   HENT:      Head: Normocephalic.   Eyes:      Pupils: Pupils are equal, round, and reactive to light.   Neck:      Musculoskeletal: Normal range of motion.   Cardiovascular:      Rate and Rhythm: Normal rate.      Heart sounds: No murmur.   Pulmonary:      Effort: Pulmonary effort is normal.      Breath sounds: Normal breath sounds.   Abdominal:      Palpations: Abdomen is soft.      Tenderness: There is no abdominal tenderness.   Musculoskeletal: Normal range of motion.   Skin:     General: Skin is warm and dry.      Capillary Refill: Capillary refill takes less than 2 seconds.      Comments: The right foot has an ulcerated area on the plantar surface with a foul-smelling discharge.  The foot is slightly edematous and red/warm.  Pulses are intact.   Neurological:      Mental Status: He is alert.   Psychiatric:         Behavior: Behavior normal.          MDM       Procedures      Hospitalist Perfect Serve for Admission  6:59 PM    ED Room Number: ER20/20  Patient Name and age:  Elijah [name] 68 y.o.  male  Working Diagnosis:   1. Cellulitis, unspecified cellulitis site    2. Hypotension, unspecified hypotension type      Readmission: no  Isolation Requirements:  no  Recommended Level of Care:  med/surg  Code Status:  Full Code  Department:[facility] - [phone]  Other:

## 2018-06-02 NOTE — H&P (Addendum)
[place] [address]   Office [phone]  Fax [phone]       Admission H&P     Name: Elijah [name] MRN: [id number]  Sex: Male   Date of Birth: [birth year]  Age: 68 y.o.  PCP: [name], MD     Source of Information: patient, medical records    Chief complaint: Rt foot cellulitis     History of Present Illness  Elijah [name] is a 68 y.o. male with known polyneuropathy, OSAS (CPAP), HTN HLD, CAD with CABG x5 in 2011, Obesity and s/p Gastric Sleeve in 01/2018  who presents to the ER complaining of Rt foot pain. He noticed a "cyst" 2 weeks ago.  A week after that it appeared to be punctured and started bleeding.  Denies No f/c, diaphoresis, n/v/d, chest pain, SOB, palpitations, dysuria, constipation, pain in lower extremities.  Endorses some swelling and redness.  Pt does not recall injuring his foot and usually wears his shoes all the time except when he is in his bedroom. He has not taken an antibiotics for this.       Also endorses significant fatigue x1 month.  States it has been present since he had Gastric Sleeve but feels that it has worsened in the past month.  He notes dizziness upon standing for the past month and had x1 fall 2/2 to this in which he hit his Rt forehead and required ~20 stiches.        In the Er:   vital signs were unremarkable   Labs were remarkable for elevated CRP to 5.4 and ESR elevated to 58.   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 No  acute fracture identified..      Patient Vitals for the past 12 hrs:   Temp Pulse Resp BP SpO2   06/02/18 1900 ??? ??? ??? 117/69 96 %   06/02/18 1845 ??? ??? ??? 116/64 (!) 76 %   06/02/18 1704 98 ??F (36.7 ??C) 62 20 118/66 100 %          Past Medical History:   Diagnosis Date   ??? BPH (benign prostatic hyperplasia)    ??? CAD (coronary artery disease)     CABG x 5 2011- [place] with [place] basal inferior [place]   ??? History of vascular access device 09/08/2016     [facility] VAT, 5 FR double, R brachial, 48 cm with 1 cm out, LTA   ??? HLD (hyperlipidemia)    ??? HTN (hypertension)    ??? Morbid obesity due to excess calories ([place]) 04/24/2015   ??? OSA (obstructive sleep apnea)    ??? Polyneuropathy    ??? Tubular adenoma of colon     x2 on colonoscopy 09/18/15        Home Medications   Prior to Admission medications    Medication Sig Start Date End Date Taking? Authorizing Provider   trimethoprim-sulfamethoxazole (BACTRIM DS, SEPTRA DS) 160-800 mg per tablet Take 1 Tab by mouth two (2) times a day for 10 days. 06/02/18 06/12/18  [name], MD   cephALEXin (KEFLEX) 500 mg capsule Take 1 Cap by mouth four (4) times daily for 10 days. 06/02/18 06/12/18  [name], MD   BYSTOLIC 10 mg tablet TAKE 1 TABLET BY MOUTH EVERY DAY 05/19/18   [name], MD   DULoxetine (CYMBALTA) 60 mg capsule TAKE  1 CAPSULE BY MOUTH DAILY 05/19/18   [name], MD   pregabalin (LYRICA) 300 mg capsule TAKE 1 CAPSULE BY MOUTH TWICE A DAY 05/19/18   [name], MD   pantoprazole (PROTONIX) 40 mg tablet TAKE 1 TABLET BY MOUTH DAILY AFTER DISCHARGED FROM HOSPITAL 02/06/18   Provider, Historical   diclofenac (VOLTAREN) 1 % gel Apply 2 g to affected area four (4) times daily. Apply to right knee. 04/05/18   [name], MD   lisinopril (PRINIVIL, ZESTRIL) 20 mg tablet TAKE 1 TABLET BY MOUTH DAILY  Patient taking differently: 10 mg. TAKE 1 TABLET BY MOUTH DAILY 12/08/17 06/02/18  Lillie Fragmin, MD   cyanocobalamin (VITAMIN B12) 500 mcg tablet Take 1 Tab by mouth daily. 09/15/17   Vernia Buff, MD   cholecalciferol (VITAMIN D3) 5,000 unit tab tablet Take 1 Tab by mouth daily. 09/15/17   Vernia Buff, MD   atorvastatin (LIPITOR) 40 mg tablet Take 80 mg by mouth nightly.    Provider, Historical   co-enzyme Q-10 (CO Q-10) 100 mg capsule Take 100 mg by mouth nightly.    Provider, Historical   Flaxseed Oil oil Take 1 Cap by mouth daily.    Provider, Historical    cpap machine kit by Does Not Apply route.    Provider, Historical   DOCOSAHEXANOIC ACID/EPA (FISH OIL PO) Take 1 Cap by mouth daily.    Provider, Historical       Allergies  Allergies   Allergen Reactions   ??? Metoprolol Other (comments)     Patient states, made me feel horrible.       Past Medical History:   Diagnosis Date   ??? BPH (benign prostatic hyperplasia)    ??? CAD (coronary artery disease)     CABG x 5 2011- [place]ke with fixed basal inferior [place]   ??? History of vascular access device 09/08/2016    [facility] VAT, 5 FR double, R brachial, 48 cm with 1 cm out, LTA   ??? HLD (hyperlipidemia)    ??? HTN (hypertension)    ??? Morbid obesity due to excess calories ([place]) 04/24/2015   ??? OSA (obstructive sleep apnea)    ??? Polyneuropathy    ??? Tubular adenoma of colon     x2 on colonoscopy 09/18/15       Past Surgical History:   Procedure Laterality Date   ??? HX HIP REPLACEMENT Bilateral        Family History   Problem Relation Age of Onset   ??? Stroke Mother    ??? Other Father         mrsa infxn caused death   ??? Heart Attack Brother        Social History  Social History     Socioeconomic History   ??? Marital status: MARRIED     Spouse name: Not on file   ??? Number of children: Not on file   ??? Years of education: Not on file   ??? Highest education level: Not on file   Occupational History   ??? Not on file   Social Needs   ??? Financial resource strain: Not on file   ??? Food insecurity:     Worry: Not on file     Inability: Not on file   ??? Transportation needs:     Medical: Not on file     Non-medical: Not on file   Tobacco Use   ??? Smoking status:  Former Smoker     Types: Cigarettes   ??? Smokeless tobacco: Never Used   Substance and Sexual Activity   ??? Alcohol use: No     Comment: recently has stopped   ??? Drug use: No   ??? Sexual activity: Not on file   Lifestyle   ??? Physical activity:     Days per week: Not on file     Minutes per session: Not on file   ??? Stress: Not on file   Relationships   ??? Social connections:      Talks on phone: Not on file     Gets together: Not on file     Attends religious service: Not on file     Active member of club or organization: Not on file     Attends meetings of clubs or organizations: Not on file     Relationship status: Not on file   ??? Intimate partner violence:     Fear of current or ex partner: Not on file     Emotionally abused: Not on file     Physically abused: Not on file     Forced sexual activity: Not on file   Other Topics Concern   ??? Not on file   Social History Narrative   ??? Not on file       Alcohol history: Not at all  Smoking history: quit 30 years ago   Illicit drug history: Not at all    Living arrangement: patient lives with their spouse.  Ambulates: With [name]    Review of Systems:   Review of Systems   Constitutional: Positive for fatigue. Negative for activity change, appetite change, chills, diaphoresis, fever and unexpected weight change.   HENT: Negative for congestion, sore throat and trouble swallowing.    Eyes: Negative for visual disturbance.   Respiratory: Negative for cough, shortness of breath and wheezing.    Cardiovascular: Negative for chest pain.   Gastrointestinal: Negative for abdominal pain, constipation, diarrhea, nausea and vomiting.   Genitourinary: Negative for dysuria and hematuria.   Musculoskeletal: Negative for arthralgias and myalgias.   Skin: Positive for wound. Negative for rash.   Neurological: Negative for dizziness and headaches.   Psychiatric/Behavioral: Negative for confusion.        Physical Exam      O2 Device: Room air   General: No acute distress. Alert. Cooperative.   Head: Normocephalic.  Healing scalp wound to Rt forehead with small amount of drainage.     Eyes:              Conjunctiva pink. Sclera white. PERRL.   Ears:              Ear canals patent. TM non-erythematous.   Nose:             Septum midline. Mucosa pink. No drainage.   Throat: Mucosa pink. Moist mucous membranes. No tonsillar exudates or  erythema. Palate movement equal bilaterally.   Neck: Supple. Normal ROM. No stiffness.   Respiratory: CTAB. No w/r/r/c.   Cardiovascular: RRR. Normal S1,S2. No m/r/g. Pulses 2+ throughout.   GI: + bowel sounds. Nontender. No rebound tenderness or guarding. Nondistended.   Extremities: Non pitting BL LE edema and erythema and slight warmth to upper ankle of Rt foot. Distal pulses intact bilaterally    Musculoskeletal: Full ROM in all extremities.    Skin: Warm and dry, no rashes. Small 1-2cm wound to plantar aspect Rt medial foot with presence  of drainage on dressing.  (see photos of new Rt foot wound and chronic Lt foot wound below)    Neuro: CN II-XII grossly intact. Strength 5/5 in all extremities.      RT FOOT           Left FOOT                 Laboratory Data  Recent Results (from the past 8 hour(s))   CBC WITH AUTOMATED DIFF    Collection Time: 06/02/18  5:39 PM   Result Value Ref Range    WBC 5.9 4.1 - 11.1 K/uL    RBC 3.51 (L) 4.10 - 5.70 M/uL    HGB 11.1 (L) 12.1 - 17.0 g/dL    HCT 33.6 (L) 36.6 - 50.3 %    MCV 95.7 80.0 - 99.0 FL    MCH 31.6 26.0 - 34.0 PG    MCHC 33.0 30.0 - 36.5 g/dL    RDW 16.3 (H) 11.5 - 14.5 %    PLATELET 186 150 - 400 K/uL    MPV 11.9 8.9 - 12.9 FL    NRBC 0.0 0 PER 100 WBC    ABSOLUTE NRBC 0.00 0.00 - 0.01 K/uL    NEUTROPHILS 63 32 - 75 %    LYMPHOCYTES 23 12 - 49 %    MONOCYTES 10 5 - 13 %    EOSINOPHILS 3 0 - 7 %    BASOPHILS 1 0 - 1 %    IMMATURE GRANULOCYTES 0 0.0 - 0.5 %    ABS. NEUTROPHILS 3.7 1.8 - 8.0 K/UL    ABS. LYMPHOCYTES 1.4 0.8 - 3.5 K/UL    ABS. MONOCYTES 0.6 0.0 - 1.0 K/UL    ABS. EOSINOPHILS 0.2 0.0 - 0.4 K/UL    ABS. BASOPHILS 0.0 0.0 - 0.1 K/UL    ABS. IMM. GRANS. 0.0 0.00 - 0.04 K/UL    DF AUTOMATED     METABOLIC PANEL, COMPREHENSIVE    Collection Time: 06/02/18  5:39 PM   Result Value Ref Range    Sodium 139 136 - 145 mmol/L    Potassium 3.8 3.5 - 5.1 mmol/L    Chloride 109 (H) 97 - 108 mmol/L    CO2 26 21 - 32 mmol/L    Anion gap 4 (L) 5 - 15 mmol/L     Glucose 113 (H) 65 - 100 mg/dL    BUN 15 6 - 20 MG/DL    Creatinine 1.10 0.70 - 1.30 MG/DL    BUN/Creatinine ratio 14 12 - 20      GFR est AA >60 >60 ml/min/1.52m    GFR est non-AA >60 >60 ml/min/1.73m   Calcium 8.7 8.5 - 10.1 MG/DL    Bilirubin, total 0.8 0.2 - 1.0 MG/DL    ALT (SGPT) 23 12 - 78 U/L    AST (SGOT) 23 15 - 37 U/L    Alk. phosphatase 66 45 - 117 U/L    Protein, total 7.5 6.4 - 8.2 g/dL    Albumin 3.1 (L) 3.5 - 5.0 g/dL    Globulin 4.4 (H) 2.0 - 4.0 g/dL    A-G Ratio 0.7 (L) 1.1 - 2.2     LACTIC ACID    Collection Time: 06/02/18  5:39 PM   Result Value Ref Range    Lactic acid 1.0 0.4 - 2.0 MMOL/L   LIPASE    Collection Time: 06/02/18  5:39 PM   Result Value Ref Range  Lipase 137 73 - 393 U/L   TROPONIN I    Collection Time: 06/02/18  5:39 PM   Result Value Ref Range    Troponin-I, Qt. <0.05 <0.05 ng/mL   SAMPLES BEING HELD    Collection Time: 06/02/18  5:39 PM   Result Value Ref Range    SAMPLES BEING HELD [email or web address]     COMMENT        Add-on orders for these samples will be processed based on acceptable specimen integrity and analyte stability, which may vary by analyte.   URINALYSIS W/MICROSCOPIC    Collection Time: 06/02/18  6:19 PM   Result Value Ref Range    Color YELLOW/STRAW      Appearance CLEAR CLEAR      Specific gravity <1.005 1.003 - 1.030    pH (UA) 6.0 5.0 - 8.0      Protein NEGATIVE  NEG mg/dL    Glucose NEGATIVE  NEG mg/dL    Ketone NEGATIVE  NEG mg/dL    Bilirubin NEGATIVE  NEG      Blood NEGATIVE  NEG      Urobilinogen 1.0 0.2 - 1.0 EU/dL    Nitrites NEGATIVE  NEG      Leukocyte Esterase NEGATIVE  NEG      WBC 0-4 0 - 4 /hpf    RBC 0-5 0 - 5 /hpf    Epithelial cells FEW FEW /lpf    Bacteria NEGATIVE  NEG /hpf       Imaging  CXR Results  (Last 48 hours)    None        CT Results  (Last 48 hours)    None              Assessment and Plan     Elijah [name] is a 68 y.o. male who is admitted for Rt foot wound with  known polyneuropathy, OSAS (CPAP), HTN HLD, CAD with CABG x5 in 2011, Obesity and s/p Gastric Sleeve in 01/2018 .    ACUTE PROBLEMS   Rt foot wound- h/o osteo involving Rt foot, continues to follow with podiatry at [facility]. x2 weeks of painful sore on Rt foot.  No abx given outpatient.  POA elevated CRP to 5.4 and ESR elevated to 58. LA wnl, XR- no fx and no osteo.   -Admit to tele   -Vitals per unit routine   -I&O, NPO at midnight, start MIVF at 123m/hr   - Start Vanc and Zosyn for broad spectrum coverage   -f/u blood and wound cx   -daily CBC/CMP/mag/phos   -f/u MRI foot   -d/u BLE doppler   -given 1 dose Lovenox '40mg'$  at 2000.  Will hold at midnight until seen by Podiatry.  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  BP in PCP office today 107/66..Marland KitchenPOA BP  118/66.  Iron profile, TSH and B12 from 3/2 wnl.   -will hold home Bystolic '10mg'$  daily   -f/u orthostatics   -f/u echo   -daily CBC/CMP/mag/phos   -Will continue to monitor at this time and readjust as BP's trend.    CHRONIC PROBLEMS     S/p Gastric Sleeve: 70 lb wt loss since 11/19   -continue home B complex   -continue home B12  Hyperlipidemia: Last lipid panel on 1/20 and values were TC- 118, TG- 119,  HDL-41, LDL-53,   -Continue home Lipitor 13m daily     Polyneuropathy: chronic bilateral below the knee   -continue home Lyrica 3064mbid   -continue home Cymbalta 6058mnce daily     Sleep Apnea:   -continue CPAP qhs     Obesity BMI Body mass index is 38.1 kg/m??.  - Encouraging lifestyle modifications and further follow up outpatient.       FEN/GI - NPO after midnight. NS at 150 mL/hr.  Activity - Ambulate as tolerated  DVT prophylaxis - SCDs until after podiatry see's Pt.  If no surgery then start Lovenox   GI prophylaxis - Not indicated at this time   Fall prophylaxis - Fall precautions ordered.  Disposition - Admit to Remote Telemetry. Plan to d/c to Home. Consulting PT/OT/CM  Code Status - Full, discussed with patient / caregivers.  Next of Kin Name and Contact Wife MarJacqulyn [phone] Patient HowSTARK AGUINAGAll be discussed Dr. PowPrince Rome   7:16 PM, 06/02/18  KatLovey NewcomerO  Family Medicine Resident       For Billing    Chief Complaint   Patient presents with   ??? Fatigue   ??? Foot Problem       Hospital Problems  Date Reviewed: 06/02/2018    None

## 2018-06-02 NOTE — Progress Notes (Signed)
1. Have you been to the ER, urgent care clinic since your last visit?  Hospitalized since your last visit?no      2. Have you seen or consulted any other health care providers outside of the [facility] since your last visit?  Include any pap smears or colon screening. No    Reviewed record in preparation for visit and have necessary documentation  Goals that were addressed and/or need to be completed during or after this appointment include     Health Maintenance Due   Topic Date Due   ??? FOBT Q1Y Age 50-75  12/22/1998   ??? Pneumococcal 65+ years (1 of 1 - PPSV23) 12/21/2013   ??? Shingrix Vaccine Age 50> (2 of 2) 05/04/2018       Patient is accompanied by self I have received verbal consent from Elijah [name] to discuss any/all medical information while they are present in the room.

## 2018-06-02 NOTE — ED Notes (Signed)
Patient's HR not in 20, data error. Patient's HR 55

## 2018-06-02 NOTE — Progress Notes (Signed)
[facility]: MED RECONCILIATION      Information obtained from: Patient     Allergies: Metoprolol    Prior to Admission Medications:     Medication Documentation Review Audit       Reviewed by Tia Alert, PHARMD (Pharmacist) on 06/02/18 at 2120      Medication Sig Documenting Provider Last Dose Status Taking?   atorvastatin (LIPITOR) 80 mg tablet Take 80 mg by mouth daily. Provider, Historical 06/02/2018 Active Yes   b complex vitamins tablet Take 1 Tab by mouth daily. Provider, Historical 06/02/2018 Active Yes   BYSTOLIC 10 mg tablet TAKE 1 TABLET BY MOUTH EVERY DAY [name], MD 06/02/2018 Active Yes   cephALEXin (KEFLEX) 500 mg capsule Take 1 Cap by mouth four (4) times daily for 10 days. [name], MD NEW RX - Not started yet Active No   co-enzyme Q-10 (CO Q-10) 100 mg capsule Take 100 mg by mouth daily. Provider, Historical 06/02/2018 Active Yes   cpap machine kit by Does Not Apply route. Provider, Historical  Active Yes   cyanocobalamin (VITAMIN B12) 500 mcg tablet Take 1 Tab by mouth daily. Vernia Buff, MD 06/02/2018 Active Yes   diclofenac (VOLTAREN) 1 % gel Apply 2 g to affected area four (4) times daily as needed for Pain (Knee pain). Provider, Historical  Active Yes   DULoxetine (CYMBALTA) 60 mg capsule TAKE 1 CAPSULE BY MOUTH DAILY [name], MD 06/02/2018 Active Yes   Alphia Moh, [place] children's chewable vitamins - 2 tablets daily Provider, Historical 06/02/2018 Active Yes   pregabalin (LYRICA) 300 mg capsule TAKE 1 CAPSULE BY MOUTH TWICE A DAY [name], MD 06/02/2018 AM Active Yes   trimethoprim-sulfamethoxazole (BACTRIM DS, SEPTRA DS) 160-800 mg per tablet Take 1 Tab by mouth two (2) times a day for 10 days. [name], MD NEW RX - Has not started yet Active No                        [place], PHARMD   Contact: [phone]

## 2018-06-02 NOTE — ED Notes (Signed)
Verbal shift change report given to Ei Ei RN (oncoming nurse) by Matthew RN (offgoing nurse). Report included the following information SBAR, Kardex, ED Summary, MAR and Recent Results.

## 2018-06-02 NOTE — ED Triage Notes (Signed)
Pt was referred to ER for wound to his right foot and low BP.

## 2018-06-02 NOTE — Progress Notes (Signed)
 TRANSFER - IN REPORT:    Verbal report received from Eiei,RN(name) on Elijah [name]  being received from ER(unit) for routine progression of care      Report consisted of patient's Situation, Background, Assessment and   Recommendations(SBAR).     Information from the following report(s) SBAR and Kardex was reviewed with the receiving nurse.    Opportunity for questions and clarification was provided.      Assessment completed upon patient's arrival to unit and care assumed.

## 2018-06-02 NOTE — Progress Notes (Signed)
[facility]  [address]  [place], [place]  [phone]           Progress Note    Patient: Elijah [name] MRN: [id number]  SSN: [id number]    Date of Birth: [birth year]  Age: 68 y.o.  Sex: male        Chief Complaint   Patient presents with   ??? Wound Check         Subjective:     Encounter Diagnoses   Name Primary?   ??? Skin ulcer of right foot, limited to breakdown of skin [facility]): He called in to be seen today saying that the new ulceration on the right midfoot was draining purulent foul-smelling discharge.  He denies fevers chills or sweats but he just does not feel well.  When he arrived at the office I debrided this area and it is clearly through his skin with minimal drainage right now.  His sock was saturated with drainage so it must have drained prior to arrival.    It was cultured as I initially planned to put him on p.o. antibiotics (based on his most recent left foot ulcer culture).    When we took his blood pressure it fell from 485-46 systolic which means  he has to go to the emergency room for evaluation and consideration for admission.  [place] with [place] one to assist in his care.     Yes   ??? Cellulitis of foot, right: The right foot is red and tender in the arch area.  It is slightly warm to touch.  Since last being seen here he is developed a cellulitis involving his right foot.  This is the same foot in which he had osteomyelitis 2 years ago.  He has a dense polyneuropathy and he cannot feel anything when it touches his feet.  He cannot tell whether or not he stepped on anything.        ??? Skin ulcer of left foot, limited to breakdown of skin [facility]): He has had a chronic skin ulcer over the left foot for which he is seeing podiatry in [place].  They said he had a bone spur or abnormal bony growth that would have to be surgically removed to get that ulcer to heal up.  That ulcer does not appear to be acutely  infected.        ??? 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        ??? Orthostatic hypotension: See above.  Severe orthostasis in the setting of someone who is post gastric bypass with recent 70 pound weight loss and is nutritionally on the edge.  I have had to stop all of his blood pressure medicines except his beta-blocker which he is taking for his heart.        ??? Weakness generalized: See above.  He feels awful.  This is multifactorial.  With my encouragement has been drinking more fluids and eating slightly more and has actually gained a couple pounds.        Current and past medical information:    Current Medications after this visit::     Current Outpatient Medications   Medication Sig   ??? trimethoprim-sulfamethoxazole (BACTRIM DS, SEPTRA DS) 160-800 mg per tablet Take 1 Tab by mouth two (2) times a day for 10 days.   ??? cephALEXin (KEFLEX) 500 mg capsule Take 1  Cap by mouth four (4) times daily for 10 days.   ??? BYSTOLIC 10 mg tablet TAKE 1 TABLET BY MOUTH EVERY DAY   ??? DULoxetine (CYMBALTA) 60 mg capsule TAKE 1 CAPSULE BY MOUTH DAILY   ??? pregabalin (LYRICA) 300 mg capsule TAKE 1 CAPSULE BY MOUTH TWICE A DAY   ??? pantoprazole (PROTONIX) 40 mg tablet TAKE 1 TABLET BY MOUTH DAILY AFTER DISCHARGED FROM HOSPITAL   ??? diclofenac (VOLTAREN) 1 % gel Apply 2 g to affected area four (4) times daily. Apply to right knee.   ??? cyanocobalamin (VITAMIN B12) 500 mcg tablet Take 1 Tab by mouth daily.   ??? cholecalciferol (VITAMIN D3) 5,000 unit tab tablet Take 1 Tab by mouth daily.   ??? atorvastatin (LIPITOR) 40 mg tablet Take 80 mg by mouth nightly.   ??? co-enzyme Q-10 (CO Q-10) 100 mg capsule Take 100 mg by mouth nightly.   ??? Flaxseed Oil oil Take 1 Cap by mouth daily.   ??? cpap machine kit by Does Not Apply route.   ??? DOCOSAHEXANOIC ACID/EPA (FISH OIL PO) Take 1 Cap by mouth daily.     No current facility-administered medications for this visit.        Patient Active Problem  List    Diagnosis Date Noted   ??? Polyneuropathy 05/28/2016     Priority: 1 - One   ??? Morbid obesity due to excess calories ([place]) 04/24/2015     Priority: 1 - One   ??? Essential hypertension, benign 02/21/2015     Priority: 1 - One   ??? HLD (hyperlipidemia) 02/21/2015     Priority: 1 - One   ??? Coronary atherosclerosis of native coronary artery 02/21/2015     Priority: 1 - One   ??? OSA on CPAP 02/21/2015     Priority: 1 - One   ??? Pure hypercholesterolemia 08/23/2013     Priority: 1 - One   ??? 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 Skin ulcer of right foot, limited to breakdown of skin ([place]) 06/02/2018   ??? 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 Chronic fatigue 09/08/2017   ??? Tubular adenoma of colon 09/23/2015   ??? Benign prostatic hyperplasia 12/29/2006       Past Medical History:   Diagnosis Date   ??? BPH (benign prostatic hyperplasia)    ??? CAD (coronary artery disease)     CABG x 5 2011- [place] with [facility] basal inferior [facility]   ??? History of vascular access device 09/08/2016    [facility] VAT, 5 FR double, R brachial, 48 cm with 1 cm out, LTA   ??? HLD (hyperlipidemia)    ??? HTN (hypertension)    ??? Morbid obesity due to excess calories ([place]) 04/24/2015   ??? OSA (obstructive sleep apnea)    ??? Polyneuropathy    ??? Tubular adenoma of colon     x2 on colonoscopy 09/18/15       Allergies   Allergen Reactions   ??? Metoprolol Other (comments)     Patient states, made me feel horrible.       Past Surgical History:   Procedure Laterality Date   ??? HX HIP REPLACEMENT Bilateral        Social History     Socioeconomic History   ??? Marital status: MARRIED     Spouse name: Not on file   ??? Number of children: Not  on file   ??? Years of education: Not on file   ??? Highest education level: Not on file   Tobacco Use   ??? Smoking status: Former Smoker     Types: Cigarettes   ??? Smokeless tobacco: Never Used   Substance and Sexual Activity   ??? Alcohol use: No     Comment: recently has stopped    ??? Drug use: No       Review of Systems   Constitutional: Positive for malaise/fatigue. Negative for chills, diaphoresis, fever and weight loss.        He feels weak and is somnolent in the office.   HENT: Negative.  Negative for hearing loss.    Eyes: Negative.  Negative for blurred vision and double vision.   Respiratory: Negative.  Negative for cough, sputum production and shortness of breath.    Cardiovascular: Negative.  Negative for chest pain and palpitations.   Gastrointestinal: Negative.  Negative for abdominal pain, blood in stool, heartburn, nausea and vomiting.   Genitourinary: Negative.  Negative for dysuria, frequency and urgency.   Musculoskeletal: Negative.  Negative for back pain, falls, myalgias and neck pain.   Skin: Positive for rash.        Skin ulcers on both feet.  The right foot skin ulcer is new and is draining.   Neurological: Positive for sensory change. Negative for dizziness, tingling, tremors, weakness and headaches.        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  He has multiple toe deformities.  He has 2 active foot ulcers.   Endo/Heme/Allergies: Negative.    Psychiatric/Behavioral: Negative.  Negative for depression.        Objective:     Vitals:    06/02/18 1338 06/02/18 1414   BP: 107/66 (!) 85/56   Pulse: 60    Resp: 20    Temp: 97.6 ??F (36.4 ??C)    TempSrc: Oral    SpO2: 100%    Weight: 290 lb (131.5 kg)    Height: 6' 1" (1.854 m)       Body mass index is 38.26 kg/m??.    Physical Exam  Vitals signs and nursing note reviewed.   Constitutional:       General: He is not in acute distress.     Appearance: Normal appearance. He is well-developed. He is ill-appearing. He is not toxic-appearing.      Comments: He looks ill fatigued and weak   HENT:      Head: Normocephalic and atraumatic.      Nose: No congestion.      Mouth/Throat:      Pharynx: No oropharyngeal exudate or posterior oropharyngeal erythema.   Eyes:      General: No scleral icterus.        Right eye:  No discharge.         Left eye: No discharge.   Cardiovascular:      Rate and Rhythm: Normal rate and regular rhythm.      Heart sounds: Normal heart sounds. No murmur. No gallop.    Pulmonary:      Effort: Pulmonary effort is normal. No respiratory distress.      Breath sounds: Normal breath sounds. No wheezing, rhonchi or rales.   Abdominal:      General: There is no distension.      Tenderness: There is no abdominal tenderness. There is no guarding.   Musculoskeletal:  General: Swelling present.      Right lower leg: Edema present.      Comments: He has swelling and redness of his right foot.  His left foot is bandaged with a previous ulceration.  He has an appointment to see his podiatrist next week in [place].   Skin:     General: Skin is warm.      Coloration: Skin is not jaundiced.      Findings: No erythema or rash.       Health Maintenance Due   Topic Date Due   ??? FOBT Q1Y Age 11-75  12/22/1998   ??? Pneumococcal 65+ years (1 of 1 - PPSV23) 12/21/2013   ??? Shingrix Vaccine Age 66> (2 of 2) 05/04/2018         Assessment and orders:     Encounter Diagnoses     ICD-10-CM ICD-9-CM   1. Skin ulcer of right foot, limited to breakdown of skin ([place]) L97.511 707.15   2. Cellulitis of foot, right L03.115 682.7   3. Skin ulcer of left foot, limited to breakdown of skin ([place]) L97.521 707.15   4. Hx of osteomyelitis Z87.39 V13.59   5. Orthostatic hypotension I95.1 458.0   6. Weakness generalized R53.1 780.79     Diagnoses and all orders for this visit:    1. Skin ulcer of right foot, limited to breakdown of skin ([place]) with associated purulent foul-smelling discharge and cellulitis.  Extent of his infection is hard to ascertain.  -     XR FOOT RT AP/LAT; Future    2. Cellulitis of foot, right-above    3. Skin ulcer of left foot, limited to breakdown of skin ([facility])-this was previously infected but not now is a guy, getting.      4. Hx of osteomyelitis-involving right foot.    5. Orthostatic hypotension-severe  associated with generalized weakness fatigue drowsiness.  Multifactorial.  He already has a deformed scar on his right forehead from a fall precipitated by his orthostatic hypotension.    6. Weakness generalized-multifactorial.  His thyroid test is borderline low.  His beta-blockers dosage may be too high for him since he is lost weight.    I planned on treating him initially as an outpatient but he needs evaluation emergency room,observation and consideration for admission since he is feeling this badly and has acute infection and severe orthostasis.  His left foot infection previously sensitive to Bactrim.  His wounds are usually polymicrobial infections.    These are the 2 antimicrobials I had planned to use.  -     trimethoprim-sulfamethoxazole (BACTRIM DS, SEPTRA DS) 160-800 mg per tablet; Take 1 Tab by mouth two (2) times a day for 10 days.  -     cephALEXin (KEFLEX) 500 mg capsule; Take 1 Cap by mouth four (4) times daily for 10 days.    Plan of care:  Discussed diagnoses in detail with patient.     Medication risks/benefits/side effects discussed with patient.     All of the patient's questions were addressed. The patient understands and agrees with our plan of care.    The patient knows to call back if they are unsure of or forget any changes we discussed today or if the symptoms change.     The patient received an After-Visit Summary which contains VS, orders, medication list and allergy list. This can be used as a "mini-medical record" should they have to seek medical care while out of town.  Patient Care Team:  [name], MD as PCP - General (Family Practice)  Donalda Ewings Neale Burly, MD as PCP - [facility] Empaneled Provider  Cammy Copa, MD (Cardiology)  Verdene Lennert, MD (Orthopedic Surgery)  Beola Cord., MD (Orthopedic Surgery)  [name], Patrick E, DPM (Podiatry)  Valetta Close, DO as Hospitalist (Infectious Diseases)  Rinaldo Cloud, MD (Orthopedic Surgery)          Signed By: [name], MD      June 02, 2018      ATTENTION:   This medical record was transcribed using an electronic medical records/speech recognition system.  Although proofread, it may and can contain electronic, spelling and other errors.  Corrections may be executed at a later time.  Please feel free to contact me for any clarifications as needed.

## 2018-06-02 NOTE — Progress Notes (Signed)
Progress  Notes by Tiyon Sanor X. at 06/02/18 2018                Author: Sheryle Hail  Service: FAMILY MEDICINE  Author Type: Resident       Filed: 06/02/18 2105  Date of Service: 06/02/18 2018  Status: Attested Addendum          Editor: Ulyssa Walthour X.       Related Notes: Original Note by Sheryle Hail filed at 06/02/18 2102          Cosigner: Frankey Shown, MD at 06/03/18 1658          Attestation signed by Frankey Shown, MD at 06/03/18 1658          [facility] Attending Addendum:      I was NOT present with the resident during the interview & examination of the patient. I personally interviewed the patient & repeated the critical or key portions of the exam.  I agree with the resident's note including their history, physical exam and  assessment/plan.      Frankey Shown, MD    Pt seen and examined on 06/03/2018                                                [facility]    Senior Resident Admission Note            HPI:   Elijah [name] is a 68 y.o. male with  h/o severe peripheral neuropathy,OSA (CPAP), HTN HLD, CAD with CABG, Obesity and s/p Gastric Sleeve   who was referred from clinic to the ED with concern for cellulitis of the right foot. He has a wound which started as a cyst about 2-3 weeks ago, then opened up about a week ago followed by  erythema, swelling and warmth of the right foot. He was seen in clinic today and was hypotensive with generalized malaise and sent to the ER. See Dr. Valentina Lucks HPI.           Visit Vitals      BP  147/69        Pulse  62     Temp  98 ??F (36.7 ??C)     Resp  20     Ht  6\' 1"  (1.854 m)     Wt  288 lb 12.8 oz (131 kg)     SpO2  99%        BMI  38.10 kg/m??        General  no distress, well developed, well nourished, but feeling malaised   HEENT  oropharynx clear and moist mucous membranes. Laceration right side forehead with dressing on it from fall about a month ago    Respiratory  Clear Breath Sounds Bilaterally, No Increased Effort and Good Air Movement Bilaterally   Cardiovascular   RRR, S1S2, No murmur and Radial/Pedal Pulses 2+/=   Abdomen  soft, non tender and non distended   Skin: redness, warmth and erythema of the right foot, ulcer 2x1 x 0.5 cm as below, bunion, calluses.    Left foot with callus and chronic punctured ulcers2x2 x0.5cm in the lateral aspect of the sole of the left feet.    Good pulses bilaterally 2-3+, but decreased sensation to touch  and pain bilaterrally   Musculoskeletal no swelling or tenderness and strength normal and equal bilaterally   Neurology  AAO and CN II - XII grossly intact       Chart reviewed.                          A/P:    Cellulitis of the right foot  . Blood cultures,wound culture drawn in ED   - Admit to telemetry   - Vital signs per unit protocol   - Vanc and zosyn    - Sending blood cultures    - podiatry, wound care recs.    - [place] lower extremities to assess for DVT?   - R. foot XR negative for acute osteo, but will attempt an MRI to r/o osteo.( had hip replacement bilaterally)      Hypotension: resolved, but seems to have severe orthostasis likely 2/2 recent bypass surgery. Will check orthostatics. Hold bystolic for now.       Patient seen, examined, and discussed with Dr. Valentina Lucks  (PGY-1). For the remaining assessment and plan of other medical problems please refer to Dr. Jone Baseman H&P for more details.      Pt discussed with on-call attending physician      Mieko Kneebone X. Annleigh Knueppel, MD   Family Medicine Resident

## 2018-06-02 NOTE — ED Notes (Signed)
 TRANSFER - OUT REPORT:    Verbal report given to Teresita, RN(name) on FLORIAN CHAUCA  being transferred to 5th Floor(unit) for routine progression of care       Report consisted of patient's Situation, Background, Assessment and   Recommendations(SBAR).     Information from the following report(s) SBAR, Kardex, ED Summary, MAR, Accordion and Recent Results was reviewed with the receiving nurse.    Lines:   Peripheral IV 06/02/18 Right Antecubital (Active)   Site Assessment Clean, dry, & intact 06/02/2018 10:00 PM   Phlebitis Assessment 0 06/02/2018 10:00 PM   Infiltration Assessment 0 06/02/2018 10:00 PM   Dressing Status Clean, dry, & intact 06/02/2018 10:00 PM   Dressing Type Transparent 06/02/2018 10:00 PM   Hub Color/Line Status Flushed 06/02/2018 10:00 PM   Action Taken Blood drawn 06/02/2018  5:38 PM   Alcohol Cap Used Yes 06/02/2018 10:00 PM       Peripheral IV 06/02/18 Distal;Left Antecubital (Active)   Site Assessment Clean, dry, & intact 06/02/2018 10:00 PM   Phlebitis Assessment 0 06/02/2018 10:00 PM   Infiltration Assessment 0 06/02/2018 10:00 PM   Dressing Status Clean, dry, & intact 06/02/2018 10:00 PM   Dressing Type Transparent 06/02/2018 10:00 PM   Hub Color/Line Status Pink;Capped 06/02/2018 10:00 PM        Opportunity for questions and clarification was provided.      Patient transported with:   Monitor  Registered Nurse

## 2018-06-02 NOTE — Op Note (Signed)
[facility]  OPERATIVE REPORT    Name:  DERICO, MITTON  MR#:  [id number]  DOB:  31-Oct-1950  ACCOUNT #:  [id number]  DATE OF SERVICE:  06/04/2018    PREOPERATIVE DIAGNOSES:  1.  Cellulitis, right foot.  2.  Right foot infection.  3.  Abscess, right foot.  4.  Acute osteomyelitis, left foot.    POSTOPERATIVE DIAGNOSES:  1.  Cellulitis, right foot.  2.  Right foot infection.  3.  Abscess, right foot.  4.  Acute osteomyelitis, left foot.    PROCEDURE PERFORMED:  1.  Excision and drainage, right foot, multiple fascial spaces.  2.  Incision of bone cortex, left fifth digit proximal phalanx.  3.  Incision of bone cortex, left fifth metatarsal.    SURGEON:  Lorraine Lax, DPM    ASSISTANT:  Keitha [place], LPN    ANESTHESIA:  MAC with IV sedation and local anesthesia.    COMPLICATIONS:  None.    SPECIMENS REMOVED:  1.  Left fifth toe proximal phalanx bone for pathology.  2.  Left fifth metatarsal bone for pathology and culture.  3.  Right foot abscess swab for culture.    IMPLANTS:  None.    ESTIMATED BLOOD LOSS:  5 mL    INDICATIONS/VERIFICATION:  This is a 68 year old male who presented to [facility] ED due to feeling lethargic and not well overall.  He was found to have right foot cellulitis that looked associated with plantar midfoot wound.  The patient also has a history of left fifth submetatarsal ulceration for the last few months, which was being treated by Dr. Rolla Etienne at [facility].  I was consulted for further evaluation and management.  MRIs were ordered, which did show some purulent pus, area of abscess to the right midfoot, plus osteomyelitis of the left fifth metatarsal.  In depth discussion was held with the patient regarding these findings.  Conservative and surgical options were discussed which include but are not limited to incision and drainage of the right foot and to the left foot, do nothing, wound care, observation, partial foot ray amputation, bone biopsies,  long-term IV antibiotics.  The patient relayed understanding to all these options and wished to have a biopsy performed to the left foot fifth metatarsal and proximal phalanx base prior to any amputation and further surgical decision making.    The patient understands the possibility of worsening infections.  He relayed understanding once again.  Risks, benefits, alternatives, and possible complications were discussed in detail with the patient.  No guarantees were given or implied.  Consents were signed and placed in the chart.    PROCEDURE:    The patient was identified in the preoperative holding area.  Lower extremities marked bilaterally.  The patient was then brought back to the operating room and was kept on the inpatient hospital room bed, and bilateral lower extremities were then scrubbed, prepped, and draped in the usual aseptic manner.  Attention was first directed to the left foot.  Incision was made over the proximal phalanx which was then deepened down to bone.  A combination of Jamshidi needle and rongeur and hemostat were utilized, and the specimen from the fifth toe proximal phalanx was then sent to pathology.  Attention was then directed to the fifth metatarsal where another incision was made and was deepened down to bone once again using blunt dissection.  A new Jamshidi needle was utilized along with a rongeur and hemostat.  Specimen was obtained from the fifth metatarsal bone and sent for pathology and cultures.  Surgical site was irrigated with copious amount of sterile normal saline solution.  The plantar foot fifth metatarsal ulceration was debrided as well and no purulent drainage noticed at this time.  The incisions were then closed using 4-0 nylon.  Dressings were applied consisting of Adaptic soaked in Betadine, dry sterile dressing, Kerlix, Coban for leg compression, and Ace wrap.      Attention was then directed to the right foot at the midfoot plantar wound over the medial band plantar  fascia.  Incision was made both proximally and distally, and the wound was excised. Blunt dissection was then utilized, and there was noted to be abscess deep to the plantar fascia.  Culture swabs were obtained and sent to Pathology.  Surgical site was explored further.  There were multiple fascial planes both proximally, distally, medially, and laterally, and no additional pus or purulence was encountered.  Surgical site was then irrigated with copious amounts of sterile normal saline solution.  The incision was then packed with Iodoform packing strips, and 2-0 nylon suture was utilized to reapproximate the incision for retention of the pack, ensuring that the packing would not extrude into the wound.  Dressing was then applied consisting of Adaptic soaked in Betadine, gauze, Kerlix, Coban for leg compression, and an Ace wrap.  The patient tolerated the procedure and anesthesia well and was transferred back to the recovery room with vital signs stable and neurovascular status intact to all the toes of the bilateral feet.  The patient will then be discharged back to his hospital room bed once cleared by Anesthesia while we await pathology results.      Lorraine Lax, DPM      [id number]  D:  06/04/2018 11:37  T:  06/04/2018 21:58  JOB #:  [id number]

## 2018-06-02 NOTE — Consults (Signed)
[facility]  CONSULTATION    Name:  SNYDER, BOSSIER  MR#:  [id number]  DOB:  [birth year]  ACCOUNT #:  [id number]  DATE OF SERVICE:  06/06/2018    CHIEF COMPLAINT:  Right foot pain.    HISTORY OF PRESENT ILLNESS:  The patient is a 68 year old male with past medical history significant for obstructive sleep apnea, hypertension, coronary artery disease and obesity who was admitted to [facility] on 03/06 with the aforementioned complaint.  The patient has a history of idiopathic peripheral neuropathy.  He was seen approximately 2 years ago with a foot infection with osteomyelitis, requiring 6-week course of intravenous antibiotics.  He has been followed since that time at a wound healing center in [place].  He has had a chronic wound on the left foot that he was told was stable.  Approximately 2 weeks ago, he developed a "cyst" on the right foot which ruptured and began to drain bloody material.  Workup at this admission has revealed osteomyelitis involving the left foot and an abscess with cellulitis on the right foot.  He has been seen by Podiatry and underwent incision and drainage of the right foot abscess and biopsy of left foot on 03/08.  Cultures and pathology are pending.  He is currently on piperacillin-tazobactam and vancomycin.  The Infectious Diseases Service has been asked to assist with antibiotic management.    PAST MEDICAL HISTORY:  Benign prostatic hypertrophy, coronary artery disease, dyslipidemia, morbid obesity, obstructive sleep apnea, idiopathic peripheral neuropathy, tubular adenoma.    PAST SURGICAL HISTORY:  Hip replacement.    FAMILY HISTORY:  CVA.    SOCIAL HISTORY:  No alcohol, tobacco or illicit drug use.    ALLERGIES:  NO ANTIBIOTIC ALLERGIES.    REVIEW OF SYSTEMS:  As per the HPI.  Remainder 10-system review of systems is unremarkable.    LABORATORY DATA:  White blood cell count 4300, platelet count 207,000.  Creatinine is 0.96, C-reactive  protein is 5.4.  Cultures from the right foot abscess are growing methicillin-sensitive Staphylococcus aureus and group B streptococcus.  Cultures from the bone biopsy reveal no growth to date.  MRI of the left foot reveals probable fifth MTP joint septic arthritis with osteomyelitis of the fifth metatarsal head and likely fifth proximal phalanx.    PHYSICAL EXAMINATION:  VITAL SIGNS:  Temperature is 98.4, maximum temperature is less than 100, heart rate 65 beats per minute, blood pressure 144/79, respiratory rate 17, oxygen saturation 97% on room air.  GENERAL:  Alert, in no acute distress.  HEENT:  Normocephalic, atraumatic.  Mucous membranes moist.  NECK:  Supple.  HEART:  Regular rate and rhythm.  No murmurs, gallops, rubs.  PULMONARY:  Clear to auscultation bilaterally.  No rales, rhonchi or wheezes.  ABDOMEN:  Soft, obese, nontender.  EXTREMITIES:  No clubbing, cyanosis or edema.  Both feet are bandaged postoperatively.  SKIN:  No rashes.  PSYCHIATRIC:  Normal affect.  NEUROLOGIC:  Cranial nerves II-XII grossly intact.  No focal deficits.    ASSESSMENT AND PLAN:  1.  Diabetic foot infection with likely osteomyelitis and septic arthritis involving the left foot.  The patient is status post incision and drainage with bone biopsy.  Cultures reveal no growth to date.  Pathology is pending.  I anticipate the patient will need a PICC line and 6-week course of IV antibiotics at the time of discharge.  This was discussed at length with the patient at bedside.  We will  make further recommendations pending final culture results and pathology results.  2.  Right foot abscess.  The patient is status post incision and drainage on 03/08.  Cultures are growing group B streptococcus and methicillin-sensitive Staphylococcus aureus.  No osteomyelitis was noted on MRI.  Continue antibiotics as above.  3.  Morbid obesity.  The patient is status post gastric sleeve surgery.    Thank you for allowing me to participate in the care  of this patient.      Norena Bratton A Ailish Prospero, DO      [id number]  D:  06/06/2018 12:14  T:  06/06/2018 16:22  JOB #:  [id number]

## 2018-06-02 NOTE — ED Notes (Signed)
Pt was referred to ER for wound to his right foot and low BP.

## 2018-06-02 NOTE — H&P (Signed)
H&P by Lovey Newcomer, DO at 06/02/18 1916                Author: Lovey Newcomer, DO  Service: FAMILY MEDICINE  Author Type: Resident       Filed: 06/03/18 0539  Date of Service: 06/02/18 1916  Status: Attested Addendum          Editor: Lovey Newcomer, DO (Resident)       Related Notes: Original Note by Lovey Newcomer, DO (Resident) filed at 06/03/18 [phone]          Cosigner: Oren Bracket, MD at 06/03/18 1702          Attestation signed by Oren Bracket, MD at 06/03/18 [place] [facility] Attending Addendum:      I was NOT present with the resident during the interview & examination of the patient. I personally interviewed the patient & repeated the critical or key portions of the exam.  I agree with the resident's note with the following additions:      68 y.o. male who  has a past medical history of BPH (benign prostatic hyperplasia), CAD (coronary artery disease), History of vascular access device (09/08/2016), HLD (hyperlipidemia), HTN (hypertension), Morbid obesity due to excess calories ([place]) (04/24/2015),  OSA (obstructive sleep apnea), Polyneuropathy, and Tubular adenoma of colon. admitted for RLE cellulitis and bilateral foot ulcers.       Patient Vitals for the past 4 hrs:     Temp  Pulse  Resp  BP  SpO2      06/03/18 1620  --  67  --  118/72  --      06/03/18 1612  --  65  --  131/70  --      06/03/18 1604  --  66  --  108/65  --      06/03/18 1523  98.1 ??F (36.7 ??C)  (!) 56  18  146/89  99 %      06/03/18 1337  98.2 ??F (36.8 ??C)  (!) 55  16  145/78  98 %              Pt alert and oriented x 3, pleasant, appropriate affect   No resp distress   R foot with ulcer on medial plantar surface, scant drainage      1.  Right foot ulcer/cellulitis: XR neg for osteo x 2, MRI pending. Podiatry  consulted, apprec recs and care. Continue vancomycin and zosyn. WCx pending. Collected BCx given dizziness, fatigue, "ill feeling" despite no SIRS. Also  checking TTE, orthostatics.      See resident note for detailed assessment and plan.      Oren Bracket, MD    Pt seen and examined on 06/03/2018                                                 [place] [address]    Office [phone]   Fax [phone]             Admission H&P            Name: Elijah [name]  MRN: [id number]   Sex: Male         Date of Birth: 1951/01/05  Age: 68 y.o.   PCP: [name], MD        Source of Information: patient, medical records      Chief complaint: Rt foot cellulitis       History of Present Illness   Elijah [name] is a 68 y.o. male with known polyneuropathy, OSAS (CPAP), HTN HLD, CAD with CABG x5 in 2011, Obesity and s/p Gastric Sleeve in 01/2018  who presents to the ER complaining of Rt  foot pain. He noticed a "cyst" 2 weeks ago.  A week after that it appeared to be punctured and started bleeding.  Denies No f/c, diaphoresis, n/v/d, chest pain, SOB, palpitations, dysuria, constipation, pain in lower extremities.  Endorses some swelling  and redness.  Pt does not recall injuring his foot and usually wears his shoes all the time except when he is in his bedroom. He has not taken an antibiotics for this.         Also endorses significant fatigue x1 month.  States it has been present since he had Gastric Sleeve but feels that it has worsened in the past month.  He notes dizziness upon standing for the past month and had x1 fall 2/2 to this in which he hit his  Rt forehead and required ~20 stiches.           In the Er:    vital signs were unremarkable    Labs were remarkable for elevated CRP to 5.4 and ESR elevated to 58.    XR Rt foot    IMPRESSION: Deformity of the right first and second MTP joints is unchanged. No   acute fracture identified..         Patient Vitals for the past 12 hrs:            Temp  Pulse  Resp  BP  SpO2            06/02/18 1900  --  --  --  117/69  96 %            06/02/18 1845  --  --  --  116/64  (!) 76 %             06/02/18 1704  98 ??F (36.7 ??C)  62  20  118/66  100 %                 Past Medical History:        Diagnosis  Date         ?  BPH (benign prostatic hyperplasia)       ?  CAD (coronary artery disease)            CABG x 5 2011- nuke [place] basal [place]         ?  History of vascular access device  09/08/2016          [facility] VAT, 5 FR double, R brachial, 48 cm with 1 cm out, LTA         ?  HLD (hyperlipidemia)       ?  HTN (hypertension)       ?  Morbid obesity due to excess calories ([place])  04/24/2015     ?  OSA (obstructive sleep apnea)       ?  Polyneuropathy       ?  Tubular adenoma of colon            x2  on colonoscopy 09/18/15            Home Medications      Prior to Admission medications             Medication  Sig  Start Date  End Date  Taking?  Authorizing Provider            trimethoprim-sulfamethoxazole (BACTRIM DS, SEPTRA DS) 160-800 mg per tablet  Take 1 Tab by mouth two (2) times a day for 10 days.  06/02/18  06/12/18    [name], MD     cephALEXin (KEFLEX) 500 mg capsule  Take 1 Cap by mouth four (4) times daily for 10 days.  06/02/18  06/12/18    [name], MD     BYSTOLIC 10 mg tablet  TAKE 1 TABLET BY MOUTH EVERY DAY  05/19/18      [name], MD     DULoxetine (CYMBALTA) 60 mg capsule  TAKE 1 CAPSULE BY MOUTH DAILY  05/19/18      [name], MD     pregabalin (LYRICA) 300 mg capsule  TAKE 1 CAPSULE BY MOUTH TWICE A DAY  05/19/18      [name], MD     pantoprazole (PROTONIX) 40 mg tablet  TAKE 1 TABLET BY MOUTH DAILY AFTER [place]  02/06/18      Provider, Historical     diclofenac (VOLTAREN) 1 % gel  Apply 2 g to affected area four (4) times daily. Apply to right knee.  04/05/18      [name], MD     lisinopril (PRINIVIL, ZESTRIL) 20 mg tablet  TAKE 1 TABLET BY MOUTH DAILY   Patient taking differently: 10 mg. TAKE 1 TABLET BY MOUTH DAILY  12/08/17  06/02/18    Lillie Fragmin, MD     cyanocobalamin (VITAMIN B12) 500 mcg  tablet  Take 1 Tab by mouth daily.  09/15/17      Vernia Buff, MD     cholecalciferol (VITAMIN D3) 5,000 unit tab tablet  Take 1 Tab by mouth daily.  09/15/17      Vernia Buff, MD     atorvastatin (LIPITOR) 40 mg tablet  Take 80 mg by mouth nightly.        Provider, Historical     co-enzyme Q-10 (CO Q-10) 100 mg capsule  Take 100 mg by mouth nightly.        Provider, Historical     Flaxseed Oil oil  Take 1 Cap by mouth daily.        Provider, Historical     cpap machine kit  by Does Not Apply route.        Provider, Historical            DOCOSAHEXANOIC ACID/EPA (FISH OIL PO)  Take 1 Cap by mouth daily.        Provider, Historical           Allergies     Allergies        Allergen  Reactions         ?  Metoprolol  Other (comments)             Patient states, made me feel horrible.             Past Medical History:        Diagnosis  Date         ?  BPH (benign prostatic  hyperplasia)       ?  CAD (coronary artery disease)            CABG x 5 2011- nuke with fixed basal inferior [place]         ?  History of vascular access device  09/08/2016          [facility] VAT, 5 FR double, R brachial, 48 cm with 1 cm out, LTA         ?  HLD (hyperlipidemia)       ?  HTN (hypertension)       ?  Morbid obesity due to excess calories ([place])  04/24/2015     ?  OSA (obstructive sleep apnea)       ?  Polyneuropathy       ?  Tubular adenoma of colon            x2 on colonoscopy 09/18/15             Past Surgical History:         Procedure  Laterality  Date          ?  HX HIP REPLACEMENT  Bilateral               Family History         Problem  Relation  Age of Onset          ?  Stroke  Mother       ?  Other  Father                mrsa infxn caused death          ?  Heart Attack  Brother             Social History     Social History          Socioeconomic History         ?  Marital status:  MARRIED              Spouse name:  Not on file         ?  Number of children:  Not on file     ?  Years of education:  Not on  file     ?  Highest education level:  Not on file       Occupational History        ?  Not on file       Social Needs         ?  Financial resource strain:  Not on file        ?  Food insecurity:              Worry:  Not on file         Inability:  Not on file        ?  Transportation needs:              Medical:  Not on file         Non-medical:  Not on file       Tobacco Use         ?  Smoking status:  Former Smoker              Types:  Cigarettes         ?  Smokeless tobacco:  Never Used       Substance and Sexual Activity         ?  Alcohol use:  No             Comment: recently has stopped         ?  Drug use:  No     ?  Sexual activity:  Not on file       Lifestyle        ?  Physical activity:              Days per week:  Not on file         Minutes per session:  Not on file         ?  Stress:  Not on file       Relationships        ?  Social connections:              Talks on phone:  Not on file         Gets together:  Not on file         Attends religious service:  Not on file         Active member of club or organization:  Not on file         Attends meetings of clubs or organizations:  Not on file         Relationship status:  Not on file        ?  Intimate partner violence:              Fear of current or ex partner:  Not on file         Emotionally abused:  Not on file         Physically abused:  Not on file         Forced sexual activity:  Not on file        Other Topics  Concern        ?  Not on file       Social History Narrative        ?  Not on file           Alcohol history: Not at all   Smoking history: quit 30 years ago    Illicit drug history: Not at all      Living arrangement: patient lives with their spouse.   Ambulates: With cane      Review of Systems:    Review of Systems    Constitutional: Positive for fatigue. Negative for activity change, appetite change, chills, diaphoresis, fever and unexpected weight change.     HENT: Negative for congestion, sore throat and trouble swallowing.      Eyes: Negative for visual disturbance.    Respiratory: Negative for cough, shortness of breath and wheezing.     Cardiovascular: Negative for chest pain.    Gastrointestinal: Negative for abdominal pain, constipation, diarrhea, nausea and vomiting.    Genitourinary: Negative for dysuria and hematuria.    Musculoskeletal: Negative for arthralgias and myalgias.    Skin: Positive for wound. Negative for rash.    Neurological: Negative for dizziness and headaches.    Psychiatric/Behavioral: Negative for confusion.           Physical Exam        O2 Device: Room air       General:  No acute distress. Alert. Cooperative.        Head:  Normocephalic.  Healing scalp wound to Rt forehead with small amount of drainage.  Eyes:               Conjunctiva pink. Sclera white. PERRL.     Ears:               Ear canals patent. TM non-erythematous.     Nose:              Septum midline. Mucosa pink. No drainage.     Throat:  Mucosa pink. Moist mucous membranes. No tonsillar exudates or erythema. Palate movement equal bilaterally.     Neck:  Supple. Normal ROM. No stiffness.     Respiratory:  CTAB. No w/r/r/c.     Cardiovascular:  RRR. Normal S1,S2. No m/r/g. Pulses 2+ throughout.     GI:  + bowel sounds. Nontender. No rebound tenderness or guarding. Nondistended.     Extremities:  Non pitting BL LE edema and erythema and slight warmth to upper ankle of Rt foot. Distal pulses intact bilaterally      Musculoskeletal:  Full ROM in all extremities.      Skin:  Warm and dry, no rashes. Small 1-2cm wound to plantar aspect Rt medial foot with presence of  drainage on dressing.  (see photos of new Rt foot wound and chronic Lt foot wound below)         Neuro:  CN II-XII grossly intact. Strength 5/5 in all extremities.         RT FOOT                       Left FOOT                              Laboratory Data     Recent Results (from the past 8 hour(s))     CBC WITH AUTOMATED DIFF          Collection Time: 06/02/18  5:39 PM          Result  Value  Ref Range            WBC  5.9  4.1 - 11.1 K/uL       RBC  3.51 (L)  4.10 - 5.70 M/uL       HGB  11.1 (L)  12.1 - 17.0 g/dL       HCT  33.6 (L)  36.6 - 50.3 %       MCV  95.7  80.0 - 99.0 FL       MCH  31.6  26.0 - 34.0 PG       MCHC  33.0  30.0 - 36.5 g/dL       RDW  16.3 (H)  11.5 - 14.5 %       PLATELET  186  150 - 400 K/uL       MPV  11.9  8.9 - 12.9 FL       NRBC  0.0  0 PER 100 WBC       ABSOLUTE NRBC  0.00  0.00 - 0.01 K/uL       NEUTROPHILS  63  32 - 75 %       LYMPHOCYTES  23  12 - 49 %       MONOCYTES  10  5 - 13 %       EOSINOPHILS  3  0 - 7 %       BASOPHILS  1  0 - 1 %  IMMATURE GRANULOCYTES  0  0.0 - 0.5 %       ABS. NEUTROPHILS  3.7  1.8 - 8.0 K/UL       ABS. LYMPHOCYTES  1.4  0.8 - 3.5 K/UL       ABS. MONOCYTES  0.6  0.0 - 1.0 K/UL       ABS. EOSINOPHILS  0.2  0.0 - 0.4 K/UL       ABS. BASOPHILS  0.0  0.0 - 0.1 K/UL       ABS. IMM. GRANS.  0.0  0.00 - 0.04 K/UL       DF  AUTOMATED          METABOLIC PANEL, COMPREHENSIVE          Collection Time: 06/02/18  5:39 PM         Result  Value  Ref Range            Sodium  139  136 - 145 mmol/L       Potassium  3.8  3.5 - 5.1 mmol/L       Chloride  109 (H)  97 - 108 mmol/L       CO2  26  21 - 32 mmol/L       Anion gap  4 (L)  5 - 15 mmol/L       Glucose  113 (H)  65 - 100 mg/dL       BUN  15  6 - 20 MG/DL       Creatinine  1.10  0.70 - 1.30 MG/DL       BUN/Creatinine ratio  14  12 - 20         GFR est AA  >60  >60 ml/min/1.90m       GFR est non-AA  >60  >60 ml/min/1.781m      Calcium  8.7  8.5 - 10.1 MG/DL       Bilirubin, total  0.8  0.2 - 1.0 MG/DL       ALT (SGPT)  23  12 - 78 U/L       AST (SGOT)  23  15 - 37 U/L       Alk. phosphatase  66  45 - 117 U/L       Protein, total  7.5  6.4 - 8.2 g/dL       Albumin  3.1 (L)  3.5 - 5.0 g/dL       Globulin  4.4 (H)  2.0 - 4.0 g/dL       A-G Ratio  0.7 (L)  1.1 - 2.2         LACTIC ACID          Collection Time: 06/02/18  5:39 PM         Result  Value  Ref Range            Lactic acid  1.0  0.4  - 2.0 MMOL/L       LIPASE          Collection Time: 06/02/18  5:39 PM         Result  Value  Ref Range            Lipase  137  73 - 393 U/L       TROPONIN I          Collection Time: 06/02/18  5:39 PM         Result  Value  Ref  Range            Troponin-I, Qt.  <0.05  <0.05 ng/mL       SAMPLES BEING HELD          Collection Time: 06/02/18  5:39 PM         Result  Value  Ref Range            SAMPLES BEING HELD  [email or web address]         COMMENT                  Add-on orders for these samples will be processed based on acceptable specimen integrity and analyte stability, which may vary by analyte.       URINALYSIS W/MICROSCOPIC          Collection Time: 06/02/18  6:19 PM         Result  Value  Ref Range            Color  YELLOW/STRAW          Appearance  CLEAR  CLEAR         Specific gravity  <1.005  1.003 - 1.030       pH (UA)  6.0  5.0 - 8.0         Protein  NEGATIVE   NEG mg/dL       Glucose  NEGATIVE   NEG mg/dL       Ketone  NEGATIVE   NEG mg/dL       Bilirubin  NEGATIVE   NEG         Blood  NEGATIVE   NEG         Urobilinogen  1.0  0.2 - 1.0 EU/dL       Nitrites  NEGATIVE   NEG         Leukocyte Esterase  NEGATIVE   NEG         WBC  0-4  0 - 4 /hpf       RBC  0-5  0 - 5 /hpf       Epithelial cells  FEW  FEW /lpf            Bacteria  NEGATIVE   NEG /hpf           Imaging     CXR Results  (Last 48 hours)          None                 CT Results  (Last 48 hours)          None                          Assessment and Plan        CLEVER GERALDO is a 68 y.o. male who is admitted for Rt foot wound with known polyneuropathy, OSAS (CPAP), HTN HLD, CAD with CABG x5 in 2011, Obesity and s/p Gastric Sleeve  in 01/2018 .      ACUTE PROBLEMS    Rt foot wound- h/o osteo involving Rt foot, continues to follow with podiatry at [facility]. x2 weeks of painful sore on Rt foot.  No abx  given outpatient.  POA elevated CRP to 5.4 and ESR elevated to 58. LA wnl, XR- no fx and no osteo.    -Admit to tele    -Vitals per unit routine     -I&O, NPO at midnight,  start MIVF at 135m/hr    - Start Vanc and Zosyn for broad spectrum coverage    -f/u blood and wound cx    -daily CBC/CMP/mag/phos    -f/u MRI foot    -d/u BLE doppler    -given 1 dose Lovenox 469mat 2000.  Will hold at midnight until seen by Podiatry.  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  BP in PCP office  today 107/66.. PMarland KitchenA BP  118/66.  Iron profile, TSH and B12 from 3/2 wnl.    -will hold home Bystolic 63m78IOily    -f/u orthostatics    -f/u echo    -daily CBC/CMP/mag/phos    -Will continue to monitor at this time and readjust as BP's trend.      CHRONIC PROBLEMS       S/p Gastric Sleeve: 70 lb wt loss since 11/19    -continue home B complex    -continue home B12       Hyperlipidemia: Last lipid panel on 1/20 and values were TC- 118, TG- 119,  HDL-41, LDL-53,    -Continue home Lipitor 77m67mily       Polyneuropathy: chronic bilateral below the knee    -continue home Lyrica 300mg62m    -continue home Cymbalta 60mg 74m daily       Sleep Apnea:    -continue CPAP qhs       Obesity BMI Body mass index is 38.1 kg/m??.   - Encouraging lifestyle modifications and further follow up outpatient.          FEN/GI - NPO after midnight. NS at 150 mL/hr.   Activity - Ambulate as tolerated   DVT prophylaxis - SCDs until after podiatry see's Pt.  If no surgery then start Lovenox    GI prophylaxis - Not indicated at this time   Fall prophylaxis - Fall precautions ordered.   Disposition - Admit to Remote Telemetry. Plan to d/c to Home. Consulting PT/OT/CM   Code Status - Full, discussed with patient / caregivers.   Next of Elijah Name and Contact Wife MarylyJacqulyn [phone] HowardNIKOLAUS PIENTAbe discussed Dr. PowersPrince Rome  7:16 PM, 06/02/18   [facility]  Resident              For Billing          Chief Complaint      Patient presents with      ?  Fatigue      ?  Foot Problem               Hospital Problems   Date Reviewed:  06/02/2018        None

## 2018-06-02 NOTE — ED Notes (Signed)
Patient's HR not in 20, data error. Patient's HR 55

## 2018-06-02 NOTE — Progress Notes (Signed)
[facility]: MED RECONCILIATION      Information obtained from: Patient     Allergies: Metoprolol    Prior to Admission Medications:     Medication Documentation Review Audit       Reviewed by Patel, Hemang M, PHARMD (Pharmacist) on 06/02/18 at 2120      Medication Sig Documenting Provider Last Dose Status Taking?   atorvastatin (LIPITOR) 80 mg tablet Take 80 mg by mouth daily. Provider, Historical 06/02/2018 Active Yes   b complex vitamins tablet Take 1 Tab by mouth daily. Provider, Historical 06/02/2018 Active Yes   BYSTOLIC 10 mg tablet TAKE 1 TABLET BY MOUTH EVERY DAY Spence, Steven N, MD 06/02/2018 Active Yes   cephALEXin (KEFLEX) 500 mg capsule Take 1 Cap by mouth four (4) times daily for 10 days. Spence, Steven N, MD NEW RX - Not started yet Active No   co-enzyme Q-10 (CO Q-10) 100 mg capsule Take 100 mg by mouth daily. Provider, Historical 06/02/2018 Active Yes   cpap machine kit by Does Not Apply route. Provider, Historical  Active Yes   cyanocobalamin (VITAMIN B12) 500 mcg tablet Take 1 Tab by mouth daily. Eggleston, William B, MD 06/02/2018 Active Yes   diclofenac (VOLTAREN) 1 % gel Apply 2 g to affected area four (4) times daily as needed for Pain (Knee pain). Provider, Historical  Active Yes   DULoxetine (CYMBALTA) 60 mg capsule TAKE 1 CAPSULE BY MOUTH DAILY Spence, Steven N, MD 06/02/2018 Active Yes   OTHER,NON-FORMULARY, [place] children's chewable vitamins - 2 tablets daily Provider, Historical 06/02/2018 Active Yes   pregabalin (LYRICA) 300 mg capsule TAKE 1 CAPSULE BY MOUTH TWICE A DAY Spence, Steven N, MD 06/02/2018 AM Active Yes   trimethoprim-sulfamethoxazole (BACTRIM DS, SEPTRA DS) 160-800 mg per tablet Take 1 Tab by mouth two (2) times a day for 10 days. Spence, Steven N, MD NEW RX - Has not started yet Active No                        Hemang M Patel, PHARMD   Contact: [phone]

## 2018-06-02 NOTE — ED Provider Notes (Signed)
I have evaluated the patient as the Provider in Triage. I have reviewed His vital signs and the triage nurse assessment. I have talked with the patient and any available family and advised that I am the provider in triage and have ordered the appropriate study to initiate their work up based on the clinical presentation during my assessment.  I have advised that the patient will be accommodated in the Main ED as soon as possible.  I have also requested to contact the triage nurse or myself immediately if the patient experiences any changes in their condition during this brief waiting period.    Pt reports he has an infection in his right foot that is bleeding. He also says his BP is low. He had bariatric surgery in 01/2018 and notes he has felt fatigued, and more confused since. He denies dizziness, shortness of breath, fever, chills, nausea, vomiting.     Note written by Purnell Shoemaker, Scribe, as dictated by Juanda Chance, NP 5:00 PM  ------------------------------------------------------------------------------          The patient was referred in by his PCP because of an infection of his right foot.  When he was seen today he also developed an episode of hypotension.  The patient also complains of generalized fatigue for the past 2 weeks.  He denies fever, vomiting, shortness of breath or other complaints.    Past Medical History:   Diagnosis Date   ??? BPH (benign prostatic hyperplasia)    ??? CAD (coronary artery disease)     CABG x 5 2011[place]ke [place]   ??? History of vascular access device 09/08/2016    [facility] VAT, 5 FR double, R brachial, 48 cm with 1 cm out, LTA   ??? HLD (hyperlipidemia)    ??? HTN (hypertension)    ??? Morbid obesity due to excess calories (HCC) 04/24/2015   ??? OSA (obstructive sleep apnea)    ??? Polyneuropathy    ??? Tubular adenoma of colon     x2 on colonoscopy 09/18/15       Past Surgical History:   Procedure Laterality Date   ??? HX HIP REPLACEMENT  Bilateral          Family History:   Problem Relation Age of Onset   ??? Stroke Mother    ??? Other Father         mrsa infxn caused death   ??? Heart Attack Brother        Social History     Socioeconomic History   ??? Marital status: MARRIED     Spouse name: Not on file   ??? Number of children: Not on file   ??? Years of education: Not on file   ??? Highest education level: Not on file   Occupational History   ??? Not on file   Social Needs   ??? Financial resource strain: Not on file   ??? Food insecurity:     Worry: Not on file     Inability: Not on file   ??? Transportation needs:     Medical: Not on file     Non-medical: Not on file   Tobacco Use   ??? Smoking status: Former Smoker     Types: Cigarettes   ??? Smokeless tobacco: Never Used   Substance and Sexual Activity   ??? Alcohol use: No     Comment: recently has stopped   ??? Drug use: No   ???  Sexual activity: Not on file   Lifestyle   ??? Physical activity:     Days per week: Not on file     Minutes per session: Not on file   ??? Stress: Not on file   Relationships   ??? Social connections:     Talks on phone: Not on file     Gets together: Not on file     Attends religious service: Not on file     Active member of club or organization: Not on file     Attends meetings of clubs or organizations: Not on file     Relationship status: Not on file   ??? Intimate partner violence:     Fear of current or ex partner: Not on file     Emotionally abused: Not on file     Physically abused: Not on file     Forced sexual activity: Not on file   Other Topics Concern   ??? Not on file   Social History Narrative   ??? Not on file         ALLERGIES: Metoprolol    Review of Systems   Constitutional: Positive for fatigue. Negative for fever.   Eyes: Negative for visual disturbance.   Respiratory: Negative for cough, shortness of breath and wheezing.    Cardiovascular: Negative for chest pain and leg swelling.   Gastrointestinal: Negative for abdominal pain, diarrhea, nausea and vomiting.   Genitourinary: Negative  for dysuria.   Musculoskeletal: Negative.  Negative for back pain and neck stiffness.   Skin: Positive for color change and wound. Negative for rash.   Neurological: Negative.  Negative for syncope and headaches.   Psychiatric/Behavioral: Negative for confusion.   All other systems reviewed and are negative.      There were no vitals filed for this visit.         Physical Exam  Constitutional:       General: He is not in acute distress.     Appearance: He is well-developed.   HENT:      Head: Normocephalic.   Eyes:      Pupils: Pupils are equal, round, and reactive to light.   Neck:      Musculoskeletal: Normal range of motion.   Cardiovascular:      Rate and Rhythm: Normal rate.      Heart sounds: No murmur.   Pulmonary:      Effort: Pulmonary effort is normal.      Breath sounds: Normal breath sounds.   Abdominal:      Palpations: Abdomen is soft.      Tenderness: There is no abdominal tenderness.   Musculoskeletal: Normal range of motion.   Skin:     General: Skin is warm and dry.      Capillary Refill: Capillary refill takes less than 2 seconds.      Comments: The right foot has an ulcerated area on the plantar surface with a foul-smelling discharge.  The foot is slightly edematous and red/warm.  Pulses are intact.   Neurological:      Mental Status: He is alert.   Psychiatric:         Behavior: Behavior normal.          MDM       Procedures      Hospitalist Perfect Serve for Admission  6:59 PM    ED Room Number: ER20/20  Patient Name and age:  Elijah [name] 68 y.o.  male  Working Diagnosis:   1. Cellulitis, unspecified cellulitis site    2. Hypotension, unspecified hypotension type      Readmission: no  Isolation Requirements:  no  Recommended Level of Care:  med/surg  Code Status:  Full Code  Department:[facility] - [phone]  Other:

## 2018-06-02 NOTE — Progress Notes (Signed)
1. Have you been to the ER, urgent care clinic since your last visit?  Hospitalized since your last visit?no      2. Have you seen or consulted any other health care providers outside of the [facility] since your last visit?  Include any pap smears or colon screening. No    Reviewed record in preparation for visit and have necessary documentation  Goals that were addressed and/or need to be completed during or after this appointment include     Health Maintenance Due   Topic Date Due   . FOBT Q1Y Age 12-75  12/22/1998   . Pneumococcal 65+ years (1 of 1 - PPSV23) 12/21/2013   . Shingrix Vaccine Age 14> (2 of 2) 05/04/2018       Patient is accompanied by self I have received verbal consent from Elijah [name] to discuss any/all medical information while they are present in the room.

## 2018-06-03 ENCOUNTER — Inpatient Hospital Stay: Admit: 2018-06-04 | Payer: MEDICARE | Primary: Family Medicine

## 2018-06-03 ENCOUNTER — Inpatient Hospital Stay: Payer: MEDICARE | Primary: Family Medicine

## 2018-06-03 ENCOUNTER — Inpatient Hospital Stay: Admit: 2018-06-03 | Payer: MEDICARE | Primary: Family Medicine

## 2018-06-03 LAB — BASIC METABOLIC PANEL
Anion Gap: 5 mmol/L (ref 5–15)
BUN: 13 MG/DL (ref 6–20)
Bun/Cre Ratio: 15 (ref 12–20)
CO2: 24 mmol/L (ref 21–32)
Calcium: 8 MG/DL — ABNORMAL LOW (ref 8.5–10.1)
Chloride: 115 mmol/L — ABNORMAL HIGH (ref 97–108)
Creatinine: 0.88 MG/DL (ref 0.70–1.30)
EGFR IF NonAfrican American: >60 mL/min/{1.73_m2} (ref >60)
GFR African American: >60 mL/min/{1.73_m2} (ref >60)
Glucose: 73 mg/dL (ref 65–100)
Potassium: 3.6 mmol/L (ref 3.5–5.1)
Sodium: 144 mmol/L (ref 136–145)

## 2018-06-03 LAB — TRANSTHORACIC ECHOCARDIOGRAM (TTE) COMPLETE (CONTRAST/BUBBLE/3D PRN)
AV Area by Peak Velocity: 3 cm2
AV Peak Gradient: 9.3 mmHg
AV Peak Velocity: 152.78 cm/s
AV Velocity Ratio: 0.71
AVA/BSA Peak Velocity: 1.2 cm2/m2
Aortic Root: 4.03 cm
Ascending Aorta: 4.04 cm
EF BP: 59.9 % (ref 55–100)
Fractional Shortening 2D: 34.9217 %
IVC Sniffing: 1.5 cm
IVSd: 0.96 cm (ref 0.6–1)
LA Area 4C: 18.5 cm2
LA Major Axis: 4.41 cm
LA Volume 2C: 67.96 mL — AB (ref 18–58)
LA Volume 4C: 48.23 mL (ref 18–58)
LA Volume BP: 58.9 mL (ref 18–58)
LA Volume Index 2C: 27.02 ml/m2 (ref 16–28)
LA Volume Index 4C: 19.17 ml/m2 (ref 16–28)
LA Volume Index BP: 23.42 ml/m2 (ref 16–28)
LA/AO Root Ratio: 1.09
LV E' Lateral Velocity: 10.95 centimeter/second
LV E' Septal Velocity: 6.45 centimeter/second
LV EDV A2C: 127 mL
LV EDV A4C: 148.9 mL
LV EDV BP: 139.7 ml (ref 67–155)
LV EDV Index A2C: 50.5 mL/m2
LV EDV Index A4C: 59.2 mL/m2
LV EDV Index BP: 55.5 mL/m2
LV EDV Teich: 46.3586 mL
LV ESV A2C: 26.5002 mL
LV ESV A2C: 58.2 mL
LV ESV A4C: 36.7347 mL
LV ESV A4C: 53.6 mL
LV ESV BP: 56.1 mL (ref 22–58)
LV ESV Index A2C: 23.1 mL/m2
LV ESV Index A4C: 21.3 mL/m2
LV ESV Index BP: 22.3 mL/m2
LV ESV Teich: 16.7354 mL
LV ESV Teich: 29.6232 mL
LV Ejection Fraction A2C: 54 %
LV Ejection Fraction A4C: 64 %
LV Mass 2D Index: 78.7 g/m2 (ref 49–115)
LV Mass 2D: 197.9 g (ref 88–224)
LVIDd: 5.04 cm (ref 4.2–5.9)
LVIDs: 3.28 cm
LVOT Diameter: 2.32 cm
LVOT Peak Gradient: 4.7 mmHg
LVOT Peak Velocity: 108.43 cm/s
LVPWd: 0.92 cm (ref 0.6–1)
Left Ventricular Ejection Fraction: 58
Left Ventricular Stroke Volume by 2-D Biplane-MOD: 32.2639 mL
MV A Velocity: 66.69 cm/s
MV Area by PHT: 3.8 cm2
MV E Velocity: 96.77 cm/s
MV E Wave Deceleration Time: 198.8 ms
MV E/A: 1.45
MV PHT: 57.7 ms
Mitral Valve E-F Slope by M-mode: 4.8675
PV Max Velocity: 119.75 cm/s
PV Peak Gradient: 5.7 mmHg
RVIDd: 4.78 cm
TAPSE: 2.18 cm — AB (ref 1.5–2)
TR Max Velocity: 263.9 cm/s
TR Peak Gradient: 27.9 mmHg

## 2018-06-03 LAB — CBC
Hematocrit: 32.5 % — ABNORMAL LOW (ref 36.6–50.3)
Hemoglobin: 10.7 g/dL — ABNORMAL LOW (ref 12.1–17.0)
MCH: 31.2 PG (ref 26.0–34.0)
MCHC: 32.9 g/dL (ref 30.0–36.5)
MCV: 94.8 FL (ref 80.0–99.0)
MPV: 11.7 FL (ref 8.9–12.9)
NRBC Absolute: 0 10*3/uL (ref 0.00–0.01)
Nucleated RBCs: 0 PER 100 WBC
Platelets: 150 10*3/uL (ref 150–400)
RBC: 3.43 M/uL — ABNORMAL LOW (ref 4.10–5.70)
RDW: 16.2 % — ABNORMAL HIGH (ref 11.5–14.5)
WBC: 4.3 10*3/uL (ref 4.1–11.1)

## 2018-06-03 LAB — SEDIMENTATION RATE: Sed Rate: 58 mm/hr — ABNORMAL HIGH (ref 0–20)

## 2018-06-03 LAB — PHOSPHORUS
Phosphorus: 3.9 MG/DL (ref 2.6–4.7)
Phosphorus: 3.9 MG/DL (ref 2.6–4.7)

## 2018-06-03 LAB — C-REACTIVE PROTEIN: CRP: 5.41 mg/dL — ABNORMAL HIGH (ref 0.00–0.60)

## 2018-06-03 LAB — MAGNESIUM
Magnesium: 2.3 mg/dL (ref 1.6–2.4)
Magnesium: 2.3 mg/dL (ref 1.6–2.4)

## 2018-06-03 LAB — ECHO ADULT COMPLETE
AV Area by Peak Velocity: 3 cm2
AV Peak Gradient: 9.3 mmHg
AV Peak Velocity: 152.78 cm/s
AV Velocity Ratio: 0.71
AVA/BSA Peak Velocity: 1.2 cm2/m2
Aortic Root: 4.03 cm
Ascending Aorta: 4.04 cm
EF BP: 59.9 % (ref 55–100)
IVC Sniffing: 1.5 cm
IVSd: 0.96 cm (ref 0.6–1.0)
LA Area 4C: 18.5 cm2
LA Major Axis: 4.41 cm
LA Volume 2C: 67.96 mL — AB (ref 18–58)
LA Volume 4C: 48.23 mL (ref 18–58)
LA Volume BP: 58.9 mL (ref 18–58)
LA Volume Index 2C: 27.02 ml/m2 (ref 16–28)
LA Volume Index 4C: 19.17 ml/m2 (ref 16–28)
LA Volume Index BP: 23.42 ml/m2 (ref 16–28)
LA/AO Root Ratio: 1.09
LV E' Lateral Velocity: 10.95 centimeter/second
LV E' Septal Velocity: 6.45 centimeter/second
LV EDV A2C: 127 mL
LV EDV A4C: 148.9 mL
LV EDV BP: 139.7 ml (ref 67–155)
LV EDV Index A2C: 50.5 mL/m2
LV EDV Index A4C: 59.2 mL/m2
LV EDV Index BP: 55.5 mL/m2
LV EDV Teich: 46.3586 mL
LV ESV A2C: 58.2 mL
LV ESV A4C: 53.6 mL
LV ESV BP: 56.1 mL (ref 22–58)
LV ESV Index A2C: 23.1 mL/m2
LV ESV Index A4C: 21.3 mL/m2
LV ESV Index BP: 22.3 mL/m2
LV ESV Teich: 16.7354 mL
LV Ejection Fraction A2C: 54 %
LV Ejection Fraction A4C: 64 %
LV Mass 2D Index: 78.7 g/m2 (ref 49–115)
LV Mass 2D: 197.9 g (ref 88–224)
LVIDd: 5.04 cm (ref 4.2–5.9)
LVIDs: 3.28 cm
LVOT Diameter: 2.32 cm
LVOT Peak Gradient: 4.7 mmHg
LVOT Peak Velocity: 108.43 cm/s
LVPWd: 0.92 cm (ref 0.6–1.0)
Left Ventricular Fractional Shortening by 2D: 34.9217 %
Left Ventricular Stroke Volume by 2-D Biplane-MOD: 32.2639 mL
Left Ventricular Stroke Volume by 2-D Single Plane- MOD: 26.5002 mL
Left Ventricular Stroke Volume by 2-D Single Plane- MOD: 36.7347 mL
Left Ventricular Stroke Volume by Teichholz Method: 29.6232 mL
MV A Velocity: 66.69 cm/s
MV Area by PHT: 3.8 cm2
MV E Velocity: 96.77 cm/s
MV E Wave Deceleration Time: 198.8 ms
MV E/A: 1.45
MV PHT: 57.7 ms
Mitral Valve Deceleration Slope: 4.8675
PV Max Velocity: 119.75 cm/s
PV Peak Gradient: 5.7 mmHg
RVIDd: 4.78 cm
TAPSE: 2.18 cm — AB (ref 1.5–2.0)
TR Max Velocity: 263.9 cm/s
TR Peak Gradient: 27.9 mmHg

## 2018-06-03 LAB — METABOLIC PANEL, BASIC
Anion gap: 5 mmol/L (ref 5–15)
BUN/Creatinine ratio: 15 (ref 12–20)
BUN: 13 MG/DL (ref 6–20)
CO2: 24 mmol/L (ref 21–32)
Calcium: 8 MG/DL — ABNORMAL LOW (ref 8.5–10.1)
Chloride: 115 mmol/L — ABNORMAL HIGH (ref 97–108)
Creatinine: 0.88 MG/DL (ref 0.70–1.30)
GFR est AA: >60 mL/min/{1.73_m2} (ref >60)
GFR est non-AA: >60 mL/min/{1.73_m2} (ref >60)
Glucose: 73 mg/dL (ref 65–100)
Potassium: 3.6 mmol/L (ref 3.5–5.1)
Sodium: 144 mmol/L (ref 136–145)

## 2018-06-03 LAB — C REACTIVE PROTEIN, QT: C-Reactive protein: 5.41 mg/dL — ABNORMAL HIGH (ref 0.00–0.60)

## 2018-06-03 LAB — CBC W/O DIFF
ABSOLUTE NRBC: 0 10*3/uL (ref 0.00–0.01)
HCT: 32.5 % — ABNORMAL LOW (ref 36.6–50.3)
HGB: 10.7 g/dL — ABNORMAL LOW (ref 12.1–17.0)
MCH: 31.2 PG (ref 26.0–34.0)
MCHC: 32.9 g/dL (ref 30.0–36.5)
MCV: 94.8 FL (ref 80.0–99.0)
MPV: 11.7 FL (ref 8.9–12.9)
NRBC: 0 PER 100 WBC
PLATELET: 150 10*3/uL (ref 150–400)
RBC: 3.43 M/uL — ABNORMAL LOW (ref 4.10–5.70)
RDW: 16.2 % — ABNORMAL HIGH (ref 11.5–14.5)
WBC: 4.3 10*3/uL (ref 4.1–11.1)

## 2018-06-03 LAB — SED RATE (ESR): Sed rate, automated: 58 mm/hr — ABNORMAL HIGH (ref 0–20)

## 2018-06-03 MED ORDER — SODIUM CHLORIDE 0.9 % IJ SYRG
Freq: Three times a day (TID) | INTRAMUSCULAR | Status: DC
Start: 2018-06-03 — End: 2018-06-08
  Administered 2018-06-03 – 2018-06-08 (×17): via INTRAVENOUS

## 2018-06-03 MED ORDER — VANCOMYCIN 10 GRAM IV SOLR
10 gram | Freq: Two times a day (BID) | INTRAVENOUS | Status: DC
Start: 2018-06-03 — End: 2018-06-04
  Administered 2018-06-03 – 2018-06-04 (×3): via INTRAVENOUS

## 2018-06-03 MED ORDER — B-COMPLEX WITH VITAMIN C TAB
Freq: Every day | ORAL | Status: DC
Start: 2018-06-03 — End: 2018-06-08
  Administered 2018-06-03 – 2018-06-08 (×6): via ORAL

## 2018-06-03 MED ORDER — HEPARIN (PORCINE) 5,000 UNIT/ML IJ SOLN
5000 unit/mL | Freq: Three times a day (TID) | INTRAMUSCULAR | Status: DC
Start: 2018-06-03 — End: 2018-06-08
  Administered 2018-06-03 – 2018-06-08 (×14): via SUBCUTANEOUS

## 2018-06-03 MED ORDER — SODIUM CHLORIDE 0.9 % IJ SYRG
INTRAMUSCULAR | Status: DC | PRN
Start: 2018-06-03 — End: 2018-06-08

## 2018-06-03 MED ORDER — ENOXAPARIN 40 MG/0.4 ML SUB-Q SYRINGE
40 mg/0.4 mL | SUBCUTANEOUS | Status: AC
Start: 2018-06-03 — End: 2018-06-02
  Administered 2018-06-03: 03:00:00 via SUBCUTANEOUS

## 2018-06-03 MED ORDER — ADV ADDAPTOR
3.375 gram | Freq: Three times a day (TID) | Status: DC
Start: 2018-06-03 — End: 2018-06-08
  Administered 2018-06-03 – 2018-06-08 (×17): via INTRAVENOUS

## 2018-06-03 MED ORDER — PREGABALIN 75 MG CAP
75 mg | Freq: Two times a day (BID) | ORAL | Status: DC
Start: 2018-06-03 — End: 2018-06-08
  Administered 2018-06-03 – 2018-06-08 (×11): via ORAL

## 2018-06-03 MED ORDER — ACETAMINOPHEN 325 MG TABLET
325 mg | ORAL | Status: DC | PRN
Start: 2018-06-03 — End: 2018-06-08

## 2018-06-03 MED ORDER — ATORVASTATIN 20 MG TAB
20 mg | Freq: Every day | ORAL | Status: DC
Start: 2018-06-03 — End: 2018-06-06
  Administered 2018-06-03 – 2018-06-07 (×4): via ORAL

## 2018-06-03 MED ORDER — CYANOCOBALAMIN 500 MCG TAB
500 mcg | Freq: Every day | ORAL | Status: DC
Start: 2018-06-03 — End: 2018-06-08
  Administered 2018-06-03 – 2018-06-08 (×6): via ORAL

## 2018-06-03 MED ORDER — VANCOMYCIN 10 GRAM IV SOLR
10 gram | Freq: Once | INTRAVENOUS | Status: AC
Start: 2018-06-03 — End: 2018-06-03
  Administered 2018-06-03: 04:00:00 via INTRAVENOUS

## 2018-06-03 MED ORDER — SODIUM CHLORIDE 0.9 % IV
INTRAVENOUS | Status: DC
Start: 2018-06-03 — End: 2018-06-05
  Administered 2018-06-03 – 2018-06-05 (×6): via INTRAVENOUS

## 2018-06-03 MED ORDER — DULOXETINE 30 MG CAP, DELAYED RELEASE
30 mg | Freq: Every day | ORAL | Status: DC
Start: 2018-06-03 — End: 2018-06-08
  Administered 2018-06-03 – 2018-06-08 (×6): via ORAL

## 2018-06-03 MED ORDER — PHARMACY VANCOMYCIN NOTE
Freq: Once | Status: AC
Start: 2018-06-03 — End: 2018-06-04
  Administered 2018-06-04: 14:00:00

## 2018-06-03 MED FILL — VANCOMYCIN 10 GRAM IV SOLR: 10 gram | INTRAVENOUS | Qty: 2000

## 2018-06-03 MED FILL — BD POSIFLUSH NORMAL SALINE 0.9 % INJECTION SYRINGE: INTRAMUSCULAR | Qty: 40

## 2018-06-03 MED FILL — SODIUM CHLORIDE 0.9 % IV: INTRAVENOUS | Qty: 250

## 2018-06-03 MED FILL — CYANOCOBALAMIN 500 MCG TAB: 500 mcg | ORAL | Qty: 1

## 2018-06-03 MED FILL — DULOXETINE 30 MG CAP, DELAYED RELEASE: 30 mg | ORAL | Qty: 2

## 2018-06-03 MED FILL — ENOXAPARIN 40 MG/0.4 ML SUB-Q SYRINGE: 40 mg/0.4 mL | SUBCUTANEOUS | Qty: 0.4

## 2018-06-03 MED FILL — PIPERACILLIN-TAZOBACTAM 3.375 GRAM IV SOLR: 3.375 gram | INTRAVENOUS | Qty: 3.38

## 2018-06-03 MED FILL — PREGABALIN 75 MG CAP: 75 mg | ORAL | Qty: 4

## 2018-06-03 MED FILL — PHARMACY VANCOMYCIN NOTE: Qty: 1

## 2018-06-03 MED FILL — HEPARIN (PORCINE) 5,000 UNIT/ML IJ SOLN: 5000 unit/mL | INTRAMUSCULAR | Qty: 1

## 2018-06-03 MED FILL — VANCOMYCIN 10 GRAM IV SOLR: 10 gram | INTRAVENOUS | Qty: 2500

## 2018-06-03 MED FILL — TOTAL B/C TABLET: ORAL | Qty: 1

## 2018-06-03 MED FILL — ATORVASTATIN 20 MG TAB: 20 mg | ORAL | Qty: 4

## 2018-06-03 NOTE — Consults (Signed)
[facility]   Podiatric Surgery  H & P Note    Date: June 03, 2018  Patient: Elijah [name]  MR #: [id number]  CSN #: [id number]  Attending/Admitting Physician: Dr Frankey Shown, MD    Reason for Consult:  Dr Frankey Shown, MD asked me to see Elijah [name] for evaluation and treatment of right foot cellulitis and ulcer.    History of Present Illness:  Patient is a 68 y.o. male presents with the above complaint. Patient presented to the ED on 06/02/2018 due to right foot pain and cellulitis. Patient states that he had what appeared to be a cyst on the bottom of his right foot a few weeks ago. He states that he does not recall any injury. Patient was endorsing fatigue, lethargy at that time. 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 Patient denies any other pedal concerns at this time.    ROS:   Constitutional: Negative for fever, chills,and malaise/fatigue. POSITIVE FOR WEIGHT LOSS   HENT: Negative for nosebleeds and congestion.    Eyes: Negative for blurred vision and double vision.   Respiratory: Negative for cough, shortness of breath and wheezing.    Cardiovascular: Negative for chest pain, palpitations, claudication and leg swelling.   Gastrointestinal: Negative for heartburn, nausea, vomiting, abdominal pain and diarrhea.   Genitourinary: Negative for dysuria and urgency.   Musculoskeletal: Negative for myalgias and joint pain.   Skin: POSITIVE FOR ULCERS  Neurological: Negative for dizziness, focal weakness and headaches.   Endo/Heme/Allergies: Negative for environmental allergies. Does not bruise/bleed easily.   Psychiatric/Behavioral: Negative for depression and suicidal ideas. The patient is not nervous/anxious.     Blood pressure 141/73, pulse (!) 56, temperature 98.1 ??F (36.7 ??C), resp.  rate 18, height 6\' 1"  (1.854 m), weight 131 kg (288 lb 12.8 oz), SpO2 97 %.    Intake/Output Summary (Last 24 hours) at 06/03/2018 1334  Last data filed at 06/02/2018 2200  Gross per 24 hour   Intake 112.5 ml   Output ???   Net 112.5 ml         Medical History:  Past Medical History:   Diagnosis Date   ??? BPH (benign prostatic hyperplasia)    ??? CAD (coronary artery disease)     CABG x 5 2011- nuke with [place] basal inferior [place]   ??? History of vascular access device 09/08/2016    [facility] VAT, 5 FR double, R brachial, 48 cm with 1 cm out, LTA   ??? HLD (hyperlipidemia)    ??? HTN (hypertension)    ??? Morbid obesity due to excess calories (HCC) 04/24/2015   ??? OSA (obstructive sleep apnea)    ??? Polyneuropathy    ??? Tubular adenoma of colon     x2 on colonoscopy 09/18/15     Past Surgical History:   Procedure Laterality Date   ??? HX HIP REPLACEMENT Bilateral      @PTAMEDS @  Allergies   Allergen Reactions   ??? Metoprolol Other (comments)     Patient states, made me feel horrible.     Family History   Problem Relation Age of Onset   ??? Stroke Mother    ??? Other Father         mrsa infxn caused death   ??? Heart  Attack Brother      Social History     Tobacco Use   Smoking Status Former Smoker   ??? Types: Cigarettes   Smokeless Tobacco Never Used     Social History     Substance and Sexual Activity   Drug Use No     Social History     Substance and Sexual Activity   Alcohol Use No    Comment: recently has stopped       Labs:  Lab Results   Component Value Date/Time    WBC 4.3 06/03/2018 05:43 AM    HGB 10.7 (L) 06/03/2018 05:43 AM    HCT 32.5 (L) 06/03/2018 05:43 AM    MCV 94.8 06/03/2018 05:43 AM    PLATELET 150 06/03/2018 05:43 AM     Lab Results   Component Value Date/Time    Sodium 144 06/03/2018 05:43 AM    Potassium 3.6 06/03/2018 05:43 AM    Chloride 115 (H) 06/03/2018 05:43 AM    CO2 24 06/03/2018 05:43 AM    Phosphorus 3.9 06/03/2018 05:43 AM    BUN 13 06/03/2018 05:43 AM    Calcium 8.0 (L) 06/03/2018 05:43 AM       Lab Results   Component Value Date/Time    Glucose 73 06/03/2018 05:43 AM     Lab Results   Component Value Date/Time    INR 1.1 09/07/2016 11:02 AM    No components found for: PTT  Recent Labs     06/03/18  0543 06/02/18  1739   GLU 73 113*       Physical Exam:  General appearance: alert, cooperative, no distress, appears stated age      Lower Extremity Exam:  Vascular:    Dorsalis Pedis Pulse: present  Posterior Tibialis Pulse: , present  Skin Temp: Increased RIGHT foot  Extremity Edema: +2 pitting edema  Varicosities: ABSENT    Neurological:  Deep Tendon Reflexes of Achilles and Patellar:   Epicritic and Protective Sensations: Diminished to bilateral feet  Deep Pain Response: decreased    Dermatological:  Ulceration: RIGHT plantar midfoot ulceration with overlying hyperkeratoic tissue and erythema;   LEFT sub 5th metatarsal head - ulcer measuring approximately 1.2cm x 1cm x 0.3cm with fibrotic tissue and non-viable soft tissue; no frank pus is noted on compression    Musculoskeletal:  Gait and Station: antalgic  Muscle Strength of Major Muscle Groups: WNL    Imaging Modalities:   MRI of bilateral feet - pending    Impression:  1.  Cellulitis right foot  2. Ulcer right foot with exposed muscle tissue  3. Ulcer left foot with exposed muscle tissue  4. Peripheral neuropathy    Plan:  Patient seen and evaluated at bedside and in NAD.  Recommended bedside debridement of bilateral ulcerations and patient was agreeable. Verbal consent was given by the patient.   After aseptic preparation of bilateral feet a #15 blade, forceps, and scissors was utilized and bilateral ulcers were excisionally debrided down to and including muscle tissue. Culture obtained from the right foot ulcer. Dressings applied consisting of betadine, dry gauze, kerlix.    The nature of the finding, probable diagnosis and likely treatment was thoroughly discussed with the patient. The options, risks, complications,  alternative treatment as well as some of the differential diagnosis was discussed. The patient was thoroughly informed and all questions were answered. the patient indicated understanding and satisfaction with the discussion.     MRI pending.  Will keep patient  NPO after midnight.  Discussed with patient possible surgical intervention once the MRI is completed depending on the results and patient related an understanding.   Continue antibiotics.  Keep dressings C/D/I.   Call with questions.      Kamila Broda A. Garald BaldingShah, DPM  [phone]  06/03/2018  1:34 PM

## 2018-06-03 NOTE — Progress Notes (Signed)
[facility] Dosing Services: Antimicrobial Stewardship Progress Note    Consult for antibiotic dosing of Vancomycin by Dr. Womitso  Pharmacist reviewed antibiotic appropriateness for 67 year old , male  for indication of Cellulitis of the right foot  Day of Therapy 1    Plan:  Vancomycin therapy:  Start Vancomycin therapy, with loading dose of 2.5 grams  Follow with maintenance dose of 2 grams every 12 hours  Dose calculated to approximate a therapeutic trough of ~15 mcg/mL.    Last trough level / Plan for level: before 3rd dose  Pharmacy to follow daily and will make changes to dose and/or frequency based on clinical status.       Other Antimicrobial  (not dosed by pharmacist)   Zosyn   Serum Creatinine     Lab Results   Component Value Date/Time    Creatinine 1.10 06/02/2018 05:39 PM       Creatinine Clearance Estimated Creatinine Clearance: 89.9 mL/min (based on SCr of 1.1 mg/dL).     WBC   Lab Results   Component Value Date/Time    WBC 5.9 06/02/2018 05:39 PM       H/H   Lab Results   Component Value Date/Time    HGB 11.1 (L) 06/02/2018 05:39 PM      Platelets Lab Results   Component Value Date/Time    PLATELET 186 06/02/2018 05:39 PM            Pharmacist: Signed Rachel C Sloan Contact information: [phone]

## 2018-06-03 NOTE — Other (Signed)
TRANSFER - IN REPORT:    Verbal report received from Teresiter, RN on Elijah [name]  being received from ED for routine progression of care.      Report consisted of patient???s Situation, Background, Assessment and   Recommendations(SBAR).     Information from the following report(s) SBAR, Kardex, Recent Results and Med Rec Status was reviewed with the receiving nurse.    Opportunity for questions and clarification was provided.      Assessment completed upon patient???s arrival to unit and care assumed.

## 2018-06-03 NOTE — Other (Addendum)
Bedside and Verbal shift change report given to Nelwyn Salisbury, RN by Tressie Stalker, RN. Report included the following information SBAR, Kardex, Recent Results and Med Rec Status.

## 2018-06-03 NOTE — Progress Notes (Signed)
[place] [address]   Office [phone]  Fax [phone]          Assessment and Plan   Elijah [name] is a 68 y.o. male who is admitted for Rt foot wound with known polyneuropathy, OSAS (CPAP), HTN HLD, CAD with CABG x5 in 2011, Obesity and s/p Gastric Sleeve in 01/2018 .    24 Hour Events: NAEO    ??  ACUTE PROBLEMS   Rt foot wound- h/o osteo involving Rt foot, continues to follow with podiatry at [facility]. x2 weeks of painful sore on Rt foot.  No abx given outpatient.  POA elevated CRP to 5.4 and ESR elevated to 58. LA wnl, XR- no fx and no osteo. BLE Doppler negative for DVT  -I&O, NPO at midnight, MIVF at 114m/hr   - Vanc and Zosyn for broad spectrum coverage   -f/u blood and wound cx   -daily CBC/CMP/mag/phos   -f/u MRI foot   - c/s podiatry- appreciate recs   -c/s wound care- appreciate recs   -c/s PT/OT   ??  Hypotension in the setting of HTN: BP in PCP office today 107/66 and Recent 70 Lb weight loss from gastric bypass. POA BP  118/66.  Iron profile, TSH and B12 from 3/2 wnl.   -hold home Bystolic 102VOdaily   -f/u orthostatics   -f/u echo   -daily CBC/CMP/mag/phos   -Will continue to monitor at this time and readjust as BP's trend.  ??  CHRONIC PROBLEMS   ??  S/p Gastric Sleeve: 70 lb wt loss since 11/19   -continue home B complex   -continue home B12   ??  Hyperlipidemia:??Last lipid panel on 1/20 and values were TC- 118, TG- 119,  HDL-41, LDL-53,   -Continue home Lipitor 440mdaily   ??  Polyneuropathy: chronic bilateral below the knee   -continue home Lyrica 30046mid   -continue home Cymbalta 10m53mce daily   ??  Sleep Apnea:   -continue CPAP qhs   ??  Obesity BMI Body mass index is 38.1 kg/m??.  - Encouraging lifestyle modifications and further follow up outpatient.   ??  ??  FEN/GI - NPO after midnight. NS at 150 mL/hr.  Activity - Ambulate as tolerated  DVT prophylaxis - SCDs until after podiatry see's Pt.  If no surgery then start Lovenox 40mg66mh   GI prophylaxis - Not indicated at this time  Fall prophylaxis - Fall precautions ordered.  Disposition - Admit to Remote Telemetry. Plan to d/c to Home. Consulting PT/OT/CM  Code Status - Full, discussed with patient / caregivers.  Next of Kin NDrexel and Contact Wife Elijah [name] Patient HowarMAKALE [name] be discussed Elijah [name]?  I appreciate the opportunity to participate in the care of this patient,  Elijah [name] Family Medicine Resident         Subjective / Objective     Subjective No f/c, diaphoresis, n/v/d, chest pain, SOB, palpitations, dysuria, constipation, pain in lower extremities.    Temp (24hrs), Avg:97.9 ??F (36.6 ??C), Min:97.4 ??F (36.3 ??C), Max:98.6 ??F (37 ??C)     Objective  Respiratory:   O2 Device: Room air     General: No acute distress. Alert. Cooperative.   Head: Normocephalic.  Healing scalp wound to Rt forehead with small amount of drainage (see admission H&P photos)   Eyes:?????????????????????????? Conjunctiva pink. Sclera white. PERRL.   Ears:?????????????????????????? Ear canals  patent. TM non-erythematous.   Nose:???????????????????????? Septum midline. Mucosa pink. No drainage.   Throat: Mucosa pink. Moist mucous membranes. No tonsillar exudates or erythema. Palate movement equal bilaterally.   Neck: Supple. Normal ROM. No stiffness.   Respiratory: CTAB. No w/r/r/c.   Cardiovascular: RRR. Normal S1,S2. No m/r/g. Pulses 2+ throughout.   GI: + bowel sounds. Nontender. No rebound tenderness or guarding. Nondistended.   Extremities: Non pitting BL LE edema and erythema and slight warmth to upper ankle of Rt foot. Distal pulses intact bilaterally    Musculoskeletal: Full ROM in all extremities.    Skin: Warm and dry, no rashes. Small 1-2cm wound to plantar aspect Rt medial foot with presence of drainage on dressing.  (see photos of new Rt foot wound and chronic Lt foot wound in admission H&P)    Neuro: CN II-XII grossly intact. Strength 5/5 in all extremities.        I/O:   Date 06/02/18 0700 - 06/03/18 0659 06/03/18 0700 - 06/04/18 0659   Shift 0700-1859 1900-0659 24 Hour Total 0700-1859 1900-0659 24 Hour Total   INTAKE   I.V.(mL/kg/hr)  112.5 112.5        Volume (0.9% sodium chloride infusion)  12.5 12.5        Volume (piperacillin-tazobactam (ZOSYN) 3.375 g in 0.9% sodium chloride (MBP/ADV) 100 mL)  100 100      Shift Total(mL/kg)  112.5(0.9) 112.5(0.9)      OUTPUT   Shift Total(mL/kg)         NET  112.5 112.5      Weight (kg) 131 131 131 131 131 131       Inpatient Medications  Current Facility-Administered Medications   Medication Dose Route Frequency   ??? vancomycin (VANCOCIN) 2,000 mg in 0.9% sodium chloride 500 mL IVPB  2,000 mg IntraVENous Q12H   ??? sodium chloride (NS) flush 5-40 mL  5-40 mL IntraVENous Q8H   ??? sodium chloride (NS) flush 5-40 mL  5-40 mL IntraVENous PRN   ??? 0.9% sodium chloride infusion  150 mL/hr IntraVENous CONTINUOUS   ??? acetaminophen (TYLENOL) tablet 650 mg  650 mg Oral Q4H PRN   ??? piperacillin-tazobactam (ZOSYN) 3.375 g in 0.9% sodium chloride (MBP/ADV) 100 mL  3.375 g IntraVENous Q8H   ??? atorvastatin (LIPITOR) tablet 80 mg  80 mg Oral DAILY   ??? b-complex with vitamin c tablet 1 Tab  1 Tab Oral DAILY   ??? cyanocobalamin (VITAMIN B12) tablet 500 mcg  500 mcg Oral DAILY   ??? DULoxetine (CYMBALTA) capsule 60 mg  60 mg Oral DAILY   ??? pregabalin (LYRICA) capsule 300 mg  300 mg Oral BID         Allergies  Allergies   Allergen Reactions   ??? Metoprolol Other (comments)     Patient states, made me feel horrible.         CBC:  Recent Labs     06/02/18  1739   WBC 5.9   HGB 11.1*   HCT 33.6*   PLT 161       Metabolic Panel:  Recent Labs     06/02/18  1739   NA 139   K 3.8   CL 109*   CO2 26   BUN 15   CREA 1.10   GLU 113*   CA 8.7   ALB 3.1*   SGOT 23   ALT 23         XR Rt foot   IMPRESSION: Deformity of the  right first and second MTP joints is unchanged. No  acute fracture identified..       For Billing    Chief Complaint   Patient presents with   ??? Fatigue    ??? Foot Problem       Hospital Problems  Date Reviewed: June 19, 2018          Codes Class Noted POA    Cellulitis ICD-10-CM: L03.90  ICD-9-CM: 682.9  06/19/2018 Unknown        Right foot infection ICD-10-CM: L08.9  ICD-9-CM: 686.9  2018-06-19 Unknown

## 2018-06-03 NOTE — Progress Notes (Signed)
Bedside shift change report given to Efi, RN (oncoming nurse) by Yomara, RN (offgoing nurse). Report included the following information SBAR, Kardex, MAR, Accordion and Recent Results.

## 2018-06-03 NOTE — Other (Signed)
Primary Nurse Sheilah Pigeon, RN and Young Berry, RN performed a dual skin assessment on this patient Impairment noted- see wound doc flow sheet. Braden score is 19.

## 2018-06-03 NOTE — Other (Signed)
Bedside and Verbal shift change report given to Ufei, RN by Ify, RN. Report included the following information SBAR, Kardex, Recent Results and Med Rec Status.

## 2018-06-03 NOTE — Consults (Signed)
[facility]   Podiatric Surgery  H & P Note    Date: June 03, 2018  Patient: Elijah [name]  MR #: [id number]  CSN #: [id number]  Attending/Admitting Physician: Dr Frankey Shown, MD    Reason for Consult:  Dr Frankey Shown, MD asked me to see [name] for evaluation and treatment of right foot cellulitis and ulcer.    History of Present Illness:  Patient is a 68 y.o. male presents with the above complaint. Patient presented to the ED on 06/02/2018 due to right foot pain and cellulitis. Patient states that he had what appeared to be a cyst on the bottom of his right foot a few weeks ago. He states that he does not recall any injury. Patient was endorsing fatigue, lethargy at that time. 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 Patient denies any other pedal concerns at this time.    ROS:   Constitutional: Negative for fever, chills,and malaise/fatigue. POSITIVE FOR WEIGHT LOSS   HENT: Negative for nosebleeds and congestion.    Eyes: Negative for blurred vision and double vision.   Respiratory: Negative for cough, shortness of breath and wheezing.    Cardiovascular: Negative for chest pain, palpitations, claudication and leg swelling.   Gastrointestinal: Negative for heartburn, nausea, vomiting, abdominal pain and diarrhea.   Genitourinary: Negative for dysuria and urgency.   Musculoskeletal: Negative for myalgias and joint pain.   Skin: POSITIVE FOR ULCERS  Neurological: Negative for dizziness, focal weakness and headaches.   Endo/Heme/Allergies: Negative for environmental allergies. Does not bruise/bleed easily.   Psychiatric/Behavioral: Negative for depression and suicidal ideas. The patient is not nervous/anxious.     Blood pressure 141/73, pulse (!) 56, temperature 98.1 ??F (36.7 ??C), resp. rate 18, height 6\' 1"  (1.854 m), weight 131 kg  (288 lb 12.8 oz), SpO2 97 %.    Intake/Output Summary (Last 24 hours) at 06/03/2018 1334  Last data filed at 06/02/2018 2200  Gross per 24 hour   Intake 112.5 ml   Output ???   Net 112.5 ml         Medical History:  Past Medical History:   Diagnosis Date   ??? BPH (benign prostatic hyperplasia)    ??? CAD (coronary artery disease)     CABG x 5 2011- [facility] with [place] basal [place]   ??? History of vascular access device 09/08/2016    [facility] VAT, 5 FR double, R brachial, 48 cm with 1 cm out, LTA   ??? HLD (hyperlipidemia)    ??? HTN (hypertension)    ??? Morbid obesity due to excess calories (HCC) 04/24/2015   ??? OSA (obstructive sleep apnea)    ??? Polyneuropathy    ??? Tubular adenoma of colon     x2 on colonoscopy 09/18/15     Past Surgical History:   Procedure Laterality Date   ??? HX HIP REPLACEMENT Bilateral      @PTAMEDS @  Allergies   Allergen Reactions   ??? Metoprolol Other (comments)     Patient states, made me feel horrible.     Family History   Problem Relation Age of Onset   ??? Stroke Mother    ??? Other Father         mrsa infxn caused death   ??? Heart  Attack Brother      Social History     Tobacco Use   Smoking Status Former Smoker   ??? Types: Cigarettes   Smokeless Tobacco Never Used     Social History     Substance and Sexual Activity   Drug Use No     Social History     Substance and Sexual Activity   Alcohol Use No    Comment: recently has stopped       Labs:  Lab Results   Component Value Date/Time    WBC 4.3 06/03/2018 05:43 AM    HGB 10.7 (L) 06/03/2018 05:43 AM    HCT 32.5 (L) 06/03/2018 05:43 AM    MCV 94.8 06/03/2018 05:43 AM    PLATELET 150 06/03/2018 05:43 AM     Lab Results   Component Value Date/Time    Sodium 144 06/03/2018 05:43 AM    Potassium 3.6 06/03/2018 05:43 AM    Chloride 115 (H) 06/03/2018 05:43 AM    CO2 24 06/03/2018 05:43 AM    Phosphorus 3.9 06/03/2018 05:43 AM    BUN 13 06/03/2018 05:43 AM    Calcium 8.0 (L) 06/03/2018 05:43 AM      Lab Results   Component Value Date/Time    Glucose  73 06/03/2018 05:43 AM     Lab Results   Component Value Date/Time    INR 1.1 09/07/2016 11:02 AM    No components found for: PTT  Recent Labs     06/03/18  0543 06/02/18  1739   GLU 73 113*       Physical Exam:  General appearance: alert, cooperative, no distress, appears stated age      Lower Extremity Exam:  Vascular:    Dorsalis Pedis Pulse: present  Posterior Tibialis Pulse: , present  Skin Temp: Increased RIGHT foot  Extremity Edema: +2 pitting edema  Varicosities: ABSENT    Neurological:  Deep Tendon Reflexes of Achilles and Patellar:   Epicritic and Protective Sensations: Diminished to bilateral feet  Deep Pain Response: decreased    Dermatological:  Ulceration: RIGHT plantar midfoot ulceration with overlying hyperkeratoic tissue and erythema;   LEFT sub 5th metatarsal head - ulcer measuring approximately 1.2cm x 1cm x 0.3cm with fibrotic tissue and non-viable soft tissue; no frank pus is noted on compression    Musculoskeletal:  Gait and Station: antalgic  Muscle Strength of Major Muscle Groups: WNL    Imaging Modalities:   MRI of bilateral feet - pending    Impression:  1.  Cellulitis right foot  2. Ulcer right foot with exposed muscle tissue  3. Ulcer left foot with exposed muscle tissue  4. Peripheral neuropathy    Plan:  Patient seen and evaluated at bedside and in NAD.  Recommended bedside debridement of bilateral ulcerations and patient was agreeable. Verbal consent was given by the patient.   After aseptic preparation of bilateral feet a #15 blade, forceps, and scissors was utilized and bilateral ulcers were excisionally debrided down to and including muscle tissue. Culture obtained from the right foot ulcer. Dressings applied consisting of betadine, dry gauze, kerlix.    The nature of the finding, probable diagnosis and likely treatment was thoroughly discussed with the patient. The options, risks, complications, alternative treatment as well as some of the differential diagnosis was discussed. The  patient was thoroughly informed and all questions were answered. the patient indicated understanding and satisfaction with the discussion.     MRI pending.  Will keep patient  NPO after midnight.  Discussed with patient possible surgical intervention once the MRI is completed depending on the results and patient related an understanding.   Continue antibiotics.  Keep dressings C/D/I.   Call with questions.      Kamila Broda A. Garald BaldingShah, DPM  [phone]  06/03/2018  1:34 PM

## 2018-06-03 NOTE — Progress Notes (Signed)
Progress Notes by Lovey Newcomer, DO at 06/03/18 3557                Author: Lovey Newcomer, DO  Service: FAMILY MEDICINE  Author Type: Resident       Filed: 06/03/18 0659  Date of Service: 06/03/18 0334  Status: Attested           Editor: Lovey Newcomer, DO (Resident)  Cosigner: Oren Bracket, MD at 06/05/18 [phone]          Attestation signed by Oren Bracket, MD at 06/05/18 [phone]          See attending attestation note on PGY1 History and Physical. Oren Bracket, MD 06/03/2018                                           [place] [address]    Office [phone]   Fax [phone]                Assessment and Plan     Elijah [name] is a 68 y.o. male who is admitted for Rt foot wound with known polyneuropathy, OSAS (CPAP), HTN HLD, CAD with CABG x5 in 2011, Obesity and s/p Gastric Sleeve  in 01/2018 .      24 Hour Events: NAEO      ??   ACUTE PROBLEMS    Rt foot wound- h/o osteo involving Rt foot, continues to follow with podiatry at [facility]. x2 weeks of painful sore on Rt foot.  No  abx given outpatient.  POA elevated CRP to 5.4 and ESR elevated to 58. LA wnl, XR- no fx and no osteo. BLE Doppler negative for DVT   -I&O, NPO at midnight, MIVF at 158m/hr    - Vanc and Zosyn for broad spectrum coverage    -f/u blood and wound cx    -daily CBC/CMP/mag/phos    -f/u MRI foot    - c/s podiatry- appreciate recs    -c/s wound care- appreciate recs    -c/s PT/OT    ??   Hypotension in the setting of HTN: BP in PCP office today 107/66 and Recent 70 Lb weight loss from gastric bypass. POA BP  118/66.  Iron  profile, TSH and B12 from 3/2 wnl.    -hold home Bystolic 169SWdaily    -f/u orthostatics    -f/u echo    -daily CBC/CMP/mag/phos    -Will continue to monitor at this time and readjust as BP's trend.   ??   CHRONIC PROBLEMS    ??   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   Hyperlipidemia:??Last lipid  panel on 1/20 and values were TC- 118, TG- 119,  HDL-41, LDL-53,    -Continue home Lipitor 474mdaily    ??   Polyneuropathy: chronic bilateral below the knee    -continue home Lyrica 30067mid    -continue home Cymbalta 70m29mce daily    ??   Sleep Apnea:    -continue CPAP qhs    ??   Obesity BMI Body mass index is 38.1 kg/m??.   - Encouraging lifestyle modifications and further follow up outpatient.    ??   ??   FEN/GI - NPO after  midnight. NS at 150 mL/hr.   Activity - Ambulate as tolerated   DVT prophylaxis - SCDs until after podiatry see's Pt.  If no surgery then start Lovenox 63m q12h   GI prophylaxis - Not indicated at this time   Fall prophylaxis - Fall precautions ordered.   Disposition - Admit to Remote Telemetry. Plan to d/c to Home. Consulting PT/OT/CM   Code Status - Full, discussed with patient / caregivers.   Next of [place] and Contact Wife MJacqulyn [phone]  ??   Patient Elijah DESROCHESwill be discussed Dr. PPrince Rome    ??   I appreciate the opportunity to participate in the care of this patient,   KLovey Newcomer DO   Family Medicine Resident              Subjective / Objective        Subjective No f/c, diaphoresis, n/v/d, chest pain, SOB, palpitations, dysuria, constipation, pain in lower extremities.      Temp (24hrs), Avg:97.9 ??F (36.6 ??C), Min:97.4 ??F (36.3 ??C), Max:98.6 ??F (37 ??C)       Objective   Respiratory:   O2 Device: Room air          General:  No acute distress. Alert. Cooperative.     Head:  Normocephalic.  Healing scalp wound to Rt forehead with small amount of drainage (see  admission H&P photos)     Eyes:??????????????????????????  Conjunctiva pink. Sclera white. PERRL.     Ears:??????????????????????????  Ear canals patent. TM non-erythematous.     Nose:????????????????????????  Septum midline. Mucosa pink. No drainage.     Throat:  Mucosa pink. Moist mucous membranes. No tonsillar exudates or erythema. Palate movement equal bilaterally.     Neck:  Supple. Normal ROM. No stiffness.     Respiratory:  CTAB. No  w/r/r/c.     Cardiovascular:  RRR. Normal S1,S2. No m/r/g. Pulses 2+ throughout.     GI:  + bowel sounds. Nontender. No rebound tenderness or guarding. Nondistended.     Extremities:  Non pitting BL LE edema and erythema and slight warmth to upper ankle of Rt foot. Distal pulses intact bilaterally         Musculoskeletal:  Full ROM in all extremities.         Skin:  Warm and dry, no rashes. Small 1-2cm wound to plantar aspect Rt medial foot with presence  of drainage on dressing.  (see photos of new Rt foot wound and chronic Lt foot wound in admission H&P)      Neuro:  CN II-XII grossly intact. Strength 5/5 in all extremities.            I/O:       Date  06/02/18 0700 - 06/03/18 0659  06/03/18 0700 - 06/04/18 0659             Shift  0700-1859  1900-0659  24 Hour Total  0700-1859  1900-0659  24 Hour Total       INTAKE             I.V.(mL/kg/hr)    112.5  112.5                     Volume (0.9% sodium chloride infusion)    12.5  12.5                     Volume (piperacillin-tazobactam (ZOSYN) 3.375 g in 0.9% sodium chloride (MBP/ADV) 100 mL)  100  100                   Shift Total(mL/kg)    112.5(0.9)  112.5(0.9)             OUTPUT             Shift Total(mL/kg)                         NET    112.5  112.5                   Weight (kg)  131  131  131  131  131  131           Inpatient Medications     Current Facility-Administered Medications          Medication  Dose  Route  Frequency           ?  vancomycin (VANCOCIN) 2,000 mg in 0.9% sodium chloride 500 mL IVPB   2,000 mg  IntraVENous  Q12H     ?  sodium chloride (NS) flush 5-40 mL   5-40 mL  IntraVENous  Q8H     ?  sodium chloride (NS) flush 5-40 mL   5-40 mL  IntraVENous  PRN     ?  0.9% sodium chloride infusion   150 mL/hr  IntraVENous  CONTINUOUS     ?  acetaminophen (TYLENOL) tablet 650 mg   650 mg  Oral  Q4H PRN     ?  piperacillin-tazobactam (ZOSYN) 3.375 g in 0.9% sodium chloride (MBP/ADV) 100 mL   3.375 g  IntraVENous  Q8H     ?  atorvastatin (LIPITOR)  tablet 80 mg   80 mg  Oral  DAILY     ?  b-complex with vitamin c tablet 1 Tab   1 Tab  Oral  DAILY     ?  cyanocobalamin (VITAMIN B12) tablet 500 mcg   500 mcg  Oral  DAILY     ?  DULoxetine (CYMBALTA) capsule 60 mg   60 mg  Oral  DAILY           ?  pregabalin (LYRICA) capsule 300 mg   300 mg  Oral  BID              Allergies     Allergies        Allergen  Reactions         ?  Metoprolol  Other (comments)             Patient states, made me feel horrible.              CBC:     Recent Labs           06/02/18   1739     WBC  5.9     HGB  11.1*     HCT  33.6*        PLT  186           Metabolic Panel:     Recent Labs           06/02/18   1739     NA  139     K  3.8     CL  109*     CO2  26     BUN  15     CREA  1.10     GLU  113*  CA  8.7     ALB  3.1*     SGOT  23        ALT  23              XR Rt foot    IMPRESSION: Deformity of the right first and second MTP joints is unchanged. No   acute fracture identified..              For Billing          Chief Complaint      Patient presents with      ?  Fatigue      ?  Foot Problem               Hospital Problems   Date Reviewed:  06/14/18                 Codes  Class  Noted  POA        Cellulitis  ICD-10-CM: L03.90   ICD-9-CM: 682.9    June 14, 2018  Unknown                Right foot infection  ICD-10-CM: L08.9   ICD-9-CM: 686.9    06/14/2018  Unknown

## 2018-06-03 NOTE — Progress Notes (Signed)
[facility] Dosing Services: Antimicrobial Stewardship Progress Note    Consult for antibiotic dosing of Vancomycin by Dr. Trey Paula  Pharmacist reviewed antibiotic appropriateness for 68 year old , male  for indication of Cellulitis of the right foot  Day of Therapy 1    Plan:  Vancomycin therapy:  Start Vancomycin therapy, with loading dose of 2.5 grams  Follow with maintenance dose of 2 grams every 12 hours  Dose calculated to approximate a therapeutic trough of ~15 mcg/mL.    Last trough level / Plan for level: before 3rd dose  Pharmacy to follow daily and will make changes to dose and/or frequency based on clinical status.       Other Antimicrobial  (not dosed by pharmacist)   Zosyn   Serum Creatinine     Lab Results   Component Value Date/Time    Creatinine 1.10 06/02/2018 05:39 PM       Creatinine Clearance Estimated Creatinine Clearance: 89.9 mL/min (based on SCr of 1.1 mg/dL).     WBC   Lab Results   Component Value Date/Time    WBC 5.9 06/02/2018 05:39 PM       H/H   Lab Results   Component Value Date/Time    HGB 11.1 (L) 06/02/2018 05:39 PM      Platelets Lab Results   Component Value Date/Time    PLATELET 186 06/02/2018 05:39 PM            Pharmacist: Signed Lynnae Prude Contact information: [phone]

## 2018-06-03 NOTE — Progress Notes (Signed)
Bedside shift change report given to Efi, RN (oncoming nurse) by Nelwyn Salisbury, RN (offgoing nurse). Report included the following information SBAR, Kardex, MAR, Accordion and Recent Results.

## 2018-06-04 ENCOUNTER — Inpatient Hospital Stay: Admit: 2018-06-04 | Payer: MEDICARE | Primary: Family Medicine

## 2018-06-04 DIAGNOSIS — L02611 Cutaneous abscess of right foot: Secondary | ICD-10-CM | POA: Insufficient documentation

## 2018-06-04 DIAGNOSIS — M86172 Other acute osteomyelitis, left ankle and foot: Secondary | ICD-10-CM | POA: Insufficient documentation

## 2018-06-04 LAB — BASIC METABOLIC PANEL
Anion Gap: 6 mmol/L (ref 5–15)
BUN: 8 MG/DL (ref 6–20)
Bun/Cre Ratio: 8 — ABNORMAL LOW (ref 12–20)
CO2: 22 mmol/L (ref 21–32)
Calcium: 7.8 MG/DL — ABNORMAL LOW (ref 8.5–10.1)
Chloride: 114 mmol/L — ABNORMAL HIGH (ref 97–108)
Creatinine: 0.96 MG/DL (ref 0.70–1.30)
EGFR IF NonAfrican American: >60 mL/min/{1.73_m2} (ref >60)
GFR African American: >60 mL/min/{1.73_m2} (ref >60)
Glucose: 118 mg/dL — ABNORMAL HIGH (ref 65–100)
Potassium: 3.6 mmol/L (ref 3.5–5.1)
Sodium: 142 mmol/L (ref 136–145)

## 2018-06-04 LAB — CBC
Hematocrit: 31.7 % — ABNORMAL LOW (ref 36.6–50.3)
Hemoglobin: 10.4 g/dL — ABNORMAL LOW (ref 12.1–17.0)
MCH: 31.2 PG (ref 26.0–34.0)
MCHC: 32.8 g/dL (ref 30.0–36.5)
MCV: 95.2 FL (ref 80.0–99.0)
MPV: 11.4 FL (ref 8.9–12.9)
NRBC Absolute: 0 10*3/uL (ref 0.00–0.01)
Nucleated RBCs: 0 PER 100 WBC
Platelets: 166 10*3/uL (ref 150–400)
RBC: 3.33 M/uL — ABNORMAL LOW (ref 4.10–5.70)
RDW: 15.7 % — ABNORMAL HIGH (ref 11.5–14.5)
WBC: 5.6 10*3/uL (ref 4.1–11.1)

## 2018-06-04 LAB — VANCOMYCIN TROUGH: Vancomycin Tr: 18.8 ug/mL — ABNORMAL HIGH (ref 5.0–10.0)

## 2018-06-04 LAB — PHOSPHORUS
Phosphorus: 2.4 MG/DL — ABNORMAL LOW (ref 2.6–4.7)
Phosphorus: 2.4 MG/DL — ABNORMAL LOW (ref 2.6–4.7)

## 2018-06-04 LAB — MAGNESIUM
Magnesium: 1.9 mg/dL (ref 1.6–2.4)
Magnesium: 1.9 mg/dL (ref 1.6–2.4)

## 2018-06-04 LAB — CBC W/O DIFF
ABSOLUTE NRBC: 0 10*3/uL (ref 0.00–0.01)
HCT: 31.7 % — ABNORMAL LOW (ref 36.6–50.3)
HGB: 10.4 g/dL — ABNORMAL LOW (ref 12.1–17.0)
MCH: 31.2 PG (ref 26.0–34.0)
MCHC: 32.8 g/dL (ref 30.0–36.5)
MCV: 95.2 FL (ref 80.0–99.0)
MPV: 11.4 FL (ref 8.9–12.9)
NRBC: 0 PER 100 WBC
PLATELET: 166 10*3/uL (ref 150–400)
RBC: 3.33 M/uL — ABNORMAL LOW (ref 4.10–5.70)
RDW: 15.7 % — ABNORMAL HIGH (ref 11.5–14.5)
WBC: 5.6 10*3/uL (ref 4.1–11.1)

## 2018-06-04 LAB — VANCOMYCIN, TROUGH: Vancomycin,trough: 18.8 ug/mL — ABNORMAL HIGH (ref 5.0–10.0)

## 2018-06-04 LAB — METABOLIC PANEL, BASIC
Anion gap: 6 mmol/L (ref 5–15)
BUN/Creatinine ratio: 8 — ABNORMAL LOW (ref 12–20)
BUN: 8 MG/DL (ref 6–20)
CO2: 22 mmol/L (ref 21–32)
Calcium: 7.8 MG/DL — ABNORMAL LOW (ref 8.5–10.1)
Chloride: 114 mmol/L — ABNORMAL HIGH (ref 97–108)
Creatinine: 0.96 MG/DL (ref 0.70–1.30)
GFR est AA: >60 mL/min/{1.73_m2} (ref >60)
GFR est non-AA: >60 mL/min/{1.73_m2} (ref >60)
Glucose: 118 mg/dL — ABNORMAL HIGH (ref 65–100)
Potassium: 3.6 mmol/L (ref 3.5–5.1)
Sodium: 142 mmol/L (ref 136–145)

## 2018-06-04 MED ORDER — LIDOCAINE (PF) 20 MG/ML (2 %) IJ SOLN
20 mg/mL (2 %) | INTRAMUSCULAR | Status: AC
Start: 2018-06-04 — End: ?

## 2018-06-04 MED ORDER — DIPHENHYDRAMINE HCL 50 MG/ML IJ SOLN
50 mg/mL | INTRAMUSCULAR | Status: DC | PRN
Start: 2018-06-04 — End: 2018-06-04

## 2018-06-04 MED ORDER — PROPOFOL 10 MG/ML IV EMUL
10 mg/mL | INTRAVENOUS | Status: DC | PRN
Start: 2018-06-04 — End: 2018-06-04
  Administered 2018-06-04 (×3): via INTRAVENOUS

## 2018-06-04 MED ORDER — PROPOFOL 10 MG/ML IV EMUL
10 mg/mL | INTRAVENOUS | Status: AC
Start: 2018-06-04 — End: ?

## 2018-06-04 MED ORDER — EPHEDRINE (PF) 50 MG/5 ML (10 MG/ML) IN NS IV SYRINGE
50 mg/5 mL (10 mg/mL) | INTRAVENOUS | Status: DC | PRN
Start: 2018-06-04 — End: 2018-06-04
  Administered 2018-06-04 (×2): via INTRAVENOUS

## 2018-06-04 MED ORDER — LIDOCAINE (PF) 20 MG/ML (2 %) IJ SOLN
20 mg/mL (2 %) | INTRAMUSCULAR | Status: DC | PRN
Start: 2018-06-04 — End: 2018-06-04
  Administered 2018-06-04: 14:00:00 via INTRAVENOUS

## 2018-06-04 MED ORDER — FERROUS SULFATE 325 MG (65 MG ELEMENTAL IRON) TAB
325 mg (65 mg iron) | Freq: Two times a day (BID) | ORAL | Status: DC
Start: 2018-06-04 — End: 2018-06-08
  Administered 2018-06-05 – 2018-06-08 (×7): via ORAL

## 2018-06-04 MED ORDER — GLYCOPYRROLATE 0.2 MG/ML IJ SOLN
0.2 mg/mL | INTRAMUSCULAR | Status: AC
Start: 2018-06-04 — End: ?

## 2018-06-04 MED ORDER — MIDAZOLAM 1 MG/ML IJ SOLN
1 mg/mL | INTRAMUSCULAR | Status: AC
Start: 2018-06-04 — End: ?

## 2018-06-04 MED ORDER — FENTANYL CITRATE (PF) 50 MCG/ML IJ SOLN
50 mcg/mL | INTRAMUSCULAR | Status: DC | PRN
Start: 2018-06-04 — End: 2018-06-04
  Administered 2018-06-04 (×2): via INTRAVENOUS

## 2018-06-04 MED ORDER — MIDAZOLAM 1 MG/ML IJ SOLN
1 mg/mL | INTRAMUSCULAR | Status: DC | PRN
Start: 2018-06-04 — End: 2018-06-04
  Administered 2018-06-04: 14:00:00 via INTRAVENOUS

## 2018-06-04 MED ORDER — HYDROMORPHONE 0.5 MG/0.5 ML SYRINGE
0.5 mg/ mL | INTRAMUSCULAR | Status: DC | PRN
Start: 2018-06-04 — End: 2018-06-04

## 2018-06-04 MED ORDER — LACTATED RINGERS IV
INTRAVENOUS | Status: DC
Start: 2018-06-04 — End: 2018-06-04
  Administered 2018-06-04: 15:00:00 via INTRAVENOUS

## 2018-06-04 MED ORDER — GLYCOPYRROLATE 0.2 MG/ML IJ SOLN
0.2 mg/mL | INTRAMUSCULAR | Status: DC | PRN
Start: 2018-06-04 — End: 2018-06-04
  Administered 2018-06-04: 14:00:00 via INTRAVENOUS

## 2018-06-04 MED ORDER — LACTATED RINGERS IV
INTRAVENOUS | Status: DC
Start: 2018-06-04 — End: 2018-06-04
  Administered 2018-06-04: 14:00:00 via INTRAVENOUS

## 2018-06-04 MED ORDER — MEPERIDINE (PF) 25 MG/ML INJ SOLUTION
25 mg/ml | INTRAMUSCULAR | Status: DC | PRN
Start: 2018-06-04 — End: 2018-06-04

## 2018-06-04 MED ORDER — BUPIVACAINE (PF) 0.5 % (5 MG/ML) IJ SOLN
0.5 % (5 mg/mL) | INTRAMUSCULAR | Status: AC
Start: 2018-06-04 — End: ?

## 2018-06-04 MED ORDER — GADOTERATE MEGLUMINE 0.5 MMOL/ML IV SOLUTION
0.5 mmol/mL (376.9 mg/mL) | Freq: Once | INTRAVENOUS | Status: AC
Start: 2018-06-04 — End: 2018-06-03
  Administered 2018-06-04: 01:00:00 via INTRAVENOUS

## 2018-06-04 MED ORDER — ONDANSETRON (PF) 4 MG/2 ML INJECTION
4 mg/2 mL | INTRAMUSCULAR | Status: AC
Start: 2018-06-04 — End: ?

## 2018-06-04 MED ORDER — ALBUTEROL SULFATE 0.083 % (0.83 MG/ML) SOLN FOR INHALATION
2.5 mg /3 mL (0.083 %) | RESPIRATORY_TRACT | Status: DC | PRN
Start: 2018-06-04 — End: 2018-06-04

## 2018-06-04 MED ORDER — ONDANSETRON (PF) 4 MG/2 ML INJECTION
4 mg/2 mL | INTRAMUSCULAR | Status: DC | PRN
Start: 2018-06-04 — End: 2018-06-04

## 2018-06-04 MED ORDER — ONDANSETRON (PF) 4 MG/2 ML INJECTION
4 mg/2 mL | INTRAMUSCULAR | Status: DC | PRN
Start: 2018-06-04 — End: 2018-06-04
  Administered 2018-06-04: 14:00:00 via INTRAVENOUS

## 2018-06-04 MED ORDER — PROPOFOL 10 MG/ML IV EMUL
10 mg/mL | INTRAVENOUS | Status: DC | PRN
Start: 2018-06-04 — End: 2018-06-04
  Administered 2018-06-04: 14:00:00 via INTRAVENOUS

## 2018-06-04 MED ORDER — EPHEDRINE 50 MG/5 ML (10 MG/ML) IN NS IV SYRINGE
50 mg/5 mL (10 mg/mL) | INTRAVENOUS | Status: AC
Start: 2018-06-04 — End: ?

## 2018-06-04 MED ORDER — ADV ADDAPTOR
1.25 gram | Freq: Two times a day (BID) | Status: AC
Start: 2018-06-04 — End: 2018-06-06
  Administered 2018-06-05 – 2018-06-07 (×4): via INTRAVENOUS

## 2018-06-04 MED ORDER — FENTANYL CITRATE (PF) 50 MCG/ML IJ SOLN
50 mcg/mL | INTRAMUSCULAR | Status: AC
Start: 2018-06-04 — End: ?

## 2018-06-04 MED ORDER — GADOTERATE MEGLUMINE 0.5 MMOL/ML IV SOLUTION
0.5 mmol/mL (376.9 mg/mL) | INTRAVENOUS | Status: AC
Start: 2018-06-04 — End: 2018-06-04
  Administered 2018-06-04: 02:00:00

## 2018-06-04 MED ORDER — LIDOCAINE (PF) 10 MG/ML (1 %) IJ SOLN
10 mg/mL (1 %) | INTRAMUSCULAR | Status: DC | PRN
Start: 2018-06-04 — End: 2018-06-04

## 2018-06-04 MED FILL — HEPARIN (PORCINE) 5,000 UNIT/ML IJ SOLN: 5000 unit/mL | INTRAMUSCULAR | Qty: 1

## 2018-06-04 MED FILL — PREGABALIN 75 MG CAP: 75 mg | ORAL | Qty: 4

## 2018-06-04 MED FILL — MIDAZOLAM 1 MG/ML IJ SOLN: 1 mg/mL | INTRAMUSCULAR | Qty: 2

## 2018-06-04 MED FILL — DOTAREM 0.5 MMOL/ML (376.9 MG/ML) INTRAVENOUS SOLUTION: 0.5 mmol/mL (376.9 mg/mL) | INTRAVENOUS | Qty: 20

## 2018-06-04 MED FILL — PIPERACILLIN-TAZOBACTAM 3.375 GRAM IV SOLR: 3.375 gram | INTRAVENOUS | Qty: 3.38

## 2018-06-04 MED FILL — SENSORCAINE-MPF 0.5 % (5 MG/ML) INJECTION SOLUTION: 0.5 % (5 mg/mL) | INTRAMUSCULAR | Qty: 30

## 2018-06-04 MED FILL — ONDANSETRON (PF) 4 MG/2 ML INJECTION: 4 mg/2 mL | INTRAMUSCULAR | Qty: 2

## 2018-06-04 MED FILL — VANCOMYCIN 10 GRAM IV SOLR: 10 gram | INTRAVENOUS | Qty: 2000

## 2018-06-04 MED FILL — LACTATED RINGERS IV: INTRAVENOUS | Qty: 1000

## 2018-06-04 MED FILL — TOTAL B/C TABLET: ORAL | Qty: 1

## 2018-06-04 MED FILL — DIPRIVAN 10 MG/ML INTRAVENOUS EMULSION: 10 mg/mL | INTRAVENOUS | Qty: 20

## 2018-06-04 MED FILL — GLYCOPYRROLATE 0.2 MG/ML IJ SOLN: 0.2 mg/mL | INTRAMUSCULAR | Qty: 1

## 2018-06-04 MED FILL — LIDOCAINE (PF) 20 MG/ML (2 %) IJ SOLN: 20 mg/mL (2 %) | INTRAMUSCULAR | Qty: 5

## 2018-06-04 MED FILL — CYANOCOBALAMIN 500 MCG TAB: 500 mcg | ORAL | Qty: 1

## 2018-06-04 MED FILL — EPHEDRINE 50 MG/5 ML (10 MG/ML) IN NS IV SYRINGE: 50 mg/5 mL (10 mg/mL) | INTRAVENOUS | Qty: 5

## 2018-06-04 MED FILL — DULOXETINE 30 MG CAP, DELAYED RELEASE: 30 mg | ORAL | Qty: 2

## 2018-06-04 MED FILL — FENTANYL CITRATE (PF) 50 MCG/ML IJ SOLN: 50 mcg/mL | INTRAMUSCULAR | Qty: 5

## 2018-06-04 MED FILL — PROPOFOL 10 MG/ML IV EMUL: 10 mg/mL | INTRAVENOUS | Qty: 50

## 2018-06-04 MED FILL — ATORVASTATIN 20 MG TAB: 20 mg | ORAL | Qty: 4

## 2018-06-04 NOTE — Progress Notes (Signed)
Bedside shift change report given to Janine (oncoming nurse) by Demetria (offgoing nurse). Report included the following information SBAR, Kardex, MAR and Recent Results.

## 2018-06-04 NOTE — Progress Notes (Signed)
[place] [address]   Office [phone]  Fax [phone]          Assessment and Plan     Elijah [name] is a 68 y.o. male with a medical hx of polyneuropathy, OSAS (CPAP), HTN HLD, CAD with CABG x5 in 2011, Obesity and s/p Gastric Sleeve in 01/2018??admitted for R foot wound. He has spent 2 night(s) in the hospital    24 Hour Events: No acute events.    Rt foot wound- h/o osteo involving Rt foot, continues to follow with podiatry at [facility]. x2 weeks of painful sore on Rt foot. ??No abx given outpatient. ??POA elevated CRP to 5.4 and ESR elevated to 58.??LA wnl, XR- no fx and no osteo.??BLE Doppler negative for DVT. S/p debridement of wound with Podiatry. Wcx prelim moderate staph, GBS, and light diptheriods  - MIVF at 18m/hr , NPO  - Vanc and Zosyn for broad spectrum coverage   -f/u blood and final wound cx   -daily CBC/CMP/mag/phos??  -MRI prelim, suggestive of osteomyelitis of 5th me on L foot, f/u MRI R foot  -podiatry res, continue IV abx, f/u MRI for possible surgical intervention  -c/s wound care- appreciate recs     ??  Hypotension in the setting of HTN: BP in PCP office today 107/66 and Recent 70 Lb weight loss from gastric bypass. POA BP ??118/66. ??Iron profile, TSH and B12 from 3/2 wnl. Orthostatics negative Echo nl cavity size. LVEF 55-60%  -hold home Bystolic '10mg'$  daily   -daily CBC/CMP/mag/phos??  -Will continue to monitor at this time and readjust as BP's trend.  ??  S/p Gastric Sleeve: 70 lb wt loss since 11/19   -continue home B complex   -continue home B12??  ??  Hyperlipidemia:??Last lipid panel on??1/20??and values were TC- 118, TG- 119, ??HDL-41, LDL-53,   -Continue home??Lipitor '40mg'$  daily   ??  Polyneuropathy: chronic bilateral below the knee   -continue home Lyrica '300mg'$  bid   -continue home Cymbalta '60mg'$  once daily??  ??  Sleep Apnea:   -continue CPAP qhs??  ??  Obesity??BMI Body mass index is 38.1 kg/m??.   - Encouraging lifestyle modifications and further follow up outpatient.       FEN/GI - NPO  Activity - Ambulate with assistance  DVT prophylaxis - Sub Q Heparin  GI prophylaxis - Not indicated at this time  Disposition - Plan to d/c to TBD.  Code Status - Full    I appreciate the opportunity to participate in the care of this patient,  PMichel Santee DO  Family Medicine Resident         Subjective / Objective     Subjective: Patient is sleeping comfortably    Temp (24hrs), Avg:98.9 ??F (37.2 ??C), Min:98.1 ??F (36.7 ??C), Max:100.3 ??F (37.9 ??C)     Physical Exam  Constitutional:       General: He is not in acute distress.     Appearance: He is not ill-appearing.   Cardiovascular:      Rate and Rhythm: Normal rate and regular rhythm.      Heart sounds: No murmur.   Pulmonary:      Effort: Pulmonary effort is normal.      Breath sounds: No wheezing.   Musculoskeletal:         General: No swelling.      Comments: Wounds are covered by dressing        Respiratory:   O2 Device: CPAP mask  I/O:  Date 06/03/18 0700 - 06/04/18 0659 06/04/18 0700 - 06/05/18 0659   Shift 0700-1859 1900-0659 24 Hour Total 0700-1859 1900-0659 24 Hour Total   INTAKE   Shift Total(mL/kg)         OUTPUT   Urine(mL/kg/hr)  1060 1060        Urine Voided  1060 1060        Urine Occurrence(s)  1 x 1 x      Shift Total(mL/kg)  1060(8.1) 1060(8.1)      NET  -1060 -1060      Weight (kg) 131 131 131 131 131 131       Inpatient Medications  Current Facility-Administered Medications   Medication Dose Route Frequency   ??? vancomycin (VANCOCIN) 2,000 mg in 0.9% sodium chloride 500 mL IVPB  2,000 mg IntraVENous Q12H   ??? Vancomycin- Level at 1030am on 06/04/18   Other ONCE   ??? heparin (porcine) injection 5,000 Units  5,000 Units SubCUTAneous Q8H   ??? gadoterate meglumine (DOTAREM) 0.5 mmol/mL (376.9 mg/mL) contrast solution       ??? sodium chloride (NS) flush 5-40 mL  5-40 mL IntraVENous Q8H   ??? sodium chloride (NS) flush 5-40 mL  5-40 mL IntraVENous PRN    ??? 0.9% sodium chloride infusion  150 mL/hr IntraVENous CONTINUOUS   ??? acetaminophen (TYLENOL) tablet 650 mg  650 mg Oral Q4H PRN   ??? piperacillin-tazobactam (ZOSYN) 3.375 g in 0.9% sodium chloride (MBP/ADV) 100 mL  3.375 g IntraVENous Q8H   ??? atorvastatin (LIPITOR) tablet 80 mg  80 mg Oral DAILY   ??? b-complex with vitamin c tablet 1 Tab  1 Tab Oral DAILY   ??? cyanocobalamin (VITAMIN B12) tablet 500 mcg  500 mcg Oral DAILY   ??? DULoxetine (CYMBALTA) capsule 60 mg  60 mg Oral DAILY   ??? pregabalin (LYRICA) capsule 300 mg  300 mg Oral BID         Allergies  Allergies   Allergen Reactions   ??? Metoprolol Other (comments)     Patient states, made me feel horrible.         CBC:  Recent Labs     06/04/18  0054 06/03/18  0543 06-15-2018  1739   WBC 5.6 4.3 5.9   HGB 10.4* 10.7* 11.1*   HCT 31.7* 32.5* 33.6*   PLT 166 150 628       Metabolic Panel:  Recent Labs     06/04/18  0054 06/03/18  0543 06-15-18  1739   NA 142 144 139   K 3.6 3.6 3.8   CL 114* 115* 109*   CO2 _0 BUN _1 CREA 0.96 0.88 1.10   GLU 118* 73 113*   CA 7.8* 8.0* 8.7   MG 1.9 2.3  --    PHOS 2.4* 3.9  --    ALB  --   --  3.1*   SGOT  --   --  23   ALT  --   --  23              For Billing    Chief Complaint   Patient presents with   ??? Fatigue   ??? Foot Problem       Hospital Problems  Date Reviewed: 06/15/2018          Codes Class Noted POA    Cellulitis ICD-10-CM: L03.90  ICD-9-CM: 682.9  2018/06/15 Unknown        * (  Principal) Right foot infection ICD-10-CM: L08.9  ICD-9-CM: 686.9  06/02/2018 Unknown        Polyneuropathy (Chronic) ICD-10-CM: G62.9  ICD-9-CM: 356.9  05/28/2016 Yes        Obesity ICD-10-CM: E66.9  ICD-9-CM: 278.00  04/24/2015 Unknown        Essential hypertension, benign (Chronic) ICD-10-CM: I10  ICD-9-CM: 401.1  02/21/2015 Yes    Overview Signed 08/10/2016  3:46 PM by Margarette Canada, LPN     Overview:   Qualifier: Diagnosis of   By: Rogue Bussing CMA, Jacqualynn              HLD (hyperlipidemia) (Chronic) ICD-10-CM: E78.5   ICD-9-CM: 272.4  02/21/2015 Yes        Coronary atherosclerosis of native coronary artery (Chronic) ICD-10-CM: I25.10  ICD-9-CM: 414.01  02/21/2015 Yes    Overview Addendum 08/10/2016  3:46 PM by Margarette Canada, LPN     S/p CABG x5. DOE was presenting complaint.             OSA on CPAP (Chronic) ICD-10-CM: K82.06, Z99.89  ICD-9-CM: 327.23, V46.8  02/21/2015 Yes        S/P CABG (coronary artery bypass graft) ICD-10-CM: Z95.1  ICD-9-CM: V45.81  08/23/2013 Yes    Overview Signed 08/10/2016  3:46 PM by Margarette Canada, LPN     Overview:   0156

## 2018-06-04 NOTE — Anesthesia Post-Procedure Evaluation (Signed)
Procedure(s):  LEFT FOOT 5th METATARSAL RESECTION AND RIGHT FOOT I&D.    MAC    Anesthesia Post Evaluation      Multimodal analgesia: multimodal analgesia not used between 6 hours prior to anesthesia start to PACU discharge  Patient location during evaluation: PACU  Patient participation: complete - patient participated  Level of consciousness: awake and alert, responsive to verbal stimuli and sleepy but conscious  Pain score: 0  Pain management: adequate  Airway patency: patent  Anesthetic complications: no  Cardiovascular status: hemodynamically stable and acceptable  Respiratory status: acceptable  Hydration status: acceptable  Comments: Patient seen and evaluated; no concerns.  Post anesthesia nausea and vomiting:  none      Vitals Value Taken Time   BP 130/63 06/04/2018 11:10 AM   Temp 36.9 ??C (98.4 ??F) 06/04/2018 10:54 AM   Pulse 56 06/04/2018 11:13 AM   Resp 16 06/04/2018 11:13 AM   SpO2 99 % 06/04/2018 11:13 AM   Vitals shown include unvalidated device data.

## 2018-06-04 NOTE — Progress Notes (Signed)
Occupational Therapy    Orders received and chart reviewed. Patient off floor for biopsy of L 5th metatarsal and toe plus I&D of R foot. Will follow up tomorrow.    Thank you,    Becky Powers, MOTR/L

## 2018-06-04 NOTE — Anesthesia Pre-Procedure Evaluation (Addendum)
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

## 2018-06-04 NOTE — Progress Notes (Signed)
MRI LEFT FOOT  *PRELIMINARY REPORT*??  Imaging findings suggestive of osteomyelitis of the fifth metatarsal head  ??  Preliminary report was provided by Dr. Magnus Ivan, the on-call radiologist, at 12:19  AM  ??  Final report to follow.  ??  *END PRELIMINARY REPORT*      MRI RIGHT FOOT  *PRELIMINARY REPORT*  ??  Enhancement of the plantar surface of the medial proximal cuboid, suspicious for  osteomyelitis.  ??  Preliminary report was provided by Dr. Caren Griffins, the on-call radiologist, at  8:12 AM on 06/04/2018  ??  Final report to follow.  ??  *END PRELIMINARY REPORT*  ??  Positive Testing:   The patient has had an MRI performed of bilateral feet which indicates possible osteomyelitis of the left 5th metatarsal and pathology to the right foot. This correlates with the patients signs and symptoms. As a result I have told the patient that in my opinion the best treatment would be surgical intervention which will involve a biopsy of the left 5th metatarsal and toe and incision and drainage of the right foot.     Informed Consent:   I have discussed the procedure as detailed above including the proposed benefits, risks, complications and alternatives with the patient. I have explained the likely outcome, recovery and results. All questions have been thoroughly answered and the patient expressed understanding and satisfaction with the knowledge. No guarantees have been provided or implied in regards to results. The particular risks of surgery, including but not limited to blood clots, infection and anesthesia risks and likely need for additional surgery have been discussed and explained. At the conclusion of this discussion the patient has made an informed decision to proceed with the intended treatment plan.    Surgery Proceed: After a discussion about the surgery the patient has decided to follow my recommendation and proceed with surgery.    Again, I have discussed the risk and benefits of this surgery/procedure  and the alternatives with the patient. All questions answered to his satisfaction. Will proceed with surgery as discussed.

## 2018-06-04 NOTE — Progress Notes (Signed)
[facility]  Resident Note in Brief  ----------------------------------------    S: Patient seen and examined post I&D of R foot AND incision of L 5th digit. Feeling well without complaint.      O:  Visit Vitals  BP 143/78 (BP 1 Location: Right arm, BP Patient Position: At rest)   Pulse (!) 52   Temp 97.7 ??F (36.5 ??C)   Resp 17   Ht 6' 1" (1.854 m)   Wt 288 lb 12.8 oz (131 kg)   SpO2 98%   BMI 38.10 kg/m??     Physical Exam  Constitutional:       General: He is not in acute distress.  HENT:      Head: Normocephalic and atraumatic.   Cardiovascular:      Rate and Rhythm: Normal rate and regular rhythm.      Heart sounds: No murmur.   Pulmonary:      Effort: Pulmonary effort is normal.      Breath sounds: No wheezing.   Musculoskeletal:      Comments: Both feet are wrapped in ACE bandage         A/P    68 y/o M wih a medical history of HTN, HLD, and neuropathy presenting with bilateral foot osteomyelitis. He is s/p I&D of R foot and incision of L 5th digit and metatarsal.  -Continue IV abx  -Follow up on bcx and wcx  -Will follow up on Podiatry recs    Please see full daily progress note for complete plan.  Jalynn Waddell Jean-Charles, DO  1:39 PM

## 2018-06-04 NOTE — Brief Op Note (Signed)
BRIEF OPERATIVE NOTE    DICTATION # [id number]  Date of Procedure: 06/04/2018   Preoperative Diagnosis:   Patient Active Problem List   Diagnosis Code   ??? Cellulitis L03.90   ??? Right foot infection L08.9   ??? Abscess of right foot L02.611   ??? Acute osteomyelitis of left foot (HCC) M86.172       Postoperative Diagnosis:   Patient Active Problem List   Diagnosis Code   ??? Cellulitis L03.90   ??? Right foot infection L08.9   ??? Abscess of right foot L02.611   ??? Acute osteomyelitis of left foot (HCC) M86.172       Procedure(s):  1. Incision and drainage of right foot  2. Incision of bone cortex left 5th digit proximal phalanx   3. Incision of bone cortex left fifth metatarsal, left fifth metatarsal    Surgeon(s) and Role:     * Jayln Madeira A, DPM - Primary         Surgical Assistant: Paul Graves, LPN    Surgical Staff:  Circ-1: Fenske, Edward L, RN  Scrub Tech-1: Williams, Chad R  Surg Asst-1: Graves, Paul D, LPN  Event Time In Time Out   Incision Start 06/04/2018 1008    Incision Close 06/04/2018 1044      Anesthesia: MAC  With local block  Estimated Blood Loss: 5cc  Specimens:   ID Type Source Tests Collected by Time Destination   1 : LEFT 5th toe bone Preservative Bone  Roshun Klingensmith A, DPM 06/04/2018 1017 Pathology   2 : left 5th metatarsal Preservative Bone  Santana Edell A, DPM 06/04/2018 1023 Pathology   1 : Left 5th metatarsal Bone Bone CULTURE, AEROBIC AND ANAEROBIC Yasir Kitner A, DPM 06/04/2018 1017 Microbiology   2 : Right foot abcsess Wound Foot, Right CULTURE, AEROBIC AND ANAEROBIC Heylee Tant A, DPM 06/04/2018 1042 Microbiology      Findings: no pus or purulence to the LEFT foot ulcer; RIGHT foot abscess  - no additional pus was encountered on compression at the end of the procedure   Complications: none  Implants: * No implants in log *

## 2018-06-04 NOTE — Progress Notes (Signed)
[facility] Dosing Services: Antimicrobial Stewardship Progress Note    Consult for antibiotic dosing of Vancomycin by Dr. Womitso  Pharmacist reviewed antibiotic appropriateness for 67 year old , male  for indication of Cellulitis of the right foot  Day of Therapy 2    Plan:  Vancomycin therapy:  MD: 2000mg q 12 hrs  Dose calculated to approximate a therapeutic trough of 10-15mcg/mL.      Last trough level   Date Dose & Interval Measured (mcg/mL) Extrapolated (mcg/mL)   ?3/8 ?2000mg q 12 hrs ?18.8 (13 hrs post-dose) ?19.8   ? ? ? ?   ? ? ? ?       Plan for level:   Scr stable with slight increase.  I &D today.  WBC WNL, afebrile.  Decreased to 1250mg q 12 hrs and delayed next does by 9 hr to allow for further elimination.  Will check trough prior to 4th dose of new regimen.    Pharmacy to follow daily and will make changes to dose and/or frequency based on clinical status.      Other Antimicrobial  (not dosed by pharmacist)   Zosyn 3.375gm IV every 8hrs   Cultures     3/8: Wound- pending  Anaerobe- pending  3/8: Tissue- pending  Anaerobic- pending  3/7: tissue GBS, Staph aureus- pending  3/7: Foot, wound- GBS, Staph aureus- pending  3/6: Blood- pending   Serum Creatinine     Lab Results   Component Value Date/Time    Creatinine 0.96 06/04/2018 12:54 AM       Creatinine Clearance Estimated Creatinine Clearance: 103 mL/min (based on SCr of 0.96 mg/dL).     Procalcitonin  No results found for: PCT     Temp   97.7 ??F (36.5 ??C)    WBC   Lab Results   Component Value Date/Time    WBC 5.6 06/04/2018 12:54 AM       H/H   Lab Results   Component Value Date/Time    HGB 10.4 (L) 06/04/2018 12:54 AM      Platelets Lab Results   Component Value Date/Time    PLATELET 166 06/04/2018 12:54 AM            Pharmacist: Signed Nurah Petrides A. Lisamarie Coke, PHARMD

## 2018-06-04 NOTE — Progress Notes (Addendum)
PT Note:    15:13pm New consult appreciated.  Noted order for "WBAT in post op shoes."  Will follow up tomorrow for PT evaluation, POD 1.    9:07am Orders received and acknowledged.  Chart reviewed.  Patient OTF for biopsy of L 5th metatarsal and toe plus I&D of R foot.  Will follow up tomorrow for PT evaluation.  Thank you.

## 2018-06-04 NOTE — Op Note (Signed)
[facility]  OPERATIVE REPORT    Name:  Elijah [name], Elijah [name]  MR#:  [id number]  DOB:  05-Feb-1951  ACCOUNT #:  [id number]  DATE OF SERVICE:  06/04/2018    PREOPERATIVE DIAGNOSES:  1.  Cellulitis, right foot.  2.  Right foot infection.  3.  Abscess, right foot.  4.  Acute osteomyelitis, left foot.    POSTOPERATIVE DIAGNOSES:  1.  Cellulitis, right foot.  2.  Right foot infection.  3.  Abscess, right foot.  4.  Acute osteomyelitis, left foot.    PROCEDURE PERFORMED:  1.  Excision and drainage, right foot, multiple fascial spaces.  2.  Incision of bone cortex, left fifth digit proximal phalanx.  3.  Incision of bone cortex, left fifth metatarsal.    SURGEON:  Manley Mason, DPM    ASSISTANT:  Heriberto Antigua, LPN    ANESTHESIA:  MAC with IV sedation and local anesthesia.    COMPLICATIONS:  None.    SPECIMENS REMOVED:  1.  Left fifth toe proximal phalanx bone for pathology.  2.  Left fifth metatarsal bone for pathology and culture.  3.  Right foot abscess swab for culture.    IMPLANTS:  None.    ESTIMATED BLOOD LOSS:  5 mL    INDICATIONS/VERIFICATION:  This is a 68 year old male who presented to [facility] ED due to feeling lethargic and not well overall.  He was found to have right foot cellulitis that looked associated with plantar midfoot wound.  The patient also has a history of left fifth submetatarsal ulceration for the last few months, which was being treated by Dr. Ria Comment at [facility].  I was consulted for further evaluation and management.  MRIs were ordered, which did show some purulent pus, area of abscess to the right midfoot, plus osteomyelitis of the left fifth metatarsal.  In depth discussion was held with the patient regarding these findings.  Conservative and surgical options were discussed which include but are not limited to incision and drainage of the right foot and to the left foot, do nothing, wound care, observation, partial foot ray  amputation, bone biopsies, long-term IV antibiotics.  The patient relayed understanding to all these options and wished to have a biopsy performed to the left foot fifth metatarsal and proximal phalanx base prior to any amputation and further surgical decision making.    The patient understands the possibility of worsening infections.  He relayed understanding once again.  Risks, benefits, alternatives, and possible complications were discussed in detail with the patient.  No guarantees were given or implied.  Consents were signed and placed in the chart.    PROCEDURE:    The patient was identified in the preoperative holding area.  Lower extremities marked bilaterally.  The patient was then brought back to the operating room and was kept on the inpatient hospital room bed, and bilateral lower extremities were then scrubbed, prepped, and draped in the usual aseptic manner.  Attention was first directed to the left foot.  Incision was made over the proximal phalanx which was then deepened down to bone.  A combination of Jamshidi needle and rongeur and hemostat were utilized, and the specimen from the fifth toe proximal phalanx was then sent to pathology.  Attention was then directed to the fifth metatarsal where another incision was made and was deepened down to bone once again using blunt dissection.  A new Jamshidi needle was utilized along with a rongeur and hemostat.  Specimen was obtained from the fifth metatarsal bone and sent for pathology and cultures.  Surgical site was irrigated with copious amount of sterile normal saline solution.  The plantar foot fifth metatarsal ulceration was debrided as well and no purulent drainage noticed at this time.  The incisions were then closed using 4-0 nylon.  Dressings were applied consisting of Adaptic soaked in Betadine, dry sterile dressing, Kerlix, Coban for leg compression, and Ace wrap.      Attention was then directed to the right foot at the midfoot plantar wound  over the medial band plantar fascia.  Incision was made both proximally and distally, and the wound was excised. Blunt dissection was then utilized, and there was noted to be abscess deep to the plantar fascia.  Culture swabs were obtained and sent to Pathology.  Surgical site was explored further.  There were multiple fascial planes both proximally, distally, medially, and laterally, and no additional pus or purulence was encountered.  Surgical site was then irrigated with copious amounts of sterile normal saline solution.  The incision was then packed with Iodoform packing strips, and 2-0 nylon suture was utilized to reapproximate the incision for retention of the pack, ensuring that the packing would not extrude into the wound.  Dressing was then applied consisting of Adaptic soaked in Betadine, gauze, Kerlix, Coban for leg compression, and an Ace wrap.  The patient tolerated the procedure and anesthesia well and was transferred back to the recovery room with vital signs stable and neurovascular status intact to all the toes of the bilateral feet.  The patient will then be discharged back to his hospital room bed once cleared by Anesthesia while we await pathology results.      Lorraine Lax, DPM      [id number]  D:  06/04/2018 11:37  T:  06/04/2018 21:58  JOB #:  [id number]

## 2018-06-04 NOTE — Progress Notes (Signed)
Bedside and Verbal shift change report given to Demetria,RN (oncoming nurse) by Ufei,RN (offgoing nurse). Report included the following information SBAR, Kardex, Procedure Summary, Intake/Output, MAR, Recent Results and Med Rec Status.

## 2018-06-04 NOTE — Other (Signed)
TRANSFER - OUT REPORT:    Verbal report given to [facility] RN on Elijah [name]  being transferred to 501 for routine post - op       Report consisted of patient???s Situation, Background, Assessment and   Recommendations(SBAR).     Information from the following report(s) SBAR, OR Summary, Procedure Summary and Intake/Output was reviewed with the receiving nurse.    Lines:   Peripheral IV 06/02/18 Right Antecubital (Active)   Site Assessment Clean, dry, & intact 06/04/2018 11:00 AM   Phlebitis Assessment 0 06/04/2018 11:00 AM   Infiltration Assessment 0 06/04/2018 11:00 AM   Dressing Status Clean, dry, & intact 06/04/2018 11:00 AM   Dressing Type Tape;Transparent 06/04/2018 11:00 AM   Hub Color/Line Status Pink;Capped 06/04/2018 11:00 AM   Action Taken Other (comment) 06/04/2018 11:00 AM   Alcohol Cap Used Yes 06/04/2018 11:00 AM       Peripheral IV 06/02/18 Distal;Left Antecubital (Active)   Site Assessment Clean, dry, & intact 06/04/2018 11:00 AM   Phlebitis Assessment 0 06/04/2018 11:00 AM   Infiltration Assessment 0 06/04/2018 11:00 AM   Dressing Status Clean, dry, & intact 06/04/2018 11:00 AM   Dressing Type Tape;Transparent 06/04/2018 11:00 AM   Hub Color/Line Status Pink;Infusing 06/04/2018 11:00 AM   Action Taken Open ports on tubing capped 06/04/2018  9:23 AM   Alcohol Cap Used Yes 06/04/2018 11:00 AM       Peripheral IV 06/04/18 Left;Posterior Hand (Active)   Site Assessment Clean, dry, & intact 06/04/2018 11:00 AM   Phlebitis Assessment 0 06/04/2018 11:00 AM   Infiltration Assessment 0 06/04/2018 11:00 AM   Dressing Status Clean, dry, & intact 06/04/2018 11:00 AM   Dressing Type Tape;Transparent 06/04/2018 11:00 AM   Hub Color/Line Status Pink;Infusing 06/04/2018 11:00 AM   Action Taken Open ports on tubing capped 06/04/2018  9:23 AM   Alcohol Cap Used Yes 06/04/2018 11:00 AM        Opportunity for questions and clarification was provided.      Patient transported with:   Monitor  Registered Nurse

## 2018-06-04 NOTE — Progress Notes (Signed)
MRI LEFT FOOT  *PRELIMINARY REPORT*??  Imaging findings suggestive of osteomyelitis of the fifth metatarsal head  ??  Preliminary report was provided by Dr. Magnus Ivan, the on-call radiologist, at 12:19  AM  ??  Final report to follow.  ??  *END PRELIMINARY REPORT*      MRI RIGHT FOOT  *PRELIMINARY REPORT*  ??  Enhancement of the plantar surface of the medial proximal cuboid, suspicious for  osteomyelitis.  ??  Preliminary report was provided by Dr. Caren Griffins, the on-call radiologist, at  8:12 AM on 06/04/2018  ??  Final report to follow.  ??  *END PRELIMINARY REPORT*  ??  Positive Testing:   The patient has had an MRI performed of bilateral feet which indicates possible osteomyelitis of the left 5th metatarsal and pathology to the right foot. This correlates with the patients signs and symptoms. As a result I have told the patient that in my opinion the best treatment would be surgical intervention which will involve a biopsy of the left 5th metatarsal and toe and incision and drainage of the right foot.     Informed Consent:   I have discussed the procedure as detailed above including the proposed benefits, risks, complications and alternatives with the patient. I have explained the likely outcome, recovery and results. All questions have been thoroughly answered and the patient expressed understanding and satisfaction with the knowledge. No guarantees have been provided or implied in regards to results. The particular risks of surgery, including but not limited to blood clots, infection and anesthesia risks and likely need for additional surgery have been discussed and explained. At the conclusion of this discussion the patient has made an informed decision to proceed with the intended treatment plan.    Surgery Proceed: After a discussion about the surgery the patient has decided to follow my recommendation and proceed with surgery.    Again, I have discussed the risk and benefits of this surgery/procedure and the alternatives  with the patient. All questions answered to his satisfaction. Will proceed with surgery as discussed.

## 2018-06-04 NOTE — Op Note (Signed)
BRIEF OPERATIVE NOTE    DICTATION # [id number]  Date of Procedure: 06/04/2018   Preoperative Diagnosis:   Patient Active Problem List   Diagnosis Code   ??? Cellulitis L03.90   ??? Right foot infection L08.9   ??? Abscess of right foot L02.611   ??? Acute osteomyelitis of left foot (HCC) M86.172       Postoperative Diagnosis:   Patient Active Problem List   Diagnosis Code   ??? Cellulitis L03.90   ??? Right foot infection L08.9   ??? Abscess of right foot L02.611   ??? Acute osteomyelitis of left foot ([place]) M86.172       Procedure(s):  1. Incision and drainage of right foot  2. Incision of bone cortex left 5th digit proximal phalanx   3. Incision of bone cortex left fifth metatarsal, left fifth metatarsal    Surgeon(s) and Role:     * Lorraine Lax, DPM - Primary         Surgical Assistant: Keitha Butte, LPN    Surgical Staff:  Circ-1: Crista Luria, RN  Scrub Tech-1: Williams, [place] R  Surg Asst-1: Marolyn Hammock, LPN  Event Time In Time Out   Incision Start 06/04/2018 1008    Incision Close 06/04/2018 1044      Anesthesia: MAC  With local block  Estimated Blood Loss: 5cc  Specimens:   ID Type Source Tests Collected by Time Destination   1 : LEFT 5th toe bone Preservative Bone  Lorraine Lax, DPM 06/04/2018 1017 Pathology   2 : left 5th metatarsal Preservative Bone  Lorraine Lax, DPM 06/04/2018 1023 Pathology   1 : Left 5th metatarsal Bone Bone CULTURE, AEROBIC AND ANAEROBIC Lorraine Lax, DPM 06/04/2018 1017 Microbiology   2 : Right foot abcsess Wound Foot, Right CULTURE, AEROBIC AND ANAEROBIC Lorraine Lax, DPM 06/04/2018 1042 Microbiology      Findings: no pus or purulence to the LEFT foot ulcer; RIGHT foot abscess  - no additional pus was encountered on compression at the end of the procedure   Complications: none  Implants: * No implants in log *

## 2018-06-04 NOTE — Progress Notes (Signed)
 PT Note:    15:13pm New consult appreciated.  Noted order for WBAT in post op shoes.  Will follow up tomorrow for PT evaluation, POD 1.    9:07am Orders received and acknowledged.  Chart reviewed.  Patient OTF for biopsy of L 5th metatarsal and toe plus I&D of R foot.  Will follow up tomorrow for PT evaluation.  Thank you.

## 2018-06-04 NOTE — Progress Notes (Signed)
Occupational Therapy    Orders received and chart reviewed. Patient off floor for biopsy of L 5th metatarsal and toe plus I&D of R foot. Will follow up tomorrow.    Thank you,    Belinda Block, MOTR/L

## 2018-06-04 NOTE — Anesthesia Pre-Procedure Evaluation (Signed)
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

## 2018-06-04 NOTE — Anesthesia Post-Procedure Evaluation (Signed)
Procedure(s):  LEFT FOOT 5th METATARSAL RESECTION AND RIGHT FOOT I&D.    MAC    Anesthesia Post Evaluation      Multimodal analgesia: multimodal analgesia not used between 6 hours prior to anesthesia start to PACU discharge  Patient location during evaluation: PACU  Patient participation: complete - patient participated  Level of consciousness: awake and alert, responsive to verbal stimuli and sleepy but conscious  Pain score: 0  Pain management: adequate  Airway patency: patent  Anesthetic complications: no  Cardiovascular status: hemodynamically stable and acceptable  Respiratory status: acceptable  Hydration status: acceptable  Comments: Patient seen and evaluated; no concerns.  Post anesthesia nausea and vomiting:  none      Vitals Value Taken Time   BP 130/63 06/04/2018 11:10 AM   Temp 36.9 ??C (98.4 ??F) 06/04/2018 10:54 AM   Pulse 56 06/04/2018 11:13 AM   Resp 16 06/04/2018 11:13 AM   SpO2 99 % 06/04/2018 11:13 AM   Vitals shown include unvalidated device data.

## 2018-06-04 NOTE — Progress Notes (Signed)
 [facility] Dosing Services: Antimicrobial Stewardship Progress Note    Consult for antibiotic dosing of Vancomycin by Dr. Tonye  Pharmacist reviewed antibiotic appropriateness for 68 year old , male  for indication of Cellulitis of the right foot  Day of Therapy 2    Plan:  Vancomycin therapy:  MD: 2000mg  q 12 hrs  Dose calculated to approximate a therapeutic trough of 10-65mcg/mL.      Last trough level   Date Dose & Interval Measured (mcg/mL) Extrapolated (mcg/mL)   ?3/8 ?2000mg  q 12 hrs ?18.8 (13 hrs post-dose) ?19.8   ? ? ? ?   ? ? ? ?       Plan for level:   Scr stable with slight increase.  I &D today.  WBC WNL, afebrile.  Decreased to 1250mg  q 12 hrs and delayed next does by 9 hr to allow for further elimination.  Will check trough prior to 4th dose of new regimen.    Pharmacy to follow daily and will make changes to dose and/or frequency based on clinical status.      Other Antimicrobial  (not dosed by pharmacist)   Zosyn 3.375gm IV every 8hrs   Cultures     3/8: Wound- pending  Anaerobe- pending  3/8: Tissue- pending  Anaerobic- pending  3/7: tissue GBS, Staph aureus- pending  3/7: Foot, wound- GBS, Staph aureus- pending  3/6: Blood- pending   Serum Creatinine     Lab Results   Component Value Date/Time    Creatinine 0.96 06/04/2018 12:54 AM       Creatinine Clearance Estimated Creatinine Clearance: 103 mL/min (based on SCr of 0.96 mg/dL).     Procalcitonin  No results found for: PCT     Temp   97.7 F (36.5 C)    WBC   Lab Results   Component Value Date/Time    WBC 5.6 06/04/2018 12:54 AM       H/H   Lab Results   Component Value Date/Time    HGB 10.4 (L) 06/04/2018 12:54 AM      Platelets Lab Results   Component Value Date/Time    PLATELET 166 06/04/2018 12:54 AM            Pharmacist: Signed Selinda A. Kirby, PHARMD

## 2018-06-04 NOTE — Progress Notes (Signed)
[facility]  Resident Note in Brief  ----------------------------------------    S: Patient seen and examined post I&D of R foot AND incision of L 5th digit. Feeling well without complaint.      O:  Visit Vitals  BP 143/78 (BP 1 Location: Right arm, BP Patient Position: At rest)   Pulse (!) 52   Temp 97.7 ??F (36.5 ??C)   Resp 17   Ht 6\' 1"  (1.854 m)   Wt 288 lb 12.8 oz (131 kg)   SpO2 98%   BMI 38.10 kg/m??     Physical Exam  Constitutional:       General: He is not in acute distress.  HENT:      Head: Normocephalic and atraumatic.   Cardiovascular:      Rate and Rhythm: Normal rate and regular rhythm.      Heart sounds: No murmur.   Pulmonary:      Effort: Pulmonary effort is normal.      Breath sounds: No wheezing.   Musculoskeletal:      Comments: Both feet are wrapped in ACE bandage         A/P    68 y/o M wih a medical history of HTN, HLD, and neuropathy presenting with bilateral foot osteomyelitis. He is s/p I&D of R foot and incision of L 5th digit and metatarsal.  -Continue IV abx  -Follow up on bcx and wcx  -Will follow up on Podiatry recs    Please see full daily progress note for complete plan.  Dava Najjar, DO  1:39 PM

## 2018-06-04 NOTE — Progress Notes (Signed)
Progress Notes by Michel Santee, DO at 06/04/18 1610                Author: Michel Santee, DO  Service: FAMILY MEDICINE  Author Type: Resident       Filed: 06/04/18 0653  Date of Service: 06/04/18 0646  Status: Attested           Editor: Michel Santee, DO (Resident)  Cosigner: Oren Bracket, MD at 06/09/18 0205          Attestation signed by Oren Bracket, MD at 06/09/18 [facility] Attending Addendum:       I was NOT present with the resident during the interview & examination of the patient. I personally interviewed the patient & repeated the critical or key portions of the exam.  I agree with the resident's note with the following additions:       68 y.o. male who  has a past medical history of BPH (benign prostatic hyperplasia), CAD (coronary artery disease), History of vascular access device (09/08/2016), HLD (hyperlipidemia), HTN (hypertension), Morbid obesity due to excess calories ([place])  (04/24/2015), OSA (obstructive sleep apnea), Polyneuropathy, and Tubular adenoma of colon. admitted for RLE cellulitis and bilateral foot ulcers.       Pt alert and oriented x 3, pleasant, appropriate affect   No resp distress   Bilateral feet in dressing/ACE which are c/d/i       1.  Bilateral foot osteomyelitis: MRI shows concern for osteo of Left 5th MT and Right medial proximal cuboid. Podiatry consulted, apprec recs and care - plan for I&D of right  foot and bx of left 5th MT. Continue vancomycin and zosyn. WCx with GBS and staph, awaiting speciation/sensitivities. Following intra-op cultures and pathology to determine abx.       See resident note for detailed assessment and plan.       Oren Bracket, MD    Pt seen and examined on 06/04/2018                                           [place] [address]    Office [phone]   Fax [phone]                Assessment and Plan        Elijah [name]  is a 68 y.o. male with a medical hx of polyneuropathy, OSAS (CPAP), HTN HLD, CAD with CABG x5 in 2011, Obesity and s/p Gastric Sleeve in 01/2018??admitted for R foot wound. He  has spent 2 night(s) in the hospital      24 Hour Events: No acute events.      Rt foot wound- h/o osteo involving Rt foot, continues to follow with podiatry at [facility]. x2 weeks of  painful sore on Rt foot. ??No abx given outpatient. ??POA elevated CRP to 5.4 and ESR elevated to 58.??LA wnl, XR- no fx and no osteo.??BLE Doppler negative for DVT. S/p debridement of wound with Podiatry. Wcx prelim moderate staph, GBS, and light  diptheriods   - MIVF at 150m/hr , NPO   - Vanc and Zosyn for broad spectrum coverage    -f/u blood and final wound cx    -daily CBC/CMP/mag/phos??   -MRI prelim, suggestive of osteomyelitis of 5th  me on L foot, f/u MRI R foot   -podiatry res, continue IV abx, f/u MRI for possible surgical intervention   -c/s wound care- appreciate recs       ??   Hypotension in the setting of HTN: BP in PCP office today 107/66 and Recent 70 Lb weight loss from gastric bypass. POA BP ??118/66. ??Iron  profile, TSH and B12 from 3/2 wnl. Orthostatics negative Echo nl cavity size. LVEF 55-60%   -hold home Bystolic 29JJ daily    -daily CBC/CMP/mag/phos??   -Will continue to monitor at this time and readjust as BP's trend.   ??   S/p Gastric Sleeve: 70 lb wt loss since 11/19    -continue home B complex    -continue home B12??   ??   Hyperlipidemia:??Last lipid panel on??1/20??and values were TC- 118, TG- 119, ??HDL-41, LDL-53,    -Continue home??Lipitor 44m daily    ??   Polyneuropathy: chronic bilateral below the knee    -continue home Lyrica 3014mbid    -continue home Cymbalta 6014mnce daily??   ??   Sleep Apnea:    -continue CPAP qhs??   ??   Obesity??BMI Body mass index is 38.1 kg/m??.   - Encouraging lifestyle modifications and further follow up outpatient.          FEN/GI - NPO   Activity - Ambulate with assistance   DVT prophylaxis - Sub Q Heparin   GI  prophylaxis - Not indicated at this time   Disposition - Plan to d/c to TBD.   Code Status - Full      I appreciate the opportunity to participate in the care of this patient,   [name]   Family Medicine Resident              Subjective / Objective        Subjective: Patient is sleeping comfortably      Temp (24hrs), Avg:98.9 ??F (37.2 ??C), Min:98.1 ??F (36.7 ??C), Max:100.3 ??F (37.9 ??C)       Physical Exam   Constitutional :        General: He is not in acute distress.     Appearance: He is not ill-appearing.   Cardiovascular:       Rate and Rhythm: Normal rate and regular rhythm.      Heart sounds: No murmur.    Pulmonary:       Effort: Pulmonary effort is normal.      Breath sounds: No wheezing.   Musculoskeletal:          General: No swelling.      Comments: Wounds are covered by dressing           Respiratory:   O2 Device: CPAP mask       I/O:       Date  06/03/18 0700 - 06/04/18 0659  06/04/18 0700 - 06/05/18 0659             Shift  0700-1859  1900-0659  24 Hour Total  0700-1859  [id number] Hour Total       INTAKE             Shift Total(mL/kg)                   OUTPUT             Urine(mL/kg/hr)    1060  1060  Urine Voided    1060  1060                     Urine Occurrence(s)    1 x  1 x                   Shift Total(mL/kg)    1060(8.1)  1060(8.1)                   NET    -1060  -1060                   Weight (kg)  131  131  131  131  131  131           Inpatient Medications     Current Facility-Administered Medications          Medication  Dose  Route  Frequency           ?  vancomycin (VANCOCIN) 2,000 mg in 0.9% sodium chloride 500 mL IVPB   2,000 mg  IntraVENous  Q12H           ?  Vancomycin- Level at 1030am on 06/04/18     Other  ONCE     ?  heparin (porcine) injection 5,000 Units   5,000 Units  SubCUTAneous  Q8H     ?  gadoterate meglumine (DOTAREM) 0.5 mmol/mL (376.9 mg/mL) contrast solution            ?  sodium chloride (NS) flush 5-40 mL   5-40 mL  IntraVENous  Q8H      ?  sodium chloride (NS) flush 5-40 mL   5-40 mL  IntraVENous  PRN     ?  0.9% sodium chloride infusion   150 mL/hr  IntraVENous  CONTINUOUS     ?  acetaminophen (TYLENOL) tablet 650 mg   650 mg  Oral  Q4H PRN     ?  piperacillin-tazobactam (ZOSYN) 3.375 g in 0.9% sodium chloride (MBP/ADV) 100 mL   3.375 g  IntraVENous  Q8H     ?  atorvastatin (LIPITOR) tablet 80 mg   80 mg  Oral  DAILY     ?  b-complex with vitamin c tablet 1 Tab   1 Tab  Oral  DAILY     ?  cyanocobalamin (VITAMIN B12) tablet 500 mcg   500 mcg  Oral  DAILY     ?  DULoxetine (CYMBALTA) capsule 60 mg   60 mg  Oral  DAILY           ?  pregabalin (LYRICA) capsule 300 mg   300 mg  Oral  BID              Allergies     Allergies        Allergen  Reactions         ?  Metoprolol  Other (comments)             Patient states, made me feel horrible.              CBC:     Recent Labs             06/04/18   0054  06/03/18   0543  06/02/18   1739     WBC  5.6  4.3  5.9     HGB  10.4*  10.7*  11.1*     HCT  31.7*  32.5*  33.6*          PLT  166  150  186           Metabolic Panel:     Recent Labs             06/04/18   0054  06/03/18   0543  2018-06-07   1739     NA  142  144  139     K  3.6  3.6  3.8     CL  114*  115*  109*     CO2  '22  24  26     ' BUN  '8  13  15     ' CREA  0.96  0.88  1.10     GLU  118*  73  113*     CA  7.8*  8.0*  8.7     MG  1.9  2.3   --      PHOS  2.4*  3.9   --      ALB   --    --   3.1*     SGOT   --    --   23          ALT   --    --   23                         For Billing          Chief Complaint      Patient presents with      ?  Fatigue      ?  Foot Problem               Hospital Problems   Date Reviewed:  2018/06/07                 Codes  Class  Noted  POA        Cellulitis  ICD-10-CM: L03.90   ICD-9-CM: 682.9    07-Jun-2018  Unknown                * (Principal) Right foot infection  ICD-10-CM: L08.9   ICD-9-CM: 686.9    07-Jun-2018  Unknown                Polyneuropathy (Chronic)  ICD-10-CM: G62.9   ICD-9-CM: 356.9    05/28/2016  Yes                 Obesity  ICD-10-CM: E66.9   ICD-9-CM: 278.00    04/24/2015  Unknown                Essential hypertension, benign (Chronic)  ICD-10-CM: I10   ICD-9-CM: 401.1    02/21/2015  Yes        Overview Signed 08/10/2016  3:46 PM by Margarette Canada, LPN          Overview:    Qualifier: Diagnosis of    By: Rogue Bussing CMA, Jacqualynn                          HLD (hyperlipidemia) (Chronic)  ICD-10-CM: E78.5   ICD-9-CM: 272.4    02/21/2015  Yes                Coronary atherosclerosis of native coronary artery (Chronic)  ICD-10-CM: I25.10   ICD-9-CM: 414.01    02/21/2015  Yes  Overview Addendum 08/10/2016  3:46 PM by Margarette Canada, LPN          S/p CABG x5. DOE was presenting complaint.                         OSA on CPAP (Chronic)  ICD-10-CM: V61.60, Z99.89   ICD-9-CM: 327.23, V46.8    02/21/2015  Yes                S/P CABG (coronary artery bypass graft)  ICD-10-CM: Z95.1   ICD-9-CM: V45.81    08/23/2013  Yes        Overview Signed 08/10/2016  3:46 PM by Margarette Canada, LPN          Overview:    2009

## 2018-06-05 LAB — CULTURE, WOUND W GRAM STAIN
GRAM STAIN: NONE SEEN
Gram Stain Result: NONE SEEN

## 2018-06-05 LAB — CBC
Hematocrit: 30.7 % — ABNORMAL LOW (ref 36.6–50.3)
Hemoglobin: 9.9 g/dL — ABNORMAL LOW (ref 12.1–17.0)
MCH: 31.2 PG (ref 26.0–34.0)
MCHC: 32.2 g/dL (ref 30.0–36.5)
MCV: 96.8 FL (ref 80.0–99.0)
MPV: 11.6 FL (ref 8.9–12.9)
NRBC Absolute: 0 10*3/uL (ref 0.00–0.01)
Nucleated RBCs: 0 PER 100 WBC
Platelets: 171 10*3/uL (ref 150–400)
RBC: 3.17 M/uL — ABNORMAL LOW (ref 4.10–5.70)
RDW: 15.9 % — ABNORMAL HIGH (ref 11.5–14.5)
WBC: 4.2 10*3/uL (ref 4.1–11.1)

## 2018-06-05 LAB — BASIC METABOLIC PANEL
Anion Gap: 7 mmol/L (ref 5–15)
BUN: 6 MG/DL (ref 6–20)
Bun/Cre Ratio: 7 — ABNORMAL LOW (ref 12–20)
CO2: 21 mmol/L (ref 21–32)
Calcium: 7.9 MG/DL — ABNORMAL LOW (ref 8.5–10.1)
Chloride: 113 mmol/L — ABNORMAL HIGH (ref 97–108)
Creatinine: 0.88 MG/DL (ref 0.70–1.30)
EGFR IF NonAfrican American: >60 mL/min/{1.73_m2} (ref >60)
GFR African American: >60 mL/min/{1.73_m2} (ref >60)
Glucose: 74 mg/dL (ref 65–100)
Potassium: 3.9 mmol/L (ref 3.5–5.1)
Sodium: 141 mmol/L (ref 136–145)

## 2018-06-05 LAB — CULTURE, TISSUE W GRAM STAIN

## 2018-06-05 LAB — PHOSPHORUS
Phosphorus: 3.6 MG/DL (ref 2.6–4.7)
Phosphorus: 3.6 MG/DL (ref 2.6–4.7)

## 2018-06-05 LAB — MAGNESIUM
Magnesium: 2.2 mg/dL (ref 1.6–2.4)
Magnesium: 2.2 mg/dL (ref 1.6–2.4)

## 2018-06-05 LAB — METABOLIC PANEL, BASIC
Anion gap: 7 mmol/L (ref 5–15)
BUN/Creatinine ratio: 7 — ABNORMAL LOW (ref 12–20)
BUN: 6 MG/DL (ref 6–20)
CO2: 21 mmol/L (ref 21–32)
Calcium: 7.9 MG/DL — ABNORMAL LOW (ref 8.5–10.1)
Chloride: 113 mmol/L — ABNORMAL HIGH (ref 97–108)
Creatinine: 0.88 MG/DL (ref 0.70–1.30)
GFR est AA: >60 mL/min/{1.73_m2} (ref >60)
GFR est non-AA: >60 mL/min/{1.73_m2} (ref >60)
Glucose: 74 mg/dL (ref 65–100)
Potassium: 3.9 mmol/L (ref 3.5–5.1)
Sodium: 141 mmol/L (ref 136–145)

## 2018-06-05 LAB — CBC W/O DIFF
ABSOLUTE NRBC: 0 10*3/uL (ref 0.00–0.01)
HCT: 30.7 % — ABNORMAL LOW (ref 36.6–50.3)
HGB: 9.9 g/dL — ABNORMAL LOW (ref 12.1–17.0)
MCH: 31.2 PG (ref 26.0–34.0)
MCHC: 32.2 g/dL (ref 30.0–36.5)
MCV: 96.8 FL (ref 80.0–99.0)
MPV: 11.6 FL (ref 8.9–12.9)
NRBC: 0 PER 100 WBC
PLATELET: 171 10*3/uL (ref 150–400)
RBC: 3.17 M/uL — ABNORMAL LOW (ref 4.10–5.70)
RDW: 15.9 % — ABNORMAL HIGH (ref 11.5–14.5)
WBC: 4.2 10*3/uL (ref 4.1–11.1)

## 2018-06-05 MED FILL — PIPERACILLIN-TAZOBACTAM 3.375 GRAM IV SOLR: 3.375 gram | INTRAVENOUS | Qty: 3.38

## 2018-06-05 MED FILL — ATORVASTATIN 20 MG TAB: 20 mg | ORAL | Qty: 4

## 2018-06-05 MED FILL — CYANOCOBALAMIN 500 MCG TAB: 500 mcg | ORAL | Qty: 1

## 2018-06-05 MED FILL — HEPARIN (PORCINE) 5,000 UNIT/ML IJ SOLN: 5000 unit/mL | INTRAMUSCULAR | Qty: 1

## 2018-06-05 MED FILL — TOTAL B/C TABLET: ORAL | Qty: 1

## 2018-06-05 MED FILL — FERROUS SULFATE 325 MG (65 MG ELEMENTAL IRON) TAB: 325 mg (65 mg iron) | ORAL | Qty: 1

## 2018-06-05 MED FILL — PREGABALIN 75 MG CAP: 75 mg | ORAL | Qty: 4

## 2018-06-05 MED FILL — DULOXETINE 30 MG CAP, DELAYED RELEASE: 30 mg | ORAL | Qty: 2

## 2018-06-05 MED FILL — VANCOMYCIN 1.25 GRAM IV SOLUTION: 1.25 gram | INTRAVENOUS | Qty: 1250

## 2018-06-05 NOTE — Progress Notes (Addendum)
[address]  [place], [place] [address]   Office [phone]  Fax [phone]          Assessment and Plan     Elijah [name] is a 68 y.o. male with a medical hx of polyneuropathy, OSAS (CPAP), HTN HLD, CAD with CABG x5 in 2011, Obesity and s/p Gastric Sleeve in 01/2018??admitted for R foot wound. He has spent 3 night(s) in the hospital    24 Hour Events: No acute events.    Left foot osteomyelitis, abscess of right foot. S/p I&D of R foot and incision of L 5th digit and metatarsal - Wcx prelim moderate staph, GBS, and light diptheriods. Appreciate podiatry recs.   - Vanc and Zosyn for broad spectrum coverage   -f/u blood and final wound cx   -daily CBC/CMP/mag/phos??  - wound care following  - consult to ID for abx management when get wound cx back     Hypotension in the setting of HTN: BP in PCP office today 107/66 and Recent 70 Lb weight loss from gastric bypass. POA BP ??118/66. ??Iron profile, TSH and B12 from 3/2 wnl. Orthostatics negative Echo nl cavity size. LVEF 55-60%  - decreased IVF to 1/2 maintenance rate , will stop when PO intake is sufficient   -will restart home Bystolic 10mg  daily as BP's trend, currently brady   -daily CBC/CMP/mag/phos??  ??  S/p Gastric Sleeve: 70 lb wt loss since 11/19   -continue home B complex   -continue home B12??  ??  Hyperlipidemia:??Last lipid panel on??1/20??and values were TC- 118, TG- 119, ??HDL-41, LDL-53,   -Continue home??Lipitor 40mg  daily   ??  Polyneuropathy: chronic bilateral below the knee   -continue home Lyrica 300mg  bid   -continue home Cymbalta 60mg  once daily??  ??  Sleep Apnea:   -continue CPAP qhs??  ??  Obesity??BMI Body mass index is 38.1 kg/m??.  - Encouraging lifestyle modifications and further follow up outpatient.       FEN/GI - Regular diet. 75 ml/h  Activity - Ambulate with assistance  DVT prophylaxis - Sub Q Heparin  GI prophylaxis - Not indicated at this time  Disposition - Plan to d/c to TBD. PT/OT following   Code Status - Full     I appreciate the opportunity to participate in the care of this patient,  Elijah December, MD  [facility] Medicine Resident         Subjective / Objective     Subjective: Patient does not have any complaints. Patient inquires how long he will have to be on abx. He has trip planned in the end of the month.     Dr Sherryll Burger at bedside and advising patient to cancel the trip d/t surgery and further treatment.     Temp (24hrs), Avg:98 ??F (36.7 ??C), Min:97.5 ??F (36.4 ??C), Max:98.5 ??F (36.9 ??C)     Physical Exam  Constitutional:       General: He is not in acute distress.     Appearance: He is not ill-appearing.   Cardiovascular:      Rate and Rhythm: Normal rate and regular rhythm.      Heart sounds: No murmur.   Pulmonary:      Effort: Pulmonary effort is normal.      Breath sounds: No wheezing.   Musculoskeletal:         General: No swelling.      Comments: Wounds are covered by dressing        Respiratory:  O2 Device: Room air     I/O:  Date 06/04/18 0700 - 06/05/18 0659 06/05/18 0700 - 06/06/18 0659   Shift 0700-1859 1900-0659 24 Hour Total 0700-1859 1900-0659 24 Hour Total   INTAKE   P.O. 240 600 840        P.O. 240 600 840      I.V.(mL/kg/hr) 1250(0.8)  1250(0.4)        I.V. 750  750        Volume (lactated Ringers infusion) 500  500      Shift Total(mL/kg) 1490(11.4) 600(4.6) 2956(21)      OUTPUT   Urine(mL/kg/hr) 800(0.5) 550(0.3) 1350(0.4)        Urine Voided [phone]      Shift Total(mL/kg) 800(6.1) 550(4.2) 1350(10.3)      NET 690 50 740      Weight (kg) 131 131 131 131 131 131       Inpatient Medications  Current Facility-Administered Medications   Medication Dose Route Frequency   ??? vancomycin (VANCOCIN) 1,250 mg in 0.9% sodium chloride (MBP/ADV) 250 mL  1,250 mg IntraVENous Q12H   ??? ferrous sulfate tablet 325 mg  1 Tab Oral BID WITH MEALS   ??? heparin (porcine) injection 5,000 Units  5,000 Units SubCUTAneous Q8H   ??? sodium chloride (NS) flush 5-40 mL  5-40 mL IntraVENous Q8H    ??? sodium chloride (NS) flush 5-40 mL  5-40 mL IntraVENous PRN   ??? 0.9% sodium chloride infusion  75 mL/hr IntraVENous CONTINUOUS   ??? acetaminophen (TYLENOL) tablet 650 mg  650 mg Oral Q4H PRN   ??? piperacillin-tazobactam (ZOSYN) 3.375 g in 0.9% sodium chloride (MBP/ADV) 100 mL  3.375 g IntraVENous Q8H   ??? atorvastatin (LIPITOR) tablet 80 mg  80 mg Oral DAILY   ??? b-complex with vitamin c tablet 1 Tab  1 Tab Oral DAILY   ??? cyanocobalamin (VITAMIN B12) tablet 500 mcg  500 mcg Oral DAILY   ??? DULoxetine (CYMBALTA) capsule 60 mg  60 mg Oral DAILY   ??? pregabalin (LYRICA) capsule 300 mg  300 mg Oral BID     Allergies  Allergies   Allergen Reactions   ??? Metoprolol Other (comments)     Patient states, made me feel horrible.     CBC:  Recent Labs     06/05/18  0304 06/04/18  0054 06/03/18  0543   WBC 4.2 5.6 4.3   HGB 9.9* 10.4* 10.7*   HCT 30.7* 31.7* 32.5*   PLT 171 166 150     Metabolic Panel:  Recent Labs     06/05/18  0304 06/04/18  0054 06/03/18  0543 06/02/18  1739   NA 141 142 144 139   K 3.9 3.6 3.6 3.8   CL 113* 114* 115* 109*   CO2 BUN CREA 0.88 0.96 0.88 1.10   GLU 74 118* 73 113*   CA 7.9* 7.8* 8.0* 8.7   MG 2.2 1.9 2.3  --    PHOS 3.6 2.4* 3.9  --    ALB  --   --   --  3.1*   SGOT  --   --   --  23   ALT  --   --   --  23          For Billing    Chief Complaint   Patient presents with   ??? Fatigue   ??? Foot  Problem       Hospital Problems  Date Reviewed: 2018-06-26          Codes Class Noted POA    Abscess of right foot ICD-10-CM: L02.611  ICD-9-CM: 682.7  06/04/2018 Unknown        Acute osteomyelitis of left foot (HCC) ICD-10-CM: M86.172  ICD-9-CM: 730.07  06/04/2018 Unknown        Cellulitis ICD-10-CM: L03.90  ICD-9-CM: 682.9  06/26/2018 Unknown        * (Principal) Right foot infection ICD-10-CM: L08.9  ICD-9-CM: 686.9  06/26/2018 Unknown        Polyneuropathy (Chronic) ICD-10-CM: G62.9  ICD-9-CM: 356.9  05/28/2016 Yes        Obesity ICD-10-CM: E66.9  ICD-9-CM: 278.00  04/24/2015 Unknown         Essential hypertension, benign (Chronic) ICD-10-CM: I10  ICD-9-CM: 401.1  02/21/2015 Yes    Overview Signed 08/10/2016  3:46 PM by Jimmey Ralph, LPN     Overview:   Qualifier: Diagnosis of   By: Claiborne Billings CMA, Jacqualynn              HLD (hyperlipidemia) (Chronic) ICD-10-CM: E78.5  ICD-9-CM: 272.4  02/21/2015 Yes        Coronary atherosclerosis of native coronary artery (Chronic) ICD-10-CM: I25.10  ICD-9-CM: 414.01  02/21/2015 Yes    Overview Addendum 08/10/2016  3:46 PM by Jimmey Ralph, LPN     S/p CABG x5. DOE was presenting complaint.             OSA on CPAP (Chronic) ICD-10-CM: X91.47, Z99.89  ICD-9-CM: 327.23, V46.8  02/21/2015 Yes        S/P CABG (coronary artery bypass graft) ICD-10-CM: Z95.1  ICD-9-CM: V45.81  08/23/2013 Yes    Overview Signed 08/10/2016  3:46 PM by Jimmey Ralph, LPN     Overview:   2009

## 2018-06-05 NOTE — Progress Notes (Signed)
Patient seen by OT. He is up in chair. He has an old post op shoe for left but currently nothing for R. He is PWBing on heel for R and WBAT for left. He will also need RW. He is able to tailor sit for LB dressing. Will need to address foot wear and obtain walker to further assess. Plan to discuss with evaluating PT.

## 2018-06-05 NOTE — Progress Notes (Signed)
OCCUPATIONAL THERAPY EVALUATION/DISCHARGE  Patient: Elijah [name] (68 y.o. male)  Date: 06/05/2018  Primary Diagnosis: Cellulitis [L03.90]  Right foot infection [L08.9]  Procedure(s) (LRB):  LEFT FOOT 5th METATARSAL RESECTION AND RIGHT FOOT I&D (Bilateral) 1 Day Post-Op   Precautions: PWBing heel on R, WBAT L   uses post op shoes    ASSESSMENT  Based on the objective data described below, the patient presents with intact UB strength and ability to tailor sit for LB ADLS. While he needs SBA for transfer and CGA mobility anticipate this till be limited by bilateral foot wounds and he will still be able to go home with wife. Discussed at length with patient and PT and from OT standpoint once setup does not have acute needs for ADL. Will continue working with PT to progress to Mod I on transfers.    Current Level of Function (ADLs/self-care): SBA/setup to Mod I    Functional Outcome Measure: The patient scored 75/100 on the Barthel Index outcome measure which is indicative of mild ADL/mobility deficit.      Other factors to consider for discharge: will need stair training     PLAN :  Recommend with staff: up to chair/bathroom    Recommendation for discharge: (in order for the patient to meet his/her long term goals)  No skilled occupational therapy/ follow up rehabilitation needs identified at this time. Assist of wife at home and will continue PT    This discharge recommendation:  A follow-up discussion with the attending provider and/or case management is planned    IF patient discharges home will need the following DME: patient owns DME required for discharge, unless PT recommends AD for walking safety, see PT note       SUBJECTIVE:   Patient stated ???I can get my shoes on, take my word.???    OBJECTIVE DATA SUMMARY:   HISTORY:   Past Medical History:   Diagnosis Date   ??? BPH (benign prostatic hyperplasia)    ??? CAD (coronary artery disease)     CABG x 5 2011- [place]ke [facility] [place] [facility] [place] [facility] [place] [facility]    ??? History of vascular access device 09/08/2016    [facility] VAT, 5 FR double, R brachial, 48 cm with 1 cm out, LTA   ??? HLD (hyperlipidemia)    ??? HTN (hypertension)    ??? Morbid obesity due to excess calories (HCC) 04/24/2015   ??? OSA (obstructive sleep apnea)    ??? Polyneuropathy    ??? Tubular adenoma of colon     x2 on colonoscopy 09/18/15     Past Surgical History:   Procedure Laterality Date   ??? HX CORONARY ARTERY BYPASS GRAFT     ??? HX GASTRIC BYPASS  01/2018    gastric sleeve   ??? HX HIP REPLACEMENT Bilateral     10 years ago   ??? HX ORTHOPAEDIC      right great and 2nd toe surgery       Prior Level of Function/Environment/Context: independent  Expanded or extensive additional review of patient history:   Home Situation  Home Environment: Private residence  One/Two Story Residence: Two story  Living Alone: No  Support Systems: Games developer  Patient Expects to be Discharged to:: Private residence  Current DME Used/Available at Home: CPAP, Cane, straight    Hand dominance: Right    EXAMINATION OF PERFORMANCE DEFICITS:  Cognitive/Behavioral Status:      calm and oriented  Skin: bilateral feet bandaged    Edema: none    Hearing:  Auditory  Auditory Impairment: None  Hearing Aids/Status: Does not own    Vision/Perceptual:                                Corrective Lenses: Glasses    Range of Motion:    AROM: Generally decreased, functional  PROM: Generally decreased, functional                      Strength:    Strength: Within functional limits                Coordination:  Coordination: Within functional limits            Tone & Sensation:    Tone: Normal  Sensation: Impaired                      Balance:  Sitting: Intact  Standing: Intact;With support    Functional Mobility and Transfers for ADLs:  Bed Mobility:  Sit to Supine: Independent    Transfers:  Sit to Stand: Stand-by assistance  Stand to Sit: Stand-by assistance  Bathroom Mobility: Stand-by assistance    ADL Assessment:   Feeding: Independent    Oral Facial Hygiene/Grooming: Independent    Bathing: Stand-by assistance;Setup    Upper Body Dressing: Setup    Lower Body Dressing: Stand-by assistance    Toileting: Stand by assistance                ADL Intervention and task modifications:    Lower Body Dressing Assistance  Shoes with Velcro: Set-up(post op shoe on left)  Leg Crossed Method Used: Yes  Position Performed: Seated in chair               Functional Measure:  Barthel Index:    Bathing: 0  Bladder: 10  Bowels: 10  Grooming: 5  Dressing: 5  Feeding: 10  Mobility: 10  Stairs: 5  Toilet Use: 10  Transfer (Bed to Chair and Back): 10  Total: 75/100        The Barthel ADL Index: Guidelines  1. The index should be used as a record of what a patient does, not as a record of what a patient could do.  2. The main aim is to establish degree of independence from any help, physical or verbal, however minor and for whatever reason.  3. The need for supervision renders the patient not independent.  4. A patient's performance should be established using the best available evidence. Asking the patient, friends/relatives and nurses are the usual sources, but direct observation and common sense are also important. However direct testing is not needed.  5. Usually the patient's performance over the preceding 24-48 hours is important, but occasionally longer periods will be relevant.  6. Middle categories imply that the patient supplies over 50 per cent of the effort.  7. Use of aids to be independent is allowed.    Clarisa Kindred., Barthel, D.W. [phone]). Functional evaluation: the Barthel Index. Md State Med J (14)2.  Zenaida Niece der [place], J.J.M.F, Horace, Ian Malkin., Margret Chance., [place], [place]. (1999). Measuring the change indisability after inpatient rehabilitation; comparison of the responsiveness of the Barthel Index and Functional Independence Measure. Journal of Neurology, Neurosurgery, and Psychiatry, 66(4), [phone].   Dawson Bills, N.J.A, [place],  W.J.M, & Koop[place]chap, M.A. (2004.) Assessment of  post-stroke quality of life in cost-effectiveness studies: The usefulness of the Barthel Index and the EuroQoL-5D. Quality of Life Research, 67, 832-91       Occupational Therapy Evaluation Charge Determination   History Examination Decision-Making   LOW Complexity : Brief history review  LOW Complexity : 1-3 performance deficits relating to physical, cognitive , or psychosocial skils that result in activity limitations and / or participation restrictions  LOW Complexity : No comorbidities that affect functional and no verbal or physical assistance needed to complete eval tasks       Based on the above components, the patient evaluation is determined to be of the following complexity level: LOW   Pain Rating:  None reported    Activity Tolerance:   Good  Please refer to the flowsheet for vital signs taken during this treatment.    After treatment patient left in no apparent distress:    Sitting in chair and Call bell within reach    COMMUNICATION/EDUCATION:   The patient???s plan of care was discussed with: Physical therapist and Registered nurse.     Thank you for this referral.  Lonia Mad  Time Calculation: 45 mins

## 2018-06-05 NOTE — Progress Notes (Signed)
Problem: Mobility Impaired (Adult and Pediatric)  Goal: *Acute Goals and Plan of Care (Insert Text)  Description  FUNCTIONAL STATUS PRIOR TO ADMISSION: Patient was independent and active without use of DME.    HOME SUPPORT PRIOR TO ADMISSION: The patient lived with wife.    Physical Therapy Goals  Initiated 06/05/2018  1.  Patient will transfer from bed to chair and chair to bed with modified independence using the least restrictive device within 7 day(s).  2.  Patient will perform sit to stand with modified independence within 7 day(s).  3.  Patient will ambulate with modified independence for 200 feet maintaining heel weight bearing on the right with the least restrictive device within 7 day(s).   4.  Patient will ascend/descend 12 stairs with one handrail(s) with minimal assistance/contact guard assist within 7 day(s).   06/05/2018 1436 by Riki Rusk, PT  Outcome: Progressing Towards Goal  06/05/2018 1148 by Riki Rusk, PT  Outcome: Progressing Towards Goal   PHYSICAL THERAPY TREATMENT  Patient: Elijah [name] (68 y.o. male)  Date: 06/05/2018  Diagnosis: Cellulitis [L03.90]  Right foot infection [L08.9]   Right foot infection  Procedure(s) (LRB):  LEFT FOOT 5th METATARSAL RESECTION AND RIGHT FOOT I&D (Bilateral) 1 Day Post-Op  Precautions:    Chart, physical therapy assessment, plan of care and goals were reviewed.    ASSESSMENT  Patient continues with skilled PT services and is progressing towards goals. Reviewed and assessed shoe options.  Assessed gait and consulted with another Physical Therapist to determine shoe options.  Smaller heel weight bearing shoes have the same floor contact length and so weight bear thru his foot in the same location.  Post op shoe narrow and with less support.  Best option is to continue with XL heel weight bearing shoe with step to gait.  Patient continues to require cues for shorter step length and step to leading with right technique.      Current Level of Function Impacting Discharge (mobility/balance): assist of one with gait, cues to maintain step to gait and to decrease step length    Other factors to consider for discharge: has two flights of steps to bedroom (in [place] home, where he states he will be [place] to)         PLAN :  Patient continues to benefit from skilled intervention to address the above impairments.  Continue treatment per established plan of care.  to address goals.    Recommendation for discharge: (in order for the patient to meet his/her long term goals)  Physical therapy at least 2 days/week in the home     This discharge recommendation:  Has not yet been discussed the attending provider and/or case management    IF patient discharges home will need the following DME: rolling walker  made need rw waiting to hear from his wife to see if he already has one       SUBJECTIVE:   Patient stated ???I'm waiting for some good news today.???    OBJECTIVE DATA SUMMARY:   Critical Behavior:              Functional Mobility Training:  Bed Mobility:        Sit to Supine: Independent           Transfers:  Sit to Stand: Stand-by assistance  Stand to Sit: Stand-by assistance  Balance:  Sitting: Intact  Standing: Intact;With support  Ambulation/Gait Training:  Distance (ft): 200 Feet (ft)  Assistive Device: Walker, rolling;Gait belt  Ambulation - Level of Assistance: Contact guard assistance;Assist x1;Other (comment)(cues needed for technique, sequencing and walker use)        Gait Abnormalities: Step to gait        Base of Support: Center of gravity altered;Widened;Shift to left        Step Length: Right shortened    Pain Rating:  Pt without complaints  Activity Tolerance:   Good  Please refer to the flowsheet for vital signs taken during this treatment.    After treatment patient left in no apparent distress:   Sitting up in bed     COMMUNICATION/COLLABORATION:    The patient???s plan of care was discussed with: Registered nurse.     Riki Rusk, PT   Time Calculation: 18 mins

## 2018-06-05 NOTE — Progress Notes (Signed)
Bedside shift change report given to Megan, Rn (oncoming nurse) by Janine, RN (offgoing nurse). Report included the following information SBAR and Kardex.

## 2018-06-05 NOTE — Progress Notes (Signed)
Problem: Mobility Impaired (Adult and Pediatric)  Goal: *Acute Goals and Plan of Care (Insert Text)  Description  FUNCTIONAL STATUS PRIOR TO ADMISSION: Patient was independent and active without use of DME.    HOME SUPPORT PRIOR TO ADMISSION: The patient lived with wife.    Physical Therapy Goals  Initiated 06/05/2018  1.  Patient will transfer from bed to chair and chair to bed with modified independence using the least restrictive device within 7 day(s).  2.  Patient will perform sit to stand with modified independence within 7 day(s).  3.  Patient will ambulate with modified independence for 200 feet maintaining heel weight bearing on the right with the least restrictive device within 7 day(s).   4.  Patient will ascend/descend 12 stairs with one handrail(s) with minimal assistance/contact guard assist within 7 day(s).   Outcome: Progressing Towards Goal   PHYSICAL THERAPY EVALUATION  Patient: Elijah [name] (68 y.o. male)  Date: 06/05/2018  Primary Diagnosis: Cellulitis [L03.90]  Right foot infection [L08.9]  Procedure(s) (LRB):  LEFT FOOT 5th METATARSAL RESECTION AND RIGHT FOOT I&D (Bilateral) 1 Day Post-Op   Precautions: Universal, Heel PWBing on Right, WBAT on left         ASSESSMENT  Based on the objective data described below, the patient presents with decreased mobility, assist needed with transfers,decreased balance due to base of support changes and general weakness.  Patient is POD 1 of left foot fifth toe metatarsal resection and bilateral I&D.  Patient with a history of gastric sleeve surgery Nov 2019, has lost 70 pounds since then, but he states he has not been eating much and so his nutrition has been poor lately.  Assessed patient's right foot dressing - bulk of dressing appears in the arch of the foot.  Performed transfer training and gait training with a rolling walker and heel weight bearing shoe (XL) on right (left WBAT and flat shoe).   Instructed in step to leading with the right,  patient however needs continued cues to decrease step length and continue with step to.  Patient with good balance, no reports of pain, and good walker use.  Will continue to assess right shoe to decreased weight bearing through arch.    Current Level of Function Impacting Discharge (mobility/balance): transfers and gait with stand by assistance, evaluating best foot wear for patient to maintain heel weight bearing on right    Functional Outcome Measure:  The patient scored 16 out of 28 on the Tinetti outcome measure which is indicative of high fall risk.      Other factors to consider for discharge: fall history, two flights of steps up to bedroom  (in [place] home)     Patient will benefit from skilled therapy intervention to address the above noted impairments.       PLAN :  Recommendations and Planned Interventions: bed mobility training, transfer training, gait training, therapeutic exercises, patient and family training/education, and therapeutic activities      Frequency/Duration: Patient will be followed by physical therapy:  5 times a week to address goals.    Recommendation for discharge: (in order for the patient to meet his/her long term goals)  Physical therapy at least 2 days/week in the home     This discharge recommendation:  Has not yet been discussed the attending provider and/or case management    IF patient discharges home will need the following DME: rolling walker, he is checking to see if he still has one at home  SUBJECTIVE:   Patient stated ???I'm not sure how to get the pressure off [the arch].???    OBJECTIVE DATA SUMMARY:   HISTORY:    Past Medical History:   Diagnosis Date    BPH (benign prostatic hyperplasia)     CAD (coronary artery disease)     CABG x 5 2011- nuke with [place] basal inferior [facility] [place] [facility]    History of vascular access device 09/08/2016    [facility] VAT, 5 FR double, R brachial, 48 cm with 1 cm out, LTA    HLD (hyperlipidemia)     HTN (hypertension)      Morbid obesity due to excess calories (HCC) 04/24/2015    OSA (obstructive sleep apnea)     Polyneuropathy     Tubular adenoma of colon     x2 on colonoscopy 09/18/15     Past Surgical History:   Procedure Laterality Date    HX CORONARY ARTERY BYPASS GRAFT      HX GASTRIC BYPASS  01/2018    gastric sleeve    HX HIP REPLACEMENT Bilateral     10 years ago    HX ORTHOPAEDIC      right great and 2nd toe surgery         Home Situation  Home Environment: Private residence  One/Two Story Residence: Two story  Living Alone: No  Support Systems: Games developer  Patient Expects to be Discharged to:: Private residence  Current DME Used/Available at Home: CPAP, Cane, straight    EXAMINATION/PRESENTATION/DECISION MAKING:   Critical Behavior:              Hearing:  Auditory  Auditory Impairment: None  Hearing Aids/Status: Does not own  Range Of Motion:  AROM: Generally decreased, functional           PROM: Generally decreased, functional           Strength:    Strength: Within functional limits                    Tone & Sensation:   Tone: Normal              Sensation: Impaired               Coordination:  Coordination: Within functional limits  Vision:      Functional Mobility:  Bed Mobility:        Sit to Supine: Independent     Transfers:  Sit to Stand: Stand-by assistance  Stand to Sit: Stand-by assistance                       Balance:   Sitting: Intact  Standing: Intact;With support  Ambulation/Gait Training:  Distance (ft): 200 Feet (ft)  Assistive Device: Walker, rolling;Gait belt  Ambulation - Level of Assistance: Contact guard assistance;Assist x1;Other (comment)(cues needed for technique, sequencing and walker use)        Gait Abnormalities: Step to gait        Base of Support: Center of gravity altered;Widened;Shift to left        Step Length: Right shortened             Functional Measure:  Tinetti test:    Sitting Balance: 1  Arises: 1  Attempts to Rise: 1  Immediate Standing Balance: 2   Standing Balance: 1  Nudged: 2  Eyes Closed: 1  Turn 360 Degrees - Continuous/Discontinuous: 0  Turn 360 Degrees - Steady/Unsteady:  1  Sitting Down: 1  Balance Score: 11 Balance total score  Indication of Gait: 1  R Step Length/Height: 1  L Step Length/Height: 0  R Foot Clearance: 1  L Foot Clearance: 1  Step Symmetry: 0  Step Continuity: 0  Path: 1  Trunk: 0  Walking Time: 0  Gait Score: 5 Gait total score  Total Score: 16/28 Overall total score         Tinetti Tool Score Risk of Falls  <19 = High Fall Risk  19-24 = Moderate Fall Risk  25-28 = Low Fall Risk  Tinetti ME. Performance-Oriented Assessment of Mobility Problems in Elderly Patients. JAGS 1986; [id number]. (Scoring Description: PT Bulletin Feb. 10, 1993)    Older adults: Lonn Georgia et al, 2009; n = 1000 [place] elderly evaluated with ABC, POMA, ADL, and IADL)  ?? Mean POMA score for males aged 65-79 years = 26.21(3.40)  ?? Mean POMA score for females age 67-79 years = 25.16(4.30)  ?? Mean POMA score for males over 80 years = 23.29(6.02)  ?? Mean POMA score for females over 80 years = 17.20(8.32)           Physical Therapy Evaluation Charge Determination   History Examination Presentation Decision-Making   MEDIUM  Complexity : 1-2 comorbidities / personal factors will impact the outcome/ POC  MEDIUM Complexity : 3 Standardized tests and measures addressing body structure, function, activity limitation and / or participation in recreation  LOW Complexity : Stable, uncomplicated  LOW Complexity : FOTO score of 75-100      Based on the above components, the patient evaluation is determined to be of the following complexity level: LOW     Pain Rating:  "it doesn't really hurt"    Activity Tolerance:   Good  Please refer to the flowsheet for vital signs taken during this treatment.    After treatment patient left in no apparent distress:   Sitting in chair, Heels elevated for pressure relief, Call bell within reach, Bed / chair alarm activated, and Side rails x 3     COMMUNICATION/EDUCATION:   The patient???s plan of care was discussed with: Registered nurse and wound care nurse.     Fall prevention education was provided and the patient/caregiver indicated understanding., Patient/family have participated as able in goal setting and plan of care., and Patient/family agree to work toward stated goals and plan of care.    Thank you for this referral.  Riki Rusk, PT   Time Calculation: 48 mins

## 2018-06-05 NOTE — Progress Notes (Signed)
CM Note:  Met with pt for d/c planning.  Pt stated he was independent with ADL's, was driving and was using a cane at the time of the infection.  Otherwise, he walked unaided.  He also has Cpap at home with supplies from the [place].  Medications from [id number] in [place], [place] at [place].  Reason for Admission:   Cellulitis , R foot infection                  RUR Score:     13             PCP: First and Last name:  Charm Barges   Name of Practice: [facility]   Are you a current patient: Yes/No:   Yes   Approximate date of last visit: 06/02/18    Do you (patient/family) have any concerns for transition/discharge?   no                Plan for utilizing home health:   hh recommended, will need an agency in [place]    Current Advanced Directive/Advance Care Plan:  No; his wife is his next of kin            Transition of Care Plan:       1. IV abx--for home  2. Will need a PICC  3  Podiatry following  4. Wound care consult  5. PT/OT consult  6.  Home health recommended  7. May need a RW  8 pt to arrange transport to [place]  L.Olive Bass, RN    Care Management Interventions  PCP Verified by CM: Yes  Physical Therapy Consult: Yes  Occupational Therapy Consult: Yes  Current Support Network: Lives with Spouse, Own Home(4 story house in [place] with 14 entry stairs and 13 interior stairs.)  Confirm Follow Up Transport: Other (see comment)  Discharge Location  Discharge Placement: Home

## 2018-06-05 NOTE — Progress Notes (Signed)
[facility]   Podiatric Surgery  Post-Op Note    Subjective:   Admit Date: 06/02/2018  OR Date: 06/04/2018  Post-op: 1 Day Post-Op  Status Post: Incision and drainage, right foot, multiple fascial spaces, Incision of bone cortex, left fifth digit proximal phalanx, Incision of bone cortex, left fifth metatarsal.    Elijah [name]  appears; alert, well appearing, and in no distress and oriented to person, place, and time  Pain control is: excellent    Objective::    height is 6\' 1"  (1.854 m) and weight is 131 kg (288 lb 12.8 oz). His temperature is 98 ??F (36.7 ??C). His blood pressure is 158/99 (abnormal) and his pulse is 55 (abnormal). His respiration is 18 and oxygen saturation is 97%.     Intake/Output Summary (Last 24 hours) at 06/05/2018 0618  Last data filed at 06/05/2018 0304  Gross per 24 hour   Intake 2090 ml   Output 1050 ml   Net 1040 ml       Physical Exam:  Incision: : well approximated; packing intact to the right foot; No drainage, no pus, no purulence on compression  Wound left foot sub 5th metatarsal head - no drainage, no pus, no purulence  Abdomen: soft, nontender, nondistended, no masses or organomegaly  DVT Exam:No evidence of DVT seen on physical exam.    EXTENDED exam: CRT to distal digits is WNL    Labs:  Lab Results   Component Value Date/Time    WBC 4.2 06/05/2018 03:04 AM    HGB 9.9 (L) 06/05/2018 03:04 AM    HCT 30.7 (L) 06/05/2018 03:04 AM    MCV 96.8 06/05/2018 03:04 AM    PLATELET 171 06/05/2018 03:04 AM     Lab Results   Component Value Date/Time    Sodium 141 06/05/2018 03:04 AM    Potassium 3.9 06/05/2018 03:04 AM    Chloride 113 (H) 06/05/2018 03:04 AM    CO2 21 06/05/2018 03:04 AM    Phosphorus 3.6 06/05/2018 03:04 AM    BUN 6 06/05/2018 03:04 AM    Calcium 7.9 (L) 06/05/2018 03:04 AM      Lab Results   Component Value Date/Time    Glucose 74 06/05/2018 03:04 AM       Impression/Plan:   Principal Problem:    Right foot infection (06/02/2018)    Active Problems:     Essential hypertension, benign (02/21/2015)      Overview: Overview:       Qualifier: Diagnosis of       By: Claiborne Billings CMA, Jacqualynn       HLD (hyperlipidemia) (02/21/2015)      Coronary atherosclerosis of native coronary artery (02/21/2015)      Overview: S/p CABG x5. DOE was presenting complaint.      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  S/P right foot I&D and left foot bone biopsy x2  Plan:  ? Patient seen and evaluated.  ? Dressings changed to bilateral feet.  ? Awaiting pathology results from OR.  ? Patient is  considering second opinion pending results of the bone biopsy.  ? Continue IV antibiotics  ? PWBAT to the right heel and WBAT to the LEFT foot  ? Elevate b/l lower extremities at rest  ? Following.    Jarissa Sheriff A. Sherryll Burger, DPM  [phone] (o)  June 05, 2018  6:18 AM

## 2018-06-05 NOTE — Progress Notes (Addendum)
Progress note:    1)Tried to reach out to patient's wife to update on current plan. No answer.   Will try to call later.   Merylin [phone]    2)Patient informed nursing he wants him home medications to be sent to [facility]. Likely will get printed scripts.     3)Spoke to Dr Steinberg's office assistant Tynisha. Patient follows with Dr Steinberg since 2018. Was not able to talk to provider, who is in OR at this time. Left a call back number for [facility] team, and left Dr Shah's name for assistant to relate to Dr Steinberg.     Release of medical records form ordered.     Reynald Woods, MD

## 2018-06-05 NOTE — Progress Notes (Signed)
Orders received, chart reviewed and patient evaluated by physical therapy. Pending progression with skilled acute physical therapy, recommend:  Physical therapy at least 2 days/week in the home     Recommend with nursing patient to complete as able in order to maintain strength, endurance and independence: OOB to chair 3x/day with assist of one and ambulating with the rolling walker. Thank you for your assistance.     Full evaluation to follow.

## 2018-06-05 NOTE — Wound Image (Signed)
68 y.o. male with PMH including polyneuropathy (with history of right foot wounds and osteo - per chart review), sleep apnea (CPAP), HTN, HLD, CAD (CABG x5), BPH, Tubular adenoma of colon and Obesity (S/P Gastric Sleeve 01/2018). Patient developed sores on both feet that have been followed by podiatry and recently worsened. Wound care consulted for evaluation of foot wounds and forehead wound present on admission.     Assessment:  Patient is up in chair working with PT. He ambulates well and has no complaints at this time. Patient underwent Excision and drainage, right foot, multiple fascial spaces. Incision of bone cortex, left fifth digit proximal phalanx. Incision of bone cortex, left fifth metatarsal. All on 06/04/18.   Patient states podiatry has seen him this morning for the BLE foot wounds. Will Defer to podiatry for ongoing management.   Patient also has a resolving wound to the right brow. Per patient he fell in the bathroom secondary to worsening dizziness and fatigue that he associates with his recent gastric sleeve surgery. The fall resulted in an injury to the right brow requiring 16+ sutures. The brow is scarred except for a linear scab to the proximal lateral brow and a small 0.5cm bleeding area to the distal lateral brow. Patient states he uses a silver dressing to the brow at home to keep from bleeding.    Cleansed the brow and applied alginate over the bleeding wound bed. Covered and secured with Band-Aid.     Recommendation:  Keep Alginate in place to the brow (to act as a scab). If brow begins to bleed remove alginate, cleanse and apply new alginate dressing to the wound bed. Change Band-Aid daily and assess wound site.     Consult as needed,   Harlen Labs BSN RN [facility]

## 2018-06-05 NOTE — Progress Notes (Signed)
Problem: Mobility Impaired (Adult and Pediatric)  Goal: *Acute Goals and Plan of Care (Insert Text)  Description  FUNCTIONAL STATUS PRIOR TO ADMISSION: Patient was independent and active without use of DME.    HOME SUPPORT PRIOR TO ADMISSION: The patient lived with wife.    Physical Therapy Goals  Initiated 06/05/2018  1.  Patient will transfer from bed to chair and chair to bed with modified independence using the least restrictive device within 7 day(s).  2.  Patient will perform sit to stand with modified independence within 7 day(s).  3.  Patient will ambulate with modified independence for 200 feet maintaining heel weight bearing on the right with the least restrictive device within 7 day(s).   4.  Patient will ascend/descend 12 stairs with one handrail(s) with minimal assistance/contact guard assist within 7 day(s).   06/05/2018 1436 by Riki Rusk, PT  Outcome: Progressing Towards Goal  06/05/2018 1148 by Riki Rusk, PT  Outcome: Progressing Towards Goal   PHYSICAL THERAPY TREATMENT  Patient: Elijah [name] (68 y.o. male)  Date: 06/05/2018  Diagnosis: Cellulitis [L03.90]  Right foot infection [L08.9]   Right foot infection  Procedure(s) (LRB):  LEFT FOOT 5th METATARSAL RESECTION AND RIGHT FOOT I&D (Bilateral) 1 Day Post-Op  Precautions:    Chart, physical therapy assessment, plan of care and goals were reviewed.    ASSESSMENT  Patient continues with skilled PT services and is progressing towards goals. Reviewed and assessed shoe options.  Assessed gait and consulted with another Physical Therapist to determine shoe options.  Smaller heel weight bearing shoes have the same floor contact length and so weight bear thru his foot in the same location.  Post op shoe narrow and with less support.  Best option is to continue with XL heel weight bearing shoe with step to gait.  Patient continues to require cues for shorter step length and step to leading with right technique.     Current Level of Function Impacting  Discharge (mobility/balance): assist of one with gait, cues to maintain step to gait and to decrease step length    Other factors to consider for discharge: has two flights of steps to bedroom (in [place] home, where he states he will be DC to)         PLAN :  Patient continues to benefit from skilled intervention to address the above impairments.  Continue treatment per established plan of care.  to address goals.    Recommendation for discharge: (in order for the patient to meet his/her long term goals)  Physical therapy at least 2 days/week in the home     This discharge recommendation:  Has not yet been discussed the attending provider and/or case management    IF patient discharges home will need the following DME: rolling walker  made need rw waiting to hear from his wife to see if he already has one       SUBJECTIVE:   Patient stated "I'm waiting for some good news today."    OBJECTIVE DATA SUMMARY:   Critical Behavior:              Functional Mobility Training:  Bed Mobility:        Sit to Supine: Independent           Transfers:  Sit to Stand: Stand-by assistance  Stand to Sit: Stand-by assistance  Balance:  Sitting: Intact  Standing: Intact;With support  Ambulation/Gait Training:  Distance (ft): 200 Feet (ft)  Assistive Device: Walker, rolling;Gait belt  Ambulation - Level of Assistance: Contact guard assistance;Assist x1;Other (comment)(cues needed for technique, sequencing and walker use)        Gait Abnormalities: Step to gait        Base of Support: Center of gravity altered;Widened;Shift to left        Step Length: Right shortened    Pain Rating:  Pt without complaints  Activity Tolerance:   Good  Please refer to the flowsheet for vital signs taken during this treatment.    After treatment patient left in no apparent distress:   Sitting up in bed     COMMUNICATION/COLLABORATION:   The patient's plan of care was discussed with: Registered nurse.     Riki Rusk, PT   Time  Calculation: 18 mins

## 2018-06-05 NOTE — Progress Notes (Signed)
 OCCUPATIONAL THERAPY EVALUATION/DISCHARGE  Patient: Elijah [name] (68 y.o. male)  Date: 06/05/2018  Primary Diagnosis: Cellulitis [L03.90]  Right foot infection [L08.9]  Procedure(s) (LRB):  LEFT FOOT 5th METATARSAL RESECTION AND RIGHT FOOT I&D (Bilateral) 1 Day Post-Op   Precautions: PWBing heel on R, WBAT L   uses post op shoes    ASSESSMENT  Based on the objective data described below, the patient presents with intact UB strength and ability to tailor sit for LB ADLS. While he needs SBA for transfer and CGA mobility anticipate this till be limited by bilateral foot wounds and he will still be able to go home with wife. Discussed at length with patient and PT and from OT standpoint once setup does not have acute needs for ADL. Will continue working with PT to progress to Mod I on transfers.    Current Level of Function (ADLs/self-care): SBA/setup to Mod I    Functional Outcome Measure: The patient scored 75/100 on the Barthel Index outcome measure which is indicative of mild ADL/mobility deficit.      Other factors to consider for discharge: will need stair training     PLAN :  Recommend with staff: up to chair/bathroom    Recommendation for discharge: (in order for the patient to meet his/her long term goals)  No skilled occupational therapy/ follow up rehabilitation needs identified at this time. Assist of wife at home and will continue PT    This discharge recommendation:  A follow-up discussion with the attending provider and/or case management is planned    IF patient discharges home will need the following DME: patient owns DME required for discharge, unless PT recommends AD for walking safety, see PT note       SUBJECTIVE:   Patient stated "I can get my shoes on, take my word."    OBJECTIVE DATA SUMMARY:   HISTORY:   Past Medical History:   Diagnosis Date   . BPH (benign prostatic hyperplasia)    . CAD (coronary artery disease)     CABG x 5 2011- [facility] with fixed basal inferior [place]   .  History of vascular access device 09/08/2016    [facility] VAT, 5 FR double, R brachial, 48 cm with 1 cm out, LTA   . HLD (hyperlipidemia)    . HTN (hypertension)    . Morbid obesity due to excess calories (HCC) 04/24/2015   . OSA (obstructive sleep apnea)    . Polyneuropathy    . Tubular adenoma of colon     x2 on colonoscopy 09/18/15     Past Surgical History:   Procedure Laterality Date   . HX CORONARY ARTERY BYPASS GRAFT     . HX GASTRIC BYPASS  01/2018    gastric sleeve   . HX HIP REPLACEMENT Bilateral     10 years ago   . HX ORTHOPAEDIC      right great and 2nd toe surgery       Prior Level of Function/Environment/Context: independent  Expanded or extensive additional review of patient history:   Home Situation  Home Environment: Private residence  One/Two Story Residence: Two story  Living Alone: No  Support Systems: Games developer  Patient Expects to be Discharged to:: Private residence  Current DME Used/Available at Home: CPAP, Cane, straight    Hand dominance: Right    EXAMINATION OF PERFORMANCE DEFICITS:  Cognitive/Behavioral Status:      calm and oriented  Skin: bilateral feet bandaged    Edema: none    Hearing:  Auditory  Auditory Impairment: None  Hearing Aids/Status: Does not own    Vision/Perceptual:                                Corrective Lenses: Glasses    Range of Motion:    AROM: Generally decreased, functional  PROM: Generally decreased, functional                      Strength:    Strength: Within functional limits                Coordination:  Coordination: Within functional limits            Tone & Sensation:    Tone: Normal  Sensation: Impaired                      Balance:  Sitting: Intact  Standing: Intact;With support    Functional Mobility and Transfers for ADLs:  Bed Mobility:  Sit to Supine: Independent    Transfers:  Sit to Stand: Stand-by assistance  Stand to Sit: Stand-by assistance  Bathroom Mobility: Stand-by assistance    ADL Assessment:  Feeding:  Independent    Oral Facial Hygiene/Grooming: Independent    Bathing: Stand-by assistance;Setup    Upper Body Dressing: Setup    Lower Body Dressing: Stand-by assistance    Toileting: Stand by assistance                ADL Intervention and task modifications:    Lower Body Dressing Assistance  Shoes with Velcro: Set-up(post op shoe on left)  Leg Crossed Method Used: Yes  Position Performed: Seated in chair               Functional Measure:  Barthel Index:    Bathing: 0  Bladder: 10  Bowels: 10  Grooming: 5  Dressing: 5  Feeding: 10  Mobility: 10  Stairs: 5  Toilet Use: 10  Transfer (Bed to Chair and Back): 10  Total: 75/100        The Barthel ADL Index: Guidelines  1. The index should be used as a record of what a patient does, not as a record of what a patient could do.  2. The main aim is to establish degree of independence from any help, physical or verbal, however minor and for whatever reason.  3. The need for supervision renders the patient not independent.  4. A patient's performance should be established using the best available evidence. Asking the patient, friends/relatives and nurses are the usual sources, but direct observation and common sense are also important. However direct testing is not needed.  5. Usually the patient's performance over the preceding 24-48 hours is important, but occasionally longer periods will be relevant.  6. Middle categories imply that the patient supplies over 50 per cent of the effort.  7. Use of aids to be independent is allowed.    Henriette Desanctis., Barthel, D.W. [phone]). Functional evaluation: the Barthel Index. Md State Med J (14)2.  Fleeta [place], J.J.M.F, [place], DAIVD., [place], [place]. (1999). Measuring the change indisability after inpatient rehabilitation; comparison of the responsiveness of the Barthel Index and Functional Independence Measure. Journal of Neurology, Neurosurgery, and Psychiatry, 66(4), [phone].  Fleeta Chilling[place], N.J.A, [place],  W.J.M,  & [place], M.A. (2004.) Assessment of  post-stroke quality of life in cost-effectiveness studies: The usefulness of the Barthel Index and the EuroQoL-5D. Quality of Life Research, 30, 572-56       Occupational Therapy Evaluation Charge Determination   History Examination Decision-Making   LOW Complexity : Brief history review  LOW Complexity : 1-3 performance deficits relating to physical, cognitive , or psychosocial skils that result in activity limitations and / or participation restrictions  LOW Complexity : No comorbidities that affect functional and no verbal or physical assistance needed to complete eval tasks       Based on the above components, the patient evaluation is determined to be of the following complexity level: LOW   Pain Rating:  None reported    Activity Tolerance:   Good  Please refer to the flowsheet for vital signs taken during this treatment.    After treatment patient left in no apparent distress:    Sitting in chair and Call bell within reach    COMMUNICATION/EDUCATION:   The patient's plan of care was discussed with: Physical therapist and Registered nurse.     Thank you for this referral.  Jaclyn P Eldridge  Time Calculation: 45 mins

## 2018-06-05 NOTE — Progress Notes (Signed)
 Problem: Mobility Impaired (Adult and Pediatric)  Goal: *Acute Goals and Plan of Care (Insert Text)  Description  FUNCTIONAL STATUS PRIOR TO ADMISSION: Patient was independent and active without use of DME.    HOME SUPPORT PRIOR TO ADMISSION: The patient lived with wife.    Physical Therapy Goals  Initiated 06/05/2018  1.  Patient will transfer from bed to chair and chair to bed with modified independence using the least restrictive device within 7 day(s).  2.  Patient will perform sit to stand with modified independence within 7 day(s).  3.  Patient will ambulate with modified independence for 200 feet maintaining heel weight bearing on the right with the least restrictive device within 7 day(s).   4.  Patient will ascend/descend 12 stairs with one handrail(s) with minimal assistance/contact guard assist within 7 day(s).   Outcome: Progressing Towards Goal   PHYSICAL THERAPY EVALUATION  Patient: Elijah [name] (68 y.o. male)  Date: 06/05/2018  Primary Diagnosis: Cellulitis [L03.90]  Right foot infection [L08.9]  Procedure(s) (LRB):  LEFT FOOT 5th METATARSAL RESECTION AND RIGHT FOOT I&D (Bilateral) 1 Day Post-Op   Precautions: Universal, Heel PWBing on Right, WBAT on left         ASSESSMENT  Based on the objective data described below, the patient presents with decreased mobility, assist needed with transfers,decreased balance due to base of support changes and general weakness.  Patient is POD 1 of left foot fifth toe metatarsal resection and bilateral I&D.  Patient with a history of gastric sleeve surgery Nov 2019, has lost 70 pounds since then, but he states he has not been eating much and so his nutrition has been poor lately.  Assessed patient's right foot dressing - bulk of dressing appears in the arch of the foot.  Performed transfer training and gait training with a rolling walker and heel weight bearing shoe (XL) on right (left WBAT and flat shoe).   Instructed in step to leading with the right, patient  however needs continued cues to decrease step length and continue with step to.  Patient with good balance, no reports of pain, and good walker use.  Will continue to assess right shoe to decreased weight bearing through arch.    Current Level of Function Impacting Discharge (mobility/balance): transfers and gait with stand by assistance, evaluating best foot wear for patient to maintain heel weight bearing on right    Functional Outcome Measure:  The patient scored 16 out of 28 on the Tinetti outcome measure which is indicative of high fall risk.      Other factors to consider for discharge: fall history, two flights of steps up to bedroom  (in [place] home)     Patient will benefit from skilled therapy intervention to address the above noted impairments.       PLAN :  Recommendations and Planned Interventions: bed mobility training, transfer training, gait training, therapeutic exercises, patient and family training/education, and therapeutic activities      Frequency/Duration: Patient will be followed by physical therapy:  5 times a week to address goals.    Recommendation for discharge: (in order for the patient to meet his/her long term goals)  Physical therapy at least 2 days/week in the home     This discharge recommendation:  Has not yet been discussed the attending provider and/or case management    IF patient discharges home will need the following DME: rolling walker, he is checking to see if he still has one at home  SUBJECTIVE:   Patient stated "I'm not sure how to get the pressure off [the arch]."    OBJECTIVE DATA SUMMARY:   HISTORY:    Past Medical History:   Diagnosis Date    BPH (benign prostatic hyperplasia)     CAD (coronary artery disease)     CABG x 5 2011- [facility] with [facility] basal inferior [facility]    History of vascular access device 09/08/2016    [facility] VAT, 5 FR double, R brachial, 48 cm with 1 cm out, LTA    HLD (hyperlipidemia)     HTN (hypertension)     Morbid obesity due  to excess calories (HCC) 04/24/2015    OSA (obstructive sleep apnea)     Polyneuropathy     Tubular adenoma of colon     x2 on colonoscopy 09/18/15     Past Surgical History:   Procedure Laterality Date    HX CORONARY ARTERY BYPASS GRAFT      HX GASTRIC BYPASS  01/2018    gastric sleeve    HX HIP REPLACEMENT Bilateral     10 years ago    HX ORTHOPAEDIC      right great and 2nd toe surgery         Home Situation  Home Environment: Private residence  One/Two Story Residence: Two story  Living Alone: No  Support Systems: Games developer  Patient Expects to be Discharged to:: Private residence  Current DME Used/Available at Home: CPAP, Cane, straight    EXAMINATION/PRESENTATION/DECISION MAKING:   Critical Behavior:              Hearing:  Auditory  Auditory Impairment: None  Hearing Aids/Status: Does not own  Range Of Motion:  AROM: Generally decreased, functional           PROM: Generally decreased, functional           Strength:    Strength: Within functional limits                    Tone & Sensation:   Tone: Normal              Sensation: Impaired               Coordination:  Coordination: Within functional limits  Vision:      Functional Mobility:  Bed Mobility:        Sit to Supine: Independent     Transfers:  Sit to Stand: Stand-by assistance  Stand to Sit: Stand-by assistance                       Balance:   Sitting: Intact  Standing: Intact;With support  Ambulation/Gait Training:  Distance (ft): 200 Feet (ft)  Assistive Device: Walker, rolling;Gait belt  Ambulation - Level of Assistance: Contact guard assistance;Assist x1;Other (comment)(cues needed for technique, sequencing and walker use)        Gait Abnormalities: Step to gait        Base of Support: Center of gravity altered;Widened;Shift to left        Step Length: Right shortened             Functional Measure:  Tinetti test:    Sitting Balance: 1  Arises: 1  Attempts to Rise: 1  Immediate Standing Balance: 2  Standing Balance: 1  Nudged:  2  Eyes Closed: 1  Turn 360 Degrees - Continuous/Discontinuous: 0  Turn 360 Degrees - Steady/Unsteady:  1  Sitting Down: 1  Balance Score: 11 Balance total score  Indication of Gait: 1  R Step Length/Height: 1  L Step Length/Height: 0  R Foot Clearance: 1  L Foot Clearance: 1  Step Symmetry: 0  Step Continuity: 0  Path: 1  Trunk: 0  Walking Time: 0  Gait Score: 5 Gait total score  Total Score: 16/28 Overall total score         Tinetti Tool Score Risk of Falls  <19 = High Fall Risk  19-24 = Moderate Fall Risk  25-28 = Low Fall Risk  Tinetti ME. Performance-Oriented Assessment of Mobility Problems in Elderly Patients. JAGS 1986; [id number]. (Scoring Description: PT Bulletin Feb. 10, 1993)    Older adults: Gaines et al, 2009; n = 1000 [place] elderly evaluated with ABC, POMA, ADL, and IADL)   Mean POMA score for males aged 65-79 years = 26.21(3.40)   Mean POMA score for females age 74-79 years = 25.16(4.30)   Mean POMA score for males over 80 years = 23.29(6.02)   Mean POMA score for females over 80 years = 17.20(8.32)           Physical Therapy Evaluation Charge Determination   History Examination Presentation Decision-Making   MEDIUM  Complexity : 1-2 comorbidities / personal factors will impact the outcome/ POC  MEDIUM Complexity : 3 Standardized tests and measures addressing body structure, function, activity limitation and / or participation in recreation  LOW Complexity : Stable, uncomplicated  LOW Complexity : FOTO score of 75-100      Based on the above components, the patient evaluation is determined to be of the following complexity level: LOW     Pain Rating:  it doesn't really hurt    Activity Tolerance:   Good  Please refer to the flowsheet for vital signs taken during this treatment.    After treatment patient left in no apparent distress:   Sitting in chair, Heels elevated for pressure relief, Call bell within reach, Bed / chair alarm activated, and Side rails x 3    COMMUNICATION/EDUCATION:   The  patient's plan of care was discussed with: Registered nurse and wound care nurse.     Fall prevention education was provided and the patient/caregiver indicated understanding., Patient/family have participated as able in goal setting and plan of care., and Patient/family agree to work toward stated goals and plan of care.    Thank you for this referral.  Olam Meier, PT   Time Calculation: 48 mins

## 2018-06-05 NOTE — Progress Notes (Signed)
Orders received, chart reviewed and patient evaluated by physical therapy. Pending progression with skilled acute physical therapy, recommend:  Physical therapy at least 2 days/week in the home     Recommend with nursing patient to complete as able in order to maintain strength, endurance and independence: OOB to chair 3x/day with assist of one and ambulating with the rolling walker. Thank you for your assistance.     Full evaluation to follow.

## 2018-06-05 NOTE — Progress Notes (Signed)
[facility]   Podiatric Surgery  Post-Op Note    Subjective:   Admit Date: 06/02/2018  OR Date: 06/04/2018  Post-op: 1 Day Post-Op  Status Post: Incision and drainage, right foot, multiple fascial spaces, Incision of bone cortex, left fifth digit proximal phalanx, Incision of bone cortex, left fifth metatarsal.    [name]  appears; alert, well appearing, and in no distress and oriented to person, place, and time  Pain control is: excellent    Objective::    height is 6\' 1"  (1.854 m) and weight is 131 kg (288 lb 12.8 oz). His temperature is 98 ??F (36.7 ??C). His blood pressure is 158/99 (abnormal) and his pulse is 55 (abnormal). His respiration is 18 and oxygen saturation is 97%.     Intake/Output Summary (Last 24 hours) at 06/05/2018 0618  Last data filed at 06/05/2018 0304  Gross per 24 hour   Intake 2090 ml   Output 1050 ml   Net 1040 ml       Physical Exam:  Incision: : well approximated; packing intact to the right foot; No drainage, no pus, no purulence on compression  Wound left foot sub 5th metatarsal head - no drainage, no pus, no purulence  Abdomen: soft, nontender, nondistended, no masses or organomegaly  DVT Exam:No evidence of DVT seen on physical exam.    EXTENDED exam: CRT to distal digits is WNL    Labs:  Lab Results   Component Value Date/Time    WBC 4.2 06/05/2018 03:04 AM    HGB 9.9 (L) 06/05/2018 03:04 AM    HCT 30.7 (L) 06/05/2018 03:04 AM    MCV 96.8 06/05/2018 03:04 AM    PLATELET 171 06/05/2018 03:04 AM     Lab Results   Component Value Date/Time    Sodium 141 06/05/2018 03:04 AM    Potassium 3.9 06/05/2018 03:04 AM    Chloride 113 (H) 06/05/2018 03:04 AM    CO2 21 06/05/2018 03:04 AM    Phosphorus 3.6 06/05/2018 03:04 AM    BUN 6 06/05/2018 03:04 AM    Calcium 7.9 (L) 06/05/2018 03:04 AM      Lab Results   Component Value Date/Time    Glucose 74 06/05/2018 03:04 AM       Impression/Plan:   Principal Problem:    Right foot infection (06/02/2018)    Active Problems:    Essential  hypertension, benign (02/21/2015)      Overview: Overview:       Qualifier: Diagnosis of       By: Claiborne Billings CMA, Jacqualynn       HLD (hyperlipidemia) (02/21/2015)      Coronary atherosclerosis of native coronary artery (02/21/2015)      Overview: S/p CABG x5. DOE was presenting complaint.      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  S/P right foot I&D and left foot bone biopsy x2  Plan:  ??? Patient seen and evaluated.  ??? Dressings changed to bilateral feet.  ??? Awaiting pathology results from OR.  ??? Patient is  considering second opinion pending results of the bone biopsy.  ??? Continue IV antibiotics  ??? PWBAT to the right heel and WBAT to the LEFT foot  ??? Elevate b/l lower extremities at rest  ??? Following.    Teyla Skidgel A. Sherryll Burger, DPM  [phone] (o)  June 05, 2018  6:18 AM

## 2018-06-05 NOTE — Progress Notes (Signed)
Patient seen by OT. He is up in chair. He has an old post op shoe for left but currently nothing for R. He is PWBing on heel for R and WBAT for left. He will also need RW. He is able to tailor sit for LB dressing. Will need to address foot wear and obtain walker to further assess. Plan to discuss with evaluating PT.

## 2018-06-05 NOTE — Progress Notes (Signed)
Progress note:    1)Tried to reach out to patient's wife to update on current plan. No answer.   Will try to call later.   Merylin [phone]    2)Patient informed nursing he wants him home medications to be sent to [facility]. Likely will get printed scripts.     3)Spoke to Dr Sinclair Ship office assistant Harriet Pho. Patient follows with Dr Ria Comment since 2018. Was not able to talk to provider, who is in OR at this time. Left a call back number for FMS team, and left Dr Margaretmary Eddy name for assistant to relate to Dr Ria Comment.     Release of medical records form ordered.     [name], MD

## 2018-06-05 NOTE — Progress Notes (Signed)
CM Note:  Met with pt for d/c planning.  Pt stated he was independent with ADL's, was driving and was using a cane at the time of the infection.  Otherwise, he walked unaided.  [facility] also [facility]  Medications from [id number] in [place], [place] at [place].  Reason for Admission:   Cellulitis , R foot infection                  RUR Score:     13             PCP: First and Last name:  Elijah [name]   Name of Practice: [facility]   Are you a current patient: Yes/No:   Yes   Approximate date of last visit: 06/02/18    Do you (patient/family) have any concerns for transition/discharge?   no                Plan for utilizing home health:   hh recommended, will need an agency in [place]    Current Advanced Directive/Advance Care Plan:  No; his wife is his next of kin            Transition of Care Plan:       1. IV abx--for home  2. Will need a PICC  3  Podiatry following  4. Wound care consult  5. PT/OT consult  6.  Home health recommended  7. May need a RW  8 pt to arrange transport to [place]  L.Neely, RN    Care Management Interventions  PCP Verified by CM: Yes  Physical Therapy Consult: Yes  Occupational Therapy Consult: Yes  Current Support Network: Lives with Spouse, Own Home(4 story house in [place] with 14 entry stairs and 13 interior stairs.)  Confirm Follow Up Transport: Other (see comment)  Discharge Location  Discharge Placement: Home

## 2018-06-05 NOTE — Progress Notes (Signed)
Progress Notes by [name], MD at 06/05/18 [phone]                Author: [name], MD  Service: FAMILY MEDICINE  Author Type: Resident       Filed: 06/05/18 0932  Date of Service: 06/05/18 0610  Status: Attested Addendum          Editor: [name], MD (Resident)       Related Notes: Original Note by [name], MD (Resident) filed at 06/05/18 [phone]          Cosigner: Frankey Shown, MD at 06/05/18 [phone]          Attestation signed by Frankey Shown, MD at 06/05/18 8119          [facility] Attending Addendum:       I was NOT present with the resident during the interview & examination of the patient. I personally interviewed the patient & repeated the critical or key portions of the exam.  I agree with the resident's note with the following additions:       68 y.o. male who  has a past medical history of BPH (benign prostatic hyperplasia), CAD (coronary artery disease), History of vascular access device (09/08/2016), HLD (hyperlipidemia), HTN (hypertension), Morbid obesity due to excess calories (HCC)  (04/24/2015), OSA (obstructive sleep apnea), Polyneuropathy, and Tubular adenoma of colon. admitted for RLE cellulitis and bilateral foot ulcers.        Patient Vitals for the past 4 hrs:     Temp  Pulse  Resp  BP  SpO2      06/05/18 0709  97.5 ??F (36.4 ??C)  (!) 54  18  174/80  99 %      06/05/18 0700  --  (!) 56  --  --  --         Pt reclined in bed, appears comfortable   Pleasant and appropriate affect, A&Ox3        1.  Bilateral foot ulcers, left foot osteomyelitis: Podiatry following, s/p I&D and debridement in OR on 3/8. Continue vancomycin and zosyn for now pending final cultures. Pt  desires second opinion re: antibiotics and tx from his long term podiatrist in [place], [place]       See resident note for detailed assessment and plan.       Frankey Shown, MD    Pt seen and examined on 06/05/2018                                            [address]   [place], [place] [address]    Office [phone]   Fax [phone]                Assessment and Plan        DECKARD STUBER is a 68 y.o. male with a medical hx of polyneuropathy, OSAS (CPAP), HTN HLD, CAD with CABG x5 in 2011, Obesity and s/p Gastric Sleeve in 01/2018??admitted for R foot wound. He  has spent 3 night(s) in the hospital      24 Hour Events: No acute events.      Left foot osteomyelitis, abscess of right foot. S/p I&D of R foot and incision of L 5th digit and metatarsal  - Wcx prelim moderate staph, GBS, and light diptheriods. Appreciate podiatry  recs.    - Vanc and Zosyn for broad spectrum coverage    -f/u blood and final wound cx    -daily CBC/CMP/mag/phos??   - wound care following   - consult to ID for abx management when get wound cx back       Hypotension in the setting of HTN: BP in PCP office today 107/66 and Recent 70 Lb weight loss from gastric bypass. POA BP ??118/66. ??Iron  profile, TSH and B12 from 3/2 wnl. Orthostatics negative Echo nl cavity size. LVEF 55-60%   - decreased IVF to 1/2 maintenance rate , will stop when PO intake is sufficient    -will restart home Bystolic  daily as BP's trend, currently brady    -daily CBC/CMP/mag/phos??   ??   S/p Gastric Sleeve: 70 lb wt loss since 11/19    -continue home B complex    -continue home B12??   ??   Hyperlipidemia:??Last lipid panel on??1/20??and values were TC- 118, TG- 119, ??HDL-41, LDL-53,    -Continue home??Lipitor  daily    ??   Polyneuropathy: chronic bilateral below the knee    -continue home Lyrica  bid    -continue home Cymbalta  once daily??   ??   Sleep Apnea:    -continue CPAP qhs??   ??   Obesity??BMI Body mass index is 38.1 kg/m??.   - Encouraging lifestyle modifications and further follow up outpatient.          FEN/GI - Regular diet. 75 ml/h   Activity - Ambulate with assistance   DVT prophylaxis - Sub Q Heparin   GI prophylaxis - Not indicated at this time   Disposition - Plan  to d/c to TBD. PT/OT following    Code Status - Full      I appreciate the opportunity to participate in the care of this patient,   [name], MD   [facility] Resident              Subjective / Objective        Subjective: Patient does not have any complaints. Patient inquires how long he will have to be on abx. He has trip planned in the end of the month.       Dr Sherryll Burger at bedside and advising patient to cancel the trip d/t surgery and further treatment.       Temp (24hrs), Avg:98 ??F (36.7 ??C), Min:97.5 ??F (36.4 ??C), Max:98.5 ??F (36.9 ??C)       Physical Exam   Constitutional :        General: He is not in acute distress.     Appearance: He is not ill-appearing.   Cardiovascular:       Rate and Rhythm: Normal rate and regular rhythm.      Heart sounds: No murmur.    Pulmonary:       Effort: Pulmonary effort is normal.      Breath sounds: No wheezing.   Musculoskeletal:          General: No swelling.      Comments: Wounds are covered by dressing           Respiratory:   O2 Device: Room air       I/O:       Date  06/04/18 0700 - 06/05/18 0659  06/05/18 0700 - 06/06/18 0659             Shift  0700-1859  1900-0659  24 Hour Total  5638-9373  1900-0659  24 Hour Total       INTAKE             P.O.  240  600  840                     P.O.  240  600  840                   I.V.(mL/kg/hr)  1250(0.8)    1250(0.4)                     I.V.  750    750                     Volume (lactated Ringers infusion)  500    500                   Shift Total(mL/kg)  1490(11.4)  600(4.6)  4287(68)             OUTPUT             Urine(mL/kg/hr)  800(0.5)  550(0.3)  1350(0.4)                     Urine Voided  800  550  1350                   Shift Total(mL/kg)  800(6.1)  550(4.2)  1350(10.3)                   NET  690  50  740                   Weight (kg)  131  131  131  131  131  131           Inpatient Medications     Current Facility-Administered Medications          Medication  Dose  Route  Frequency           ?  vancomycin  (VANCOCIN) 1,250 mg in 0.9% sodium chloride (MBP/ADV) 250 mL   1,250 mg  IntraVENous  Q12H     ?  ferrous sulfate tablet 325 mg   1 Tab  Oral  BID WITH MEALS     ?  heparin (porcine) injection 5,000 Units   5,000 Units  SubCUTAneous  Q8H     ?  sodium chloride (NS) flush 5-40 mL   5-40 mL  IntraVENous  Q8H     ?  sodium chloride (NS) flush 5-40 mL   5-40 mL  IntraVENous  PRN     ?  0.9% sodium chloride infusion   75 mL/hr  IntraVENous  CONTINUOUS     ?  acetaminophen (TYLENOL) tablet 650 mg   650 mg  Oral  Q4H PRN     ?  piperacillin-tazobactam (ZOSYN) 3.375 g in 0.9% sodium chloride (MBP/ADV) 100 mL   3.375 g  IntraVENous  Q8H     ?  atorvastatin (LIPITOR) tablet 80 mg   80 mg  Oral  DAILY     ?  b-complex with vitamin c tablet 1 Tab   1 Tab  Oral  DAILY     ?  cyanocobalamin (VITAMIN B12) tablet 500 mcg   500 mcg  Oral  DAILY     ?  DULoxetine (CYMBALTA) capsule 60 mg   60 mg  Oral  DAILY           ?  pregabalin (LYRICA) capsule 300 mg   300 mg  Oral  BID        Allergies     Allergies        Allergen  Reactions         ?  Metoprolol  Other (comments)             Patient states, made me feel horrible.        CBC:     Recent Labs             06/05/18   0304  06/04/18   0054  06/03/18   0543     WBC  4.2  5.6  4.3     HGB  9.9*  10.4*  10.7*     HCT  30.7*  31.7*  32.5*          PLT  171  166  150        Metabolic Panel:     Recent Labs              06/05/18   0304  06/04/18   0054  06/03/18   0543  06/10/2018   1739     NA  141  142  144  139     K  3.9  3.6  3.6  3.8     CL  113*  114*  115*  109*     CO2  21  22  24  26      BUN  6  8  13  15      CREA  0.88  0.96  0.88  1.10     GLU  74  118*  73  113*     CA  7.9*  7.8*  8.0*  8.7     MG  2.2  1.9  2.3   --      PHOS  3.6  2.4*  3.9   --      ALB   --    --    --   3.1*     SGOT   --    --    --   23           ALT   --    --    --   23                   For Billing          Chief Complaint      Patient presents with      ?  Fatigue      ?  Foot Problem                Hospital Problems   Date Reviewed:  Jun 10, 2018                 Codes  Class  Noted  POA        Abscess of right foot  ICD-10-CM: L02.611   ICD-9-CM: 682.7    06/04/2018  Unknown                Acute osteomyelitis of left foot (HCC)  ICD-10-CM: M86.172   ICD-9-CM: 730.07    06/04/2018  Unknown                Cellulitis  ICD-10-CM: L03.90   ICD-9-CM: 682.9    June 10, 2018  Unknown                * (  Principal) Right foot infection  ICD-10-CM: L08.9   ICD-9-CM: 686.9    06/02/2018  Unknown                Polyneuropathy (Chronic)  ICD-10-CM: G62.9   ICD-9-CM: 356.9    05/28/2016  Yes                Obesity  ICD-10-CM: E66.9   ICD-9-CM: 278.00    04/24/2015  Unknown                Essential hypertension, benign (Chronic)  ICD-10-CM: I10   ICD-9-CM: 401.1    02/21/2015  Yes        Overview Signed 08/10/2016  3:46 PM by Jimmey RalphGagat, Cindy W, LPN          Overview:    Qualifier: Diagnosis of    By: Claiborne Billingsallahan CMA, Jacqualynn                          HLD (hyperlipidemia) (Chronic)  ICD-10-CM: E78.5   ICD-9-CM: 272.4    02/21/2015  Yes                Coronary atherosclerosis of native coronary artery (Chronic)  ICD-10-CM: I25.10   ICD-9-CM: 414.01    02/21/2015  Yes        Overview Addendum 08/10/2016  3:46 PM by Jimmey RalphGagat, Cindy W, LPN          S/p CABG x5. DOE was presenting complaint.                         OSA on CPAP (Chronic)  ICD-10-CM: U98.11G47.33, Z99.89   ICD-9-CM: 327.23, V46.8    02/21/2015  Yes                S/P CABG (coronary artery bypass graft)  ICD-10-CM: Z95.1   ICD-9-CM: V45.81    08/23/2013  Yes        Overview Signed 08/10/2016  3:46 PM by Jimmey RalphGagat, Cindy W, LPN          Overview:    2009

## 2018-06-05 NOTE — Progress Notes (Signed)
Bedside shift change report given to Aundra Millet, Rn (oncoming nurse) by Campbell Lerner, RN (offgoing nurse). Report included the following information SBAR and Kardex.

## 2018-06-06 LAB — CBC
Hematocrit: 31.9 % — ABNORMAL LOW (ref 36.6–50.3)
Hemoglobin: 10.6 g/dL — ABNORMAL LOW (ref 12.1–17.0)
MCH: 31 PG (ref 26.0–34.0)
MCHC: 33.2 g/dL (ref 30.0–36.5)
MCV: 93.3 FL (ref 80.0–99.0)
MPV: 11.2 FL (ref 8.9–12.9)
NRBC Absolute: 0 10*3/uL (ref 0.00–0.01)
Nucleated RBCs: 0 PER 100 WBC
Platelets: 207 10*3/uL (ref 150–400)
RBC: 3.42 M/uL — ABNORMAL LOW (ref 4.10–5.70)
RDW: 15.1 % — ABNORMAL HIGH (ref 11.5–14.5)
WBC: 4.3 10*3/uL (ref 4.1–11.1)

## 2018-06-06 LAB — CULTURE, WOUND W GRAM STAIN
GRAM STAIN: NONE SEEN
GRAM STAIN: NONE SEEN
Gram Stain Result: NONE SEEN
Gram Stain Result: NONE SEEN

## 2018-06-06 LAB — BASIC METABOLIC PANEL
Anion Gap: 7 mmol/L (ref 5–15)
BUN: 6 MG/DL (ref 6–20)
Bun/Cre Ratio: 6 — ABNORMAL LOW (ref 12–20)
CO2: 24 mmol/L (ref 21–32)
Calcium: 8.3 MG/DL — ABNORMAL LOW (ref 8.5–10.1)
Chloride: 111 mmol/L — ABNORMAL HIGH (ref 97–108)
Creatinine: 0.96 MG/DL (ref 0.70–1.30)
EGFR IF NonAfrican American: >60 mL/min/{1.73_m2} (ref >60)
GFR African American: >60 mL/min/{1.73_m2} (ref >60)
Glucose: 78 mg/dL (ref 65–100)
Potassium: 3.6 mmol/L (ref 3.5–5.1)
Sodium: 142 mmol/L (ref 136–145)

## 2018-06-06 LAB — MAGNESIUM
Magnesium: 2.2 mg/dL (ref 1.6–2.4)
Magnesium: 2.2 mg/dL (ref 1.6–2.4)

## 2018-06-06 LAB — CULTURE, ANAEROBIC

## 2018-06-06 LAB — PHOSPHORUS
Phosphorus: 3.3 MG/DL (ref 2.6–4.7)
Phosphorus: 3.3 MG/DL (ref 2.6–4.7)

## 2018-06-06 LAB — CBC W/O DIFF
ABSOLUTE NRBC: 0 10*3/uL (ref 0.00–0.01)
HCT: 31.9 % — ABNORMAL LOW (ref 36.6–50.3)
HGB: 10.6 g/dL — ABNORMAL LOW (ref 12.1–17.0)
MCH: 31 PG (ref 26.0–34.0)
MCHC: 33.2 g/dL (ref 30.0–36.5)
MCV: 93.3 FL (ref 80.0–99.0)
MPV: 11.2 FL (ref 8.9–12.9)
NRBC: 0 PER 100 WBC
PLATELET: 207 10*3/uL (ref 150–400)
RBC: 3.42 M/uL — ABNORMAL LOW (ref 4.10–5.70)
RDW: 15.1 % — ABNORMAL HIGH (ref 11.5–14.5)
WBC: 4.3 10*3/uL (ref 4.1–11.1)

## 2018-06-06 LAB — METABOLIC PANEL, BASIC
Anion gap: 7 mmol/L (ref 5–15)
BUN/Creatinine ratio: 6 — ABNORMAL LOW (ref 12–20)
BUN: 6 MG/DL (ref 6–20)
CO2: 24 mmol/L (ref 21–32)
Calcium: 8.3 MG/DL — ABNORMAL LOW (ref 8.5–10.1)
Chloride: 111 mmol/L — ABNORMAL HIGH (ref 97–108)
Creatinine: 0.96 MG/DL (ref 0.70–1.30)
GFR est AA: >60 mL/min/{1.73_m2} (ref >60)
GFR est non-AA: >60 mL/min/{1.73_m2} (ref >60)
Glucose: 78 mg/dL (ref 65–100)
Potassium: 3.6 mmol/L (ref 3.5–5.1)
Sodium: 142 mmol/L (ref 136–145)

## 2018-06-06 MED ORDER — FAMOTIDINE 20 MG TAB
20 mg | Freq: Two times a day (BID) | ORAL | Status: DC
Start: 2018-06-06 — End: 2018-06-08
  Administered 2018-06-06 – 2018-06-08 (×4): via ORAL

## 2018-06-06 MED ORDER — PHARMACY VANCOMYCIN NOTE
Freq: Once | Status: AC
Start: 2018-06-06 — End: 2018-06-06
  Administered 2018-06-06: 23:00:00

## 2018-06-06 MED ORDER — ATORVASTATIN 20 MG TAB
20 mg | Freq: Every evening | ORAL | Status: DC
Start: 2018-06-06 — End: 2018-06-08
  Administered 2018-06-07 – 2018-06-08 (×2): via ORAL

## 2018-06-06 MED ORDER — NEBIVOLOL 5 MG TAB
5 mg | Freq: Every day | ORAL | Status: DC
Start: 2018-06-06 — End: 2018-06-08
  Administered 2018-06-06 – 2018-06-08 (×3): via ORAL

## 2018-06-06 MED FILL — FAMOTIDINE 20 MG TAB: 20 mg | ORAL | Qty: 1

## 2018-06-06 MED FILL — BYSTOLIC 5 MG TABLET: 5 mg | ORAL | Qty: 2

## 2018-06-06 MED FILL — VANCOMYCIN 1.25 GRAM IV SOLUTION: 1.25 gram | INTRAVENOUS | Qty: 1250

## 2018-06-06 MED FILL — PIPERACILLIN-TAZOBACTAM 3.375 GRAM IV SOLR: 3.375 gram | INTRAVENOUS | Qty: 3.38

## 2018-06-06 MED FILL — FERROUS SULFATE 325 MG (65 MG ELEMENTAL IRON) TAB: 325 mg (65 mg iron) | ORAL | Qty: 1

## 2018-06-06 MED FILL — CYANOCOBALAMIN 500 MCG TAB: 500 mcg | ORAL | Qty: 1

## 2018-06-06 MED FILL — DULOXETINE 30 MG CAP, DELAYED RELEASE: 30 mg | ORAL | Qty: 2

## 2018-06-06 MED FILL — PREGABALIN 75 MG CAP: 75 mg | ORAL | Qty: 4

## 2018-06-06 MED FILL — ATORVASTATIN 20 MG TAB: 20 mg | ORAL | Qty: 4

## 2018-06-06 MED FILL — PHARMACY VANCOMYCIN NOTE: Qty: 1

## 2018-06-06 MED FILL — HEPARIN (PORCINE) 5,000 UNIT/ML IJ SOLN: 5000 unit/mL | INTRAMUSCULAR | Qty: 1

## 2018-06-06 MED FILL — TOTAL B/C TABLET: ORAL | Qty: 1

## 2018-06-06 NOTE — Progress Notes (Signed)
Problem: Mobility Impaired (Adult and Pediatric)  Goal: *Acute Goals and Plan of Care (Insert Text)  Description  FUNCTIONAL STATUS PRIOR TO ADMISSION: Patient was independent and active without use of DME.    HOME SUPPORT PRIOR TO ADMISSION: The patient lived with wife.    Physical Therapy Goals  Initiated 06/05/2018  1.  Patient will transfer from bed to chair and chair to bed with modified independence using the least restrictive device within 7 day(s).  2.  Patient will perform sit to stand with modified independence within 7 day(s).  3.  Patient will ambulate with modified independence for 200 feet maintaining heel weight bearing on the right with the least restrictive device within 7 day(s).   4.  Patient will ascend/descend 12 stairs with one handrail(s) with minimal assistance/contact guard assist within 7 day(s).   Outcome: Progressing Towards Goal   PHYSICAL THERAPY TREATMENT  Patient: Elijah [name] (68 y.o. male)  Date: 06/06/2018  Diagnosis: Cellulitis [L03.90]  Right foot infection [L08.9]   Right foot infection  Procedure(s) (LRB):  LEFT FOOT 5th METATARSAL RESECTION AND RIGHT FOOT I&D (Bilateral) 2 Days Post-Op  Precautions:    Chart, physical therapy assessment, plan of care and goals were reviewed.    ASSESSMENT  Patient continues with skilled PT services and is progressing towards goals. Mobilizing at an overall SBA level with assist only needed to don bilateral off loading shoes. Pt ambulated 184ft without LOB, weakness or DOE. Required verbal cueing to avoid weight bearing through R forefoot with fatigue. Returned to supine at end of session. Will need RW for home use.     Current Level of Function Impacting Discharge (mobility/balance): SBA    Other factors to consider for discharge: None         PLAN :  Patient continues to benefit from skilled intervention to address the above impairments.  Continue treatment per established plan of care.  to address goals.     Recommendation for discharge: (in order for the patient to meet his/her long term goals)  No skilled physical therapy/ follow up rehabilitation needs identified at this time.    This discharge recommendation:  Has been made in collaboration with the attending provider and/or case management    IF patient discharges home will need the following DME: rolling walker       SUBJECTIVE:   Patient stated ???What are your thoughts on Trump?.???    OBJECTIVE DATA SUMMARY:   Critical Behavior:              Functional Mobility Training:  Bed Mobility:        Sit to Supine: Independent           Transfers:  Sit to Stand: Stand-by assistance  Stand to Sit: Stand-by assistance                             Balance:  Sitting: Intact  Standing: Intact;With support  Ambulation/Gait Training:  Distance (ft): 180 Feet (ft)  Assistive Device: Walker, rolling;Gait belt  Ambulation - Level of Assistance: Stand-by assistance        Gait Abnormalities: Decreased step clearance        Base of Support: Widened        Step Length: Right shortened;Left shortened                  Activity Tolerance:   Good  Please refer to the flowsheet for  vital signs taken during this treatment.    After treatment patient left in no apparent distress:   Supine in bed, Heels elevated for pressure relief, and Call bell within reach    COMMUNICATION/COLLABORATION:   The patient???s plan of care was discussed with: Registered nurse.     Rhea Pink, PT   Time Calculation: 36 mins

## 2018-06-06 NOTE — Consults (Signed)
[place] [facility]  CONSULTATION    Name:  TORRON, FEHRMAN  MR#:  [id number]  DOB:  [birth year]  ACCOUNT #:  [id number]  DATE OF SERVICE:  06/06/2018    CHIEF COMPLAINT:  Right foot pain.    HISTORY OF PRESENT ILLNESS:  The patient is a 68 year old male with past medical history significant for obstructive sleep apnea, hypertension, coronary artery disease and obesity who was admitted to [facility] on 03/06 with the aforementioned complaint.  The patient has a history of idiopathic peripheral neuropathy.  He was seen approximately 2 years ago with a foot infection with osteomyelitis, requiring 6-week course of intravenous antibiotics.  He has been followed since that time at a wound healing center in [place].  He has had a chronic wound on the left foot that he was told was stable.  Approximately 2 weeks ago, he developed a "cyst" on the right foot which ruptured and began to drain bloody material.  Workup at this admission has revealed osteomyelitis involving the left foot and an abscess with cellulitis on the right foot.  He has been seen by Podiatry and underwent incision and drainage of the right foot abscess and biopsy of left foot on 03/08.  Cultures and pathology are pending.  He is currently on piperacillin-tazobactam and vancomycin.  The Infectious Diseases Service has been asked to assist with antibiotic management.    PAST MEDICAL HISTORY:  Benign prostatic hypertrophy, coronary artery disease, dyslipidemia, morbid obesity, obstructive sleep apnea, idiopathic peripheral neuropathy, tubular adenoma.    PAST SURGICAL HISTORY:  Hip replacement.    FAMILY HISTORY:  CVA.    SOCIAL HISTORY:  No alcohol, tobacco or illicit drug use.    ALLERGIES:  NO ANTIBIOTIC ALLERGIES.    REVIEW OF SYSTEMS:  As per the HPI.  Remainder 10-system review of systems is unremarkable.    LABORATORY DATA:  White blood cell count 4300, platelet count 207,000.   Creatinine is 0.96, C-reactive protein is 5.4.  Cultures from the right foot abscess are growing methicillin-sensitive Staphylococcus aureus and group B streptococcus.  Cultures from the bone biopsy reveal no growth to date.  MRI of the left foot reveals probable fifth MTP joint septic arthritis with osteomyelitis of the fifth metatarsal head and likely fifth proximal phalanx.    PHYSICAL EXAMINATION:  VITAL SIGNS:  Temperature is 98.4, maximum temperature is less than 100, heart rate 65 beats per minute, blood pressure 144/79, respiratory rate 17, oxygen saturation 97% on room air.  GENERAL:  Alert, in no acute distress.  HEENT:  Normocephalic, atraumatic.  Mucous membranes moist.  NECK:  Supple.  HEART:  Regular rate and rhythm.  No murmurs, gallops, rubs.  PULMONARY:  Clear to auscultation bilaterally.  No rales, rhonchi or wheezes.  ABDOMEN:  Soft, obese, nontender.  EXTREMITIES:  No clubbing, cyanosis or edema.  Both feet are bandaged postoperatively.  SKIN:  No rashes.  PSYCHIATRIC:  Normal affect.  NEUROLOGIC:  Cranial nerves II-XII grossly intact.  No focal deficits.    ASSESSMENT AND PLAN:  1.  Diabetic foot infection with likely osteomyelitis and septic arthritis involving the left foot.  The patient is status post incision and drainage with bone biopsy.  Cultures reveal no growth to date.  Pathology is pending.  I anticipate the patient will need a PICC line and 6-week course of IV antibiotics at the time of discharge.  This was discussed at length with the patient at bedside.  We will  make further recommendations pending final culture results and pathology results.  2.  Right foot abscess.  The patient is status post incision and drainage on 03/08.  Cultures are growing group B streptococcus and methicillin-sensitive Staphylococcus aureus.  No osteomyelitis was noted on MRI.  Continue antibiotics as above.  3.  Morbid obesity.  The patient is status post gastric sleeve surgery.     Thank you for allowing me to participate in the care of this patient.      Alechia Lezama A Kodie Kishi, DO      [id number]  D:  06/06/2018 12:14  T:  06/06/2018 16:22  JOB #:  [id number]

## 2018-06-06 NOTE — Progress Notes (Addendum)
[address]  [place], [place] [address]   Office [phone]  Fax [phone]          Assessment and Plan     Elijah [name] is a 68 y.o. male with a medical hx of polyneuropathy, OSAS (CPAP), HTN HLD, CAD with CABG x5 in 2011, Obesity and s/p Gastric Sleeve in 01/2018??admitted for R foot wound.     24 Hour Events: No acute events.    Left foot osteomyelitis, abscess of right foot. S/p I&D of R foot and incision of L 5th digit and metatarsal - Wcx from surgery prelim moderate staph, GBS, and light diptheriods.  WCx from 3/6(final) staph aureus. Appreciate podiatry recs.   - Vanc and Zosyn for broad spectrum coverage   -f/u blood and final wound cx   -daily CBC/CMP/mag/phos??  - wound care following  - consult to ID for possible OP IV abx management - likely pt will go home to [place], [place] after discharge  - awaiting records from Dr Ria Comment in [place] and [facility]    Hypotension in the setting of HTN: Hx of CABG 2011. Recent 70 Lb weight loss from gastric bypass. POA BP ??118/66. ??Iron profile, TSH and B12 from 3/2 wnl. Orthostatics negative Echo nl cavity size. LVEF 55-60%   -will restart home Bystolic  daily   -daily CBC/CMP/mag/phos??  ??  S/p Gastric Sleeve: 70 lb wt loss since 11/19   -continue home B complex   -continue home B12??  ??  Hyperlipidemia:??Last lipid panel on??1/20??and values were TC- 118, TG- 119, ??HDL-41, LDL-53,   -Continue home??Lipitor  daily   ??  Polyneuropathy: chronic bilateral below the knee   -continue home Lyrica  bid   -continue home Cymbalta  once daily??  ??  Sleep Apnea:   -continue CPAP qhs??  ??  Obesity??BMI Body mass index is 38.1 kg/m??.  - Encouraging lifestyle modifications and further follow up outpatient.     FEN/GI - Regular diet.   Activity - Ambulate with assistance  DVT prophylaxis - Sub Q Heparin  GI prophylaxis - Not indicated at this time  Disposition - Plan to d/c to [facility]. PT/OT following   Code Status - Full     I appreciate the opportunity to participate in the care of this patient,  [name], MD  [facility] Medicine Resident         Subjective / Objective     Subjective: Patient does not have any complaints.    Temp (24hrs), Avg:98.6 ??F (37 ??C), Min:97.5 ??F (36.4 ??C), Max:99.3 ??F (37.4 ??C)     Physical Exam  Constitutional:       General: He is not in acute distress.     Appearance: He is not ill-appearing.   Cardiovascular:      Rate and Rhythm: Normal rate and regular rhythm.      Heart sounds: No murmur.   Pulmonary:      Effort: Pulmonary effort is normal.      Breath sounds: No wheezing.   Musculoskeletal:         General: No swelling.      Comments: Wounds are covered by dressing        Respiratory: O2 Flow Rate (L/min): 3 l/min O2 Device: Room air     I/O:  Date 06/05/18 0700 - 06/06/18 0659 06/06/18 0700 - 06/07/18 0659   Shift 0700-1859 1900-0659 24 Hour Total 0700-1859 1900-0659 24 Hour Total   INTAKE   P.O. 500 1200  1700        P.O. 500 1200 1700      Shift Total(mL/kg) 500(3.8) 1200(9.2) 1700(13)      OUTPUT   Urine(mL/kg/hr) 650(0.4) 2550 3200        Urine Voided 650 2550 3200      Shift Total(mL/kg) 650(5) 2550(19.5) 3200(24.4)      NET -150 -1350 -1500      Weight (kg) 131 131 131 131 131 131       Inpatient Medications  Current Facility-Administered Medications   Medication Dose Route Frequency   ??? vancomycin (VANCOCIN) 1,250 mg in 0.9% sodium chloride (MBP/ADV) 250 mL  1,250 mg IntraVENous Q12H   ??? ferrous sulfate tablet 325 mg  1 Tab Oral BID WITH MEALS   ??? heparin (porcine) injection 5,000 Units  5,000 Units SubCUTAneous Q8H   ??? sodium chloride (NS) flush 5-40 mL  5-40 mL IntraVENous Q8H   ??? sodium chloride (NS) flush 5-40 mL  5-40 mL IntraVENous PRN   ??? acetaminophen (TYLENOL) tablet 650 mg  650 mg Oral Q4H PRN   ??? piperacillin-tazobactam (ZOSYN) 3.375 g in 0.9% sodium chloride (MBP/ADV) 100 mL  3.375 g IntraVENous Q8H   ??? atorvastatin (LIPITOR) tablet 80 mg  80 mg Oral DAILY    ??? b-complex with vitamin c tablet 1 Tab  1 Tab Oral DAILY   ??? cyanocobalamin (VITAMIN B12) tablet 500 mcg  500 mcg Oral DAILY   ??? DULoxetine (CYMBALTA) capsule 60 mg  60 mg Oral DAILY   ??? pregabalin (LYRICA) capsule 300 mg  300 mg Oral BID     Allergies  Allergies   Allergen Reactions   ??? Metoprolol Other (comments)     Patient states, made me feel horrible.     CBC:  Recent Labs     06/06/18  0532 06/05/18  0304 06/04/18  0054   WBC 4.3 4.2 5.6   HGB 10.6* 9.9* 10.4*   HCT 31.9* 30.7* 31.7*   PLT 207 171 166     Metabolic Panel:  Recent Labs     06/06/18  0532 06/05/18  0304 06/04/18  0054   NA 142 141 142   K 3.6 3.9 3.6   CL 111* 113* 114*   CO2 24 21 22    BUN 6 6 8    CREA 0.96 0.88 0.96   GLU 78 74 118*   CA 8.3* 7.9* 7.8*   MG 2.2 2.2 1.9   PHOS 3.3 3.6 2.4*          For Billing    Chief Complaint   Patient presents with   ??? Fatigue   ??? Foot Problem       Hospital Problems  Date Reviewed: 2018/06/23          Codes Class Noted POA    Abscess of right foot ICD-10-CM: L02.611  ICD-9-CM: 682.7  06/04/2018 Unknown        Acute osteomyelitis of left foot (HCC) ICD-10-CM: M86.172  ICD-9-CM: 730.07  06/04/2018 Unknown        Cellulitis ICD-10-CM: L03.90  ICD-9-CM: 682.9  June 23, 2018 Unknown        * (Principal) Right foot infection ICD-10-CM: L08.9  ICD-9-CM: 686.9  June 23, 2018 Unknown        Polyneuropathy (Chronic) ICD-10-CM: G62.9  ICD-9-CM: 356.9  05/28/2016 Yes        Obesity ICD-10-CM: Z96.7  ICD-9-CM: 278.00  04/24/2015 Unknown        Essential hypertension, benign (Chronic) ICD-10-CM: I10  ICD-9-CM: 401.1  02/21/2015 Yes    Overview Signed 08/10/2016  3:46 PM by Jimmey Ralph, LPN     Overview:   Qualifier: Diagnosis of   By: Claiborne Billings CMA, Jacqualynn              HLD (hyperlipidemia) (Chronic) ICD-10-CM: E78.5  ICD-9-CM: 272.4  02/21/2015 Yes        Coronary atherosclerosis of native coronary artery (Chronic) ICD-10-CM: I25.10  ICD-9-CM: 414.01  02/21/2015 Yes    Overview Addendum 08/10/2016  3:46 PM by Jimmey Ralph, LPN      S/p CABG x5. DOE was presenting complaint.             OSA on CPAP (Chronic) ICD-10-CM: H54.56, Z99.89  ICD-9-CM: 327.23, V46.8  02/21/2015 Yes        S/P CABG (coronary artery bypass graft) ICD-10-CM: Z95.1  ICD-9-CM: V45.81  08/23/2013 Yes    Overview Signed 08/10/2016  3:46 PM by Jimmey Ralph, LPN     Overview:   2009

## 2018-06-06 NOTE — Progress Notes (Signed)
Spiritual Care Partner Volunteer visited patient in 4 Post Surg/Ortho on 06/06/18.  Documented by: Chaplain Martha J. Pittenger MDiv., MS., BCC  Chaplain Paging Service [phone]

## 2018-06-06 NOTE — Progress Notes (Addendum)
CM Note:  RUR: 13-medium  Transition of Care Plan:       1. IV abx--for home--waiting on ID eval/recs and path results  2. Will need a PICC  3  Podiatry following  4. Needs home health PT and nursing, wound care  5. Order for RW noted.  A referral was sent in [facility] to [facility]  6. Pt to arrange transport to [place]  L.Neely, RN

## 2018-06-06 NOTE — Progress Notes (Signed)
Bedside shift change report given to Janine (oncoming nurse) by Demetria (offgoing nurse). Report included the following information SBAR, Kardex, MAR and Recent Results.

## 2018-06-06 NOTE — Progress Notes (Signed)
Bedside shift change report given to Demetria, RN (oncoming nurse) by Janine, RN (offgoing nurse). Report included the following information SBAR and Kardex.

## 2018-06-06 NOTE — Progress Notes (Signed)
Problem: Falls - Risk of  Goal: *Absence of Falls  Description  Document Schmid Fall Risk and appropriate interventions in the flowsheet.  Outcome: Progressing Towards Goal  Note: Fall Risk Interventions:  Mobility Interventions: Communicate number of staff needed for ambulation/transfer, Patient to call before getting OOB         Medication Interventions: Bed/chair exit alarm, Patient to call before getting OOB, Teach patient to arise slowly         History of Falls Interventions: Bed/chair exit alarm, Door open when patient unattended, Investigate reason for fall

## 2018-06-06 NOTE — Progress Notes (Signed)
[facility] Dosing Services: Vancomycin trough evaluation 06/06/18     Consult for antibiotic dosing of vancomycin by Dr. Griffin  Indication: Left foot osteomyelitis, abscess of right foot  Day of Therapy: 5    Vancomycin trough: 21.7mcg/mL drawn early extrapolates to 20.74mcg/mL.  Level is slightly supratherapeutic. Change to 1gm ivpb q12h for a goal trough of 15-20mcg/mL  Pharmacy to follow daily.  Thank you,    Denise C. Wardlaw, Pharm D.        Contact: [phone]  Date Dose & Interval Measured (mcg/mL) Extrapolated (mcg/mL)   ?3/8 at 1154 ?2000 mg IV q12h ?18.8 ?19.8   ?3/10 @1902hrs ?1250mg ivpb q12h ?21.7 ?20.74   ?3/11 ?1gm ivpb q12h ? ?

## 2018-06-06 NOTE — Progress Notes (Signed)
[facility]   Podiatric Surgery  Post-Op Note    Subjective:   Admit Date: 06/02/2018  OR Date: 06/04/2018  Post-op: 2 Days Post-Op  Status Post: Incision and drainage, right foot, multiple fascial spaces, Incision of bone cortex, left fifth digit proximal phalanx, Incision of bone cortex, left fifth metatarsal.    Elijah [name]  appears; alert, well appearing, and in no distress and oriented to person, place, and time  Pain control is: excellent    Objective::    height is 6\' 1"  (1.854 m) and weight is 131 kg (288 lb 12.8 oz). His temperature is 98.5 ??F (36.9 ??C). His blood pressure is 156/83 and his pulse is 58 (abnormal). His respiration is 17 and oxygen saturation is 95%.     Intake/Output Summary (Last 24 hours) at 06/06/2018 0619  Last data filed at 06/06/2018 0411  Gross per 24 hour   Intake 1700 ml   Output 3500 ml   Net -1800 ml       Physical Exam:  Incision: : well approximated; packing intact to the right foot; No drainage, no pus, no purulence on compression  Wound left foot sub 5th metatarsal head - no drainage, no pus, no purulence  Abdomen: soft, nontender, nondistended, no masses or organomegaly  DVT Exam:No evidence of DVT seen on physical exam.    EXTENDED exam: CRT to distal digits is WNL    Labs:  Lab Results   Component Value Date/Time    WBC 4.3 06/06/2018 05:32 AM    HGB 10.6 (L) 06/06/2018 05:32 AM    HCT 31.9 (L) 06/06/2018 05:32 AM    MCV 93.3 06/06/2018 05:32 AM    PLATELET 207 06/06/2018 05:32 AM     Lab Results   Component Value Date/Time    Sodium 142 06/06/2018 05:32 AM    Potassium 3.6 06/06/2018 05:32 AM    Chloride 111 (H) 06/06/2018 05:32 AM    CO2 24 06/06/2018 05:32 AM    Phosphorus 3.3 06/06/2018 05:32 AM    BUN 6 06/06/2018 05:32 AM    Calcium 8.3 (L) 06/06/2018 05:32 AM      Lab Results   Component Value Date/Time    Glucose 78 06/06/2018 05:32 AM       Impression/Plan:   Principal Problem:    Right foot infection (06/02/2018)    Active Problems:     Essential hypertension, benign (02/21/2015)      Overview: Overview:       Qualifier: Diagnosis of       By: [name] CMA, Elijah       HLD (hyperlipidemia) (02/21/2015)      Coronary atherosclerosis of native coronary artery (02/21/2015)      Overview: S/p CABG x5. DOE was presenting complaint.      OSA on CPAP (02/21/2015)      Obesity (04/24/2015)      Polyneuropathy (05/28/2016)      S/P CABG (coronary artery bypass graft) (08/23/2013)      Overview: Overview:       2009      Cellulitis (06/02/2018)      Abscess of right foot (06/04/2018)      Acute osteomyelitis of left foot (HCC) (06/04/2018)      S/P right foot I&D and left foot bone biopsy x2    Plan:  ? Patient seen and evaluated.  ? Dressings changed to bilateral feet.  ? Awaiting pathology results from OR.  ? Continue IV antibiotics -  will likely need Infectious disease evaluation/recommendations  ? Patient leaning toward IV antibiotics if LEFT foot bone biopsy is positive for osteomyelitis.   ? He will need home health for IV antibiotics and dressing changes when he returns to [place] from here.   ? PWBAT to the right heel and WBAT to the LEFT foot  ? Elevate b/l lower extremities at rest  ? Following.     Yuvia Plant A. Sherryll Burger, DPM  [phone] (o)  June 06, 2018  6:18 AM

## 2018-06-06 NOTE — Progress Notes (Signed)
Progress Notes by [name], MD at 06/06/18 [phone]                Author: [name], MD  Service: FAMILY MEDICINE  Author Type: Resident       Filed: 06/06/18 1248  Date of Service: 06/06/18 0551  Status: Attested Addendum          Editor: [name], MD (Resident)       Related Notes: Original Note by [name], MD (Resident) filed at 06/06/18 [phone]          Cosigner: Frankey Shown, MD at 06/06/18 1805          Attestation signed by Frankey Shown, MD at 06/06/18 1805          [facility] Attending Addendum:       I was NOT present with the resident during the interview & examination of the patient. I personally interviewed the patient & repeated the critical or key portions of the exam.  I agree with the resident's note with the following additions:       68 y.o. male who  has a past medical history of BPH (benign prostatic hyperplasia), CAD (coronary artery disease), History of vascular access device (09/08/2016), HLD (hyperlipidemia), HTN (hypertension), Morbid obesity due to excess calories (HCC)  (04/24/2015), OSA (obstructive sleep apnea), Polyneuropathy, and Tubular adenoma of colon. admitted for RLE cellulitis and bilateral foot ulcers.        Patient Vitals for the past 4 hrs:     Temp  Pulse  Resp  BP  SpO2      06/06/18 1603  97.7 ??F (36.5 ??C)  61  18  136/66  95 %      06/06/18 1500  --  (!) 57  --  --  --         Pt in the restroom with aid of nurse       1.  Bilateral foot ulcers, left foot osteomyelitis: Podiatry following, s/p I&D of R foot and debridement of left foot 5th digit and metatarsal on 3/8. Continue vancomycin and  zosyn for now pending final cultures. ID consulted, apprec assistance with recs/orders for outpatient IV abx. Working on sharing/receiving records from prior podiatrist Dr. Ria Comment from [place]   2.  HTN/CAD: BP was low in office and on admission, now normo to hypertensive, so  will resume nebivolol  daily       See resident note for detailed assessment and plan.       Frankey Shown, MD    Pt seen and examined on 06/06/2018                                              [address]   [place], [place] [address]    Office [phone]   Fax [phone]                Assessment and Plan        Elijah [name] is a 68 y.o. male with a medical hx of polyneuropathy, OSAS (CPAP), HTN HLD, CAD with CABG x5 in 2011, Obesity and s/p Gastric Sleeve in 01/2018??admitted for R foot wound.       24 Hour Events: No acute events.      Left foot osteomyelitis, abscess of right foot. S/p  I&D of R foot and incision of L 5th digit and metatarsal  - Wcx from surgery prelim moderate staph, GBS, and light diptheriods.  WCx from 3/6(final) staph aureus. Appreciate podiatry recs.    - Vanc and Zosyn for broad spectrum coverage    -f/u blood and final wound cx    -daily CBC/CMP/mag/phos??   - wound care following   - consult to ID for possible OP IV abx management - likely pt will go home to [place], [place] after discharge   - awaiting records from Dr Ria Comment in [place] and [facility]      Hypotension in the setting of HTN: Hx of CABG 2011. Recent 70 Lb weight loss from gastric bypass. POA BP ??118/66. ??Iron profile,  TSH and B12 from 3/2 wnl. Orthostatics negative Echo nl cavity size. LVEF 55-60%    -will restart home Bystolic 10mg  daily    -daily CBC/CMP/mag/phos??   ??   S/p Gastric Sleeve: 70 lb wt loss since 11/19    -continue home B complex    -continue home B12??   ??   Hyperlipidemia:??Last lipid panel on??1/20??and values were TC- 118, TG- 119, ??HDL-41, LDL-53,    -Continue home??Lipitor 40mg  daily    ??   Polyneuropathy: chronic bilateral below the knee    -continue home Lyrica 300mg  bid    -continue home Cymbalta 60mg  once daily??   ??   Sleep Apnea:    -continue CPAP qhs??   ??   Obesity??BMI Body mass index is 38.1 kg/m??.   - Encouraging lifestyle modifications and further follow up outpatient.        FEN/GI - Regular diet.    Activity - Ambulate with assistance   DVT prophylaxis - Sub Q Heparin   GI prophylaxis - Not indicated at this time   Disposition - Plan to d/c to [facility]. PT/OT following    Code Status - Full      I appreciate the opportunity to participate in the care of this patient,   [name], MD   [facility] Medicine Resident              Subjective / Objective        Subjective: Patient does not have any complaints.      Temp (24hrs), Avg:98.6 ??F (37 ??C), Min:97.5 ??F (36.4 ??C), Max:99.3 ??F (37.4 ??C)       Physical Exam   Constitutional :        General: He is not in acute distress.     Appearance: He is not ill-appearing.   Cardiovascular:       Rate and Rhythm: Normal rate and regular rhythm.      Heart sounds: No murmur.    Pulmonary:       Effort: Pulmonary effort is normal.      Breath sounds: No wheezing.   Musculoskeletal:          General: No swelling.      Comments: Wounds are covered by dressing           Respiratory: O2 Flow Rate (L/min): 3 l/min O2 Device: Room air       I/O:       Date  06/05/18 0700 - 06/06/18 0659  06/06/18 0700 - 06/07/18 0659             Shift  0700-1859  1900-0659  24 Hour Total  0700-1859  1900-0659  24 Hour Total       INTAKE  P.O.  500  1200  1700                     P.O.  500  1200  1700                   Shift Total(mL/kg)  500(3.8)  1200(9.2)  1700(13)             OUTPUT             Urine(mL/kg/hr)  650(0.4)  2550  3200                     Urine Voided  650  2550  3200                   Shift Total(mL/kg)  650(5)  2550(19.5)  3200(24.4)                   NET  -150  -1350  -1500                   Weight (kg)  131  131  131  131  131  131           Inpatient Medications     Current Facility-Administered Medications          Medication  Dose  Route  Frequency           ?  vancomycin (VANCOCIN) 1,250 mg in 0.9% sodium chloride (MBP/ADV) 250 mL   1,250 mg  IntraVENous  Q12H     ?  ferrous sulfate tablet 325 mg   1 Tab  Oral  BID WITH MEALS     ?   heparin (porcine) injection 5,000 Units   5,000 Units  SubCUTAneous  Q8H     ?  sodium chloride (NS) flush 5-40 mL   5-40 mL  IntraVENous  Q8H     ?  sodium chloride (NS) flush 5-40 mL   5-40 mL  IntraVENous  PRN     ?  acetaminophen (TYLENOL) tablet 650 mg   650 mg  Oral  Q4H PRN     ?  piperacillin-tazobactam (ZOSYN) 3.375 g in 0.9% sodium chloride (MBP/ADV) 100 mL   3.375 g  IntraVENous  Q8H     ?  atorvastatin (LIPITOR) tablet 80 mg   80 mg  Oral  DAILY     ?  b-complex with vitamin c tablet 1 Tab   1 Tab  Oral  DAILY     ?  cyanocobalamin (VITAMIN B12) tablet 500 mcg   500 mcg  Oral  DAILY     ?  DULoxetine (CYMBALTA) capsule 60 mg   60 mg  Oral  DAILY           ?  pregabalin (LYRICA) capsule 300 mg   300 mg  Oral  BID        Allergies     Allergies        Allergen  Reactions         ?  Metoprolol  Other (comments)             Patient states, made me feel horrible.        CBC:     Recent Labs             06/06/18   0532  06/05/18   0304  06/04/18   0054     WBC  4.3  4.2  5.6     HGB  10.6*  9.9*  10.4*     HCT  31.9*  30.7*  31.7*          PLT  207  171  166        Metabolic Panel:     Recent Labs             06/06/18   0532  06/05/18   0304  06/04/18   0054     NA  142  141  142     K  3.6  3.9  3.6     CL  111*  113*  114*     CO2  BUN  CREA  0.96  0.88  0.96     GLU  78  74  118*     CA  8.3*  7.9*  7.8*     MG  2.2  2.2  1.9          PHOS  3.3  3.6  2.4*                   For Billing          Chief Complaint      Patient presents with      ?  Fatigue      ?  Foot Problem               Hospital Problems   Date Reviewed:  06/15/2018                 Codes  Class  Noted  POA        Abscess of right foot  ICD-10-CM: L02.611   ICD-9-CM: 682.7    06/04/2018  Unknown                Acute osteomyelitis of left foot (HCC)  ICD-10-CM: M86.172   ICD-9-CM: 730.07    06/04/2018  Unknown                Cellulitis  ICD-10-CM: L03.90   ICD-9-CM: 682.9    06/15/2018  Unknown                * (Principal)  Right foot infection  ICD-10-CM: L08.9   ICD-9-CM: 686.9    2018-06-15  Unknown                Polyneuropathy (Chronic)  ICD-10-CM: G62.9   ICD-9-CM: 356.9    05/28/2016  Yes                Obesity  ICD-10-CM: E66.9   ICD-9-CM: 278.00    04/24/2015  Unknown                Essential hypertension, benign (Chronic)  ICD-10-CM: I10   ICD-9-CM: 401.1    02/21/2015  Yes        Overview Signed 08/10/2016  3:46 PM by Jimmey Ralph, LPN          Overview:    Qualifier: Diagnosis of    By: Claiborne Billings CMA, Jacqualynn                          HLD (hyperlipidemia) (Chronic)  ICD-10-CM: E78.5   ICD-9-CM: 272.4    02/21/2015  Yes  Coronary atherosclerosis of native coronary artery (Chronic)  ICD-10-CM: I25.10   ICD-9-CM: 414.01    02/21/2015  Yes        Overview Addendum 08/10/2016  3:46 PM by Jimmey Ralph, LPN          S/p CABG x5. DOE was presenting complaint.                         OSA on CPAP (Chronic)  ICD-10-CM: Z16.96, Z99.89   ICD-9-CM: 327.23, V46.8    02/21/2015  Yes                S/P CABG (coronary artery bypass graft)  ICD-10-CM: Z95.1   ICD-9-CM: V45.81    08/23/2013  Yes        Overview Signed 08/10/2016  3:46 PM by Jimmey Ralph, LPN          Overview:    2009

## 2018-06-06 NOTE — Progress Notes (Signed)
[facility] Dosing Services: Vancomycin trough evaluation 06/06/18     Consult for antibiotic dosing of vancomycin by Dr. Valentina Lucks  Indication: Left foot osteomyelitis, abscess of right foot  Day of Therapy: 5    Vancomycin trough: 21.74mcg/mL drawn early extrapolates to 20.30mcg/mL.  Level is slightly supratherapeutic. Change to 1gm ivpb q12h for a goal trough of 15-7mcg/mL  Pharmacy to follow daily.  Thank you,    Rico Sheehan. [place], Pharm D.        Contact: [phone]  Date Dose & Interval Measured (mcg/mL) Extrapolated (mcg/mL)   ?3/8 at 1154 ?2000 mg IV q12h ?18.8 ?19.8   ?3/10 @1902hrs  ?1250mg  ivpb q12h ?21.7 ?20.74   ?3/11 ?1gm ivpb q12h ? ?

## 2018-06-06 NOTE — Progress Notes (Signed)
CM Note:  RUR: 13-medium  Transition of Care Plan:       1. IV abx--for home--waiting on ID eval/recs and path results  2. Will need a PICC  3  Podiatry following  4. Needs home health PT and nursing, wound care  5. Order for RW noted.  A referral was sent in [facility] to [facility]  6. Pt to arrange transport to [place]  L.Dorothyann Gibbs, RN

## 2018-06-06 NOTE — Progress Notes (Signed)
Problem: Falls - Risk of  Goal: *Absence of Falls  Description  Document Elijah [name] Fall Risk and appropriate interventions in the flowsheet.  Outcome: Progressing Towards Goal  Note: Fall Risk Interventions:  Mobility Interventions: Communicate number of staff needed for ambulation/transfer, Patient to call before getting OOB         Medication Interventions: Bed/chair exit alarm, Patient to call before getting OOB, Teach patient to arise slowly         History of Falls Interventions: Bed/chair exit alarm, Door open when patient unattended, Investigate reason for fall

## 2018-06-06 NOTE — Progress Notes (Signed)
[facility]   Podiatric Surgery  Post-Op Note    Subjective:   Admit Date: 06/02/2018  OR Date: 06/04/2018  Post-op: 2 Days Post-Op  Status Post: Incision and drainage, right foot, multiple fascial spaces, Incision of bone cortex, left fifth digit proximal phalanx, Incision of bone cortex, left fifth metatarsal.    [name]  appears; alert, well appearing, and in no distress and oriented to person, place, and time  Pain control is: excellent    Objective::    height is 6\' 1"  (1.854 m) and weight is 131 kg (288 lb 12.8 oz). His temperature is 98.5 ??F (36.9 ??C). His blood pressure is 156/83 and his pulse is 58 (abnormal). His respiration is 17 and oxygen saturation is 95%.     Intake/Output Summary (Last 24 hours) at 06/06/2018 0619  Last data filed at 06/06/2018 0411  Gross per 24 hour   Intake 1700 ml   Output 3500 ml   Net -1800 ml       Physical Exam:  Incision: : well approximated; packing intact to the right foot; No drainage, no pus, no purulence on compression  Wound left foot sub 5th metatarsal head - no drainage, no pus, no purulence  Abdomen: soft, nontender, nondistended, no masses or organomegaly  DVT Exam:No evidence of DVT seen on physical exam.    EXTENDED exam: CRT to distal digits is WNL    Labs:  Lab Results   Component Value Date/Time    WBC 4.3 06/06/2018 05:32 AM    HGB 10.6 (L) 06/06/2018 05:32 AM    HCT 31.9 (L) 06/06/2018 05:32 AM    MCV 93.3 06/06/2018 05:32 AM    PLATELET 207 06/06/2018 05:32 AM     Lab Results   Component Value Date/Time    Sodium 142 06/06/2018 05:32 AM    Potassium 3.6 06/06/2018 05:32 AM    Chloride 111 (H) 06/06/2018 05:32 AM    CO2 24 06/06/2018 05:32 AM    Phosphorus 3.3 06/06/2018 05:32 AM    BUN 6 06/06/2018 05:32 AM    Calcium 8.3 (L) 06/06/2018 05:32 AM      Lab Results   Component Value Date/Time    Glucose 78 06/06/2018 05:32 AM       Impression/Plan:   Principal Problem:    Right foot infection (06/02/2018)    Active Problems:    Essential  hypertension, benign (02/21/2015)      Overview: Overview:       Qualifier: Diagnosis of       By: Claiborne Billings CMA, Jacqualynn       HLD (hyperlipidemia) (02/21/2015)      Coronary atherosclerosis of native coronary artery (02/21/2015)      Overview: S/p CABG x5. DOE was presenting complaint.      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 Patient seen and evaluated.  ??? Dressings changed to bilateral feet.  ??? Awaiting pathology results from OR.  ??? Continue IV antibiotics -  will likely need Infectious disease evaluation/recommendations  ??? Patient leaning toward IV antibiotics if LEFT foot bone biopsy is positive for osteomyelitis.   ??? He will need home health for IV antibiotics and dressing changes when he returns to [place] from here.   ??? PWBAT to the right heel and WBAT to the LEFT foot  ??? Elevate b/l lower extremities at rest  ??? Following.     Jamine Highfill A. Sherryll Burger, DPM  [phone] (o)  June 06, 2018  6:18 AM

## 2018-06-06 NOTE — Progress Notes (Signed)
 Problem: Mobility Impaired (Adult and Pediatric)  Goal: *Acute Goals and Plan of Care (Insert Text)  Description  FUNCTIONAL STATUS PRIOR TO ADMISSION: Patient was independent and active without use of DME.    HOME SUPPORT PRIOR TO ADMISSION: The patient lived with wife.    Physical Therapy Goals  Initiated 06/05/2018  1.  Patient will transfer from bed to chair and chair to bed with modified independence using the least restrictive device within 7 day(s).  2.  Patient will perform sit to stand with modified independence within 7 day(s).  3.  Patient will ambulate with modified independence for 200 feet maintaining heel weight bearing on the right with the least restrictive device within 7 day(s).   4.  Patient will ascend/descend 12 stairs with one handrail(s) with minimal assistance/contact guard assist within 7 day(s).   Outcome: Progressing Towards Goal   PHYSICAL THERAPY TREATMENT  Patient: Elijah [name] (68 y.o. male)  Date: 06/06/2018  Diagnosis: Cellulitis [L03.90]  Right foot infection [L08.9]   Right foot infection  Procedure(s) (LRB):  LEFT FOOT 5th METATARSAL RESECTION AND RIGHT FOOT I&D (Bilateral) 2 Days Post-Op  Precautions:    Chart, physical therapy assessment, plan of care and goals were reviewed.    ASSESSMENT  Patient continues with skilled PT services and is progressing towards goals. Mobilizing at an overall SBA level with assist only needed to don bilateral off loading shoes. Pt ambulated 123ft without LOB, weakness or DOE. Required verbal cueing to avoid weight bearing through R forefoot with fatigue. Returned to supine at end of session. Will need RW for home use.     Current Level of Function Impacting Discharge (mobility/balance): SBA    Other factors to consider for discharge: None         PLAN :  Patient continues to benefit from skilled intervention to address the above impairments.  Continue treatment per established plan of care.  to address goals.    Recommendation for discharge:  (in order for the patient to meet his/her long term goals)  No skilled physical therapy/ follow up rehabilitation needs identified at this time.    This discharge recommendation:  Has been made in collaboration with the attending provider and/or case management    IF patient discharges home will need the following DME: rolling walker       SUBJECTIVE:   Patient stated "What are your thoughts on Trump?."    OBJECTIVE DATA SUMMARY:   Critical Behavior:              Functional Mobility Training:  Bed Mobility:        Sit to Supine: Independent           Transfers:  Sit to Stand: Stand-by assistance  Stand to Sit: Stand-by assistance                             Balance:  Sitting: Intact  Standing: Intact;With support  Ambulation/Gait Training:  Distance (ft): 180 Feet (ft)  Assistive Device: Walker, rolling;Gait belt  Ambulation - Level of Assistance: Stand-by assistance        Gait Abnormalities: Decreased step clearance        Base of Support: Widened        Step Length: Right shortened;Left shortened                  Activity Tolerance:   Good  Please refer to the flowsheet for  vital signs taken during this treatment.    After treatment patient left in no apparent distress:   Supine in bed, Heels elevated for pressure relief, and Call bell within reach    COMMUNICATION/COLLABORATION:   The patient's plan of care was discussed with: Registered nurse.     Pleasant FORBES Rao, PT   Time Calculation: 36 mins

## 2018-06-07 ENCOUNTER — Inpatient Hospital Stay: Admit: 2018-06-07 | Payer: MEDICARE | Primary: Family Medicine

## 2018-06-07 ENCOUNTER — Encounter: Attending: Specialist | Primary: Family Medicine

## 2018-06-07 LAB — VANCOMYCIN TROUGH: Vancomycin Tr: 21.7 ug/mL (ref 5.0–10.0)

## 2018-06-07 LAB — BASIC METABOLIC PANEL
Anion Gap: 6 mmol/L (ref 5–15)
BUN: 7 MG/DL (ref 6–20)
Bun/Cre Ratio: 8 — ABNORMAL LOW (ref 12–20)
CO2: 25 mmol/L (ref 21–32)
Calcium: 8.2 MG/DL — ABNORMAL LOW (ref 8.5–10.1)
Chloride: 112 mmol/L — ABNORMAL HIGH (ref 97–108)
Creatinine: 0.9 MG/DL (ref 0.70–1.30)
EGFR IF NonAfrican American: >60 mL/min/{1.73_m2} (ref >60)
GFR African American: >60 mL/min/{1.73_m2} (ref >60)
Glucose: 79 mg/dL (ref 65–100)
Potassium: 3.5 mmol/L (ref 3.5–5.1)
Sodium: 143 mmol/L (ref 136–145)

## 2018-06-07 LAB — CBC
Hematocrit: 33.7 % — ABNORMAL LOW (ref 36.6–50.3)
Hemoglobin: 11.1 g/dL — ABNORMAL LOW (ref 12.1–17.0)
MCH: 30.8 PG (ref 26.0–34.0)
MCHC: 32.9 g/dL (ref 30.0–36.5)
MCV: 93.6 FL (ref 80.0–99.0)
MPV: 10.7 FL (ref 8.9–12.9)
NRBC Absolute: 0 10*3/uL (ref 0.00–0.01)
Nucleated RBCs: 0 PER 100 WBC
Platelets: 223 10*3/uL (ref 150–400)
RBC: 3.6 M/uL — ABNORMAL LOW (ref 4.10–5.70)
RDW: 14.8 % — ABNORMAL HIGH (ref 11.5–14.5)
WBC: 4.7 10*3/uL (ref 4.1–11.1)

## 2018-06-07 LAB — MAGNESIUM
Magnesium: 2.2 mg/dL (ref 1.6–2.4)
Magnesium: 2.2 mg/dL (ref 1.6–2.4)

## 2018-06-07 LAB — CULTURE, BLOOD, PAIRED
Culture result:: NO GROWTH
Culture: NO GROWTH

## 2018-06-07 LAB — PHOSPHORUS
Phosphorus: 3.8 MG/DL (ref 2.6–4.7)
Phosphorus: 3.8 MG/DL (ref 2.6–4.7)

## 2018-06-07 LAB — METABOLIC PANEL, BASIC
Anion gap: 6 mmol/L (ref 5–15)
BUN/Creatinine ratio: 8 — ABNORMAL LOW (ref 12–20)
BUN: 7 MG/DL (ref 6–20)
CO2: 25 mmol/L (ref 21–32)
Calcium: 8.2 MG/DL — ABNORMAL LOW (ref 8.5–10.1)
Chloride: 112 mmol/L — ABNORMAL HIGH (ref 97–108)
Creatinine: 0.9 MG/DL (ref 0.70–1.30)
GFR est AA: >60 mL/min/{1.73_m2} (ref >60)
GFR est non-AA: >60 mL/min/{1.73_m2} (ref >60)
Glucose: 79 mg/dL (ref 65–100)
Potassium: 3.5 mmol/L (ref 3.5–5.1)
Sodium: 143 mmol/L (ref 136–145)

## 2018-06-07 LAB — CBC W/O DIFF
ABSOLUTE NRBC: 0 10*3/uL (ref 0.00–0.01)
HCT: 33.7 % — ABNORMAL LOW (ref 36.6–50.3)
HGB: 11.1 g/dL — ABNORMAL LOW (ref 12.1–17.0)
MCH: 30.8 PG (ref 26.0–34.0)
MCHC: 32.9 g/dL (ref 30.0–36.5)
MCV: 93.6 FL (ref 80.0–99.0)
MPV: 10.7 FL (ref 8.9–12.9)
NRBC: 0 PER 100 WBC
PLATELET: 223 10*3/uL (ref 150–400)
RBC: 3.6 M/uL — ABNORMAL LOW (ref 4.10–5.70)
RDW: 14.8 % — ABNORMAL HIGH (ref 11.5–14.5)
WBC: 4.7 10*3/uL (ref 4.1–11.1)

## 2018-06-07 LAB — VANCOMYCIN, TROUGH: Vancomycin,trough: 21.7 ug/mL — CR (ref 5.0–10.0)

## 2018-06-07 MED ORDER — SODIUM CHLORIDE 0.9 % IV
10 gram | INTRAVENOUS | Status: DC
Start: 2018-06-07 — End: 2018-06-08
  Administered 2018-06-07: 16:00:00 via INTRAVENOUS

## 2018-06-07 MED ORDER — ALTEPLASE 2 MG SOLUTION FOR INJECTION
2 mg | Status: DC | PRN
Start: 2018-06-07 — End: 2018-06-08

## 2018-06-07 MED ORDER — LISINOPRIL 10 MG TAB
10 mg | ORAL_TABLET | Freq: Every day | ORAL | 0 refills | Status: DC
Start: 2018-06-07 — End: 2018-06-08

## 2018-06-07 MED ORDER — LISINOPRIL 5 MG TAB
5 mg | Freq: Every day | ORAL | Status: DC
Start: 2018-06-07 — End: 2018-06-08
  Administered 2018-06-08: 12:00:00 via ORAL

## 2018-06-07 MED ORDER — FERROUS SULFATE 325 MG (65 MG ELEMENTAL IRON) TAB
325 mg (65 mg iron) | ORAL_TABLET | Freq: Two times a day (BID) | ORAL | 0 refills | Status: DC
Start: 2018-06-07 — End: 2018-06-08

## 2018-06-07 MED ORDER — SODIUM CHLORIDE 0.9 % IV PIGGY BACK
1000 mg | Freq: Two times a day (BID) | INTRAVENOUS | Status: DC
Start: 2018-06-07 — End: 2018-06-07

## 2018-06-07 MED ORDER — ARTIFICIAL TEARS (DEXTRAN 70-HYPROMELLOSE) 0.1 %-0.3 % EYE DROPS
OPHTHALMIC | Status: DC | PRN
Start: 2018-06-07 — End: 2018-06-08
  Administered 2018-06-07 (×2): via OPHTHALMIC

## 2018-06-07 MED FILL — DULOXETINE 30 MG CAP, DELAYED RELEASE: 30 mg | ORAL | Qty: 2

## 2018-06-07 MED FILL — HEPARIN (PORCINE) 5,000 UNIT/ML IJ SOLN: 5000 unit/mL | INTRAMUSCULAR | Qty: 1

## 2018-06-07 MED FILL — GENTEAL TEARS MILD 0.1 %-0.3 % EYE DROPS: OPHTHALMIC | Qty: 30

## 2018-06-07 MED FILL — PIPERACILLIN-TAZOBACTAM 3.375 GRAM IV SOLR: 3.375 gram | INTRAVENOUS | Qty: 3.38

## 2018-06-07 MED FILL — TOTAL B/C TABLET: ORAL | Qty: 1

## 2018-06-07 MED FILL — BYSTOLIC 5 MG TABLET: 5 mg | ORAL | Qty: 2

## 2018-06-07 MED FILL — ATORVASTATIN 20 MG TAB: 20 mg | ORAL | Qty: 4

## 2018-06-07 MED FILL — FAMOTIDINE 20 MG TAB: 20 mg | ORAL | Qty: 1

## 2018-06-07 MED FILL — PREGABALIN 75 MG CAP: 75 mg | ORAL | Qty: 4

## 2018-06-07 MED FILL — FERROUS SULFATE 325 MG (65 MG ELEMENTAL IRON) TAB: 325 mg (65 mg iron) | ORAL | Qty: 1

## 2018-06-07 MED FILL — VANCOMYCIN 10 GRAM IV SOLR: 10 gram | INTRAVENOUS | Qty: 2500

## 2018-06-07 MED FILL — CYANOCOBALAMIN 500 MCG TAB: 500 mcg | ORAL | Qty: 1

## 2018-06-07 MED FILL — CATHFLO ACTIVASE 2 MG INTRA-CATHETER SOLUTION: 2 mg | Qty: 1

## 2018-06-07 MED FILL — VANCOMYCIN 1.25 GRAM IV SOLUTION: 1.25 gram | INTRAVENOUS | Qty: 1250

## 2018-06-07 NOTE — Progress Notes (Signed)
VAT note- To acknowledge order for PICC placement for long term antibiotics .  Chart reviewed for PICC placement evaluation. Will plan to place line this afternoon.

## 2018-06-07 NOTE — Consults (Addendum)
[facility] Progress Note           Ubaldo Glassing DO    [phone] Office  [phone]  Fax    06/07/2018      Assessment & Plan:   1.  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 The patient is status post incision and drainage with bone biopsy. Blood cultures reveal no growth to date. Personal review of MRI images with radiology shows undeniable signs of osteomyelitis despite negative pathology. Have discussed case with patient. Believe it would be prudent to give 6 weeks of IV Daptomycin and ceftriaxone. Will place order for PICC. Plan to be discussed with case management.  2.  Right foot abscess - The patient is status post incision and drainage on 03/08.  Cultures are growing group B streptococcus and methicillin-sensitive Staphylococcus aureus.  No osteomyelitis was noted on MRI.  Continue antibiotics as above.  3.  Morbid obesity - The patient is status post gastric sleeve surgery.          Subjective:     No complaints today. In no pain and tolerating diet.    Objective:     Vitals:   Visit Vitals  BP 169/89 (BP 1 Location: Left arm, BP Patient Position: At rest)   Pulse 61   Temp 97.8 ??F (36.6 ??C)   Resp 18   Ht 6\' 1"  (1.854 m)   Wt 288 lb 12.8 oz (131 kg)   SpO2 94%   BMI 38.10 kg/m??        Tmax:  Temp (24hrs), Avg:98 ??F (36.7 ??C), Min:97.7 ??F (36.5 ??C), Max:98.4 ??F (36.9 ??C)      Exam:   Patient is intubated:  no    Physical Examination:   General:  Alert, cooperative, no distress   Head:  Normocephalic, atraumatic.   Eyes:  Conjunctivae clear   Neck: Supple   Lungs:   No distress.  Speaking in complete sentences without labored breathing.   Chest wall:  No tenderness or deformity.   Heart:  Regular rate and rhythm, S1, S2 normal, no murmur   Abdomen:   Soft, non-tender, non-distended    Extremities: Moves all.  No cyanosis or edema. Feet bandaged bilaterally   Skin:  No rashes or lesions   Neurologic: CNII-XII intact. Normal strength     Labs:        No lab exists for component: ITNL   No results for input(s): CPK, CKMB, TROIQ in the last 72 hours.  Recent Labs     06/07/18  0359 06/06/18  0532 06/05/18  0304   NA 143 142 141   K 3.5 3.6 3.9   CL 112* 111* 113*   CO2 25 24 21    BUN 7 6 6    CREA 0.90 0.96 0.88   GLU 79 78 74   PHOS 3.8 3.3 3.6   MG 2.2 2.2 2.2   WBC 4.7 4.3 4.2   HGB 11.1* 10.6* 9.9*   HCT 33.7* 31.9* 30.7*   PLT 223 207 171     No results for input(s): INR, PTP, APTT, INREXT in the last 72 hours.  Needs: urine analysis, urine sodium, protein and creatinine  No results found for: NAU, CREAU      Cultures:     No results found for: SDES  Lab Results   Component  Value Date/Time    Culture result: LIGHT STAPHYLOCOCCUS AUREUS (A) 06/04/2018 10:30 AM    Culture result: (A) 06/04/2018 10:30 AM     LIGHT STREPTOCOCCI, BETA HEMOLYTIC GROUP B . Penicillin and ampicillin are drugs of choice for treatment of beta-hemolytic streptococcal infections. Susceptibility testing of penicillins and beta-lactams approved by the FDA for treatment of beta-hemolytic streptococcal infections need not be performed routinely, because nonsusceptible isolates are extremely rare. CLSI 2012    Culture result: NO ANAEROBES ISOLATED 06/04/2018 10:30 AM       Radiology:     Medications       Current Facility-Administered Medications   Medication Dose Route Frequency Last Dose   ??? artificial tears (dextran 70-hypromellose) (NATURAL BALANCE) 0.1-0.3 % ophthalmic solution 2 Drop  2 Drop Both Eyes PRN 2 Drop at 06/07/18 0536   ??? vancomycin (VANCOCIN) 2,500 mg in 0.9% sodium chloride 500 mL IVPB  2,500 mg IntraVENous Q24H     ??? atorvastatin (LIPITOR) tablet 80 mg  80 mg Oral QHS 80 mg at 06/06/18 2056   ??? nebivoloL (BYSTOLIC) tablet 10 mg  10 mg Oral DAILY 10 mg at 06/07/18 0813    ??? famotidine (PEPCID) tablet 20 mg  20 mg Oral BID 20 mg at 06/07/18 0092   ??? ferrous sulfate tablet 325 mg  1 Tab Oral BID WITH MEALS 325 mg at 06/07/18 0813   ??? heparin (porcine) injection 5,000 Units  5,000 Units SubCUTAneous Q8H 5,000 Units at 06/07/18 0814   ??? sodium chloride (NS) flush 5-40 mL  5-40 mL IntraVENous Q8H 10 mL at 06/07/18 0537   ??? sodium chloride (NS) flush 5-40 mL  5-40 mL IntraVENous PRN     ??? acetaminophen (TYLENOL) tablet 650 mg  650 mg Oral Q4H PRN     ??? piperacillin-tazobactam (ZOSYN) 3.375 g in 0.9% sodium chloride (MBP/ADV) 100 mL  3.375 g IntraVENous Q8H 3.375 g at 06/07/18 0536   ??? b-complex with vitamin c tablet 1 Tab  1 Tab Oral DAILY 1 Tab at 06/07/18 0813   ??? cyanocobalamin (VITAMIN B12) tablet 500 mcg  500 mcg Oral DAILY 500 mcg at 06/07/18 0814   ??? DULoxetine (CYMBALTA) capsule 60 mg  60 mg Oral DAILY 60 mg at 06/07/18 0813   ??? pregabalin (LYRICA) capsule 300 mg  300 mg Oral BID 300 mg at 06/07/18 0813           Case discussed with: Patient, primary team, will be discussed with case management      Ubaldo Glassing DO

## 2018-06-07 NOTE — Progress Notes (Signed)
[facility] Dosing Services: Antimicrobial Stewardship Daily Doc    Consult for antibiotic dosing of vancomycin by 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    Assessment/Plan:  SCr remains stable, no leukocytosis, no procalcitonin to evaluate, culture results still finalizing, afebrile in past 24 hours  Previous dose of 1250 mg (9.5 mg/kg) was not ideal and dose was changed to 1000 mg  (7.6 mg/kg) which is even less ideal. Recommended vancomycin dosing is between 10 and 20 mg/kg and generally try to extend interval if possible.  Will modify regimen to 2500 mg IV every 24 hours which predicts a trough of 15.2 mcg/mL (dose = 19.1 mg/kg, which is on higher end of acceptable range but q24h regimen would provide easier coordination in outpatient setting in anticipation of possible 6 week course). Will push first dose a few hours later to allow more clearance of previous dose.  BMP ordered daily by resident  Dose administration notes:   Doses given appropriately as scheduled    Date Dose & Interval Measured (mcg/mL) Extrapolated (mcg/mL)   ?3/8 at 1154 ?2000 mg IV q12h ?18.8 ?19.8   ?3/10 at 1902 ?1250 mg IV q12h ?21.7 ?20.7   ? ? ? ?       Other Antimicrobial   (not dosed by pharmacist) Zosyn 3.375 g IV every 8 hours extended infusion   Cultures 3/6 - Blood, paired - NGTD x 5 days - FINAL  3/6 - Wound - Moderate MSSA, moderate beta hemolytic strep - FINAL  3/7 - Tissue - Heavy beta hemolytic strep, rare GPC in clusters - FINAL  3/8 - Tissue and Anaerobe - NGTD x 2 days - Prelim   3/8 - Wound - Light staph aureus MSSA, light beta hemolytic strep - FINAL   Serum Creatinine Lab Results   Component Value Date/Time    Creatinine 0.90 06/07/2018 03:59 AM         Creatinine Clearance Estimated Creatinine Clearance: 109.9 mL/min (based on SCr of 0.9 mg/dL).     Temp Temp: 98.1 ??F (36.7 ??C)       WBC Lab Results   Component Value Date/Time    WBC 4.7 06/07/2018 03:59 AM        H/H Lab Results   Component Value Date/Time    HGB 11.1 (L) 06/07/2018 03:59 AM        Platelets    Lab Results   Component Value Date/Time    PLATELET 223 06/07/2018 03:59 AM          For Antifungals, Metronidazole, and Nafcillin:  ALT:        AST:      Alk Phos:      T Bili:    Pharmacist Darlen Round, PHARMD Contact information: [phone]

## 2018-06-07 NOTE — Progress Notes (Signed)
[facility]   Podiatric Surgery  Post-Op Note    Subjective:   Admit Date: 06/02/2018  OR Date: 06/04/2018  Post-op: 3 Days Post-Op  Status Post: Incision and drainage, right foot, multiple fascial spaces, Incision of bone cortex, left fifth digit proximal phalanx, Incision of bone cortex, left fifth metatarsal.    Elijah [name]  appears; alert, well appearing, and in no distress and oriented to person, place, and time  Pain control is: excellent    Objective::    height is 6\' 1"  (1.854 m) and weight is 131 kg (288 lb 12.8 oz). His temperature is 98.4 ??F (36.9 ??C). His blood pressure is 147/77 and his pulse is 63. His respiration is 18 and oxygen saturation is 96%.     Intake/Output Summary (Last 24 hours) at 06/07/2018 2007  Last data filed at 06/07/2018 1653  Gross per 24 hour   Intake 2900 ml   Output 4075 ml   Net -1175 ml       Physical Exam:  Incision: : well approximated; packing intact to the right foot; No drainage, no pus, no purulence on compression  Wound left foot sub 5th metatarsal head - no drainage, no pus, no purulence  Abdomen: soft, nontender, nondistended, no masses or organomegaly  DVT Exam:No evidence of DVT seen on physical exam.    EXTENDED exam: CRT to distal digits is WNL    Labs:  Lab Results   Component Value Date/Time    WBC 4.7 06/07/2018 03:59 AM    HGB 11.1 (L) 06/07/2018 03:59 AM    HCT 33.7 (L) 06/07/2018 03:59 AM    MCV 93.6 06/07/2018 03:59 AM    PLATELET 223 06/07/2018 03:59 AM     Lab Results   Component Value Date/Time    Sodium 143 06/07/2018 03:59 AM    Potassium 3.5 06/07/2018 03:59 AM    Chloride 112 (H) 06/07/2018 03:59 AM    CO2 25 06/07/2018 03:59 AM    Phosphorus 3.8 06/07/2018 03:59 AM    BUN 7 06/07/2018 03:59 AM    Calcium 8.2 (L) 06/07/2018 03:59 AM      Lab Results   Component Value Date/Time    Glucose 79 06/07/2018 03:59 AM       Impression/Plan:   Principal Problem:    Right foot infection (06/02/2018)    Active Problems:     Essential hypertension, benign (02/21/2015)      Overview: Overview:       Qualifier: Diagnosis of       By: Claiborne Billings CMA, Jacqualynn       HLD (hyperlipidemia) (02/21/2015)      Coronary atherosclerosis of native coronary artery (02/21/2015)      Overview: S/p CABG x5. DOE was presenting complaint.      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 Bone and soft tissue, left 5th toe, biopsy:   Benign bone and fibrovascular tissue   No evidence of acute osteomyelitis   2. Bone, left 5th metatarsal, biopsy:  Benign bone with hypocellular marrow   No evidence of acute osteomyelitis   *Electronically Signed Out By Venia Carbon, M.D.*       S/P right foot I&D and left foot bone biopsy x2    Plan:  ? Patient seen and evaluated.   ? Pathology results - negative for OM however, there is still a significant clinical suspicion for osteo and I agree with ID's recommendations of 6 weeks of IV antibiotics.   ? Incision sites and ulcer cleansed. Sutures from the LEFT foot were removed without incident.  ? Dressings re-applied to bilateral feet. A unna boot compressive wrap was applied to the RIGHT foot and leg as he will be traveling to [place] tomorrow and the change of his RLE swelling causing the plantar sutures to open is significant. This dressing should allow for the edema to be minimal. Patient understands he will need to elevate his lower extremities on the drive to prevent excessive edema. He also must limit his weightbearing status.  ? Home health for wound care and IV antibiotics.  ? I also recommended that he follow up with Dr. Ria Comment while in [place] within one week for further evaluation.    ? Patient was also given information for my office and will call when he returns to the [facility] area to schedule an appointment.  ? PWBAT to the right heel and WBAT to the LEFT foot  ? Continue to elevate b/l lower extremities at rest  ? Will follow while inpatient.  Call with questions    Marlyce Huge A. Sherryll Burger, DPM  [phone] (o)  June 07, 2018  8:06 PM

## 2018-06-07 NOTE — Progress Notes (Signed)
06-07-2018 CASE MANAGEMENT NOTE:  I have met with the patient several time to discuss discharge needs/plans. He stated he lives with his wife, Marilyn Gutzmer ([phone]), in a 4 story duplex in [place], [place]. He also lives part-time in his 2 story farmhouse at [address], [place] [address]. He has been independent with his ADL's, has a cane and CPaP and drives. Another CM sent a referral to [facility] for a rolling walker and they have informed me they are not in network with his insurance. The other DME companies do not deliver walkers to the hospital and the patient has asked his wife to purchase a RW for him. I was able to locate a home infusion company, [facility] in [place], [place] who can supply the home IVAB in [place]. I sent the referral to them thru [employer] and have spoken with [phone]-who informed me the patient will have the folowing co-pays:$1365 for supplies, $100/week for Daptomycin and $10/week for Ceftriaxone. The co-pays for the medications could change based on his insurance (ie-goes into the donut hole and then may have to pay the medications in full). I informed the patient of this and, although not pleased, is agreeable to paying the charges. He has had home IVAB several years ago and is trying to reach the home health RN as he would like to use the same company-cannot remember the name of the agency and left a message for the RN. I will pass this on in my hand-off to the CM for 06-08-2018. Sudy Adams, BSW, CM

## 2018-06-07 NOTE — Progress Notes (Addendum)
[address]  [place], [place] [address]   Office [phone]  Fax [phone]          Assessment and Plan     Elijah [name] is a 68 y.o. male with a medical hx of polyneuropathy, OSAS (CPAP), HTN HLD, CAD with CABG x5 in 2011, Obesity and s/p Gastric Sleeve in 01/2018??admitted for R foot wound.     24 Hour Events: No acute events.    Left foot osteomyelitis, abscess of right foot. S/p I&D of R foot and incision of L 5th digit and metatarsal. Appreciate podiatry and IDrecs.   - Vanc and Zosyn for broad spectrum coverage   - blood cx NG x5d and final bone tissue cx: NG x2d  - pathology report: benign bone w/o signs of osteomyelitis   - will discuss final pathology report with Dr Germaine Pomfret to decide if patient needs IV abx for discharge   - daily CBC/CMP/mag/phos??  - wound care following    Hypotension in the setting of HTN: Hx of CABG 2011. Recent 70 Lb weight loss from gastric bypass. POA BP ??118/66. ??Iron profile, TSH and B12 from 3/2 wnl. Orthostatics negative Echo nl cavity size. LVEF 55-60%   - Bystolic 10mg  daily   - daily CBC/CMP/mag/phos??  ??  S/p Gastric Sleeve: 70 lb wt loss since 11/19   -continue home B complex   -continue home B12??  - ferrous sulfate 325 mg     Heartburn: no Hx of GERD  - pepcid 20 daily  ??  Hyperlipidemia:??Last lipid panel on??1/20??and values were TC- 118, TG- 119, ??HDL-41, LDL-53,   -Continue home??Lipitor 40mg  daily   ??  Polyneuropathy: chronic bilateral below the knee   -continue home Lyrica 300mg  bid   -continue home Cymbalta 60mg  once daily??  ??  Sleep Apnea:   -continue CPAP qhs??  ??  Obesity??BMI Body mass index is 38.1 kg/m??.  - Encouraging lifestyle modifications and further follow up outpatient.     FEN/GI - Regular diet.   Activity - Ambulate with assistance  DVT prophylaxis - Sub Q Heparin  GI prophylaxis - Not indicated at this time  Disposition - Plan to d/c to TBD. PT/OT following   Code Status - Full     I appreciate the opportunity to participate in the care of this patient,  Elijah December, MD  [facility] Medicine Resident         Subjective / Objective     Subjective: Patient does not have any complaints. Wants to go home.    Temp (24hrs), Avg:98.1 ??F (36.7 ??C), Min:97.7 ??F (36.5 ??C), Max:98.4 ??F (36.9 ??C)     Physical Exam  Constitutional:       General: He is not in acute distress.     Appearance: He is not ill-appearing.   Cardiovascular:      Rate and Rhythm: Normal rate and regular rhythm.      Heart sounds: No murmur.   Pulmonary:      Effort: Pulmonary effort is normal.      Breath sounds: No wheezing.   Musculoskeletal:         General: No swelling.      Comments: Wounds are covered by dressing        Respiratory: O2 Flow Rate (L/min): 3 l/min O2 Device: Room air     I/O:  Date 06/06/18 0700 - 06/07/18 0659 06/07/18 0700 - 06/08/18 0659   Shift 0700-1859 1900-0659 24 Hour Total 0700-1859 1900-0659 24  Hour Total   INTAKE   P.O. 240 1240 1480        P.O. 240 1240 1480      Shift Total(mL/kg) 240(1.8) 1240(9.5) 1480(11.3)      OUTPUT   Urine(mL/kg/hr) 1775(1.1) 1475 3250        Urine Voided 1775 1475 3250      Shift Total(mL/kg) 1775(13.5) 1475(11.3) 3250(24.8)      NET -1535 -235 -1770      Weight (kg) 131 131 131 131 131 131       Inpatient Medications  Current Facility-Administered Medications   Medication Dose Route Frequency   ??? artificial tears (dextran 70-hypromellose) (NATURAL BALANCE) 0.1-0.3 % ophthalmic solution 2 Drop  2 Drop Both Eyes PRN   ??? atorvastatin (LIPITOR) tablet 80 mg  80 mg Oral QHS   ??? nebivoloL (BYSTOLIC) tablet 10 mg  10 mg Oral DAILY   ??? famotidine (PEPCID) tablet 20 mg  20 mg Oral BID   ??? vancomycin (VANCOCIN) 1,000 mg in 0.9% sodium chloride (MBP/ADV) 250 mL  1 g IntraVENous Q12H   ??? ferrous sulfate tablet 325 mg  1 Tab Oral BID WITH MEALS   ??? heparin (porcine) injection 5,000 Units  5,000 Units SubCUTAneous Q8H    ??? sodium chloride (NS) flush 5-40 mL  5-40 mL IntraVENous Q8H   ??? sodium chloride (NS) flush 5-40 mL  5-40 mL IntraVENous PRN   ??? acetaminophen (TYLENOL) tablet 650 mg  650 mg Oral Q4H PRN   ??? piperacillin-tazobactam (ZOSYN) 3.375 g in 0.9% sodium chloride (MBP/ADV) 100 mL  3.375 g IntraVENous Q8H   ??? b-complex with vitamin c tablet 1 Tab  1 Tab Oral DAILY   ??? cyanocobalamin (VITAMIN B12) tablet 500 mcg  500 mcg Oral DAILY   ??? DULoxetine (CYMBALTA) capsule 60 mg  60 mg Oral DAILY   ??? pregabalin (LYRICA) capsule 300 mg  300 mg Oral BID     Allergies  Allergies   Allergen Reactions   ??? Metoprolol Other (comments)     Patient states, made me feel horrible.     CBC:  Recent Labs     06/07/18  0359 06/06/18  0532 06/05/18  0304   WBC 4.7 4.3 4.2   HGB 11.1* 10.6* 9.9*   HCT 33.7* 31.9* 30.7*   PLT 223 207 171     Metabolic Panel:  Recent Labs     06/07/18  0359 06/06/18  0532 06/05/18  0304   NA 143 142 141   K 3.5 3.6 3.9   CL 112* 111* 113*   CO2 BUN CREA 0.90 0.96 0.88   GLU 79 78 74   CA 8.2* 8.3* 7.9*   MG 2.2 2.2 2.2   PHOS 3.8 3.3 3.6          For Billing    Chief Complaint   Patient presents with   ??? Fatigue   ??? Foot Problem       Hospital Problems  Date Reviewed: 06-19-18          Codes Class Noted POA    Abscess of right foot ICD-10-CM: L02.611  ICD-9-CM: 682.7  06/04/2018 Unknown        Acute osteomyelitis of left foot (HCC) ICD-10-CM: M86.172  ICD-9-CM: 730.07  06/04/2018 Unknown        Cellulitis ICD-10-CM: L03.90  ICD-9-CM: 682.9  2018/06/19 Unknown        * (Principal)  Right foot infection ICD-10-CM: L08.9  ICD-9-CM: 686.9  06/02/2018 Unknown        Polyneuropathy (Chronic) ICD-10-CM: G62.9  ICD-9-CM: 356.9  05/28/2016 Yes        Obesity ICD-10-CM: E66.9  ICD-9-CM: 278.00  04/24/2015 Unknown        Essential hypertension, benign (Chronic) ICD-10-CM: I10  ICD-9-CM: 401.1  02/21/2015 Yes    Overview Signed 08/10/2016  3:46 PM by Jimmey Ralph, LPN     Overview:   Qualifier: Diagnosis of    By: Claiborne Billings CMA, Jacqualynn              HLD (hyperlipidemia) (Chronic) ICD-10-CM: E78.5  ICD-9-CM: 272.4  02/21/2015 Yes        Coronary atherosclerosis of native coronary artery (Chronic) ICD-10-CM: I25.10  ICD-9-CM: 414.01  02/21/2015 Yes    Overview Addendum 08/10/2016  3:46 PM by Jimmey Ralph, LPN     S/p CABG x5. DOE was presenting complaint.             OSA on CPAP (Chronic) ICD-10-CM: Q59.56, Z99.89  ICD-9-CM: 327.23, V46.8  02/21/2015 Yes        S/P CABG (coronary artery bypass graft) ICD-10-CM: Z95.1  ICD-9-CM: V45.81  08/23/2013 Yes    Overview Signed 08/10/2016  3:46 PM by Jimmey Ralph, LPN     Overview:   2009

## 2018-06-07 NOTE — Progress Notes (Signed)
Bedside shift change report given to Demetria, RN (oncoming nurse) by Janine, RN  (offgoing nurse). Report included the following information SBAR and Kardex.

## 2018-06-07 NOTE — Progress Notes (Signed)
Nutrition Assessment:    RECOMMENDATIONS/INTERVENTION(S):   1. Continue regular diet.    2. Continue Ensure HP BID to promote adequate protein intake.    3. Continue to monitor intakes, wt changes, labs.    ASSESSMENT:   3/11: Pt assessed for LOS. Admitted with R foot cellulitis, s/p I&D. PMH includes HTN, CABG. BMI 38.1, c/w obesity. Pt reports good appetite and po intake. Recorded intakes 90-100%. Breakfast tray in room, 100% bacon and yogurt eaten, 50% oatmeal eaten. With with recent gastric sleeve surgery (11/19). Pt says at home he is eating 3 meals/day + one snack. No longer drinking protein drinks because he didn't think he was supposed to. Continues to take his MVI at home. Encouraged pt to drink protein drinks to ensure he is getting adequate protein, he was receptive and said he would start drinking them. Reports 70# weight loss since surgery. No c/o n/v/d/c. Meds- B complex w/ Vit. C, B12, ferrous sulfate, zosyn.     Diet Order: Regular  % Eaten:    Patient Vitals for the past 72 hrs:   % Diet Eaten   06/07/18 0839 100 %   06/06/18 0817 90 %   06/05/18 1020 100 %   06/04/18 1545 100 %       Pertinent Medications: [x] Reviewed    Labs: [x] Reviewed    Anthropometrics: Height: 6' 1" (185.4 cm) Weight: 131 kg (288 lb 12.8 oz)    IBW (%IBW):   ( ) UBW (%UBW):   (  %)      BMI: Body mass index is 38.1 kg/m??.    This BMI is indicative of:   [] Underweight    [] Normal    [] Overweight    [x]  Obesity    []  Extreme Obesity (BMI>40)  Estimated Nutrition Needs (Based on): 8466 Kcals/day(2126 x 1.3 AF (-250)) , 104 g(0.8-1 g/kg) Protein  Carbohydrate: At Least 130 g/day  Fluids: 2514 mL/day (1 ml/kcal)    Last BM: 3/10   [x]Active     []Hyperactive  []Hypoactive       [] Absent   BS  Skin:    [] Intact   [x] Incision  [] Breakdown   [] DTI   [] Tears/Excoriation/Abrasion  []Edema [] Other:     Wt Readings from Last 30 Encounters:   06/03/18 131 kg (288 lb 12.8 oz)   06/02/18 131.5 kg (290 lb)    05/29/18 131.1 kg (289 lb)   05/22/18 130.2 kg (287 lb)   05/19/18 128.8 kg (284 lb)   05/16/18 129.3 kg (285 lb)   04/05/18 142.2 kg (313 lb 9.6 oz)   01/04/18 (!) 161.5 kg (356 lb)   12/09/17 (!) 161.9 kg (357 lb)   11/23/17 (!) 160.6 kg (354 lb)   11/18/17 157.9 kg (348 lb)   11/03/17 (!) 159.7 kg (352 lb)   09/26/17 155.6 kg (343 lb)   09/08/17 (!) 163.1 kg (359 lb 9.6 oz)   03/17/17 (!) 159.4 kg (351 lb 6.4 oz)   11/15/16 157.7 kg (347 lb 9.6 oz)   10/13/16 155.1 kg (342 lb)   09/20/16 158.5 kg (349 lb 6.4 oz)   09/15/16 155 kg (341 lb 12.8 oz)   09/14/16 158.8 kg (350 lb)   09/03/16 156.5 kg (345 lb)   09/02/16 157.5 kg (347 lb 3.2 oz)   08/24/16 157.4 kg (347 lb)   08/10/16 157.4 kg (347 lb)   07/28/16 (!) 164.7 kg (363  lb)   07/27/16 (!) 164.7 kg (363 lb)   06/21/16 (!) 160.2 kg (353 lb 3.2 oz)   05/28/16 (!) 162.4 kg (358 lb)   05/25/16 (!) 159.1 kg (350 lb 12.8 oz)   04/07/16 (!) 159.8 kg (352 lb 6.4 oz)      NUTRITION DIAGNOSES:   Problem:  Overweight/obesity     Etiology: related to hx of excesss energy intake     Signs/Symptoms: as evidenced by BMI 38.1, recent bariatric surgery (11/19)      NUTRITION INTERVENTIONS:  Meals/Snacks: General/healthful diet   Supplements: Commercial supplement              GOAL:   PO intake >75% meals + ONS with 0.5#/week weight loss next 5-7 days    Cultural, Religious, or Ethnic Dietary Needs: None     EDUCATION & DISCHARGE NEEDS:    [x] None Identified   [] Identified and Education Provided/Documented   [] Identified and Pt declined/was not appropriate      [] Interdisciplinary Care Plan Reviewed/Documented    [x] Discharge Needs:  Regular diet + ONS   [] No Nutrition Related Discharge Needs    NUTRITION RISK:   Pt Is At Nutrition Risk  [x]     No Nutrition Risk Identified  []       PT SEEN FOR:    []  MD Consult: []Calorie Count      []Diabetic Diet Education        []Diet Education     []Electrolyte Management     []General Nutrition Management and Supplements      []Management of Tube Feeding     []TPN Recommendations    []  RN Referral:  []MST score >=2     []Enteral/Parenteral Nutrition PTA     []Pregnant: Gestational DM or Multigestation                 [] Pressure Ulcer    []  Low BMI      [x]  Length of Stay       [] Dysphagia Diet         [] Ventilator  []  Follow-up     Previous Recommendations:   [] Implemented          [] Not Implemented          [x] Not Applicable    Previous Goal:   [] Met              [] Progressing Towards Goal              [] Not Progressing Towards Goal   [x] Not Applicable            Vincenza Hews, [place]  Pager [phone]  Phone [phone]

## 2018-06-07 NOTE — Progress Notes (Signed)
PHYSICAL THERAPY:PT attempted x 2 today.Pt declined earlier today secondary to fatigue.Pt requested PT return.Pt unavailable this afternoon with case management reviewing.PT will follow in AM

## 2018-06-07 NOTE — Progress Notes (Signed)
PICC Placement Note    PRE-PROCEDURE VERIFICATION  Correct Procedure: yes  Correct Site:  yes  Temperature: Temp: 97.4 ??F (36.3 ??C), Temperature Source: Temp Source: Oral  Recent Labs     06/07/18  0359   BUN 7   CREA 0.90   PLT 223   WBC 4.7     Allergies: Metoprolol  Education materials for PICC Care given: yes. See Patient Education activity for further details.  PICC Booklet placed at bedside: yes    Closed Ended PICC Catheters:  Flush Lumens as Follows:  Intermittent Medication:   Flush before and after each medication with 10 ml NS.   Unused Ports:  Flush every 8 hours with 10 ml NS.  TPN Ports:  Flush every 24 hours with 20 ml NS prior to hanging new bag.  Blood Draws: Stop infusion, draw off and waste 10 ml of blood. Draw sample with 10cc syringe or greater. DO NOT USE VACUTAINER . Transfer with appropriate device to lab  tubes. Flush with 20 ml NS.  Dressing Change:  Every 7 days, and PRN using sterile technique if integrity of dressing is compromised.  Initial dressing change for central line 24-48 hours post insertion if gauze is used. Apply new dressing per policy.    PROCEDURE DETAIL  Consent was obtained and all questions were answered related to risks and benefits.   A single lumen PICC line was inserted, as a sterile procedure using ultrasound and modified Seldinger technique for antibiotic therapy. The following documentation is in addition to the PICC properties in the lines/airways flowsheet :  Lot #: [id number]  Lidocaine 1% administered intradermally :yes  Internal Catheter Total Length: 44 (cm) AND 2 CM OUT  Vein Selection for PICC:right brachial  Central Line Bundle followed yes  Complication Related to Insertion:no    The placement was verified by EKG, MAX P WAVE @ 44 (cm) at 2 cm out.  PER EKG PICC TIP @ C/A junction.        Line is okay to use: yes    [name] C Morton, RN

## 2018-06-07 NOTE — Progress Notes (Addendum)
Addendum to daily progress note:    - Patient started on Lisinopril 10 mg for persistent HTN not controlled with Bystolic.   - Patient was on Lisinopril 20 mg and amlodipine 5 mg in the past stopped d/t orthostatic hypotension, that is not noted during this admission. Patient is s/p bariatric surgery, which explains hypotension, but given string HTN history will likely need close outpatient follow up for BP control given Hx of CABG.   - Patient will be discharged on IV ABX via PICC    Merrianne Mccumbers, MD

## 2018-06-07 NOTE — Progress Notes (Signed)
Bedside shift change report given to Sherry (oncoming nurse) by Demetria (offgoing nurse). Report included the following information SBAR, Kardex, MAR and Recent Results.

## 2018-06-07 NOTE — Progress Notes (Signed)
Post Discharge PICC and Antibiotic Orders    1.  Diagnosis:  OM foot  2.  Antibiotic:  daptomycin 1000mg IV daily through 07/17/18                         Ceftriaxone 2gm IV daily through 07/17/18  3.  Routine PICC/ Hohn/ Portacath Care including PRN catheter flow management  4.  Weekly labs:   _x__CBC/diff/platelets   _x__BUN/Creatinine   _x__CPK   _x__AST/TotalBilirubin/AlkalinePhosphatase   _x__CRP   ___Gentamicin level  ___gentamicin peak/trough   Trough Vancomycin level ____goal 10-15 ___goal 15-20  5.  Fax lab to [phone]  Call Critical Lab Values to [phone]  6.  May send to IR for line evaluation or replacement PRN [phone] FAX [phone]  7.  Home Health to pull PIC line at end of therapy or send to IR for Hohn removal  8.  Allergies:    Allergies   Allergen Reactions   ??? Metoprolol Other (comments)     Patient states, made me feel horrible.     9. Pharmacy Consult for Vancomycin dosing/gentamicin dosing.      Graysin Luczynski, DO

## 2018-06-07 NOTE — Progress Notes (Signed)
[facility] Dosing Services: Antimicrobial Stewardship Daily Doc    Consult for antibiotic dosing of vancomycin by Dr. 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    Assessment/Plan:  SCr remains stable, no leukocytosis, no procalcitonin to evaluate, culture results still finalizing, afebrile in past 24 hours  Previous dose of 1250 mg (9.5 mg/kg) was not ideal and dose was changed to 1000 mg  (7.6 mg/kg) which is even less ideal. Recommended vancomycin dosing is between 10 and 20 mg/kg and generally try to extend interval if possible.  Will modify regimen to 2500 mg IV every 24 hours which predicts a trough of 15.2 mcg/mL (dose = 19.1 mg/kg, which is on higher end of acceptable range but q24h regimen would provide easier coordination in outpatient setting in anticipation of possible 6 week course). Will push first dose a few hours later to allow more clearance of previous dose.  BMP ordered daily by resident  Dose administration notes:   Doses given appropriately as scheduled    Date Dose & Interval Measured (mcg/mL) Extrapolated (mcg/mL)   ?3/8 at 1154 ?2000 mg IV q12h ?18.8 ?19.8   ?3/10 at 1902 ?1250 mg IV q12h ?21.7 ?20.7   ? ? ? ?       Other Antimicrobial   (not dosed by pharmacist) Zosyn 3.375 g IV every 8 hours extended infusion   Cultures 3/6 - Blood, paired - NGTD x 5 days - FINAL  3/6 - Wound - Moderate MSSA, moderate beta hemolytic strep - FINAL  3/7 - Tissue - Heavy beta hemolytic strep, rare GPC in clusters - FINAL  3/8 - Tissue and Anaerobe - NGTD x 2 days - Prelim  3/8 - Wound - Light  staph aureus MSSA, light beta hemolytic strep - FINAL   Serum Creatinine Lab Results   Component Value Date/Time    Creatinine 0.90 06/07/2018 03:59 AM         Creatinine Clearance Estimated Creatinine Clearance: 109.9 mL/min (based on SCr of 0.9 mg/dL).     Temp Temp: 98.1 F (36.7 C)       WBC Lab Results   Component Value Date/Time    WBC 4.7 06/07/2018 03:59 AM        H/H Lab Results   Component Value Date/Time    HGB 11.1 (L) 06/07/2018 03:59 AM        Platelets    Lab Results   Component Value Date/Time    PLATELET 223 06/07/2018 03:59 AM          For Antifungals, Metronidazole, and Nafcillin:  ALT:        AST:      Alk Phos:      T Bili:    Pharmacist Baldwin Crown, PHARMD Contact information: [phone]

## 2018-06-07 NOTE — Progress Notes (Signed)
PHYSICAL THERAPY:PT attempted x 2 today.Pt declined earlier today secondary to fatigue.Pt requested PT return.Pt unavailable this afternoon with case management reviewing.PT will follow in AM

## 2018-06-07 NOTE — Progress Notes (Signed)
Progress Notes by [name], MD at 06/07/18 [phone]                Author: [name], MD  Service: FAMILY MEDICINE  Author Type: Resident       Filed: 06/07/18 0806  Date of Service: 06/07/18 1914  Status: Attested Addendum          Editor: [name], MD (Resident)       Related Notes: Original Note by [name], MD (Resident) filed at 06/07/18 [phone]          Cosigner: Frankey Shown, MD at 06/07/18 1106          Attestation signed by Frankey Shown, MD at 06/07/18 1106          [facility] Attending Addendum:       I was NOT present with the resident during the interview & examination of the patient. I personally interviewed the patient & repeated the critical or key portions of the exam.  I agree with the resident's note with the following additions:       68 y.o. male who  has a past medical history of BPH (benign prostatic hyperplasia), CAD (coronary artery disease), History of vascular access device (09/08/2016), HLD (hyperlipidemia), HTN (hypertension), Morbid obesity due to excess calories (HCC)  (04/24/2015), OSA (obstructive sleep apnea), Polyneuropathy, and Tubular adenoma of colon. admitted for RLE cellulitis and bilateral foot ulcers.       Patient Vitals for the past 4 hrs:     Temp  Pulse  Resp  BP  SpO2      06/07/18 0836  97.8 ??F (36.6 ??C)  61  18  169/89  94 %         Pt resting comfortably, NAD   A&Ox3, pleasant and appropriate affect       1.  Bilateral foot ulcers, left foot osteomyelitis: Podiatry following, s/p I&D of R foot and debridement of left foot 5th digit and metatarsal on 3/8. Apprec podiatry and ID  assistance, will need PICC placement for 6 weeks of IV abx    2.  HTN/CAD: BP now elevated on home nebivolol  daily, will add another agent if BP remains high today       See resident note for detailed assessment and plan.       Frankey Shown, MD    Pt seen and examined on 06/07/2018                                            [address]   [place], [place] [address]    Office [phone]   Fax [phone]                Assessment and Plan        Elijah [name] is a 68 y.o. male with a medical hx of polyneuropathy, OSAS (CPAP), HTN HLD, CAD with CABG x5 in 2011, Obesity and s/p Gastric Sleeve in 01/2018??admitted for R foot wound.       24 Hour Events: No acute events.      Left foot osteomyelitis, abscess of right foot. S/p I&D of R foot and incision of L 5th digit and metatarsal.  Appreciate podiatry and IDrecs.    - Vanc and Zosyn for broad spectrum coverage    - blood cx  NG x5d and final bone tissue cx: NG x2d   - pathology report: benign bone w/o signs of osteomyelitis    - will discuss final pathology report with Dr Germaine Pomfret to decide if patient needs IV abx for discharge    - daily CBC/CMP/mag/phos??   - wound care following      Hypotension in the setting of HTN: Hx of CABG 2011. Recent 70 Lb weight loss from gastric bypass. POA BP ??118/66. ??Iron profile,  TSH and B12 from 3/2 wnl. Orthostatics negative Echo nl cavity size. LVEF 55-60%    - Bystolic  daily    - daily CBC/CMP/mag/phos??   ??   S/p Gastric Sleeve: 70 lb wt loss since 11/19    -continue home B complex    -continue home B12??   - ferrous sulfate 325 mg       Heartburn: no Hx of GERD   - pepcid 20 daily   ??   Hyperlipidemia:??Last lipid panel on??1/20??and values were TC- 118, TG- 119, ??HDL-41, LDL-53,    -Continue home??Lipitor  daily    ??   Polyneuropathy: chronic bilateral below the knee    -continue home Lyrica  bid    -continue home Cymbalta  once daily??   ??   Sleep Apnea:    -continue CPAP qhs??   ??   Obesity??BMI Body mass index is 38.1 kg/m??.   - Encouraging lifestyle modifications and further follow up outpatient.       FEN/GI - Regular diet.    Activity - Ambulate with assistance   DVT prophylaxis - Sub Q Heparin   GI prophylaxis - Not indicated at this time   Disposition - Plan to d/c to [facility]. PT/OT  following    Code Status - Full      I appreciate the opportunity to participate in the care of this patient,   [name], MD   [facility] Medicine Resident              Subjective / Objective        Subjective: Patient does not have any complaints. Wants to go home.      Temp (24hrs), Avg:98.1 ??F (36.7 ??C), Min:97.7 ??F (36.5 ??C), Max:98.4 ??F (36.9 ??C)       Physical Exam   Constitutional :        General: He is not in acute distress.     Appearance: He is not ill-appearing.   Cardiovascular:       Rate and Rhythm: Normal rate and regular rhythm.      Heart sounds: No murmur.    Pulmonary:       Effort: Pulmonary effort is normal.      Breath sounds: No wheezing.   Musculoskeletal:          General: No swelling.      Comments: Wounds are covered by dressing           Respiratory: O2 Flow Rate (L/min): 3 l/min O2 Device: Room air       I/O:       Date  06/06/18 0700 - 06/07/18 0659  06/07/18 0700 - 06/08/18 0659             Shift  0700-1859  1900-0659  24 Hour Total  0700-1859  1900-0659  24 Hour Total       INTAKE             P.O.  240  1240  1480  P.O.  240  1240  1480                   Shift Total(mL/kg)  240(1.8)  1240(9.5)  1480(11.3)             OUTPUT             Urine(mL/kg/hr)  1775(1.1)  1475  3250                     Urine Voided  1775  1475  3250                   Shift Total(mL/kg)  1775(13.5)  1475(11.3)  3250(24.8)                   NET  -1535  -235  -1770                   Weight (kg)  131  131  131  131  131  131           Inpatient Medications     Current Facility-Administered Medications          Medication  Dose  Route  Frequency           ?  artificial tears (dextran 70-hypromellose) (NATURAL BALANCE) 0.1-0.3 % ophthalmic solution 2 Drop   2 Drop  Both Eyes  PRN           ?  atorvastatin (LIPITOR) tablet 80 mg   80 mg  Oral  QHS     ?  nebivoloL (BYSTOLIC) tablet 10 mg   10 mg  Oral  DAILY     ?  famotidine (PEPCID) tablet 20 mg   20 mg  Oral  BID     ?  vancomycin  (VANCOCIN) 1,000 mg in 0.9% sodium chloride (MBP/ADV) 250 mL   1 g  IntraVENous  Q12H     ?  ferrous sulfate tablet 325 mg   1 Tab  Oral  BID WITH MEALS     ?  heparin (porcine) injection 5,000 Units   5,000 Units  SubCUTAneous  Q8H     ?  sodium chloride (NS) flush 5-40 mL   5-40 mL  IntraVENous  Q8H     ?  sodium chloride (NS) flush 5-40 mL   5-40 mL  IntraVENous  PRN     ?  acetaminophen (TYLENOL) tablet 650 mg   650 mg  Oral  Q4H PRN     ?  piperacillin-tazobactam (ZOSYN) 3.375 g in 0.9% sodium chloride (MBP/ADV) 100 mL   3.375 g  IntraVENous  Q8H     ?  b-complex with vitamin c tablet 1 Tab   1 Tab  Oral  DAILY     ?  cyanocobalamin (VITAMIN B12) tablet 500 mcg   500 mcg  Oral  DAILY     ?  DULoxetine (CYMBALTA) capsule 60 mg   60 mg  Oral  DAILY           ?  pregabalin (LYRICA) capsule 300 mg   300 mg  Oral  BID        Allergies     Allergies        Allergen  Reactions         ?  Metoprolol  Other (comments)             Patient states, made me feel horrible.  CBC:     Recent Labs             06/07/18   0359  06/06/18   0532  06/05/18   0304     WBC  4.7  4.3  4.2     HGB  11.1*  10.6*  9.9*     HCT  33.7*  31.9*  30.7*          PLT  223  207  171        Metabolic Panel:     Recent Labs             06/07/18   0359  06/06/18   0532  06/05/18   0304     NA  143  142  141     K  3.5  3.6  3.9     CL  112*  111*  113*     CO2  25  24  21      BUN  7  6  6      CREA  0.90  0.96  0.88     GLU  79  78  74     CA  8.2*  8.3*  7.9*     MG  2.2  2.2  2.2          PHOS  3.8  3.3  3.6                   For Billing          Chief Complaint      Patient presents with      ?  Fatigue      ?  Foot Problem               Hospital Problems   Date Reviewed:  06/30/2018                 Codes  Class  Noted  POA        Abscess of right foot  ICD-10-CM: L02.611   ICD-9-CM: 682.7    06/04/2018  Unknown                Acute osteomyelitis of left foot (HCC)  ICD-10-CM: M86.172   ICD-9-CM: 730.07    06/04/2018  Unknown                 Cellulitis  ICD-10-CM: L03.90   ICD-9-CM: 682.9    06-30-18  Unknown                * (Principal) Right foot infection  ICD-10-CM: L08.9   ICD-9-CM: 686.9    2018/06/30  Unknown                Polyneuropathy (Chronic)  ICD-10-CM: G62.9   ICD-9-CM: 356.9    05/28/2016  Yes                Obesity  ICD-10-CM: E66.9   ICD-9-CM: 278.00    04/24/2015  Unknown                Essential hypertension, benign (Chronic)  ICD-10-CM: I10   ICD-9-CM: 401.1    02/21/2015  Yes        Overview Signed 08/10/2016  3:46 PM by Jimmey Ralph, LPN          Overview:    Qualifier: Diagnosis of    By: Claiborne Billings CMA, Terance Ice  HLD (hyperlipidemia) (Chronic)  ICD-10-CM: E78.5   ICD-9-CM: 272.4    02/21/2015  Yes                Coronary atherosclerosis of native coronary artery (Chronic)  ICD-10-CM: I25.10   ICD-9-CM: 414.01    02/21/2015  Yes        Overview Addendum 08/10/2016  3:46 PM by Jimmey Ralph, LPN          S/p CABG x5. DOE was presenting complaint.                         OSA on CPAP (Chronic)  ICD-10-CM: J47.82, Z99.89   ICD-9-CM: 327.23, V46.8    02/21/2015  Yes                S/P CABG (coronary artery bypass graft)  ICD-10-CM: Z95.1   ICD-9-CM: V45.81    08/23/2013  Yes        Overview Signed 08/10/2016  3:46 PM by Jimmey Ralph, LPN          Overview:    2009

## 2018-06-07 NOTE — Progress Notes (Signed)
 PICC Placement Note    PRE-PROCEDURE VERIFICATION  Correct Procedure: yes  Correct Site:  yes  Temperature: Temp: 97.4 F (36.3 C), Temperature Source: Temp Source: Oral  Recent Labs     06/07/18  0359   BUN 7   CREA 0.90   PLT 223   WBC 4.7     Allergies: Metoprolol  Education materials for PICC Care given: yes. See Patient Education activity for further details.  PICC Booklet placed at bedside: yes    Closed Ended PICC Catheters:  Flush Lumens as Follows:  Intermittent Medication:   Flush before and after each medication with 10 ml NS.   Unused Ports:  Flush every 8 hours with 10 ml NS.  TPN Ports:  Flush every 24 hours with 20 ml NS prior to hanging new bag.  Blood Draws: Stop infusion, draw off and waste 10 ml of blood. Draw sample with 10cc syringe or greater. DO NOT USE VACUTAINER . Transfer with appropriate device to lab  tubes. Flush with 20 ml NS.  Dressing Change:  Every 7 days, and PRN using sterile technique if integrity of dressing is compromised.  Initial dressing change for central line 24-48 hours post insertion if gauze is used. Apply new dressing per policy.    PROCEDURE DETAIL  Consent was obtained and all questions were answered related to risks and benefits.   A single lumen PICC line was inserted, as a sterile procedure using ultrasound and modified Seldinger technique for antibiotic therapy. The following documentation is in addition to the PICC properties in the lines/airways flowsheet :  Lot #: [id number]  Lidocaine 1% administered intradermally :yes  Internal Catheter Total Length: 44 (cm) AND 2 CM OUT  Vein Selection for PICC:right brachial  Central Line Bundle followed yes  Complication Related to Insertion:no    The placement was verified by EKG, MAX P WAVE @ 44 (cm) at 2 cm out.  PER EKG PICC TIP @ C/A junction.        Line is okay to use: yes    Avelina JAYSON Lager, RN

## 2018-06-07 NOTE — Progress Notes (Signed)
06-07-2018 CASE MANAGEMENT NOTE:  I have met with the patient several time to discuss discharge needs/plans. He stated he lives with his wife, Yaden Dralle ([phone]), in a 4 story duplex in [place], [place]. He also lives part-time in his 2 story farmhouse at [address], [place], [place] [address]. He has been independent with his ADL's, has a cane and CPaP and drives. Another CM sent a referral to [facility] for a rolling walker and they have informed me they are not in network with his insurance. The other DME companies do not deliver walkers to the hospital and the patient has asked his wife to purchase a RW for him. I was able to locate a home infusion company, [facility] in [place], [place] who can supply the home IVAB in [place]. I sent the referral to them thru [employer] and have spoken with [phone]-who informed me the patient will have the folowing co-pays:$1365 for supplies, $100/week for Daptomycin and $10/week for Ceftriaxone. The co-pays for the medications could change based on his insurance (ie-goes into the donut hole and then may have to pay the medications in full). I informed the patient of this and, although not pleased, is agreeable to paying the charges. He has had home IVAB several years ago and is trying to reach the home health RN as he would like to use the same company-cannot remember the name of the agency and left a message for the RN. I will pass this on in my hand-off to the CM for 06-08-2018. Sudy Adams, BSW, CM

## 2018-06-07 NOTE — Progress Notes (Signed)
 Bedside shift change report given to Cordelia Pen (Cabin crew) by Mickeal Needy (offgoing nurse). Report included the following information SBAR, Kardex, MAR and Recent Results.

## 2018-06-07 NOTE — Progress Notes (Signed)
Addendum to daily progress note:    - Patient started on Lisinopril 10 mg for persistent HTN not controlled with Bystolic.   - Patient was on Lisinopril 20 mg and amlodipine 5 mg in the past stopped d/t orthostatic hypotension, that is not noted during this admission. Patient is s/p bariatric surgery, which explains hypotension, but given string HTN history will likely need close outpatient follow up for BP control given Hx of CABG.   - Patient will be discharged on IV ABX via PICC    [name], MD

## 2018-06-07 NOTE — Progress Notes (Signed)
VAT note- To acknowledge order for PICC placement for long term antibiotics .  Chart reviewed for PICC placement evaluation. Will plan to place line this afternoon.

## 2018-06-07 NOTE — Progress Notes (Signed)
[facility]   Podiatric Surgery  Post-Op Note    Subjective:   Admit Date: 06/02/2018  OR Date: 06/04/2018  Post-op: 3 Days Post-Op  Status Post: Incision and drainage, right foot, multiple fascial spaces, Incision of bone cortex, left fifth digit proximal phalanx, Incision of bone cortex, left fifth metatarsal.    Elijah [name]  appears; alert, well appearing, and in no distress and oriented to person, place, and time  Pain control is: excellent    Objective::    height is 6\' 1"  (1.854 m) and weight is 131 kg (288 lb 12.8 oz). His temperature is 98.4 ??F (36.9 ??C). His blood pressure is 147/77 and his pulse is 63. His respiration is 18 and oxygen saturation is 96%.     Intake/Output Summary (Last 24 hours) at 06/07/2018 2007  Last data filed at 06/07/2018 1653  Gross per 24 hour   Intake 2900 ml   Output 4075 ml   Net -1175 ml       Physical Exam:  Incision: : well approximated; packing intact to the right foot; No drainage, no pus, no purulence on compression  Wound left foot sub 5th metatarsal head - no drainage, no pus, no purulence  Abdomen: soft, nontender, nondistended, no masses or organomegaly  DVT Exam:No evidence of DVT seen on physical exam.    EXTENDED exam: CRT to distal digits is WNL    Labs:  Lab Results   Component Value Date/Time    WBC 4.7 06/07/2018 03:59 AM    HGB 11.1 (L) 06/07/2018 03:59 AM    HCT 33.7 (L) 06/07/2018 03:59 AM    MCV 93.6 06/07/2018 03:59 AM    PLATELET 223 06/07/2018 03:59 AM     Lab Results   Component Value Date/Time    Sodium 143 06/07/2018 03:59 AM    Potassium 3.5 06/07/2018 03:59 AM    Chloride 112 (H) 06/07/2018 03:59 AM    CO2 25 06/07/2018 03:59 AM    Phosphorus 3.8 06/07/2018 03:59 AM    BUN 7 06/07/2018 03:59 AM    Calcium 8.2 (L) 06/07/2018 03:59 AM      Lab Results   Component Value Date/Time    Glucose 79 06/07/2018 03:59 AM       Impression/Plan:   Principal Problem:    Right foot infection (06/02/2018)    Active Problems:    Essential hypertension, benign  (02/21/2015)      Overview: Overview:       Qualifier: Diagnosis of       By: Claiborne Billings CMA, Jacqualynn       HLD (hyperlipidemia) (02/21/2015)      Coronary atherosclerosis of native coronary artery (02/21/2015)      Overview: S/p CABG x5. DOE was presenting complaint.      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 Bone and soft tissue, left 5th toe, biopsy:   Benign bone and fibrovascular tissue   No evidence of acute osteomyelitis   2. Bone, left 5th metatarsal, biopsy:  Benign bone with hypocellular marrow   No evidence of acute osteomyelitis   *Electronically Signed Out By Venia Carbon, M.D.*       S/P right foot I&D and left foot bone biopsy x2    Plan:  ??? Patient seen and evaluated.   ??? Pathology results - negative for OM however, there is still a significant clinical suspicion for osteo and I agree with ID's recommendations of 6 weeks of IV antibiotics.   ??? Incision sites and ulcer cleansed. Sutures from the LEFT foot were removed without incident.  ??? Dressings re-applied to bilateral feet. A unna boot compressive wrap was applied to the RIGHT foot and leg as he will be traveling to [place] tomorrow and the change of his RLE swelling causing the plantar sutures to open is significant. This dressing should allow for the edema to be minimal. Patient understands he will need to elevate his lower extremities on the drive to prevent excessive edema. He also must limit his weightbearing status.  ??? Home health for wound care and IV antibiotics.  ??? I also recommended that he follow up with Dr. Ria Comment while in [place] within one week for further evaluation.   ??? Patient was also given information for my office and will call when he  returns to the [place] area to schedule an appointment.  ??? PWBAT to the right heel and WBAT to the LEFT foot  ??? Continue to elevate b/l lower extremities at rest  ??? Will follow while inpatient.  Call with questions    Marlyce Huge A. Sherryll Burger, DPM  [phone] (o)  June 07, 2018  8:06 PM

## 2018-06-07 NOTE — Consults (Signed)
[facility] Progress Note           Ubaldo Glassing DO    [phone] Office  [phone]  Fax    06/07/2018      Assessment & Plan:   1.  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 The patient is status post incision and drainage with bone biopsy. Blood cultures reveal no growth to date. Personal review of MRI images with radiology shows undeniable signs of osteomyelitis despite negative pathology. Have discussed case with patient. Believe it would be prudent to give 6 weeks of IV Daptomycin and ceftriaxone. Will place order for PICC. Plan to be discussed with case management.  2.  Right foot abscess - The patient is status post incision and drainage on 03/08.  Cultures are growing group B streptococcus and methicillin-sensitive Staphylococcus aureus.  No osteomyelitis was noted on MRI.  Continue antibiotics as above.  3.  Morbid obesity - The patient is status post gastric sleeve surgery.          Subjective:     No complaints today. In no pain and tolerating diet.    Objective:     Vitals:   Visit Vitals  BP 169/89 (BP 1 Location: Left arm, BP Patient Position: At rest)   Pulse 61   Temp 97.8 ??F (36.6 ??C)   Resp 18   Ht 6\' 1"  (1.854 m)   Wt 288 lb 12.8 oz (131 kg)   SpO2 94%   BMI 38.10 kg/m??        Tmax:  Temp (24hrs), Avg:98 ??F (36.7 ??C), Min:97.7 ??F (36.5 ??C), Max:98.4 ??F (36.9 ??C)      Exam:   Patient is intubated:  no    Physical Examination:   General:  Alert, cooperative, no distress   Head:  Normocephalic, atraumatic.   Eyes:  Conjunctivae clear   Neck: Supple   Lungs:   No distress.  Speaking in complete sentences without labored breathing.   Chest wall:  No tenderness or deformity.   Heart:  Regular rate and rhythm, S1, S2 normal, no murmur   Abdomen:   Soft, non-tender, non-distended   Extremities: Moves all.  No cyanosis or edema. Feet bandaged  bilaterally   Skin:  No rashes or lesions   Neurologic: CNII-XII intact. Normal strength     Labs:        No lab exists for component: ITNL   No results for input(s): CPK, CKMB, TROIQ in the last 72 hours.  Recent Labs     06/07/18  0359 06/06/18  0532 06/05/18  0304   NA 143 142 141   K 3.5 3.6 3.9   CL 112* 111* 113*   CO2 25 24 21    BUN 7 6 6    CREA 0.90 0.96 0.88   GLU 79 78 74   PHOS 3.8 3.3 3.6   MG 2.2 2.2 2.2   WBC 4.7 4.3 4.2   HGB 11.1* 10.6* 9.9*   HCT 33.7* 31.9* 30.7*   PLT 223 207 171     No results for input(s): INR, PTP, APTT, INREXT in the last 72 hours.  Needs: urine analysis, urine sodium, protein and creatinine  No results found for: NAU, CREAU      Cultures:     No results found for: SDES  Lab Results   Component  Value Date/Time    Culture result: LIGHT STAPHYLOCOCCUS AUREUS (A) 06/04/2018 10:30 AM    Culture result: (A) 06/04/2018 10:30 AM     LIGHT STREPTOCOCCI, BETA HEMOLYTIC GROUP B . Penicillin and ampicillin are drugs of choice for treatment of beta-hemolytic streptococcal infections. Susceptibility testing of penicillins and beta-lactams approved by the FDA for treatment of beta-hemolytic streptococcal infections need not be performed routinely, because nonsusceptible isolates are extremely rare. CLSI 2012    Culture result: NO ANAEROBES ISOLATED 06/04/2018 10:30 AM       Radiology:     Medications       Current Facility-Administered Medications   Medication Dose Route Frequency Last Dose   ??? artificial tears (dextran 70-hypromellose) (NATURAL BALANCE) 0.1-0.3 % ophthalmic solution 2 Drop  2 Drop Both Eyes PRN 2 Drop at 06/07/18 0536   ??? vancomycin (VANCOCIN) 2,500 mg in 0.9% sodium chloride 500 mL IVPB  2,500 mg IntraVENous Q24H     ??? atorvastatin (LIPITOR) tablet 80 mg  80 mg Oral QHS 80 mg at 06/06/18 2056   ??? nebivoloL (BYSTOLIC) tablet 10 mg  10 mg Oral DAILY 10 mg at 06/07/18 0813   ??? famotidine (PEPCID) tablet 20 mg  20 mg Oral BID 20 mg at 06/07/18 3903   ??? ferrous sulfate tablet  325 mg  1 Tab Oral BID WITH MEALS 325 mg at 06/07/18 0813   ??? heparin (porcine) injection 5,000 Units  5,000 Units SubCUTAneous Q8H 5,000 Units at 06/07/18 0814   ??? sodium chloride (NS) flush 5-40 mL  5-40 mL IntraVENous Q8H 10 mL at 06/07/18 0537   ??? sodium chloride (NS) flush 5-40 mL  5-40 mL IntraVENous PRN     ??? acetaminophen (TYLENOL) tablet 650 mg  650 mg Oral Q4H PRN     ??? piperacillin-tazobactam (ZOSYN) 3.375 g in 0.9% sodium chloride (MBP/ADV) 100 mL  3.375 g IntraVENous Q8H 3.375 g at 06/07/18 0536   ??? b-complex with vitamin c tablet 1 Tab  1 Tab Oral DAILY 1 Tab at 06/07/18 0813   ??? cyanocobalamin (VITAMIN B12) tablet 500 mcg  500 mcg Oral DAILY 500 mcg at 06/07/18 0814   ??? DULoxetine (CYMBALTA) capsule 60 mg  60 mg Oral DAILY 60 mg at 06/07/18 0813   ??? pregabalin (LYRICA) capsule 300 mg  300 mg Oral BID 300 mg at 06/07/18 0813           Case discussed with: Patient, primary team, will be discussed with case management      Ubaldo Glassing DO

## 2018-06-07 NOTE — Progress Notes (Signed)
 Nutrition Assessment:    RECOMMENDATIONS/INTERVENTION(S):   1. Continue regular diet.    2. Continue Ensure HP BID to promote adequate protein intake.    3. Continue to monitor intakes, wt changes, labs.    ASSESSMENT:   3/11: Pt assessed for LOS. Admitted with R foot cellulitis, s/p I&D. PMH includes HTN, CABG. BMI 38.1, c/w obesity. Pt reports good appetite and po intake. Recorded intakes 90-100%. Breakfast tray in room, 100% bacon and yogurt eaten, 50% oatmeal eaten. With with recent gastric sleeve surgery (11/19). Pt says at home he is eating 3 meals/day + one snack. No longer drinking protein drinks because he didn't think he was supposed to. Continues to take his MVI at home. Encouraged pt to drink protein drinks to ensure he is getting adequate protein, he was receptive and said he would start drinking them. Reports 70# weight loss since surgery. No c/o n/v/d/c. Meds- B complex w/ Vit. C, B12, ferrous sulfate, zosyn.     Diet Order: Regular  % Eaten:    Patient Vitals for the past 72 hrs:   % Diet Eaten   06/07/18 0839 100 %   06/06/18 0817 90 %   06/05/18 1020 100 %   06/04/18 1545 100 %       Pertinent Medications: [x]  Reviewed    Labs: [x]  Reviewed    Anthropometrics: Height: 6' 1 (185.4 cm) Weight: 131 kg (288 lb 12.8 oz)    IBW (%IBW):   ( ) UBW (%UBW):   (  %)      BMI: Body mass index is 38.1 kg/m.    This BMI is indicative of:   []  Underweight    []  Normal    []  Overweight    [x]   Obesity    []   Extreme Obesity (BMI>40)  Estimated Nutrition Needs (Based on): 2514 Kcals/day(2126 x 1.3 AF (-250)) , 104 g(0.8-1 g/kg) Protein  Carbohydrate: At Least 130 g/day  Fluids: 2514 mL/day (1 ml/kcal)    Last BM: 3/10   [x] Active     [] Hyperactive  [] Hypoactive       []  Absent   BS  Skin:    []  Intact   [x]  Incision  []  Breakdown   []  DTI   []  Tears/Excoriation/Abrasion  [] Edema []  Other:     Wt Readings from Last 30 Encounters:   06/03/18 131 kg (288 lb 12.8 oz)   06/02/18 131.5 kg (290 lb)   05/29/18 131.1 kg  (289 lb)   05/22/18 130.2 kg (287 lb)   05/19/18 128.8 kg (284 lb)   05/16/18 129.3 kg (285 lb)   04/05/18 142.2 kg (313 lb 9.6 oz)   01/04/18 (!) 161.5 kg (356 lb)   12/09/17 (!) 161.9 kg (357 lb)   11/23/17 (!) 160.6 kg (354 lb)   11/18/17 157.9 kg (348 lb)   11/03/17 (!) 159.7 kg (352 lb)   09/26/17 155.6 kg (343 lb)   09/08/17 (!) 163.1 kg (359 lb 9.6 oz)   03/17/17 (!) 159.4 kg (351 lb 6.4 oz)   11/15/16 157.7 kg (347 lb 9.6 oz)   10/13/16 155.1 kg (342 lb)   09/20/16 158.5 kg (349 lb 6.4 oz)   09/15/16 155 kg (341 lb 12.8 oz)   09/14/16 158.8 kg (350 lb)   09/03/16 156.5 kg (345 lb)   09/02/16 157.5 kg (347 lb 3.2 oz)   08/24/16 157.4 kg (347 lb)   08/10/16 157.4 kg (347 lb)   07/28/16 (!) 164.7 kg (363  lb)   07/27/16 (!) 164.7 kg (363 lb)   06/21/16 (!) 160.2 kg (353 lb 3.2 oz)   05/28/16 (!) 162.4 kg (358 lb)   05/25/16 (!) 159.1 kg (350 lb 12.8 oz)   04/07/16 (!) 159.8 kg (352 lb 6.4 oz)      NUTRITION DIAGNOSES:   Problem:  Overweight/obesity     Etiology: related to hx of excesss energy intake     Signs/Symptoms: as evidenced by BMI 38.1, recent bariatric surgery (11/19)      NUTRITION INTERVENTIONS:  Meals/Snacks: General/healthful diet   Supplements: Commercial supplement              GOAL:   PO intake >75% meals + ONS with 0.5#/week weight loss next 5-7 days    Cultural, Religious, or Ethnic Dietary Needs: None     EDUCATION & DISCHARGE NEEDS:    [x]  None Identified   []  Identified and Education Provided/Documented   []  Identified and Pt declined/was not appropriate      []  Interdisciplinary Care Plan Reviewed/Documented    [x]  Discharge Needs:  Regular diet + ONS   []  No Nutrition Related Discharge Needs    NUTRITION RISK:   Pt Is At Nutrition Risk  [x]      No Nutrition Risk Identified  []        PT SEEN FOR:    []   MD Consult: [] Calorie Count      [] Diabetic Diet Education        [] Diet Education     [] Electrolyte Management     [] General Nutrition Management and Supplements     [] Management of Tube  Feeding     [] TPN Recommendations    []   RN Referral:  [] MST score >=2     [] Enteral/Parenteral Nutrition PTA     [] Pregnant: Gestational DM or Multigestation                 []  Pressure Ulcer    []   Low BMI      [x]   Length of Stay       []  Dysphagia Diet         []  Ventilator  []   Follow-up     Previous Recommendations:   []  Implemented          []  Not Implemented          [x]  Not Applicable    Previous Goal:   []  Met              []  Progressing Towards Goal              []  Not Progressing Towards Goal   [x]  Not Applicable            Elijah [name], RDN  Pager [phone]  Phone [phone]

## 2018-06-07 NOTE — Progress Notes (Signed)
Post Discharge PICC and Antibiotic Orders    1.  Diagnosis:  OM foot  2.  Antibiotic:  daptomycin 1000mg  IV daily through 07/17/18                         Ceftriaxone 2gm IV daily through 07/17/18  3.  Routine PICC/ Hohn/ Portacath Care including PRN catheter flow management  4.  Weekly labs:   _x__CBC/diff/platelets   _x__BUN/Creatinine   _x__CPK   _x__AST/TotalBilirubin/AlkalinePhosphatase   _x__CRP   ___Gentamicin level  ___gentamicin peak/trough   Trough Vancomycin level ____goal 10-15 ___goal 15-20  5.  Fax lab to [phone]  Call Critical Lab Values to [phone]  6.  May send to IR for line evaluation or replacement PRN [phone] FAX [phone]  7.  Home Health to pull PIC line at end of therapy or send to IR for Hohn removal  8.  Allergies:    Allergies   Allergen Reactions   ??? Metoprolol Other (comments)     Patient states, made me feel horrible.     9. Pharmacy Consult for Vancomycin dosing/gentamicin dosing.      Ubaldo Glassing, DO

## 2018-06-07 NOTE — Progress Notes (Signed)
Bedside shift change report given to [employer], RN (Cabin crew) by Campbell Lerner, RN  (offgoing nurse). Report included the following information SBAR and Kardex.

## 2018-06-08 LAB — CBC
Hematocrit: 33.2 % — ABNORMAL LOW (ref 36.6–50.3)
Hemoglobin: 11 g/dL — ABNORMAL LOW (ref 12.1–17.0)
MCH: 31.1 PG (ref 26.0–34.0)
MCHC: 33.1 g/dL (ref 30.0–36.5)
MCV: 93.8 FL (ref 80.0–99.0)
MPV: 10.7 FL (ref 8.9–12.9)
NRBC Absolute: 0 10*3/uL (ref 0.00–0.01)
Nucleated RBCs: 0 PER 100 WBC
Platelets: 241 10*3/uL (ref 150–400)
RBC: 3.54 M/uL — ABNORMAL LOW (ref 4.10–5.70)
RDW: 14.8 % — ABNORMAL HIGH (ref 11.5–14.5)
WBC: 5.7 10*3/uL (ref 4.1–11.1)

## 2018-06-08 LAB — CULTURE, ANAEROBIC

## 2018-06-08 LAB — CULTURE, TISSUE W GRAM STAIN
GRAM STAIN: NONE SEEN
GRAM STAIN: NONE SEEN
Gram Stain Result: NONE SEEN
Gram Stain Result: NONE SEEN

## 2018-06-08 LAB — MAGNESIUM
Magnesium: 2.3 mg/dL (ref 1.6–2.4)
Magnesium: 2.3 mg/dL (ref 1.6–2.4)

## 2018-06-08 LAB — BASIC METABOLIC PANEL
Anion Gap: 5 mmol/L (ref 5–15)
BUN: 10 MG/DL (ref 6–20)
Bun/Cre Ratio: 9 — ABNORMAL LOW (ref 12–20)
CO2: 27 mmol/L (ref 21–32)
Calcium: 8.2 MG/DL — ABNORMAL LOW (ref 8.5–10.1)
Chloride: 112 mmol/L — ABNORMAL HIGH (ref 97–108)
Creatinine: 1.08 MG/DL (ref 0.70–1.30)
EGFR IF NonAfrican American: >60 mL/min/{1.73_m2} (ref >60)
GFR African American: >60 mL/min/{1.73_m2} (ref >60)
Glucose: 80 mg/dL (ref 65–100)
Potassium: 3.4 mmol/L — ABNORMAL LOW (ref 3.5–5.1)
Sodium: 144 mmol/L (ref 136–145)

## 2018-06-08 LAB — PHOSPHORUS
Phosphorus: 3.6 MG/DL (ref 2.6–4.7)
Phosphorus: 3.6 MG/DL (ref 2.6–4.7)

## 2018-06-08 LAB — METABOLIC PANEL, BASIC
Anion gap: 5 mmol/L (ref 5–15)
BUN/Creatinine ratio: 9 — ABNORMAL LOW (ref 12–20)
BUN: 10 MG/DL (ref 6–20)
CO2: 27 mmol/L (ref 21–32)
Calcium: 8.2 MG/DL — ABNORMAL LOW (ref 8.5–10.1)
Chloride: 112 mmol/L — ABNORMAL HIGH (ref 97–108)
Creatinine: 1.08 MG/DL (ref 0.70–1.30)
GFR est AA: >60 mL/min/{1.73_m2} (ref >60)
GFR est non-AA: >60 mL/min/{1.73_m2} (ref >60)
Glucose: 80 mg/dL (ref 65–100)
Potassium: 3.4 mmol/L — ABNORMAL LOW (ref 3.5–5.1)
Sodium: 144 mmol/L (ref 136–145)

## 2018-06-08 LAB — CBC W/O DIFF
ABSOLUTE NRBC: 0 10*3/uL (ref 0.00–0.01)
HCT: 33.2 % — ABNORMAL LOW (ref 36.6–50.3)
HGB: 11 g/dL — ABNORMAL LOW (ref 12.1–17.0)
MCH: 31.1 PG (ref 26.0–34.0)
MCHC: 33.1 g/dL (ref 30.0–36.5)
MCV: 93.8 FL (ref 80.0–99.0)
MPV: 10.7 FL (ref 8.9–12.9)
NRBC: 0 PER 100 WBC
PLATELET: 241 10*3/uL (ref 150–400)
RBC: 3.54 M/uL — ABNORMAL LOW (ref 4.10–5.70)
RDW: 14.8 % — ABNORMAL HIGH (ref 11.5–14.5)
WBC: 5.7 10*3/uL (ref 4.1–11.1)

## 2018-06-08 MED ORDER — DAPTOMYCIN 500 MG IV SOLUTION
500 mg | INTRAVENOUS | Status: AC
Start: 2018-06-08 — End: 2018-06-08
  Administered 2018-06-08: 17:00:00 via INTRAVENOUS

## 2018-06-08 MED ORDER — POTASSIUM BICARBONATE-CITRIC ACID 20 MEQ EFFERVESCENT TABLET
20 mEq | ORAL | Status: AC
Start: 2018-06-08 — End: 2018-06-08
  Administered 2018-06-08: 11:00:00 via ORAL

## 2018-06-08 MED ORDER — FERROUS SULFATE 325 MG (65 MG ELEMENTAL IRON) TAB
325 mg (65 mg iron) | ORAL_TABLET | Freq: Two times a day (BID) | ORAL | 0 refills | Status: DC
Start: 2018-06-08 — End: 2018-06-13

## 2018-06-08 MED ORDER — LISINOPRIL 10 MG TAB
10 mg | ORAL_TABLET | Freq: Every day | ORAL | 0 refills | Status: DC
Start: 2018-06-08 — End: 2018-06-13

## 2018-06-08 MED ORDER — WATER FOR INJECTION, STERILE INJECTION
2 gram | INTRAMUSCULAR | Status: AC
Start: 2018-06-08 — End: 2018-06-08
  Administered 2018-06-08: 17:00:00 via INTRAVENOUS

## 2018-06-08 MED FILL — PREGABALIN 75 MG CAP: 75 mg | ORAL | Qty: 4

## 2018-06-08 MED FILL — ATORVASTATIN 20 MG TAB: 20 mg | ORAL | Qty: 4

## 2018-06-08 MED FILL — CYANOCOBALAMIN 500 MCG TAB: 500 mcg | ORAL | Qty: 1

## 2018-06-08 MED FILL — CUBICIN RF 500 MG INTRAVENOUS SOLUTION: 500 mg | INTRAVENOUS | Qty: 1000

## 2018-06-08 MED FILL — FAMOTIDINE 20 MG TAB: 20 mg | ORAL | Qty: 1

## 2018-06-08 MED FILL — PIPERACILLIN-TAZOBACTAM 3.375 GRAM IV SOLR: 3.375 gram | INTRAVENOUS | Qty: 3.38

## 2018-06-08 MED FILL — HEPARIN (PORCINE) 5,000 UNIT/ML IJ SOLN: 5000 unit/mL | INTRAMUSCULAR | Qty: 1

## 2018-06-08 MED FILL — DULOXETINE 30 MG CAP, DELAYED RELEASE: 30 mg | ORAL | Qty: 2

## 2018-06-08 MED FILL — VANCOMYCIN 10 GRAM IV SOLR: 10 gram | INTRAVENOUS | Qty: 2500

## 2018-06-08 MED FILL — EFFER-K 20 MEQ EFFERVESCENT TABLET: 20 mEq | ORAL | Qty: 2

## 2018-06-08 MED FILL — LISINOPRIL 5 MG TAB: 5 mg | ORAL | Qty: 2

## 2018-06-08 MED FILL — CEFTRIAXONE 2 GRAM SOLUTION FOR INJECTION: 2 gram | INTRAMUSCULAR | Qty: 2

## 2018-06-08 MED FILL — TOTAL B/C TABLET: ORAL | Qty: 1

## 2018-06-08 MED FILL — FERROUS SULFATE 325 MG (65 MG ELEMENTAL IRON) TAB: 325 mg (65 mg iron) | ORAL | Qty: 1

## 2018-06-08 MED FILL — BYSTOLIC 5 MG TABLET: 5 mg | ORAL | Qty: 2

## 2018-06-08 NOTE — Discharge Summary (Addendum)
[place] [address]   Office [phone]  Fax [phone]       Discharge / Transfer / Off-Service Note     Name: Elijah [name] MRN: [id number]  Sex: Male   Date of Birth: 07-Aug-1950  Age: 68 y.o.  PCP: [name], MD     Date of admission: 06/02/2018  Date of discharge/transfer: 06/08/2018    Attending physician at admission: Dr. Mallie Mussel    Attending physician at discharge/transfer: Dr. Mallie Mussel    Resident physician at discharge/transfer: Marion Downer, MD, PGY1     Consultants during hospitalization  Dr. Manuella Ghazi (podiatry)  Dr. Garvin Fila (ID)     Admission diagnoses   Cellulitis [L03.90]  Right foot infection [L08.9]    Recommended follow-up after discharge  1. PCP   2. Podiatry    Things to follow up on with PCP:  Transition of care  CMP and Lipid profile in 6 weeks (after abx course)     MEDICATION CHANGES:  START taking:  - ferrous sulfate 325 mg tab twice a day  - lisinopril 10 mg tab daily    START IV antibiotics via PICC line as per Dr Garvin Fila recommendations   - daptomycin 1032m IV daily through 07/17/18  -Ceftriaxone 2gm IV daily through 07/17/18  ??  STOP taking:   - cephALEXin??500 mg capsule??(KEFLEX)??   - trimethoprim-sulfamethoxazole??160-800 mg per tablet??(BACTRIM DS, SEPTRA DS)??  ??  Continue all other medications as prescribed     History of Present Illness  Per admitting provider, "Elijah ROSSITTOis a 68y.o. male with known polyneuropathy, OSAS (CPAP), HTN HLD, CAD with CABG x5 in 2011, Obesity and s/p Gastric Sleeve in 01/2018  who presents to the ER complaining of Rt foot pain. He noticed a "cyst" 2 weeks ago.  A week after that it appeared to be punctured and started bleeding.  Denies No f/c, diaphoresis, n/v/d, chest pain, SOB, palpitations, dysuria, constipation, pain in lower extremities.  Endorses some swelling and redness.  Pt does not recall injuring his foot and usually wears his shoes all the time  except when he is in his bedroom. He has not taken an antibiotics for this.     ??  Also endorses significant fatigue x1 month.  States it has been present since he had Gastric Sleeve but feels that it has worsened in the past month.  He notes dizziness upon standing for the past month and had x1 fall 2/2 to this in which he hit his Rt forehead and required ~20 stiches.    ??  ??  In the Er:   vital signs were unremarkable   Labs were remarkable for elevated CRP to 5.4 and ESR elevated to 58.   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 No  acute fracture identified.."    HSherwoodis a 68y.o. male with a medical hx of polyneuropathy, OSAS (CPAP), HTN HLD, CAD with CABG x5 in 2011, Obesity and s/p Gastric Sleeve in 01/2018??admitted for R foot wound.   ??  Left foot osteomyelitis, abscess of right foot. S/p I&D of R foot and incision of L 5th digit and metatarsal . MRI left foot: Probable fifth MTP joint septic arthritis with osteomyelitis of the fifth  metatarsal head and likely fifth proximal phalanx. WCx from 3/6(final) staph aureus. Bone culture negative for 3 days. Blood cultures  negative for 5 days, bone pathology: benign bone. Per podiatry and ID recommendations patient is discharged with IV antibiotics for 6 weeks via PICC line.   - daptomycin 1027m IV daily through 07/17/18  - Ceftriaxone 2gm IV daily through 07/17/18    Hypotension in the setting of HTN: Hx of CABG 2011. Recent 70 Lb weight loss from gastric bypass. POA BP ??118/66. ??Iron profile, TSH and B12 from 3/2 wnl. Orthostatics negative. Echo nl cavity size. LVEF 589-93%  - Bystolic 122NHdaily   - lisinopril 10 mg tab daily  ??  S/p Gastric Sleeve: 70 lb wt loss since 11/19   -continue home B complex   -continue home B12??  - start ferrous sulfate 325 mg tab twice a day  ??  Heartburn: no Hx of GERD. Managed with Pepcid 20 daily  ??   Hyperlipidemia:??Last lipid panel on??1/20??and values were TC- 118, TG- 119, ??HDL-41, LDL-53,   -Continue home??Lipitor 489mdaily   ??  Polyneuropathy: chronic bilateral below the knee   -continue home Lyrica 30026mid   -continue home Cymbalta 65m55mce daily??  ??  Sleep Apnea:   -continue CPAP qhs??  ??  Obesity??BMI Body mass index is 38.1 kg/m??. Encouraged lifestyle modifications and further follow up outpatient.     Physical exam at discharge:    Visit Vitals  BP 155/83 (BP 1 Location: Left arm, BP Patient Position: At rest;Supine)   Pulse 67   Temp 98.7 ??F (37.1 ??C)   Resp 21   Ht 6' 1" (1.854 m)   Wt 288 lb 12.8 oz (131 kg)   SpO2 95%   BMI 38.10 kg/m??     Physical Exam  Constitutional:       General: He is not in acute distress.     Appearance: He is not ill-appearing.   Cardiovascular:      Rate and Rhythm: Normal rate and regular rhythm.      Heart sounds: No murmur.   Pulmonary:      Effort: Pulmonary effort is normal.      Breath sounds: No wheezing.   Musculoskeletal:         General: No swelling.      Comments: Wounds are covered by dressing     Condition at discharge: Stable.    Labs  Recent Labs     06/08/18  0116 06/07/18  0359 06/06/18  0532   WBC 5.7 4.7 4.3   HGB 11.0* 11.1* 10.6*   HCT 33.2* 33.7* 31.9*   PLT 241 223 207     Recent Labs     06/08/18  0116 06/07/18  0359 06/06/18  0532   NA 144 143 142   K 3.4* 3.5 3.6   CL 112* 112* 111*   CO2 _0 BUN _1 CREA 1.08 0.90 0.96   GLU 80 79 78   CA 8.2* 8.2* 8.3*   MG 2.3 2.2 2.2   PHOS 3.6 3.8 3.3     No results for input(s): SGOT, GPT, ALT, AP, TBIL, TBILI, TP, ALB, GLOB, GGT, AML, LPSE in the last 72 hours.    No lab exists for component: AMYP, HLPSE  No results for input(s): PH, PCO2, PO2, TNIPOC, TROIQ, INR, PTP, APTT, FE, TIBC, PSAT, FERR, GLUCPOC, INREXT, INREXT in the last 72 hours.    No lab exists for component: GLPOC  Cultures  ?? Wound and abscess cultures: Cultures are growing group B streptococcus  and methicillin-sensitive Staphylococcus aureus.  ?? Bone tissue cultures: NGTD    Procedures / Diagnostic Studies    Imaging  Results from Hospital Encounter encounter on 2018/06/30   XR US GUIDED VASCULAR ACCESS    Narrative INDICATION:   NO VENOUS ACCESS      EXAMINATION:  US GUIDE VAS ACCESS     FINDINGS: Ultrasound was provided for PICC line placement.      Impression IMPRESSION:    Ultrasound guidance for PICC line  placement.     GBP          No results found for this or any previous visit.         No results found for this or any previous visit.         No procedure found.      Chronic diagnoses   Problem List as of 06/08/2018 Date Reviewed: 06-30-2018          Codes Class Noted - Resolved    Abscess of right foot ICD-10-CM: L02.611  ICD-9-CM: 682.7  06/04/2018 - Present        Acute osteomyelitis of left foot ([place]) ICD-10-CM: M86.172  ICD-9-CM: 730.07  06/04/2018 - Present        Cellulitis ICD-10-CM: L03.90  ICD-9-CM: 682.9  06/30/18 - Present        * (Principal) Right foot infection ICD-10-CM: L08.9  ICD-9-CM: 686.9  06-30-2018 - Present        Complications of gastric bypass surgery ICD-10-CM: K91.89, Y83.2  ICD-9-CM: 997.49, E878.2  05/22/2018 - Present        Chronic fatigue ICD-10-CM: R53.82  ICD-9-CM: 780.79  09/08/2017 - Present        Polyneuropathy (Chronic) ICD-10-CM: G62.9  ICD-9-CM: 356.9  05/28/2016 - Present        Tubular adenoma of colon ICD-10-CM: D12.6  ICD-9-CM: 211.3  09/23/2015 - Present    Overview Signed 09/23/2015  4:44 PM by Mayer Camel, MD     x2 on colonoscopy 09/18/15             Obesity ICD-10-CM: Y56.3  ICD-9-CM: 278.00  04/24/2015 - Present        Essential hypertension, benign (Chronic) ICD-10-CM: I10  ICD-9-CM: 401.1  02/21/2015 - Present    Overview Signed 08/10/2016  3:46 PM by Margarette Canada, LPN     Overview:   Qualifier: Diagnosis of   By: Rogue Bussing CMA, Jacqualynn              HLD (hyperlipidemia) (Chronic) ICD-10-CM: E78.5  ICD-9-CM: 272.4  02/21/2015 - Present         Coronary atherosclerosis of native coronary artery (Chronic) ICD-10-CM: I25.10  ICD-9-CM: 414.01  02/21/2015 - Present    Overview Addendum 08/10/2016  3:46 PM by Margarette Canada, LPN     S/p CABG x5. DOE was presenting complaint.             OSA on CPAP (Chronic) ICD-10-CM: G47.33, Z99.89  ICD-9-CM: 327.23, V46.8  02/21/2015 - Present        Pure hypercholesterolemia ICD-10-CM: E78.00  ICD-9-CM: 272.0  08/23/2013 - Present        S/P CABG (coronary artery bypass graft) ICD-10-CM: Z95.1  ICD-9-CM: V45.81  08/23/2013 - Present    Overview Signed 08/10/2016  3:46 PM by Margarette Canada, LPN     Overview:   8937             Benign prostatic hyperplasia ICD-10-CM: N40.0  ICD-9-CM: 600.00  12/29/2006 - Present    Overview Signed 08/10/2016  3:46 PM by Margarette Canada, LPN     Overview:   Qualifier: Diagnosis of   By: Rogue Bussing CMA, Jacqualynn              RESOLVED: Subacute osteomyelitis of right foot ([place]) ICD-10-CM: H68.616  ICD-9-CM: 730.07  12/09/2016 - 01/04/2018        RESOLVED: Osteomyelitis ([place]) ICD-10-CM: M86.9  ICD-9-CM: 730.20  09/03/2016 - 01/04/2018              Discharge/Transfer Medications  Current Discharge Medication List      START taking these medications    Details   ferrous sulfate 325 mg (65 mg iron) tablet Take 1 Tab by mouth two (2) times daily (with meals).  Qty: 60 Tab, Refills: 0         CONTINUE these medications which have CHANGED    Details   lisinopriL (PRINIVIL, ZESTRIL) 10 mg tablet Take 1 Tab by mouth daily.  Qty: 30 Tab, Refills: 0         CONTINUE these medications which have NOT CHANGED    Details   atorvastatin (LIPITOR) 80 mg tablet Take 80 mg by mouth daily.      OTHER,NON-FORMULARY, [employer] children's chewable vitamins - 2 tablets daily      b complex vitamins tablet Take 1 Tab by mouth daily.      diclofenac (VOLTAREN) 1 % gel Apply 2 g to affected area four (4) times daily as needed for Pain (Knee pain).      BYSTOLIC 10 mg tablet TAKE 1 TABLET BY MOUTH EVERY DAY  Qty: 90 Tab, Refills: 2       DULoxetine (CYMBALTA) 60 mg capsule TAKE 1 CAPSULE BY MOUTH DAILY  Qty: 90 Cap, Refills: 2      pregabalin (LYRICA) 300 mg capsule TAKE 1 CAPSULE BY MOUTH TWICE A DAY  Qty: 60 Cap, Refills: 2    Associated Diagnoses: DDD (degenerative disc disease), lumbar; Neuropathic pain, leg, left      cyanocobalamin (VITAMIN B12) 500 mcg tablet Take 1 Tab by mouth daily.  Qty: 90 Tab, Refills: 3    Associated Diagnoses: Neuropathy      co-enzyme Q-10 (CO Q-10) 100 mg capsule Take 100 mg by mouth daily.      cpap machine kit by Does Not Apply route.         STOP taking these medications       trimethoprim-sulfamethoxazole (BACTRIM DS, SEPTRA DS) 160-800 mg per tablet Comments:   Reason for Stopping:         cephALEXin (KEFLEX) 500 mg capsule Comments:   Reason for Stopping:                Diet:  Regular diet.    Activity:  As tolerated    Disposition: [facility]    Discharge instructions to patient/family  Please seek medical attention for any new or worsening symptoms particularly fever, chest pain, shortness of breath, abdominal pain, nausea, vomiting    Follow up plans/appointments  Follow-up Information     Follow up With Specialties Details Why Contact Info    [name], MD Family Practice On 06/09/2018 at 2:15pm for hospital follow up [address]  [place] [address]  [phone]             Marion Downer, MD  Family Medicine Resident       For Billing  Chief Complaint   Patient presents with   ??? Fatigue   ??? Foot Problem       Hospital Problems  Date Reviewed: 2018-06-20          Codes Class Noted POA    Abscess of right foot ICD-10-CM: L02.611  ICD-9-CM: 682.7  06/04/2018 Unknown        Acute osteomyelitis of left foot ([place]) ICD-10-CM: M86.172  ICD-9-CM: 730.07  06/04/2018 Unknown        Cellulitis ICD-10-CM: L03.90  ICD-9-CM: 682.9  Jun 20, 2018 Unknown        * (Principal) Right foot infection ICD-10-CM: L08.9  ICD-9-CM: 686.9  20-Jun-2018 Unknown        Polyneuropathy (Chronic) ICD-10-CM: G62.9   ICD-9-CM: 356.9  05/28/2016 Yes        Obesity ICD-10-CM: E66.9  ICD-9-CM: 278.00  04/24/2015 Unknown        Essential hypertension, benign (Chronic) ICD-10-CM: I10  ICD-9-CM: 401.1  02/21/2015 Yes    Overview Signed 08/10/2016  3:46 PM by Margarette Canada, LPN     Overview:   Qualifier: Diagnosis of   By: Rogue Bussing CMA, Jacqualynn              HLD (hyperlipidemia) (Chronic) ICD-10-CM: E78.5  ICD-9-CM: 272.4  02/21/2015 Yes        Coronary atherosclerosis of native coronary artery (Chronic) ICD-10-CM: I25.10  ICD-9-CM: 414.01  02/21/2015 Yes    Overview Addendum 08/10/2016  3:46 PM by Margarette Canada, LPN     S/p CABG x5. DOE was presenting complaint.             OSA on CPAP (Chronic) ICD-10-CM: V37.48, Z99.89  ICD-9-CM: 327.23, V46.8  02/21/2015 Yes        S/P CABG (coronary artery bypass graft) ICD-10-CM: Z95.1  ICD-9-CM: V45.81  08/23/2013 Yes    Overview Signed 08/10/2016  3:46 PM by Margarette Canada, LPN     Overview:   2707

## 2018-06-08 NOTE — Progress Notes (Addendum)
1310:  [name] has accepted the pt.  Lorraine (intake) stated [facility] needs to order all the wound care supplies that the pt will need and have them delivered to his home.  [facility] notified that nursing is in place and confirmed nursing will see the pt tomorrow for teaching.    [facility] will deliver abx to pt's home today.  L.Neely, RN    11:30:  Call rec'd from [name] intake who stated they are reviewing the referral.  She will let me know what is decided.  Pt was updated.    CM Note:  RUR: 14-medium  Transition of Care Plan:??????????  1. IV abx--for home--pt set up with [facility] in [place]  2. A referral was faxed to [name]; PICC report, abx orders, wound care and home health orders sent to [phone].  Agency was called; no answer; voice message left to call me back  3 ??Pt has arranged transport home  4. Pt to purchase a RW for himself  L.Neely, RN

## 2018-06-08 NOTE — Progress Notes (Signed)
[facility] [place] [facility] Progress Note           Ubaldo Glassing DO    [phone] Office  [phone]  Fax    06/08/2018      Assessment & Plan:   1.  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 The patient is status post incision and drainage with bone biopsy. Bone biopsy cultures reveal no growth to date. Personal review of MRI images with radiology shows undeniable signs of osteomyelitis despite negative pathology. Have discussed case with patient. Believe it would be prudent to give 6 weeks of IV Daptomycin and ceftriaxone. IV abx orders in chart  2.  Right foot abscess - The patient is status post incision and drainage on 03/08.  Cultures are growing group B streptococcus and methicillin-sensitive Staphylococcus aureus.  No osteomyelitis was noted on MRI.  Continue antibiotics as above.  3.  Morbid obesity - The patient is status post gastric sleeve surgery. 1.           Subjective:     No complaints    Objective:     Vitals:   Visit Vitals  BP 155/83 (BP 1 Location: Left arm, BP Patient Position: At rest;Supine)   Pulse (!) 57   Temp 98.7 ??F (37.1 ??C)   Resp 21   Ht 6\' 1"  (1.854 m)   Wt 288 lb 12.8 oz (131 kg)   SpO2 95%   BMI 38.10 kg/m??        Tmax:  Temp (24hrs), Avg:98.2 ??F (36.8 ??C), Min:97.4 ??F (36.3 ??C), Max:98.7 ??F (37.1 ??C)      Exam:   Patient is intubated:  no    Physical Examination:   General:  Alert, cooperative, no distress   Head:     Eyes:     Neck:        Lungs:   No distress.    Chest wall:     Heart:     Abdomen:      Extremities: Moves all.    Skin: Feet dressed   Neurologic: CNII-XII intact.      Labs:        No lab exists for component: ITNL   No results for input(s): CPK, CKMB, TROIQ in the last 72 hours.  Recent Labs     06/08/18  0116 06/07/18  0359 06/06/18  0532   NA 144 143 142   K 3.4* 3.5 3.6   CL 112* 112* 111*   CO2 27 25 24     BUN 10 7 6    CREA 1.08 0.90 0.96   GLU 80 79 78   PHOS 3.6 3.8 3.3   MG 2.3 2.2 2.2   WBC 5.7 4.7 4.3   HGB 11.0* 11.1* 10.6*   HCT 33.2* 33.7* 31.9*   PLT 241 223 207     No results for input(s): INR, PTP, APTT, INREXT in the last 72 hours.  Needs: urine analysis, urine sodium, protein and creatinine  No results found for: NAU, CREAU      Cultures:     No results found for: SDES  Lab Results   Component Value Date/Time    Culture result: LIGHT STAPHYLOCOCCUS AUREUS (A) 06/04/2018 10:30 AM    Culture result: (A) 06/04/2018 10:30 AM     LIGHT STREPTOCOCCI, BETA HEMOLYTIC GROUP B . Penicillin and ampicillin are drugs  of choice for treatment of beta-hemolytic streptococcal infections. Susceptibility testing of penicillins and beta-lactams approved by the FDA for treatment of beta-hemolytic streptococcal infections need not be performed routinely, because nonsusceptible isolates are extremely rare. CLSI 2012    Culture result: NO ANAEROBES ISOLATED 06/04/2018 10:30 AM       Radiology:     Medications       Current Facility-Administered Medications   Medication Dose Route Frequency Last Dose   ??? artificial tears (dextran 70-hypromellose) (NATURAL BALANCE) 0.1-0.3 % ophthalmic solution 2 Drop  2 Drop Both Eyes PRN 2 Drop at 06/07/18 1732   ??? vancomycin (VANCOCIN) 2,500 mg in 0.9% sodium chloride 500 mL IVPB  2,500 mg IntraVENous Q24H 2,500 mg at 06/07/18 1208   ??? lisinopriL (PRINIVIL, ZESTRIL) tablet 10 mg  10 mg Oral DAILY 10 mg at 06/08/18 0809   ??? alteplase (CATHFLO) 1 mg in sterile water (preservative free) 1 mL injection  1 mg InterCATHeter PRN     ??? atorvastatin (LIPITOR) tablet 80 mg  80 mg Oral QHS 80 mg at 06/07/18 2154   ??? nebivoloL (BYSTOLIC) tablet 10 mg  10 mg Oral DAILY 10 mg at 06/08/18 0810   ??? famotidine (PEPCID) tablet 20 mg  20 mg Oral BID 20 mg at 06/08/18 0810   ??? ferrous sulfate tablet 325 mg  1 Tab Oral BID WITH MEALS 325 mg at 06/08/18 0810    ??? heparin (porcine) injection 5,000 Units  5,000 Units SubCUTAneous Q8H 5,000 Units at 06/08/18 0810   ??? sodium chloride (NS) flush 5-40 mL  5-40 mL IntraVENous Q8H 10 mL at 06/07/18 1520   ??? sodium chloride (NS) flush 5-40 mL  5-40 mL IntraVENous PRN     ??? acetaminophen (TYLENOL) tablet 650 mg  650 mg Oral Q4H PRN     ??? piperacillin-tazobactam (ZOSYN) 3.375 g in 0.9% sodium chloride (MBP/ADV) 100 mL  3.375 g IntraVENous Q8H 3.375 g at 06/08/18 0623   ??? b-complex with vitamin c tablet 1 Tab  1 Tab Oral DAILY 1 Tab at 06/08/18 0810   ??? cyanocobalamin (VITAMIN B12) tablet 500 mcg  500 mcg Oral DAILY 500 mcg at 06/08/18 0810   ??? DULoxetine (CYMBALTA) capsule 60 mg  60 mg Oral DAILY 60 mg at 06/08/18 0810   ??? pregabalin (LYRICA) capsule 300 mg  300 mg Oral BID 300 mg at 06/08/18 0300           Case discussed with:      Ubaldo Glassing, DO

## 2018-06-08 NOTE — Progress Notes (Signed)
Bedside shift change report given to Asha-RN (oncoming nurse) by Jerson Furukawa-RN (offgoing nurse). Report included the following information SBAR and MAR.

## 2018-06-08 NOTE — Other (Signed)
I have reviewed discharge instructions with the patient.  The patient verbalized understanding.        Paper copy of AVS given to patient along with 2 prescriptions. PICC line left in for cont home IV abx. Patient dressed self. Patient taken down via wheelchair and left in personal vehicle with friend.

## 2018-06-08 NOTE — Progress Notes (Signed)
Bedside shift change report given to Asha-RN (oncoming nurse) by Sherry-RN (offgoing nurse). Report included the following information SBAR and MAR.

## 2018-06-08 NOTE — Progress Notes (Signed)
[facility] [place] [facility] Progress Note           Ubaldo Glassing DO    [phone] Office  [phone]  Fax    06/08/2018      Assessment & Plan:   1.  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 The patient is status post incision and drainage with bone biopsy. Bone biopsy cultures reveal no growth to date. Personal review of MRI images with radiology shows undeniable signs of osteomyelitis despite negative pathology. Have discussed case with patient. Believe it would be prudent to give 6 weeks of IV Daptomycin and ceftriaxone. IV abx orders in chart  2.  Right foot abscess - The patient is status post incision and drainage on 03/08.  Cultures are growing group B streptococcus and methicillin-sensitive Staphylococcus aureus.  No osteomyelitis was noted on MRI.  Continue antibiotics as above.  3.  Morbid obesity - The patient is status post gastric sleeve surgery. 1.           Subjective:     No complaints    Objective:     Vitals:   Visit Vitals  BP 155/83 (BP 1 Location: Left arm, BP Patient Position: At rest;Supine)   Pulse (!) 57   Temp 98.7 ??F (37.1 ??C)   Resp 21   Ht 6\' 1"  (1.854 m)   Wt 288 lb 12.8 oz (131 kg)   SpO2 95%   BMI 38.10 kg/m??        Tmax:  Temp (24hrs), Avg:98.2 ??F (36.8 ??C), Min:97.4 ??F (36.3 ??C), Max:98.7 ??F (37.1 ??C)      Exam:   Patient is intubated:  no    Physical Examination:   General:  Alert, cooperative, no distress   Head:     Eyes:     Neck:        Lungs:   No distress.    Chest wall:     Heart:     Abdomen:      Extremities: Moves all.    Skin: Feet dressed   Neurologic: CNII-XII intact.      Labs:        No lab exists for component: ITNL   No results for input(s): CPK, CKMB, TROIQ in the last 72 hours.  Recent Labs     06/08/18  0116 06/07/18  0359 06/06/18  0532   NA 144 143 142   K 3.4* 3.5 3.6   CL 112* 112* 111*   CO2 27 25 24    BUN 10 7  6    CREA 1.08 0.90 0.96   GLU 80 79 78   PHOS 3.6 3.8 3.3   MG 2.3 2.2 2.2   WBC 5.7 4.7 4.3   HGB 11.0* 11.1* 10.6*   HCT 33.2* 33.7* 31.9*   PLT 241 223 207     No results for input(s): INR, PTP, APTT, INREXT in the last 72 hours.  Needs: urine analysis, urine sodium, protein and creatinine  No results found for: NAU, CREAU      Cultures:     No results found for: SDES  Lab Results   Component Value Date/Time    Culture result: LIGHT STAPHYLOCOCCUS AUREUS (A) 06/04/2018 10:30 AM    Culture result: (A) 06/04/2018 10:30 AM     LIGHT STREPTOCOCCI, BETA HEMOLYTIC GROUP B . Penicillin and ampicillin are drugs  of choice for treatment of beta-hemolytic streptococcal infections. Susceptibility testing of penicillins and beta-lactams approved by the FDA for treatment of beta-hemolytic streptococcal infections need not be performed routinely, because nonsusceptible isolates are extremely rare. CLSI 2012    Culture result: NO ANAEROBES ISOLATED 06/04/2018 10:30 AM       Radiology:     Medications       Current Facility-Administered Medications   Medication Dose Route Frequency Last Dose   ??? artificial tears (dextran 70-hypromellose) (NATURAL BALANCE) 0.1-0.3 % ophthalmic solution 2 Drop  2 Drop Both Eyes PRN 2 Drop at 06/07/18 1732   ??? vancomycin (VANCOCIN) 2,500 mg in 0.9% sodium chloride 500 mL IVPB  2,500 mg IntraVENous Q24H 2,500 mg at 06/07/18 1208   ??? lisinopriL (PRINIVIL, ZESTRIL) tablet 10 mg  10 mg Oral DAILY 10 mg at 06/08/18 0809   ??? alteplase (CATHFLO) 1 mg in sterile water (preservative free) 1 mL injection  1 mg InterCATHeter PRN     ??? atorvastatin (LIPITOR) tablet 80 mg  80 mg Oral QHS 80 mg at 06/07/18 2154   ??? nebivoloL (BYSTOLIC) tablet 10 mg  10 mg Oral DAILY 10 mg at 06/08/18 0810   ??? famotidine (PEPCID) tablet 20 mg  20 mg Oral BID 20 mg at 06/08/18 0810   ??? ferrous sulfate tablet 325 mg  1 Tab Oral BID WITH MEALS 325 mg at 06/08/18 0810   ??? heparin (porcine) injection 5,000 Units  5,000 Units  SubCUTAneous Q8H 5,000 Units at 06/08/18 0810   ??? sodium chloride (NS) flush 5-40 mL  5-40 mL IntraVENous Q8H 10 mL at 06/07/18 1520   ??? sodium chloride (NS) flush 5-40 mL  5-40 mL IntraVENous PRN     ??? acetaminophen (TYLENOL) tablet 650 mg  650 mg Oral Q4H PRN     ??? piperacillin-tazobactam (ZOSYN) 3.375 g in 0.9% sodium chloride (MBP/ADV) 100 mL  3.375 g IntraVENous Q8H 3.375 g at 06/08/18 0623   ??? b-complex with vitamin c tablet 1 Tab  1 Tab Oral DAILY 1 Tab at 06/08/18 0810   ??? cyanocobalamin (VITAMIN B12) tablet 500 mcg  500 mcg Oral DAILY 500 mcg at 06/08/18 0810   ??? DULoxetine (CYMBALTA) capsule 60 mg  60 mg Oral DAILY 60 mg at 06/08/18 0810   ??? pregabalin (LYRICA) capsule 300 mg  300 mg Oral BID 300 mg at 06/08/18 0375           Case discussed with:      Ubaldo Glassing, DO

## 2018-06-08 NOTE — Progress Notes (Signed)
 1310:  [name] has accepted the pt.  Lorraine (intake) stated [facility] needs to order all the wound care supplies that the pt will need and have them delivered to his home.  [facility] notified that nursing is in place and confirmed nursing will see the pt tomorrow for teaching.    [facility] will deliver abx to pt's home today.  L.Neely, RN    11:30:  Call rec'd from [name] intake who stated they are reviewing the referral.  She will let me know what is decided.  Pt was updated.    CM Note:  RUR: 14-medium  Transition of Care Plan:  1. IV abx--for home--pt set up with [facility] in [place]  2. A referral was faxed to [name]; PICC report, abx orders, wound care and home health orders sent to [phone].  Agency was called; no answer; voice message left to call me back  3 Pt has arranged transport home  4. Pt to purchase a RW for himself  L.Erline, RN

## 2018-06-08 NOTE — Discharge Summary (Signed)
Discharge Summary by Marion Downer, MD at 06/08/18 [phone]                Author: Marion Downer, MD  Service: FAMILY MEDICINE  Author Type: Resident       Filed: 06/08/18 1221  Date of Service: 06/08/18 0752  Status: Attested Addendum          Editor: Marion Downer, MD (Resident)       Related Notes: Original Note by Marion Downer, MD (Resident) filed at 06/08/18 [phone]          Cosigner: Oren Bracket, MD at 06/09/18 [phone]          Attestation signed by Oren Bracket, MD at 06/09/18 [facility] Attending Addendum:       I was NOT present with the resident during the interview & examination of the patient. I personally interviewed the patient & repeated the critical or key portions of the exam.  I agree with the resident's note with the following additions:       68 y.o. male who  has a past medical history of BPH (benign prostatic hyperplasia), CAD (coronary artery disease), History of vascular access device (09/08/2016), HLD (hyperlipidemia), HTN (hypertension), Morbid obesity due to excess calories ([place])  (04/24/2015), OSA (obstructive sleep apnea), Polyneuropathy, and Tubular adenoma of colon. admitted for RLE cellulitis and bilateral foot ulcers.       Visit Vitals   BP  155/83 (BP 1 Location: Left arm, BP Patient Position: At rest;Supine)      Pulse  (!) 57      Temp  98.7 ??F (37.1 ??C)      Resp  21      Ht  '6\' 1"'  (1.854 m)      Wt  288 lb 12.8 oz (131 kg)      SpO2  95%      BMI  38.10 kg/m??         A&Ox3, pleasant and appropriate affect   Resting in bed, appears comfortable       1.  Bilateral foot ulcers, left foot osteomyelitis: Podiatry following, s/p I&D of R foot and incisional bx of left foot 5th digit and metatarsal on 3/8. Apprec podiatry and  ID rec - home on dapto and ctx through 07/17/18. S/p PICC placement yesterday. [facility] arranged for DC home, apprec CM assistance.   2.  HTN/CAD: BP now elevated.  Improved and stable for discharge. Continue nebivolol 92m daily and lisinopril 189mdaily. FU with PCP.       See resident note for detailed assessment and plan.       ElOren BracketMD    Pt seen and examined on 06/08/2018                                           [id number]  Office [phone] Fax ([phone]           Discharge / Transfer / Off-Service Note            Name: Elijah REINIGMRN: [id number] Sex: Male         Date of Birth: 9/02-18-1952 Age: [age].o.   PCP:  [name], MD        Date of admission: 06/02/2018   Date of discharge/transfer: 06/08/2018      Attending physician at admission: Dr. Mallie Mussel      Attending physician at discharge/transfer: Dr. Mallie Mussel      Resident physician at discharge/transfer: Marion Downer, MD , PGY1       Consultants during hospitalization   Dr. Manuella Ghazi (podiatry)   Dr. Garvin Fila (ID)       Admission diagnoses    Cellulitis [L03.90]   Right foot infection [L08.9]      Recommended follow-up after discharge   1. PCP    2. Podiatry      Things to follow up on with PCP:   Transition of care   CMP and Lipid profile in 6 weeks (after abx course)       MEDICATION CHANGES:   START taking:   - ferrous sulfate 325 mg tab twice a day   - lisinopril 10 mg tab daily      START IV antibiotics via PICC line as per Dr Garvin Fila recommendations    - daptomycin 1052m IV daily through 07/17/18   -Ceftriaxone 2gm IV daily through 07/17/18   ??   STOP taking:    - cephALEXin??500 mg capsule??(KEFLEX)??    - trimethoprim-sulfamethoxazole??160-800 mg per tablet??(BACTRIM DS, SEPTRA DS)??   ??   Continue all other medications as prescribed       History of Present Illness   Per admitting provider, "Elijah TOMKOis a 68y.o. male with known polyneuropathy, OSAS (CPAP), HTN HLD, CAD with CABG x5 in 2011, Obesity and s/p Gastric Sleeve in 01/2018  who  presents to the ER complaining of Rt foot pain. He noticed a "cyst" 2 weeks ago.   A week after that it appeared to be punctured and started bleeding.  Denies No f/c, diaphoresis, n/v/d, chest pain, SOB, palpitations, dysuria, constipation, pain in  lower extremities.  Endorses some swelling and redness.  Pt does not recall injuring his foot and usually wears his shoes all the time except when he is in his bedroom. He has not taken an antibiotics for this.      ??   Also endorses significant fatigue x1 month.  States it has been present since he had Gastric Sleeve but feels that it has worsened in the past month.  He notes dizziness upon standing for the past month and had x1 fall 2/2 to this in which he hit his  Rt forehead and required ~20 stiches.     ??   ??   In the Er:    vital signs were unremarkable    Labs were remarkable for elevated CRP to 5.4 and ESR elevated to 58.    XR Rt foot    IMPRESSION: Deformity of the right first and second MTP joints is unchanged. No   acute fracture identified.."      HTamais a 68y.o. male with a medical hx of polyneuropathy, OSAS (CPAP), HTN HLD, CAD with CABG x5 in 2011, Obesity and s/p Gastric Sleeve in 01/2018??admitted  for R foot wound.    ??   Left foot osteomyelitis, abscess of right foot. S/p I&D of R foot and incision of L 5th digit and metatarsal . MRI left foot: Probable fifth  MTP joint septic arthritis with osteomyelitis of the fifth   metatarsal head and likely fifth proximal  phalanx. WCx from 3/6(final) staph aureus. Bone culture negative for 3 days. Blood cultures negative for 5 days, bone pathology: benign  bone. Per podiatry and ID recommendations patient is discharged with IV antibiotics for 6 weeks via PICC line.    - daptomycin 1073m IV daily through 07/17/18   - Ceftriaxone 2gm IV daily through 07/17/18      Hypotension in the setting of HTN: Hx of CABG 2011. Recent 70 Lb weight loss from gastric bypass. POA BP ??118/66. ??Iron profile, TSH  and B12 from 3/2 wnl. Orthostatics negative. Echo nl cavity size.  LVEF 551-76%   - Bystolic 116WVdaily    - lisinopril 10 mg tab daily   ??   S/p Gastric Sleeve: 70 lb wt loss since 11/19    -continue home B complex    -continue home B12??   - start ferrous sulfate 325 mg tab twice a day   ??   Heartburn: no Hx of GERD. Managed with Pepcid 20 daily   ??   Hyperlipidemia:??Last lipid panel on??1/20??and values were TC- 118, TG- 119, ??HDL-41, LDL-53,    -Continue home??Lipitor 440mdaily    ??   Polyneuropathy: chronic bilateral below the knee    -continue home Lyrica 30017mid    -continue home Cymbalta 103m23mce daily??   ??   Sleep Apnea:    -continue CPAP qhs??   ??   Obesity??BMI Body mass index is 38.1 kg/m??. Encouraged lifestyle modifications and further follow up outpatient.       Physical exam at discharge:      Visit Vitals      BP  155/83 (BP 1 Location: Left arm, BP Patient Position: At rest;Supine)     Pulse  67     Temp  98.7 ??F (37.1 ??C)     Resp  21     Ht  '6\' 1"'  (1.854 m)     Wt  288 lb 12.8 oz (131 kg)     SpO2  95%        BMI  38.10 kg/m??        Physical Exam   Constitutional:        General: He is not in acute distress.     Appearance: He is not ill-appearing.   Cardiovascular:       Rate and Rhythm: Normal rate and regular rhythm.      Heart sounds: No murmur.   Pulmonary:       Effort: Pulmonary effort is normal.      Breath sounds: No wheezing.   Musculoskeletal:          General: No swelling.      Comments: Wounds are covered by dressing       Condition at discharge: Stable.      Labs     Recent Labs             06/08/18   0116  06/07/18   0359  06/06/18   0532     WBC  5.7  4.7  4.3     HGB  11.0*  11.1*  10.6*     HCT  33.2*  33.7*  31.9*          PLT  241  223  207          Recent Labs             06/08/18   0116  06/07/18   0359  06/06/18  0532     NA  144  143  142     K  3.4*  3.5  3.6     CL  112*  112*  111*     CO2  '27  25  24     ' BUN  '10  7  6     ' CREA  1.08  0.90  0.96     GLU  80  79  78     CA  8.2*  8.2*  8.3*     MG  2.3  2.2  2.2          PHOS  3.6  3.8   3.3        No results for input(s): SGOT, GPT, ALT, AP, TBIL, TBILI, TP, ALB, GLOB, GGT, AML, LPSE in the last 72 hours.      No lab exists for component: AMYP, HLPSE   No results for input(s): PH, PCO2, PO2, TNIPOC, TROIQ, INR, PTP, APTT, FE, TIBC, PSAT, FERR, GLUCPOC, INREXT, INREXT in the last 72 hours.      No lab exists for component: GLPOC   Cultures   ??  Wound and abscess cultures: Cultures are growing group B streptococcus and methicillin-sensitive Staphylococcus aureus.   ??  Bone tissue cultures: NGTD      Procedures / Diagnostic Studies      Imaging     Results from Hospital Encounter encounter on 06-12-2018     XR US GUIDED VASCULAR ACCESS           Narrative  INDICATION:   NO VENOUS ACCESS        EXAMINATION:  US GUIDE VAS ACCESS       FINDINGS: Ultrasound was provided for PICC line placement.              Impression  IMPRESSION:      Ultrasound guidance for PICC line  placement.       GBP             No results found for this or any previous visit.          No results found for this or any previous visit.          No procedure found.         Chronic diagnoses       Problem List  as of 06/08/2018  Date Reviewed:  06/12/2018                        Codes  Class  Noted - Resolved             Abscess of right foot  ICD-10-CM: L02.611   ICD-9-CM: 682.7    06/04/2018 - Present                       Acute osteomyelitis of left foot ([place])  ICD-10-CM: M86.172   ICD-9-CM: 730.07    06/04/2018 - Present                       Cellulitis  ICD-10-CM: L03.90   ICD-9-CM: 682.9    12-Jun-2018 - Present                       * (Principal) Right foot infection  ICD-10-CM: L08.9   ICD-9-CM: 686.9    12-Jun-2018 - Present  Complications of gastric bypass surgery  ICD-10-CM: K91.89, Y83.2   ICD-9-CM: 997.49, E878.2    05/22/2018 - Present                       Chronic fatigue  ICD-10-CM: R53.82   ICD-9-CM: 780.79    09/08/2017 - Present                       Polyneuropathy (Chronic)  ICD-10-CM: G62.9   ICD-9-CM: 356.9     05/28/2016 - Present                       Tubular adenoma of colon  ICD-10-CM: D12.6   ICD-9-CM: 211.3    09/23/2015 - Present          Overview Signed 09/23/2015  4:44 PM by Mayer Camel, MD            x2 on colonoscopy 09/18/15                                   Obesity  ICD-10-CM: X32.3   ICD-9-CM: 278.00    04/24/2015 - Present                       Essential hypertension, benign (Chronic)  ICD-10-CM: I10   ICD-9-CM: 401.1    02/21/2015 - Present          Overview Signed 08/10/2016  3:46 PM by Margarette Canada, LPN            Overview:    Qualifier: Diagnosis of    By: Rogue Bussing CMA, Jacqualynn                                    HLD (hyperlipidemia) (Chronic)  ICD-10-CM: E78.5   ICD-9-CM: 272.4    02/21/2015 - Present                       Coronary atherosclerosis of native coronary artery (Chronic)  ICD-10-CM: I25.10   ICD-9-CM: 414.01    02/21/2015 - Present          Overview Addendum 08/10/2016  3:46 PM by Margarette Canada, LPN            S/p CABG x5. DOE was presenting complaint.                                   OSA on CPAP (Chronic)  ICD-10-CM: G47.33, Z99.89   ICD-9-CM: 327.23, V46.8    02/21/2015 - Present                       Pure hypercholesterolemia  ICD-10-CM: E78.00   ICD-9-CM: 272.0    08/23/2013 - Present                       S/P CABG (coronary artery bypass graft)  ICD-10-CM: Z95.1   ICD-9-CM: V45.81    08/23/2013 - Present          Overview Signed 08/10/2016  3:46 PM by Margarette Canada, LPN            Overview:    2009  Benign prostatic hyperplasia  ICD-10-CM: N40.0   ICD-9-CM: 600.00    12/29/2006 - Present          Overview Signed 08/10/2016  3:46 PM by Margarette Canada, LPN            Overview:    Qualifier: Diagnosis of    By: Rogue Bussing CMA, Jacqualynn                                    RESOLVED: Subacute osteomyelitis of right foot ([place])  ICD-10-CM: U44.034   ICD-9-CM: 730.07    12/09/2016 - 01/04/2018                       RESOLVED: Osteomyelitis ([place])  ICD-10-CM: M86.9    ICD-9-CM: 730.20    09/03/2016 - 01/04/2018                          Discharge/Transfer Medications     Current Discharge Medication List              START taking these medications          Details        ferrous sulfate 325 mg (65 mg iron) tablet  Take 1 Tab by mouth two (2) times daily (with meals).   Qty: 60 Tab, Refills:  0                     CONTINUE these medications which have CHANGED          Details        lisinopriL (PRINIVIL, ZESTRIL) 10 mg tablet  Take 1 Tab by mouth daily.   Qty: 30 Tab, Refills:  0                     CONTINUE these medications which have NOT CHANGED          Details        atorvastatin (LIPITOR) 80 mg tablet  Take 80 mg by mouth daily.               OTHER,NON-FORMULARY,  [place] children's chewable vitamins - 2 tablets daily               b complex vitamins tablet  Take 1 Tab by mouth daily.               diclofenac (VOLTAREN) 1 % gel  Apply 2 g to affected area four (4) times daily as needed for Pain (Knee pain).               BYSTOLIC 10 mg tablet  TAKE 1 TABLET BY MOUTH EVERY DAY   Qty: 90 Tab, Refills:  2               DULoxetine (CYMBALTA) 60 mg capsule  TAKE 1 CAPSULE BY MOUTH DAILY   Qty: 90 Cap, Refills:  2               pregabalin (LYRICA) 300 mg capsule  TAKE 1 CAPSULE BY MOUTH TWICE A DAY   Qty: 60 Cap, Refills:  2          Associated Diagnoses: DDD (degenerative disc disease), lumbar; Neuropathic pain, leg, left               cyanocobalamin (VITAMIN B12) 500 mcg  tablet  Take 1 Tab by mouth daily.   Qty: 90 Tab, Refills:  3          Associated Diagnoses: Neuropathy               co-enzyme Q-10 (CO Q-10) 100 mg capsule  Take 100 mg by mouth daily.               cpap machine kit  by Does Not Apply route.                     STOP taking these medications                  trimethoprim-sulfamethoxazole (BACTRIM DS, SEPTRA DS) 160-800 mg per tablet  Comments:    Reason for Stopping:                      cephALEXin (KEFLEX) 500 mg capsule  Comments:    Reason for Stopping:                               Diet:  Regular diet.      Activity:  As tolerated      Disposition: [facility]      Discharge instructions to patient/family   Please seek medical attention for any new or worsening symptoms particularly fever, chest pain, shortness of breath, abdominal pain, nausea, vomiting      Follow up plans/appointments     Follow-up Information               Follow up With  Specialties  Details  Why  Contact Info              [name], MD  Family Practice  On 06/09/2018  at 2:15pm for hospital follow up  [address]   [place] [address]   [phone]                    Marion Downer, MD   Family Medicine Resident              For Billing          Chief Complaint      Patient presents with      ?  Fatigue      ?  Foot Problem               Hospital Problems   Date Reviewed:  2018/06/27                 Codes  Class  Noted  POA        Abscess of right foot  ICD-10-CM: L02.611   ICD-9-CM: 682.7    06/04/2018  Unknown                Acute osteomyelitis of left foot ([place])  ICD-10-CM: M86.172   ICD-9-CM: 730.07    06/04/2018  Unknown                Cellulitis  ICD-10-CM: L03.90   ICD-9-CM: 682.9    June 27, 2018  Unknown                * (Principal) Right foot infection  ICD-10-CM: L08.9   ICD-9-CM: 686.9    06/27/2018  Unknown                Polyneuropathy (Chronic)  ICD-10-CM: G62.9   ICD-9-CM: 356.9    05/28/2016  Yes                Obesity  ICD-10-CM: E66.9   ICD-9-CM: 278.00    04/24/2015  Unknown                Essential hypertension, benign (Chronic)  ICD-10-CM: I10   ICD-9-CM: 401.1    02/21/2015  Yes        Overview Signed 08/10/2016  3:46 PM by Margarette Canada, LPN          Overview:    Qualifier: Diagnosis of    By: Rogue Bussing CMA, Jacqualynn                          HLD (hyperlipidemia) (Chronic)  ICD-10-CM: E78.5   ICD-9-CM: 272.4    02/21/2015  Yes                Coronary atherosclerosis of native coronary artery (Chronic)  ICD-10-CM: I25.10   ICD-9-CM: 414.01    02/21/2015  Yes         Overview Addendum 08/10/2016  3:46 PM by Margarette Canada, LPN          S/p CABG x5. DOE was presenting complaint.                         OSA on CPAP (Chronic)  ICD-10-CM: K16.01, Z99.89   ICD-9-CM: 327.23, V46.8    02/21/2015  Yes                S/P CABG (coronary artery bypass graft)  ICD-10-CM: Z95.1   ICD-9-CM: V45.81    08/23/2013  Yes        Overview Signed 08/10/2016  3:46 PM by Margarette Canada, LPN          Overview:    2009

## 2018-06-09 ENCOUNTER — Encounter: Attending: Family Medicine | Primary: Family Medicine

## 2018-06-13 ENCOUNTER — Encounter

## 2018-06-13 NOTE — Telephone Encounter (Signed)
Last office visit on 06/02/2018  Last labs on 06/08/2018  Please advise, thank you

## 2018-06-13 NOTE — Telephone Encounter (Signed)
Please verify that he these medications sent to [facility] before I sign them.  Thank you,  Dr. Mliss Sax.

## 2018-06-13 NOTE — Telephone Encounter (Signed)
[facility] is the correct pharmacy.

## 2018-06-14 ENCOUNTER — Encounter: Attending: Family Medicine | Primary: Family Medicine

## 2018-06-14 MED ORDER — PREGABALIN 300 MG CAP
300 mg | ORAL_CAPSULE | ORAL | 2 refills | Status: DC
Start: 2018-06-14 — End: 2018-09-29

## 2018-06-14 MED ORDER — DULOXETINE 60 MG CAP, DELAYED RELEASE
60 mg | ORAL_CAPSULE | ORAL | 2 refills | Status: DC
Start: 2018-06-14 — End: 2018-11-24

## 2018-06-14 MED ORDER — LISINOPRIL 10 MG TAB
10 mg | ORAL_TABLET | Freq: Every day | ORAL | 5 refills | Status: DC
Start: 2018-06-14 — End: 2018-11-13

## 2018-06-14 MED ORDER — DICLOFENAC 1 % TOPICAL GEL
1 % | Freq: Four times a day (QID) | CUTANEOUS | 4 refills | Status: DC | PRN
Start: 2018-06-14 — End: 2019-03-07

## 2018-06-14 MED ORDER — CYANOCOBALAMIN 500 MCG TAB
500 mcg | ORAL_TABLET | Freq: Every day | ORAL | 3 refills | Status: AC
Start: 2018-06-14 — End: ?

## 2018-06-14 MED ORDER — NEBIVOLOL 10 MG TAB
10 mg | ORAL_TABLET | ORAL | 2 refills | Status: DC
Start: 2018-06-14 — End: 2018-08-15

## 2018-06-14 MED ORDER — FERROUS SULFATE 325 MG (65 MG ELEMENTAL IRON) TAB
325 mg (65 mg iron) | ORAL_TABLET | Freq: Two times a day (BID) | ORAL | 3 refills | Status: DC
Start: 2018-06-14 — End: 2018-11-13

## 2018-06-21 NOTE — Telephone Encounter (Signed)
Pt asking if Dr Germaine Pomfret can prescribe his IV medication for more than 1 dose at a time as he is paying a lot for ea dose.   Attempted to ask who is dispensing this medication and he said we should have all of that info   Please call [phone]

## 2018-06-21 NOTE — Telephone Encounter (Signed)
Discussed with patient.  Antibiotics are written to continue through April 20.

## 2018-06-28 NOTE — Telephone Encounter (Signed)
Karin Golden called from a Home health company called [employer]. She states that a plan of care was faxed over last week and they have not received it back yet.     [phone]

## 2018-06-29 NOTE — Telephone Encounter (Signed)
Once Dr. Mliss Sax signs off on the forms they will be faxed back in. Thank you.

## 2018-07-04 NOTE — Progress Notes (Signed)
Phone call to patient.  [place] is feeling better [place].  Cannot get foot surgery due to COVID restrictions.

## 2018-07-04 NOTE — Progress Notes (Signed)
Phone call to patient.  [place] is feeling better [place].  Cannot get foot surgery due to COVID restrictions.

## 2018-07-17 NOTE — Telephone Encounter (Signed)
-----   Message from Jeanella Craze sent at 07/17/2018  3:00 PM EDT -----  Regarding: DR Mliss Sax / [facility] Saunas  General Message/Vendor Calls    Requesting CBC, BMP, EKG to be faxed to:     [facility]     Fax: [phone]        Best contact number(s): [phone]          Jeanella Craze

## 2018-07-18 NOTE — Telephone Encounter (Signed)
Labs and EKG faxed to [facility].

## 2018-07-21 ENCOUNTER — Encounter

## 2018-07-22 ENCOUNTER — Encounter

## 2018-07-25 NOTE — Telephone Encounter (Signed)
Pt would like to set up a VV to discuss medication. Dr. Mliss Sax does not have any availability until Friday. Pt would like to be seen sooner and request a message be sent to Dr. Mliss Sax.

## 2018-07-25 NOTE — Telephone Encounter (Signed)
Appointment made on 07/26/18, okay per Dr. Mliss Sax.

## 2018-07-26 ENCOUNTER — Telehealth: Attending: Family Medicine | Primary: Family Medicine

## 2018-07-26 ENCOUNTER — Telehealth: Admit: 2018-07-26 | Discharge: 2018-07-26 | Payer: MEDICARE | Attending: Family Medicine | Primary: Family Medicine

## 2018-07-26 DIAGNOSIS — L02611 Cutaneous abscess of right foot: Secondary | ICD-10-CM

## 2018-07-26 MED ORDER — FLUTICASONE 50 MCG/ACTUATION NASAL SPRAY, SUSP
50 mcg/actuation | Freq: Every day | NASAL | 2 refills | Status: DC
Start: 2018-07-26 — End: 2019-03-07

## 2018-07-26 MED ORDER — PANTOPRAZOLE 40 MG TAB, DELAYED RELEASE
40 mg | ORAL_TABLET | Freq: Every day | ORAL | 2 refills | Status: DC
Start: 2018-07-26 — End: 2018-11-24

## 2018-07-26 MED ORDER — FEXOFENADINE 180 MG TAB
180 mg | ORAL_TABLET | Freq: Every day | ORAL | 1 refills | Status: DC
Start: 2018-07-26 — End: 2019-03-07

## 2018-07-26 NOTE — Progress Notes (Addendum)
[name]  [address]  [place], [name]  [phone]           Progress Note    Patient: Elijah [name] MRN: [id number]  SSN: [id number]    Date of Birth: April 21, 1950  Age: 68 y.o.  Sex: male      Elijah [name] is a 68 y.o. male evaluated via synchronous telemedicine on 07/26/2018.        Nurse involved in care:Nurse Borum  Location of patient:Patient Home  Location of physician:My Office    Consent:  He and/or health care decision maker is aware that that he may receive a bill for this telemedicine service, depending on his insurance coverage, and has provided verbal consent to proceed: Yes      Documentation:  I communicated with the patient and/or health care decision maker about Multiple issues.   Details of this discussion including any medical advice provided: See detailed note below.      I affirm this is a Patient Initiated Episode with an Established Patient who has not had a related appointment within my department in the past 7 days or scheduled within the next 24 hours.        Note: not billable if this call serves to triage the patient into an appointment for the relevant concern      Elijah Logan, MD     Chief Complaint   Patient presents with   ??? GERD         Subjective:     Encounter Diagnoses   Name Primary?   ??? Abscess of right foot: This wound necessitated a skin graft.  He did not have osteomyelitis in the right foot.   Yes   ??? Gastroesophageal reflux disease, esophagitis presence not specified: He has lost 90 pounds since his gastric sleeve procedure.  Over the last 3 weeks he has developed significant and severe acid reflux with esophageal irritation symptoms.  They are somewhat better on omeprazole but I think he needs Protonix to ensure he does not have a GI bleed.  He is under maximum stress with his infections.        ??? 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  He has 3-4 more days.  He sees his podiatrist in follow-up tomorrow.        ??? Environmental and seasonal allergies:  This is a new problem.  He has both rhinorrhea and postnasal drip plus itchy watery eyes.  He will need treatment for this.        ??? Acute torn meniscus of knee, right, sequela: Knee surgery will be postponed until his feet heal up.  He is anxious to have the opportunity to be more active now he has lost 90 pounds.        ??? Atherosclerosis of native coronary artery of native heart without angina pectoris: No chest pain or shortness of breath.              Current and past medical information:    Current Medications after this visit::     Current Outpatient Medications   Medication Sig   ??? pantoprazole (PROTONIX) 40 mg tablet Take 1 Tab by mouth daily. Indications: gastroesophageal reflux disease   ??? fexofenadine (ALLEGRA) 180 mg tablet Take 1 Tab by mouth daily.   ??? fluticasone propionate (FLONASE) 50 mcg/actuation nasal spray 2 Sprays by Both Nostrils route  daily. Indications: inflammation of the nose due to an allergy   ??? pregabalin (LYRICA) 300 mg capsule TAKE 1 CAPSULE BY MOUTH TWICE A DAY   ??? nebivoloL (Bystolic) 10 mg tablet TAKE 1 TABLET BY MOUTH EVERY DAY   ??? cyanocobalamin (VITAMIN B12) 500 mcg tablet Take 1 Tab by mouth daily.   ??? diclofenac (VOLTAREN) 1 % gel Apply 2 g to affected area four (4) times daily as needed for Pain (Knee pain).   ??? DULoxetine (CYMBALTA) 60 mg capsule TAKE 1 CAPSULE BY MOUTH DAILY   ??? ferrous sulfate 325 mg (65 mg iron) tablet Take 1 Tab by mouth two (2) times daily (with meals).   ??? lisinopriL (PRINIVIL, ZESTRIL) 10 mg tablet Take 1 Tab by mouth daily.   ??? atorvastatin (LIPITOR) 80 mg tablet Take 80 mg by mouth daily.   ??? OTHER,NON-FORMULARY, Flinstone children's chewable vitamins - 2 tablets daily   ??? b complex vitamins tablet Take 1 Tab by mouth daily.   ??? co-enzyme Q-10 (CO Q-10) 100 mg capsule Take 100 mg by mouth daily.    ??? cpap machine kit by Does Not Apply route.     No current facility-administered medications for this visit.        Patient Active Problem List    Diagnosis Date Noted   ??? Polyneuropathy 05/28/2016     Priority: 1 - One   ??? Obesity 04/24/2015     Priority: 1 - One   ??? Essential hypertension, benign 02/21/2015     Priority: 1 - One   ??? HLD (hyperlipidemia) 02/21/2015     Priority: 1 - One   ??? Coronary atherosclerosis of native coronary artery 02/21/2015     Priority: 1 - One   ??? OSA on CPAP 02/21/2015     Priority: 1 - One   ??? Pure hypercholesterolemia 08/23/2013     Priority: 1 - One   ??? 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 Abscess of right foot 06/04/2018   ??? Acute osteomyelitis of left foot ([place]) 06/04/2018   ??? Cellulitis 06/02/2018   ??? Right foot infection 06/02/2018   ??? 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 Chronic fatigue 09/08/2017   ??? Tubular adenoma of colon 09/23/2015   ??? Benign prostatic hyperplasia 12/29/2006       Past Medical History:   Diagnosis Date   ??? BPH (benign prostatic hyperplasia)    ??? CAD (coronary artery disease)     CABG [place] [facility] [place]   ??? History of vascular access device 09/08/2016    [facility] VAT, 5 FR double, R brachial, 48 cm with 1 cm out, LTA   ??? History of vascular access device 06/07/2018    86fbard power solo picc line 44cm at 2cm out, 38 circ, placed by E Franco, no difficulties. r brachial   ??? HLD (hyperlipidemia)    ??? HTN (hypertension)    ??? 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 OSA (obstructive sleep apnea)    ??? Polyneuropathy    ??? Tubular adenoma of colon     x2 on colonoscopy 09/18/15       Allergies   Allergen Reactions   ??? Metoprolol Other (comments)     Patient states, made me feel horrible.       Past Surgical History:   Procedure  Laterality Date   ??? HX CORONARY ARTERY BYPASS GRAFT     ??? HX GASTRIC BYPASS  01/2018    gastric sleeve    ??? HX HIP REPLACEMENT Bilateral     10 years ago   ??? HX ORTHOPAEDIC      right great and 2nd toe surgery       Social History     Socioeconomic History   ??? Marital status: MARRIED     Spouse name: Not on file   ??? Number of children: Not on file   ??? Years of education: Not on file   ??? Highest education level: Not on file   Tobacco Use   ??? Smoking status: Former Smoker     Types: Cigarettes   ??? Smokeless tobacco: Never Used   Substance and Sexual Activity   ??? Alcohol use: No     Comment: recently has stopped   ??? Drug use: No   ??? Sexual activity: Not Currently   Other Topics Concern       Review of Systems   Constitutional: Positive for weight loss. Negative for chills, fever and malaise/fatigue.        Post surgical weight loss of 90 pounds.  Patient is finally feeling better.   HENT: Positive for congestion. Negative for hearing loss.         Watery itchy eyes as well as postnasal drip and rhinorrhea.  He usually does not get allergy symptoms but this is been an exceptionally difficult year.  Allergy load here in his tremendous due to warm weather.   Eyes: Negative.  Negative for blurred vision and double vision.   Respiratory: Negative.  Negative for cough, sputum production and shortness of breath.    Cardiovascular: Negative.  Negative for chest pain and palpitations.   Gastrointestinal: Negative.  Negative for abdominal pain, blood in stool, heartburn, nausea and vomiting.   Genitourinary: Negative.  Negative for dysuria, frequency and urgency.   Musculoskeletal: Positive for joint pain. Negative for back pain, falls, myalgias and neck pain.        Chronic right knee pain due to torn meniscus.   Skin: Negative.  Negative for rash.   Neurological: Negative.  Negative for dizziness, tingling, tremors, weakness and headaches.        He has no pain in his feet where he had surgery because he has such a dense polyneuropathy.   Endo/Heme/Allergies: Negative.     Psychiatric/Behavioral: Negative.  Negative for depression.        Objective:   There were no vitals filed for this visit.   There is no height or weight on file to calculate BMI.    Physical Exam  HENT:      Head: Normocephalic and atraumatic.   Eyes:      Conjunctiva/sclera: Conjunctivae normal.   Pulmonary:      Effort: Pulmonary effort is normal.   Skin:     Findings: No erythema or rash.   Neurological:      Mental Status: He is alert and oriented to person, place, and time.   Psychiatric:         Mood and Affect: Mood and affect normal.         Behavior: Behavior normal.           Health Maintenance Due   Topic Date Due   ??? FOBT Q1Y Age 26-75  12/22/1998   ??? Pneumococcal 65+ years (1 of 1 - PPSV23) 12/21/2013   ???  Shingrix Vaccine Age 19> (2 of 2) 05/04/2018         Assessment and orders:     Encounter Diagnoses     ICD-10-CM ICD-9-CM   1. Abscess of right foot L02.611 682.7   2. Gastroesophageal reflux disease, esophagitis presence not specified K21.9 530.81   3. 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 Environmental and seasonal allergies J30.89 477.8   5. Acute torn meniscus of knee, right, sequela S83.206S 905.7   6. Atherosclerosis of native coronary artery of native heart without angina pectoris I25.10 414.01     Diagnoses and all orders for this visit:    1. Abscess of right foot-status post skin graft.  Sees Dr. Nicholaus Corolla his podiatrist in [place] tomorrow.    2. Gastroesophageal reflux disease, esophagitis presence not specified-new problem since his gastric sleeve procedure.  Recommend elevation of the head of bed on 6 inch cinderblock.  -     pantoprazole (PROTONIX) 40 mg tablet; Take 1 Tab by mouth daily. Indications: gastroesophageal reflux disease    3. 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  He is still on IV antibiotics through a PICC line.  He reports that Dr. Josephina Gip   Called him and said his liver function tests are elevated.  Repeat labs are pending.    4. Environmental and seasonal allergies-new symptoms.  Does not normally get seasonal allergies  -     fexofenadine (ALLEGRA) 180 mg tablet; Take 1 Tab by mouth daily.  -     fluticasone propionate (FLONASE) 50 mcg/actuation nasal spray; 2 Sprays by Both Nostrils route daily. Indications: inflammation of the nose due to an allergy    5. Acute torn meniscus of knee, right, sequela-unable to have surgery until his feet heal up    6. Atherosclerosis of native coronary artery of native heart without angina pectoris-no chest pain throughout all this medical procedures.            Plan of care:  Discussed diagnoses in detail with patient.     Medication risks/benefits/side effects discussed with patient.     All of the patient's questions were addressed. The patient understands and agrees with our plan of care.    The patient knows to call back if they are unsure of or forget any changes we discussed today or if the symptoms change.     The patient received an After-Visit Summary which contains VS, orders, medication list and allergy list. This can be used as a "mini-medical record" should they have to seek medical care while out of town.    Patient Care Team:  Elijah Logan, MD as PCP - General (Family Practice)  Donalda Ewings Neale Burly, MD as PCP - [facility] Empaneled Provider  Cammy Copa, MD (Cardiology)  Verdene Lennert, MD (Orthopedic Surgery)  Beola Cord., MD (Orthopedic Surgery)  [name], Patrick E, DPM (Podiatry)  Valetta Close, DO as Hospitalist (Infectious Diseases)  Rinaldo Cloud, MD (Orthopedic Surgery)          Signed By: Elijah Logan, MD     July 26, 2018      ATTENTION:   This medical record was transcribed using an electronic medical records/speech recognition system.  Although proofread, it may and can contain electronic, spelling and other errors.  Corrections may be  executed at a later time.  Please feel free to contact me  for any clarif

## 2018-07-26 NOTE — Progress Notes (Signed)
[facility]  [address]  [place], [place]  [phone]           Progress Note    Patient: Elijah [name] MRN: [id number]  SSN: [id number]    Date of Birth: [birth year]  Age: 68 y.o.  Sex: male      Elijah [name] is a 68 y.o. male evaluated via synchronous telemedicine on 07/26/2018.        Nurse involved in care:Nurse Borum  Location of patient:Patient Home  Location of physician:My Office    Consent:  He and/or health care decision maker is aware that that he may receive a bill for this telemedicine service, depending on his insurance coverage, and has provided verbal consent to proceed: Yes      Documentation:  I communicated with the patient and/or health care decision maker about Multiple issues.   Details of this discussion including any medical advice provided: See detailed note below.      I affirm this is a Patient Initiated Episode with an Established Patient who has not had a related appointment within my department in the past 7 days or scheduled within the next 24 hours.        Note: not billable if this call serves to triage the patient into an appointment for the relevant concern      [name], MD     Chief Complaint   Patient presents with   ??? GERD         Subjective:     Encounter Diagnoses   Name Primary?   ??? Abscess of right foot: This wound necessitated a skin graft.  He did not have osteomyelitis in the right foot.   Yes   ??? Gastroesophageal reflux disease, esophagitis presence not specified: He has lost 90 pounds since his gastric sleeve procedure.  Over the last 3 weeks he has developed significant and severe acid reflux with esophageal irritation symptoms.  They are somewhat better on omeprazole but I think he needs Protonix to ensure he does not have a GI bleed.  He is under maximum stress with his infections.        ??? 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  He has 3-4 more  days.  He sees his podiatrist in follow-up tomorrow.        ??? Environmental and seasonal allergies:  This is a new problem.  He has both rhinorrhea and postnasal drip plus itchy watery eyes.  He will need treatment for this.        ??? Acute torn meniscus of knee, right, sequela: Knee surgery will be postponed until his feet heal up.  He is anxious to have the opportunity to be more active now he has lost 90 pounds.        ??? Atherosclerosis of native coronary artery of native heart without angina pectoris: No chest pain or shortness of breath.              Current and past medical information:    Current Medications after this visit::     Current Outpatient Medications   Medication Sig   ??? pantoprazole (PROTONIX) 40 mg tablet Take 1 Tab by mouth daily. Indications: gastroesophageal reflux disease   ??? fexofenadine (ALLEGRA) 180 mg tablet Take 1 Tab by mouth daily.   ??? fluticasone propionate (FLONASE) 50 mcg/actuation nasal spray 2 Sprays by Both Nostrils route  daily. Indications: inflammation of the nose due to an allergy   ??? pregabalin (LYRICA) 300 mg capsule TAKE 1 CAPSULE BY MOUTH TWICE A DAY   ??? nebivoloL (Bystolic) 10 mg tablet TAKE 1 TABLET BY MOUTH EVERY DAY   ??? cyanocobalamin (VITAMIN B12) 500 mcg tablet Take 1 Tab by mouth daily.   ??? diclofenac (VOLTAREN) 1 % gel Apply 2 g to affected area four (4) times daily as needed for Pain (Knee pain).   ??? DULoxetine (CYMBALTA) 60 mg capsule TAKE 1 CAPSULE BY MOUTH DAILY   ??? ferrous sulfate 325 mg (65 mg iron) tablet Take 1 Tab by mouth two (2) times daily (with meals).   ??? lisinopriL (PRINIVIL, ZESTRIL) 10 mg tablet Take 1 Tab by mouth daily.   ??? atorvastatin (LIPITOR) 80 mg tablet Take 80 mg by mouth daily.   ??? OTHER,NON-FORMULARY, Flinstone children's chewable vitamins - 2 tablets daily   ??? b complex vitamins tablet Take 1 Tab by mouth daily.   ??? co-enzyme Q-10 (CO Q-10) 100 mg capsule Take 100 mg by mouth daily.   ??? cpap machine kit by Does Not Apply route.     No  current facility-administered medications for this visit.        Patient Active Problem List    Diagnosis Date Noted   ??? Polyneuropathy 05/28/2016     Priority: 1 - One   ??? Obesity 04/24/2015     Priority: 1 - One   ??? Essential hypertension, benign 02/21/2015     Priority: 1 - One   ??? HLD (hyperlipidemia) 02/21/2015     Priority: 1 - One   ??? Coronary atherosclerosis of native coronary artery 02/21/2015     Priority: 1 - One   ??? OSA on CPAP 02/21/2015     Priority: 1 - One   ??? Pure hypercholesterolemia 08/23/2013     Priority: 1 - One   ??? 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 Abscess of right foot 06/04/2018   ??? Acute osteomyelitis of left foot ([place]) 06/04/2018   ??? Cellulitis 06/02/2018   ??? Right foot infection 06/02/2018   ??? 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 Chronic fatigue 09/08/2017   ??? Tubular adenoma of colon 09/23/2015   ??? Benign prostatic hyperplasia 12/29/2006       Past Medical History:   Diagnosis Date   ??? BPH (benign prostatic hyperplasia)    ??? CAD (coronary artery disease)     CABG x 5 2011[place]ke [place]   ??? History of vascular access device 09/08/2016    [facility] VAT, 5 FR double, R brachial, 48 cm with 1 cm out, LTA   ??? History of vascular access device 06/07/2018    69fbard power solo picc line 44cm at 2cm out, 38 circ, placed by E Franco, no difficulties. r brachial   ??? HLD (hyperlipidemia)    ??? HTN (hypertension)    ??? [place] 04/[place]   ??? OSA (obstructive sleep apnea)    ??? Polyneuropathy    ??? Tubular adenoma of colon     x2 on colonoscopy 09/18/15       Allergies   Allergen Reactions   ??? Metoprolol Other (comments)     Patient states, made me feel horrible.       Past Surgical History:   Procedure  Laterality Date   ??? HX CORONARY ARTERY BYPASS GRAFT     ??? HX GASTRIC BYPASS  01/2018    gastric sleeve   ??? HX HIP REPLACEMENT Bilateral     10 years ago   ??? HX ORTHOPAEDIC       right great and 2nd toe surgery       Social History     Socioeconomic History   ??? Marital status: MARRIED     Spouse name: Not on file   ??? Number of children: Not on file   ??? Years of education: Not on file   ??? Highest education level: Not on file   Tobacco Use   ??? Smoking status: Former Smoker     Types: Cigarettes   ??? Smokeless tobacco: Never Used   Substance and Sexual Activity   ??? Alcohol use: No     Comment: recently has stopped   ??? Drug use: No   ??? Sexual activity: Not Currently   Other Topics Concern       Review of Systems   Constitutional: Positive for weight loss. Negative for chills, fever and malaise/fatigue.        Post surgical weight loss of 90 pounds.  Patient is finally feeling better.   HENT: Positive for congestion. Negative for hearing loss.         Watery itchy eyes as well as postnasal drip and rhinorrhea.  He usually does not get allergy symptoms but this is been an exceptionally difficult year.  Allergy load here in his tremendous due to warm weather.   Eyes: Negative.  Negative for blurred vision and double vision.   Respiratory: Negative.  Negative for cough, sputum production and shortness of breath.    Cardiovascular: Negative.  Negative for chest pain and palpitations.   Gastrointestinal: Negative.  Negative for abdominal pain, blood in stool, heartburn, nausea and vomiting.   Genitourinary: Negative.  Negative for dysuria, frequency and urgency.   Musculoskeletal: Positive for joint pain. Negative for back pain, falls, myalgias and neck pain.        Chronic right knee pain due to torn meniscus.   Skin: Negative.  Negative for rash.   Neurological: Negative.  Negative for dizziness, tingling, tremors, weakness and headaches.        He has no pain in his feet where he had surgery because he has such a dense polyneuropathy.   Endo/Heme/Allergies: Negative.    Psychiatric/Behavioral: Negative.  Negative for depression.        Objective:   There were no vitals filed for this visit.    There is no height or weight on file to calculate BMI.    Physical Exam  HENT:      Head: Normocephalic and atraumatic.   Eyes:      Conjunctiva/sclera: Conjunctivae normal.   Pulmonary:      Effort: Pulmonary effort is normal.   Skin:     Findings: No erythema or rash.   Neurological:      Mental Status: He is alert and oriented to person, place, and time.   Psychiatric:         Mood and Affect: Mood and affect normal.         Behavior: Behavior normal.           Health Maintenance Due   Topic Date Due   ??? FOBT Q1Y Age 85-75  12/22/1998   ??? Pneumococcal 65+ years (1 of 1 - PPSV23) 12/21/2013   ???  Shingrix Vaccine Age 28> (2 of 2) 05/04/2018         Assessment and orders:     Encounter Diagnoses     ICD-10-CM ICD-9-CM   1. Abscess of right foot L02.611 682.7   2. Gastroesophageal reflux disease, esophagitis presence not specified K21.9 530.81   3. 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 Environmental and seasonal allergies J30.89 477.8   5. Acute torn meniscus of knee, right, sequela S83.206S 905.7   6. Atherosclerosis of native coronary artery of native heart without angina pectoris I25.10 414.01     Diagnoses and all orders for this visit:    1. Abscess of right foot-status post skin graft.  Sees Dr. Nicholaus Corolla his podiatrist in [place] tomorrow.    2. Gastroesophageal reflux disease, esophagitis presence not specified-new problem since his gastric sleeve procedure.  Recommend elevation of the head of bed on 6 inch cinderblock.  -     pantoprazole (PROTONIX) 40 mg tablet; Take 1 Tab by mouth daily. Indications: gastroesophageal reflux disease    3. 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  He is still on IV antibiotics through a PICC line.  He reports that Dr. Josephina Gip  Called him and said his liver function tests are elevated.  Repeat labs are pending.    4. Environmental and seasonal allergies-new symptoms.   Does not normally get seasonal allergies  -     fexofenadine (ALLEGRA) 180 mg tablet; Take 1 Tab by mouth daily.  -     fluticasone propionate (FLONASE) 50 mcg/actuation nasal spray; 2 Sprays by Both Nostrils route daily. Indications: inflammation of the nose due to an allergy    5. Acute torn meniscus of knee, right, sequela-unable to have surgery until his feet heal up    6. Atherosclerosis of native coronary artery of native heart without angina pectoris-no chest pain throughout all this medical procedures.            Plan of care:  Discussed diagnoses in detail with patient.     Medication risks/benefits/side effects discussed with patient.     All of the patient's questions were addressed. The patient understands and agrees with our plan of care.    The patient knows to call back if they are unsure of or forget any changes we discussed today or if the symptoms change.     The patient received an After-Visit Summary which contains VS, orders, medication list and allergy list. This can be used as a "mini-medical record" should they have to seek medical care while out of town.    Patient Care Team:  [name], MD as PCP - General (Family Practice)  Donalda Ewings Neale Burly, MD as PCP - [facility] Empaneled Provider  Cammy Copa, MD (Cardiology)  Verdene Lennert, MD (Orthopedic Surgery)  Beola Cord., MD (Orthopedic Surgery)  [name], Patrick E, DPM (Podiatry)  Valetta Close, DO as Hospitalist (Infectious Diseases)  Rinaldo Cloud, MD (Orthopedic Surgery)          Signed By: [name], MD     July 26, 2018      ATTENTION:   This medical record was transcribed using an electronic medical records/speech recognition system.  Although proofread, it may and can contain electronic, spelling and other errors.  Corrections may be executed at a later time.  Please feel free to contact me for  any clarif

## 2018-07-27 LAB — HEPATIC FUNCTION PANEL
ALT (SGPT): 85 IU/L — ABNORMAL HIGH (ref 0–44)
ALT: 85 IU/L — ABNORMAL HIGH (ref 0–44)
AST (SGOT): 58 IU/L — ABNORMAL HIGH (ref 0–40)
AST: 58 IU/L — ABNORMAL HIGH (ref 0–40)
Albumin: 4.3 g/dL (ref 3.8–4.8)
Albumin: 4.3 g/dL (ref 3.8–4.8)
Alk. phosphatase: 180 IU/L — ABNORMAL HIGH (ref 39–117)
Alkaline Phosphatase: 180 IU/L — ABNORMAL HIGH (ref 39–117)
Bilirubin, Direct: 0.17 mg/dL (ref 0.00–0.40)
Bilirubin, direct: 0.17 mg/dL (ref 0.00–0.40)
Bilirubin, total: 0.5 mg/dL (ref 0.0–1.2)
Protein, total: 7.5 g/dL (ref 6.0–8.5)
Total Bilirubin: 0.5 mg/dL (ref 0.0–1.2)
Total Protein: 7.5 g/dL (ref 6.0–8.5)

## 2018-07-27 NOTE — Telephone Encounter (Signed)
Pt called requesting call back from Dr. Germaine Pomfret with results of labs done at [facility] in [place] yesterday, stating he was advised by Dr. Germaine Pomfret to let him know when done for results to be flagged for him to review.     Please call pt at [phone]. Ldm

## 2018-07-28 ENCOUNTER — Encounter

## 2018-07-28 NOTE — Telephone Encounter (Signed)
Lab is resulted in chart.

## 2018-07-28 NOTE — Telephone Encounter (Signed)
Can you please request these labs from [facility]

## 2018-08-08 ENCOUNTER — Encounter

## 2018-08-08 NOTE — Telephone Encounter (Signed)
Pt called upset because he did not receive lab orders from Dr. Germaine Pomfret, after being notified of abnormal liver results,  requests orders to be faxed to [phone] and he can have the nurse draw them tomorrow.    Pt also requests call back at [phone] to advise when done. Ldm

## 2018-08-08 NOTE — Telephone Encounter (Signed)
Called and spoke with pt, and he has been advised and states understanding that lab orders have been faxed and confirmation received.

## 2018-08-09 ENCOUNTER — Encounter

## 2018-08-09 ENCOUNTER — Telehealth

## 2018-08-09 NOTE — Telephone Encounter (Signed)
Lab orders have been faxed and confirmation received.

## 2018-08-09 NOTE — Telephone Encounter (Signed)
Pt called in regards to lab orders.     Pt needs to have labs sent to [facility].     Spoke with Graciella Belton, pt's home health nurse, and a verbal order for pt to have a Hepatic Function Panel has been ordered per orders.     New lab slips will be faxed to [facility] on [phone] once signed by provider.

## 2018-08-11 ENCOUNTER — Encounter

## 2018-08-15 ENCOUNTER — Encounter

## 2018-08-15 MED ORDER — NEBIVOLOL 10 MG TAB
10 mg | ORAL_TABLET | ORAL | 2 refills | Status: DC
Start: 2018-08-15 — End: 2018-08-24

## 2018-08-15 NOTE — Telephone Encounter (Signed)
-----   Message from Jeanella Craze sent at 08/15/2018  9:25 AM EDT -----  Regarding: DR Mliss Sax / REFILL  General Message/Vendor Calls      Pt is requesting a refill on:    "Bystolic  10 MG "      To be called into the [facility] listed in chart.           Callback required   Best contact number(s):[phone]                  Jeanella Craze

## 2018-08-15 NOTE — Telephone Encounter (Signed)
Last office visit on 07/26/2018  Last labs on 05/19/2018  Please advise, thank you

## 2018-08-24 MED ORDER — NEBIVOLOL 10 MG TAB
10 mg | ORAL_TABLET | ORAL | 2 refills | Status: AC
Start: 2018-08-24 — End: ?

## 2018-08-24 NOTE — Telephone Encounter (Signed)
Last office visit on 07/26/18. Please advise, thank you

## 2018-08-24 NOTE — Telephone Encounter (Signed)
-----   Message from Frances Furbish sent at 08/24/2018  8:33 AM EDT -----  Regarding: Dr. Tomasa Blase  Contact: [phone]  Caller (if not patient):  Relationship of caller (if not patient):   Best contact number(s): [phone]  Name of medication and dosage if known: Bystolic  Is patient out of this medication (yes/no): almost  [facility]  [facility] listed in chart? (yes/no): Yes   Pharmacy phone number: [phone]  Date of last visit:   Details to clarify the request:

## 2018-09-29 ENCOUNTER — Encounter

## 2018-09-29 MED ORDER — PREGABALIN 300 MG CAP
300 mg | ORAL_CAPSULE | Freq: Two times a day (BID) | ORAL | 0 refills | Status: DC
Start: 2018-09-29 — End: 2018-11-02

## 2018-09-29 MED ORDER — PREGABALIN 300 MG CAP
300 mg | ORAL_CAPSULE | ORAL | 0 refills | Status: DC
Start: 2018-09-29 — End: 2018-09-29

## 2018-10-11 ENCOUNTER — Encounter

## 2018-10-25 ENCOUNTER — Inpatient Hospital Stay: Admit: 2018-10-25 | Payer: MEDICARE | Primary: Family Medicine

## 2018-10-26 LAB — LIPID PANEL
CHOL/HDL Ratio: 2.7 (ref 0.0–5.0)
Chol/HDL Ratio: 2.7 (ref 0.0–5.0)
Cholesterol, Total: 135 MG/DL (ref <200)
Cholesterol, total: 135 MG/DL (ref <200)
HDL Cholesterol: 50 MG/DL
HDL: 50 MG/DL
LDL Calculated: 61.2 MG/DL (ref 0–100)
LDL, calculated: 61.2 MG/DL (ref 0–100)
Triglyceride: 119 MG/DL (ref <150)
Triglycerides: 119 MG/DL (ref <150)
VLDL Cholesterol Calculated: 23.8 MG/DL
VLDL, calculated: 23.8 MG/DL

## 2018-10-26 LAB — HEPATIC FUNCTION PANEL
A-G Ratio: 1.1 (ref 1.1–2.2)
ALT (SGPT): 77 U/L (ref 12–78)
ALT: 77 U/L (ref 12–78)
AST (SGOT): 41 U/L — ABNORMAL HIGH (ref 15–37)
AST: 41 U/L — ABNORMAL HIGH (ref 15–37)
Albumin/Globulin Ratio: 1.1 (ref 1.1–2.2)
Albumin: 3.7 g/dL (ref 3.5–5.0)
Albumin: 3.7 g/dL (ref 3.5–5.0)
Alk. phosphatase: 74 U/L (ref 45–117)
Alkaline Phosphatase: 74 U/L (ref 45–117)
Bilirubin, Direct: 0.2 MG/DL (ref 0.0–0.2)
Bilirubin, direct: 0.2 MG/DL (ref 0.0–0.2)
Bilirubin, total: 0.8 MG/DL (ref 0.2–1.0)
Globulin: 3.4 g/dL (ref 2.0–4.0)
Globulin: 3.4 g/dL (ref 2.0–4.0)
Protein, total: 7.1 g/dL (ref 6.4–8.2)
Total Bilirubin: 0.8 MG/DL (ref 0.2–1.0)
Total Protein: 7.1 g/dL (ref 6.4–8.2)

## 2018-10-26 LAB — BASIC METABOLIC PANEL
Anion Gap: 7 mmol/L (ref 5–15)
BUN: 20 MG/DL (ref 6–20)
Bun/Cre Ratio: 19 (ref 12–20)
CO2: 25 mmol/L (ref 21–32)
Calcium: 9.5 MG/DL (ref 8.5–10.1)
Chloride: 109 mmol/L — ABNORMAL HIGH (ref 97–108)
Creatinine: 1.06 MG/DL (ref 0.70–1.30)
EGFR IF NonAfrican American: >60 mL/min/{1.73_m2} (ref >60)
GFR African American: >60 mL/min/{1.73_m2} (ref >60)
Glucose: 87 mg/dL (ref 65–100)
Potassium: 4.7 mmol/L (ref 3.5–5.1)
Sodium: 141 mmol/L (ref 136–145)

## 2018-10-26 LAB — METABOLIC PANEL, BASIC
Anion gap: 7 mmol/L (ref 5–15)
BUN/Creatinine ratio: 19 (ref 12–20)
BUN: 20 MG/DL (ref 6–20)
CO2: 25 mmol/L (ref 21–32)
Calcium: 9.5 MG/DL (ref 8.5–10.1)
Chloride: 109 mmol/L — ABNORMAL HIGH (ref 97–108)
Creatinine: 1.06 MG/DL (ref 0.70–1.30)
GFR est AA: >60 mL/min/{1.73_m2} (ref >60)
GFR est non-AA: >60 mL/min/{1.73_m2} (ref >60)
Glucose: 87 mg/dL (ref 65–100)
Potassium: 4.7 mmol/L (ref 3.5–5.1)
Sodium: 141 mmol/L (ref 136–145)

## 2018-10-26 LAB — SAMPLES BEING HELD

## 2018-10-28 MED ORDER — ATORVASTATIN 40 MG TAB
40 mg | ORAL_TABLET | Freq: Every day | ORAL | 1 refills | Status: DC
Start: 2018-10-28 — End: 2018-11-24

## 2018-11-02 ENCOUNTER — Encounter

## 2018-11-02 MED ORDER — PREGABALIN 300 MG CAP
300 mg | ORAL_CAPSULE | Freq: Two times a day (BID) | ORAL | 0 refills | Status: DC
Start: 2018-11-02 — End: 2018-11-24

## 2018-11-06 NOTE — Telephone Encounter (Signed)
Verified patient with two types of identifiers. Patient is requesting a sooner VV for cardiac clearance. He reports he is doing well and just needs an outpatient surgery on his foot. Will notify MD.

## 2018-11-06 NOTE — Telephone Encounter (Signed)
Patient is scheduled for cardiac clearance on 11/20/18 but would like to get in much sooner. Please advise.    Phone #: [phone]  Thanks

## 2018-11-08 NOTE — Telephone Encounter (Signed)
Sure can we add on for Monday at 440 pm or 5 pm for VV

## 2018-11-09 NOTE — Telephone Encounter (Signed)
Verified patient with two types of identifiers. Gave patient Dr. Nyoka Cowden message about his cardiac clearance appointment. Rescheduled for Monday at 1640. Patient verbalized understanding and will call with any other questions.

## 2018-11-13 ENCOUNTER — Telehealth: Attending: Specialist | Primary: Family Medicine

## 2018-11-13 ENCOUNTER — Telehealth: Admit: 2018-11-13 | Payer: MEDICARE | Attending: Specialist | Primary: Family Medicine

## 2018-11-13 DIAGNOSIS — I2510 Atherosclerotic heart disease of native coronary artery without angina pectoris: Secondary | ICD-10-CM

## 2018-11-13 NOTE — Progress Notes (Signed)
Elijah [name]     1950-09-11       Jannelly Bergren [name] MD, [facility]  Date of Visit-11/13/2018   PCP is Spence, Neale Burly, MD   [facility]  [facility]  Virtual Visit  HPI:  Elijah [name] is a 68 y.o. male   who was seen by synchronous (real-time) audio-video technology on 11/13/2018.      Fu of  Last OV 11-23-17  Now pre op urgent OV for podiatry at [facility] to operate on metatarasal  Hx of CABG 2011 in [place]. 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 Post surgery had fatigue with BB, changed to Bystolic.  stress test in January 2018 with a fixed basal inferior defect and EF of 53%. Has lost 3 family members including son at young age, travels to [place]. They gave him a nuclear stress test and they suggested bariatric surgery. He was also told about some low blood flow and suggested a heart catheterization.           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 Pt's brother died of a heart attack.   Pt was to have joint surgery in February but was delayed. Last visit August 2019. Pt usually seen in [place]. Hx of CABG in 2011 in [place]. Stress test in January showed a fixed basal inferior defect, EF 53%.    Pt's current surgery for preop is right first MTJ fusion with revision of wound from Dr. Jeneen Rinks at [facility].     Pt has a sore on his foot that he needs to have surgery on. From a cardiac standpoint, pt is doing well. Pt has lost 100 lbs since having bariatric surgery. Pt notes that his right leg will occasionally swell secondary to his foot wound. Pt's BP has been more normal. He is taking half of a Bystolic now. Pt denies any chest discomfort or SOB. Pt is planning on moving to [place]. Pt takes a baby ASA.        1. Coronary artery disease involving native coronary artery of native heart without angina pectoris  Prior CABG. Previous stress test reviewed. He is now preop for surgery on the metatarsal joint. I have no objection to surgery. He has no angina.   I have no objection to the planned  surgery. Patient seems to be at a low risk for peri-operative severe adverse cardiac events.     2. Essential hypertension, benign  Now with bariatric surgery has had a 100 lb weight loss and BP is improved to where he is no longer on Amlodipine and is on a half pill of Bystolic.     3. Pure hypercholesterolemia  Lipids on high potency statin as appropriate for secondary prevention.     4. Hx of CABG  5. OSA on CPAP      F/u in 1 year at [facility]       Previous hx or visit:      CABG x 5 2011    NUKE 04-07-16 Lexiscan  SPECT images demonstrate a medium, fixed abnormality of moderate degree in the basal inferior region on the stress and rest images. Gated SPECT images reveals normal myocardial thickening and wall motion. The left ventricular ejection fraction  was calculated to be 53 %.       Key CAD CHF Meds             atorvastatin (LIPITOR) 40 mg tablet (Taking) Take 1 Tab by mouth daily. Indications: hardening of the arteries due to plaque buildup    nebivoloL (Bystolic) 10 mg tablet (Taking) TAKE 1 TABLET BY MOUTH EVERY DAY           ROS-except as noted above.. A complete cardiac and respiratory are reviewed and negative except as above ; Resp-denies wheezing  or productive cough,. Const- No unusual weight loss or fever; Neuro-no recent seizure or CVA ; GI- No BRBPR, abdom pain, bloating ; GU- no  hematuria   Past Medical History:   Diagnosis Date   ??? BPH (benign prostatic hyperplasia)    ??? CAD (coronary artery disease)     CABG x 5 2011- [facility]ke [facility] [place] [facility]   ??? History of vascular access device 09/08/2016    [facility] VAT, 5 FR double, R brachial, 48 cm with 1 cm out, LTA    ??? History of vascular access device 06/07/2018    68fbard power solo picc line 44cm at 2cm out, 38 circ, placed by E Franco, no difficulties. r brachial   ??? HLD (hyperlipidemia)    ??? HTN (hypertension)    ??? Morbid obesity due [place] excess calories [place] 04[place]   ??? OSA (obstructive sleep apnea)    ??? Polyneuropathy    ??? Tubular adenoma of colon     x2 on colonoscopy 09/18/15      Social Hx= reports that he has quit smoking. His smoking use included cigarettes. He has never used smokeless tobacco. He reports that he does not drink alcohol or use drugs.     Due to this being a TeleHealth evaluation, many elements of the physical examination are unable to be assessed.   Vitals if sent, or see HPI  There were no vitals taken for this visit.   General: Well developed, in no acute distress, cooperative and alert  HEENT: Pupils equal/round. No marked JVD visible on video.  Respiratory: No audible wheezing, no signs of respiratory distress, lips non cyanotic  Extremities:  No edema  Neuro: A&Ox3, speech clear, no facial droop, answering questions appropriately  Skin: Skin color is normal. No rashes or lesions. Non diaphoretic on visible skin during exam  Psych: mood and affect are appropriate and pleasant    Lab Results   Component Value Date/Time    Cholesterol, total 135 10/25/2018 09:36 AM    HDL Cholesterol 50 10/25/2018 09:36 AM    LDL, calculated 61.2 10/25/2018 09:36 AM    Triglyceride 119 10/25/2018 09:36 AM    CHOL/HDL Ratio 2.7 10/25/2018 09:36 AM     Lab Results   Component Value Date/Time    Sodium 141 10/25/2018 09:36 AM    Potassium 4.7 10/25/2018 09:36 AM    Chloride 109 (H) 10/25/2018 09:36 AM    CO2 25 10/25/2018 09:36 AM    Anion gap 7 10/25/2018 09:36 AM    Glucose 87 10/25/2018 09:36 AM    BUN 20 10/25/2018 09:36 AM    Creatinine 1.06 10/25/2018 09:36 AM    BUN/Creatinine ratio 19 10/25/2018 09:36 AM    GFR est AA >60 10/25/2018 09:36 AM    GFR est non-AA >60 10/25/2018 09:36 AM     Calcium 9.5 10/25/2018 09:36 AM      Wt  Readings from Last 3 Encounters:   06/03/18 288 lb 12.8 oz (131 kg)   06/02/18 290 lb (131.5 kg)   05/29/18 289 lb (131.1 kg)      BP Readings from Last 3 Encounters:   06/07/18 155/83   06/02/18 (!) 81/51   05/29/18 (!) 89/58        Current Outpatient Medications   Medication Sig   ??? pregabalin (LYRICA) 300 mg capsule TAKE 1 CAP BY MOUTH TWO (2) TIMES A DAY. MAX DAILY AMOUNT: 600 MG.   ??? atorvastatin (LIPITOR) 40 mg tablet Take 1 Tab by mouth daily. Indications: hardening of the arteries due to plaque buildup   ??? nebivoloL (Bystolic) 10 mg tablet TAKE 1 TABLET BY MOUTH EVERY DAY (Patient taking differently: Take 5 mg by mouth daily. TAKE 1 TABLET BY MOUTH EVERY DAY)   ??? pantoprazole (PROTONIX) 40 mg tablet Take 1 Tab by mouth daily. Indications: gastroesophageal reflux disease   ??? fexofenadine (ALLEGRA) 180 mg tablet Take 1 Tab by mouth daily.   ??? fluticasone propionate (FLONASE) 50 mcg/actuation nasal spray 2 Sprays by Both Nostrils route daily. Indications: inflammation of the nose due to an allergy   ??? cyanocobalamin (VITAMIN B12) 500 mcg tablet Take 1 Tab by mouth daily.   ??? diclofenac (VOLTAREN) 1 % gel Apply 2 g to affected area four (4) times daily as needed for Pain (Knee pain).   ??? DULoxetine (CYMBALTA) 60 mg capsule TAKE 1 CAPSULE BY MOUTH DAILY   ??? b complex vitamins tablet Take 1 Tab by mouth daily.   ??? co-enzyme Q-10 (CO Q-10) 100 mg capsule Take 100 mg by mouth daily.   ??? cpap machine kit by Does Not Apply route.     No current facility-administered medications for this visit.         Impression see above.        VIRTUAL VISIT DOCUMENTATION   Pursuant to the emergency declaration under the [place], 1135 waiver authority and the [employer] and [employer] Act, this Virtual  Visit was conducted, with patient's consent, to reduce the patient's risk  of exposure to COVID-19 and provide continuity of care for an established patient.  Services were provided through a video synchronous discussion virtually to substitute for in-person clinic visit.  We discussed the expected course, resolution and complications of the diagnosis(es) in detail.  Medication risks, benefits, costs, interactions, and alternatives were discussed as indicated.  I advised him to contact the office if his condition worsens, changes or fails to improve as anticipated. He expressed understanding with the diagnosis(es) and plan  I have reviewed the nurses notes, vitals, problem list, allergy list, medical history, family, social history and medications.   FOLLOW-UP   Patient was made aware and verbalized understanding that an appointment will be scheduled for them for a virtual visit and/or office visit within the above time frame. Patient understanding his/her responsibility to call and change time/date if he/she so chooses.  Cammy Copa, MD    [facility]  [address], [address]  [place], [place]    [place], [place]  [phone] / [phone] Fax  [phone] / [phone] Fax  This visit was conducted using [employer] telemedicine services or similar service.

## 2018-11-13 NOTE — Progress Notes (Signed)
See other note

## 2018-11-13 NOTE — Progress Notes (Signed)
Progress Notes by Cammy Copa, MD at 11/13/18 1640                Author: Cammy Copa, MD  Service: --  Author Type: Physician       Filed: 11/15/18 1141  Encounter Date: 11/13/2018  Status: Signed          Editor: Cammy Copa, MD (Physician)               Elijah [name]     [birth year]        Elijah [name] [name] MD, [facility]   Date of Visit-11/13/2018    PCP is Spence, Neale Burly, MD    [facility]   [facility]   Virtual Visit   HPI:  Elijah [name] is a 68 y.o. male    who was seen by synchronous (real-time) audio-video technology on 11/13/2018.        Fu of   Last OV 11-23-17   Now pre op urgent OV for podiatry at [facility] to operate on metatarasal   Hx of CABG 2011 in [place]. 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 Post surgery had fatigue with  BB, changed to Bystolic.   stress test in January 2018 with a fixed basal inferior defect and EF of 53%. Has lost 3 family members including son at young age, travels to [place]. They gave him a nuclear stress test and they suggested bariatric surgery. He was also told about some low blood flow and suggested a heart catheterization.                Today   Had gastric bypass and 100# weight loss   Seen by video with him in [place] buying a second home   has osteomyelitis in the right foot which has limited his activity/mobility. Pt's brother died of a heart attack.    Pt was to have joint surgery in February but was delayed. Last visit August 2019. Pt usually seen in [place]. Hx of CABG in 2011 in [place]. Stress test in January showed  a fixed basal inferior defect, EF 53%.      Pt's current surgery for preop is right first MTJ fusion with revision of wound from Dr. Jeneen Rinks at [facility].       Pt has a sore on his foot that he needs to have surgery on. From a cardiac standpoint, pt is doing well. Pt has lost  100 lbs since having bariatric surgery. Pt notes that his right leg will occasionally swell secondary to his foot wound. Pt's BP has been  more normal. He is taking half of a Bystolic now. Pt denies any chest discomfort or SOB. Pt is planning on moving to [place]. Pt takes a baby ASA.            1. Coronary artery disease involving native coronary artery of native heart without angina pectoris   Prior CABG. Previous stress test reviewed. He is now preop for surgery on the metatarsal joint. I have no objection to surgery. He has no angina.    I have no objection to the planned  surgery. Patient seems to be at a low risk for peri-operative severe adverse cardiac events.       2. Essential hypertension, benign   Now with bariatric surgery has had a 100 lb weight loss and BP is improved  to where he is no longer on Amlodipine and is on a half pill of Bystolic.       3. Pure hypercholesterolemia   Lipids on high potency statin as appropriate for secondary prevention.       4. Hx of CABG   5. OSA on CPAP         F/u in 1 year at [facility]           Previous hx or visit:         CABG x 5 2011      NUKE 04-07-16 Lexiscan   SPECT images demonstrate a medium, fixed abnormality of moderate degree in the basal inferior region on the stress and rest images. Gated SPECT images reveals normal myocardial thickening and wall motion. The left ventricular ejection fraction was calculated  to be 53 %.               Key CAD CHF Meds                             atorvastatin (LIPITOR) 40 mg tablet  (Taking)  Take 1 Tab by mouth daily. Indications: hardening of the arteries due to plaque buildup           nebivoloL (Bystolic) 10 mg tablet  (Taking)  TAKE 1 TABLET BY MOUTH EVERY DAY                 ROS-except as noted above.. A complete cardiac and respiratory are reviewed and negative except as above  ; Resp-denies wheezing  or productive cough,. Const- No unusual weight loss or fever; Neuro-no recent seizure or CVA ; GI- No BRBPR, abdom  pain, bloating ; GU- no  hematuria      Past Medical History:        Diagnosis  Date         ?  BPH (benign prostatic hyperplasia)       ?  CAD (coronary artery disease)            CABG x 5 2011- nuke with fixed basal inferior defect [facility]         ?  History of vascular access device  09/08/2016          [facility] VAT, 5 FR double, R brachial, 48 cm with 1 cm out, LTA         ?  History of vascular access device  06/07/2018          87fbard power solo picc line 44cm at 2cm out, 38 circ, placed by E Franco, no difficulties. r brachial         ?  HLD (hyperlipidemia)       ?  HTN (hypertension)       ?  [place]     ?  OSA (obstructive sleep apnea)       ?  Polyneuropathy       ?  Tubular adenoma of colon            x2 on colonoscopy 09/18/15         Social Hx= reports that he has quit smoking. His smoking use included cigarettes. He has never used smokeless tobacco. He reports that he does not drink alcohol or use drugs.       Due to this being a TeleHealth evaluation, many elements of the  physical examination are unable to be assessed.    Vitals if sent, or see HPI  There were no vitals taken for this visit.    General: Well developed, in no acute distress, cooperative and alert   HEENT: Pupils equal/round. No marked JVD visible on video.   Respiratory: No audible wheezing, no signs of respiratory distress, lips non cyanotic   Extremities:  No edema   Neuro: A&Ox3, speech clear, no facial droop, answering questions appropriately   Skin: Skin color is normal. No rashes or lesions. Non diaphoretic on visible skin during exam   Psych: mood and affect are appropriate and pleasant        Lab Results         Component  Value  Date/Time            Cholesterol, total  135  10/25/2018 09:36 AM       HDL Cholesterol  50  10/25/2018 09:36 AM       LDL, calculated  61.2  10/25/2018 09:36 AM       Triglyceride  119  10/25/2018 09:36 AM            CHOL/HDL Ratio  2.7   10/25/2018 09:36 AM          Lab Results         Component  Value  Date/Time            Sodium  141  10/25/2018 09:36 AM       Potassium  4.7  10/25/2018 09:36 AM       Chloride  109 (H)  10/25/2018 09:36 AM       CO2  25  10/25/2018 09:36 AM       Anion gap  7  10/25/2018 09:36 AM       Glucose  87  10/25/2018 09:36 AM       BUN  20  10/25/2018 09:36 AM       Creatinine  1.06  10/25/2018 09:36 AM       BUN/Creatinine ratio  19  10/25/2018 09:36 AM       GFR est AA  >60  10/25/2018 09:36 AM       GFR est non-AA  >60  10/25/2018 09:36 AM            Calcium  9.5  10/25/2018 09:36 AM           Wt Readings from Last 3 Encounters:        06/03/18  288 lb 12.8 oz (131 kg)     06/02/18  290 lb (131.5 kg)        05/29/18  289 lb (131.1 kg)           BP Readings from Last 3 Encounters:        06/07/18  155/83     06/02/18  (!) 81/51        05/29/18  (!) 89/58              Current Outpatient Medications        Medication  Sig         ?  pregabalin (LYRICA) 300 mg capsule  TAKE 1 CAP BY MOUTH TWO (2) TIMES A DAY. MAX DAILY AMOUNT: 600 MG.     ?  atorvastatin (LIPITOR) 40 mg tablet  Take 1 Tab by mouth daily. Indications: hardening of the arteries due to plaque buildup     ?  nebivoloL (Bystolic) 10  mg tablet  TAKE 1 TABLET BY MOUTH EVERY DAY (Patient taking differently: Take 5 mg by mouth daily. TAKE 1 TABLET BY MOUTH EVERY DAY)     ?  pantoprazole (PROTONIX) 40 mg tablet  Take 1 Tab by mouth daily. Indications: gastroesophageal reflux disease     ?  fexofenadine (ALLEGRA) 180 mg tablet  Take 1 Tab by mouth daily.     ?  fluticasone propionate (FLONASE) 50 mcg/actuation nasal spray  2 Sprays by Both Nostrils route daily. Indications: inflammation of the nose due to an allergy     ?  cyanocobalamin (VITAMIN B12) 500 mcg tablet  Take 1 Tab by mouth daily.     ?  diclofenac (VOLTAREN) 1 % gel  Apply 2 g to affected area four (4) times daily as needed for Pain (Knee pain).     ?  DULoxetine (CYMBALTA) 60 mg capsule  TAKE 1 CAPSULE  BY MOUTH DAILY     ?  b complex vitamins tablet  Take 1 Tab by mouth daily.     ?  co-enzyme Q-10 (CO Q-10) 100 mg capsule  Take 100 mg by mouth daily.         ?  cpap machine kit  by Does Not Apply route.          No current facility-administered medications for this visit.             Impression see above.               VIRTUAL VISIT DOCUMENTATION     Pursuant to the emergency declaration under the [place], 1135 waiver authority and the [employer] Act, this Virtual  Visit was conducted, with patient's consent, to reduce the patient's risk of exposure to COVID-19 and provide continuity of care for an established patient.   Services were provided through a video synchronous discussion virtually to substitute for in-person clinic visit.   We discussed the expected course, resolution and complications of the diagnosis(es) in detail.  Medication risks, benefits, costs, interactions, and alternatives were discussed as indicated.  I  advised him to contact the office if his condition worsens, changes or fails to improve as anticipated. He expressed understanding with the diagnosis(es) and plan   I have reviewed the nurses notes, vitals, problem list, allergy list, medical history, family, social history and medications.      FOLLOW-UP     Patient was made aware and verbalized understanding that an appointment will be scheduled for them for a virtual visit and/or office visit within the above  time frame. Patient understanding his/her responsibility to call and change time/date if he/she so chooses.   Cammy Copa, MD      [facility]   [address], [address], [address]   [place], [place]    [place], [place]   [phone] / [phone] Fax  [phone] / [phone] Fax   This visit was  conducted using [employer] telemedicine services or similar service.

## 2018-11-14 ENCOUNTER — Encounter: Attending: Specialist | Primary: Family Medicine

## 2018-11-14 NOTE — Telephone Encounter (Signed)
Patient calling about his clearance being faxed to [facility] surg center     He can be reached at [phone]    [facility]

## 2018-11-15 NOTE — Telephone Encounter (Signed)
Danley Danker w/ [facility] is checking the status of the patient's cardiac clearance. Please advise.    Phone #: [phone]  Fax #: [phone]  Thanks

## 2018-11-15 NOTE — Telephone Encounter (Signed)
Faxed last office note with clearance and received confirmation.

## 2018-11-20 ENCOUNTER — Encounter: Attending: Specialist | Primary: Family Medicine

## 2018-11-24 ENCOUNTER — Encounter

## 2018-11-24 MED ORDER — PREGABALIN 300 MG CAP
300 mg | ORAL_CAPSULE | Freq: Two times a day (BID) | ORAL | 0 refills | Status: DC
Start: 2018-11-24 — End: 2019-02-07

## 2018-11-24 MED ORDER — PANTOPRAZOLE 40 MG TAB, DELAYED RELEASE
40 mg | ORAL_TABLET | Freq: Every day | ORAL | 1 refills | Status: DC
Start: 2018-11-24 — End: 2019-03-07

## 2018-11-24 MED ORDER — ATORVASTATIN 40 MG TAB
40 mg | ORAL_TABLET | Freq: Every day | ORAL | 1 refills | Status: AC
Start: 2018-11-24 — End: ?

## 2018-11-24 MED ORDER — DULOXETINE 60 MG CAP, DELAYED RELEASE
60 mg | ORAL_CAPSULE | ORAL | 1 refills | Status: DC
Start: 2018-11-24 — End: 2018-12-20

## 2018-11-24 NOTE — Telephone Encounter (Signed)
Last office visit on 07/26/2018.

## 2018-11-24 NOTE — Telephone Encounter (Signed)
Patient left all of his medications at his vacation home. Please refill medication since Dr. Mliss Sax is out of the office. Dr. Mliss Sax called and said he tried to refill it himself on his home computer but could not get into the system.

## 2018-12-06 ENCOUNTER — Encounter: Attending: Specialist | Primary: Family Medicine

## 2018-12-06 MED ORDER — LISINOPRIL 10 MG TAB
10 mg | ORAL_TABLET | ORAL | 1 refills | Status: AC
Start: 2018-12-06 — End: ?

## 2018-12-20 MED ORDER — DULOXETINE 60 MG CAP, DELAYED RELEASE
60 mg | ORAL_CAPSULE | ORAL | 0 refills | Status: DC
Start: 2018-12-20 — End: 2018-12-26

## 2018-12-25 ENCOUNTER — Ambulatory Visit: Admit: 2018-12-25 | Discharge: 2018-12-25 | Payer: MEDICARE | Attending: Family Medicine | Primary: Family Medicine

## 2018-12-26 MED ORDER — DULOXETINE 60 MG CAP, DELAYED RELEASE
60 mg | ORAL_CAPSULE | ORAL | 0 refills | Status: DC
Start: 2018-12-26 — End: 2019-03-07

## 2018-12-26 NOTE — Telephone Encounter (Signed)
Pt rescheduled his appointment from yesterday. He is going out of town and needs to pick up his script please.

## 2018-12-29 ENCOUNTER — Telehealth: Attending: Family Medicine | Primary: Family Medicine

## 2018-12-29 ENCOUNTER — Telehealth: Admit: 2018-12-29 | Discharge: 2018-12-29 | Payer: MEDICARE | Attending: Family Medicine | Primary: Family Medicine

## 2018-12-29 DIAGNOSIS — L089 Local infection of the skin and subcutaneous tissue, unspecified: Secondary | ICD-10-CM

## 2018-12-29 NOTE — Progress Notes (Signed)
Elijah [name] is a 68 y.o. male evaluated via Telephone on 12/29/18.  Patient Identity confirmed by DOB.    Consent:  He and/or health care decision maker is aware that that he may receive a bill for this telephone service, depending on his insurance coverage, and has provided verbal consent to proceed: Yes    Physician Location: Office  Patient Location: Home  Nurse Assisting with Encounter: Vanessa Kick, LPN    Chief Complaint   Patient presents with   ??? Follow-up      Information gathered from patient and/or health care decision maker.    HPI:   Foot Issues: Patient has a history of Osteomyelitis in both feet.  Currently being treated for right foot infection and is on IV medication/ANtibiotic at this time.      Hypertension Follow up:  Currently Taking Lisinopril 10 mg.  The patient reports:  taking medications as instructed, no medication side effects noted, home BP monitoring in range of 433'I systolic over 95'J diastolic, no TIA's, no chest pain on exertion, no dyspnea on exertion, no swelling of ankles.     BP Readings from Last 3 Encounters:   06/07/18 155/83   06/02/18 (!) 81/51   05/29/18 (!) 89/58      Hypertriglyceridemia Follow up:   Cardiovascular risks for him are: existing CAD  hypertension  hyperlipidemia.   Currently he takes Lipitor (atorvastatin) , 40 mg.   Myalgias: NO   Fatigue: NO   Other side effects: NO     Wt Readings from Last 3 Encounters:   06/03/18 288 lb 12.8 oz (131 kg)   06/02/18 290 lb (131.5 kg)   05/29/18 289 lb (131.1 kg)     Gastroesophageal Reflux:  Current control of Symptoms: Stable, did notice getting mildly worse with Gastric Sleeve   Primary symptoms: heartburn  Hiatal Hernia: No  Current Medications: Protonix  The patient has no history melena or bright red blood in the stools.  The patient avoids high dose aspirin and NSAID therapy.  The patient is aware of diet changes needed, elevating the head of the bed and appropriate use of antacids.       Obesity: Gastric Sleeve completed about a year ago and has been doing great.      Review of Systems   Constitutional: Negative for chills and fever.   HENT: Negative for congestion.    Respiratory: Negative for cough.    Cardiovascular: Negative for chest pain.   Gastrointestinal: Negative for abdominal pain, nausea and vomiting.      Limited Exam:  Due to this being a TeleHealth evaluation, many elements of the physical examination are unable to be assessed.      Constitutional: Appears in no apparent distress   Mental status: Alert and awake, Oriented to person/place/time, Able to follow commands  Psychiatric: Normal affect, normal judgment/insight. No hallucinations     Current Outpatient Medications on File Prior to Visit   Medication Sig Dispense Refill   ??? DULoxetine (CYMBALTA) 60 mg capsule TAKE 1 CAPSULE BY MOUTH EVERY DAY 90 Cap 0   ??? lisinopriL (PRINIVIL, ZESTRIL) 10 mg tablet TAKE 1 TABLET BY MOUTH EVERY DAY 90 Tab 1   ??? pantoprazole (PROTONIX) 40 mg tablet Take 1 Tab by mouth daily. Indications: gastroesophageal reflux disease 90 Tab 1   ??? atorvastatin (LIPITOR) 40 mg tablet Take 1 Tab by mouth daily. Indications: hardening of the arteries due to plaque buildup 90 Tab 1   ??? pregabalin (LYRICA)  300 mg capsule Take 1 Cap by mouth two (2) times a day. Max Daily Amount: 600 mg. 60 Cap 0   ??? nebivoloL (Bystolic) 10 mg tablet TAKE 1 TABLET BY MOUTH EVERY DAY (Patient taking differently: Take 5 mg by mouth daily. TAKE 1 TABLET BY MOUTH EVERY DAY) 90 Tab 2   ??? cyanocobalamin (VITAMIN B12) 500 mcg tablet Take 1 Tab by mouth daily. 90 Tab 3   ??? diclofenac (VOLTAREN) 1 % gel Apply 2 g to affected area four (4) times daily as needed for Pain (Knee pain). 1 Each 4   ??? b complex vitamins tablet Take 1 Tab by mouth daily.     ??? co-enzyme Q-10 (CO Q-10) 100 mg capsule Take 100 mg by mouth daily.     ??? fexofenadine (ALLEGRA) 180 mg tablet Take 1 Tab by mouth daily. 90 Tab 1    ??? fluticasone propionate (FLONASE) 50 mcg/actuation nasal spray 2 Sprays by Both Nostrils route daily. Indications: inflammation of the nose due to an allergy 3 Bottle 2   ??? cpap machine kit by Does Not Apply route.       No current facility-administered medications on file prior to visit.         Allergies   Allergen Reactions   ??? Metoprolol Other (comments)     Patient states, made me feel horrible.        Patient Active Problem List    Diagnosis Date Noted   ??? Abscess of right foot 06/04/2018   ??? Acute osteomyelitis of left foot ([place]) 06/04/2018   ??? Cellulitis 06/02/2018   ??? Right foot infection 06/02/2018   ??? 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 Chronic fatigue 09/08/2017   ??? Polyneuropathy 05/28/2016   ??? Tubular adenoma of colon 09/23/2015   ??? Obesity 04/24/2015   ??? Essential hypertension, benign 02/21/2015   ??? HLD (hyperlipidemia) 02/21/2015   ??? Coronary atherosclerosis of native coronary artery 02/21/2015   ??? OSA on CPAP 02/21/2015   ??? Pure hypercholesterolemia 08/23/2013   ??? 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 Benign prostatic hyperplasia 12/29/2006        Health Maintenance Due   Topic Date Due   ??? FOBT Q1Y Age 39-75  12/22/1998   ??? Pneumococcal 65+ years (1 of 1 - PPSV23) 12/21/2013   ??? Shingrix Vaccine Age 9> (2 of 2) 05/04/2018   ??? Medicare Yearly Exam  11/19/2018   ??? Flu Vaccine (1) 11/28/2018        Assessment/Plan:  Details of this discussion including any medical advice provided: Overall doing well.  Will follow up after out of boot.  Not interested in PT at this time.     ICD-10-CM ICD-9-CM    1. Right foot infection  L08.9 686.9    2. Essential hypertension, benign  I10 401.1    3. Obesity, unspecified classification, unspecified obesity type, unspecified whether serious comorbidity present  E66.9 278.00    4. Pure hypercholesterolemia  E78.00 272.0      Follow-up and Dispositions    ?? Return in about 3 months (around 03/31/2019) for Chronic conditions.            Total Time: minutes: 11-20 minutes was spent on telemedicine encounter discussing above problems and plans.  Patient Problem list, medications, and Allergies were reviewed during this encounter.    Pursuant to the emergency declaration under the [place], 1135 waiver authority  and the Coronavirus Preparedness and Response Supplemental Appropriations Act, this Telephone Visit was conducted, with patient's consent, to reduce the patient's risk of exposure to COVID-19 and provide continuity of care for an established patient.     I affirm this is a Patient Initiated Episode with an Established Patient who has not had a related appointment within my department in the past 7 days or scheduled within the next 24 hours.    Discussed diagnoses in detail with patient. Medication risks/benefits/side effects discussed with patient. All of the patient's questions were addressed. The patient understands and agrees with our plan of care.  The patient knows to call back if they are unsure of or forget any changes we discussed today or if the symptoms change.    Note: not billable if this call serves to triage the patient into an appointment for the relevant concern    Vernia Buff, MD  [facility]  12/29/18

## 2018-12-29 NOTE — Progress Notes (Signed)
Chief Complaint   Patient presents with   ??? Follow-up     1. Have you been to the ER, urgent care clinic since your last visit?  Hospitalized since your last visit?Yes [place]    2. Have you seen or consulted any other health care providers outside of the [facility] since your last visit?  Include any pap smears or colon screening. No    Reviewed record in preparation for visit and have necessary documentation  Pt did not bring medication to office visit for review  opportunity was given for questions  Goals that were addressed and/or need to be completed during or after this appointment include   Health Maintenance Due   Topic Date Due   ??? FOBT Q1Y Age 50-75  12/22/1998   ??? Pneumococcal 65+ years (1 of 1 - PPSV23) 12/21/2013   ??? Shingrix Vaccine Age 50> (2 of 2) 05/04/2018   ??? Medicare Yearly Exam  11/19/2018   ??? Flu Vaccine (1) 11/28/2018

## 2018-12-29 NOTE — Progress Notes (Signed)
SAHITH NURSE is a 68 y.o. male evaluated via Telephone on 12/29/18.  Patient Identity confirmed by DOB.    Consent:  He and/or health care decision maker is aware that that he may receive a bill for this telephone service, depending on his insurance coverage, and has provided verbal consent to proceed: Yes    Physician Location: Office  Patient Location: Home  Nurse Assisting with Encounter: Vanessa Kick, LPN    Chief Complaint   Patient presents with   ??? Follow-up      Information gathered from patient and/or health care decision maker.    HPI:   Foot Issues: Patient has a history of Osteomyelitis in both feet.  Currently being treated for right foot infection and is on IV medication/ANtibiotic at this time.      Hypertension Follow up:  Currently Taking Lisinopril 10 mg.  The patient reports:  taking medications as instructed, no medication side effects noted, home BP monitoring in range of 798'X systolic over 21'J diastolic, no TIA's, no chest pain on exertion, no dyspnea on exertion, no swelling of ankles.     BP Readings from Last 3 Encounters:   06/07/18 155/83   06/02/18 (!) 81/51   05/29/18 (!) 89/58      Hypertriglyceridemia Follow up:   Cardiovascular risks for him are: existing CAD  hypertension  hyperlipidemia.   Currently he takes Lipitor (atorvastatin) , 40 mg.   Myalgias: NO   Fatigue: NO   Other side effects: NO     Wt Readings from Last 3 Encounters:   06/03/18 288 lb 12.8 oz (131 kg)   06/02/18 290 lb (131.5 kg)   05/29/18 289 lb (131.1 kg)     Gastroesophageal Reflux:  Current control of Symptoms: Stable, did notice getting mildly worse with Gastric Sleeve   Primary symptoms: heartburn  Hiatal Hernia: No  Current Medications: Protonix  The patient has no history melena or bright red blood in the stools.  The patient avoids high dose aspirin and NSAID therapy.  The patient is aware of diet changes needed, elevating the head of the bed and appropriate use of antacids.      Obesity: Gastric  Sleeve completed about a year ago and has been doing great.      Review of Systems   Constitutional: Negative for chills and fever.   HENT: Negative for congestion.    Respiratory: Negative for cough.    Cardiovascular: Negative for chest pain.   Gastrointestinal: Negative for abdominal pain, nausea and vomiting.      Limited Exam:  Due to this being a TeleHealth evaluation, many elements of the physical examination are unable to be assessed.      Constitutional: Appears in no apparent distress   Mental status: Alert and awake, Oriented to person/place/time, Able to follow commands  Psychiatric: Normal affect, normal judgment/insight. No hallucinations     Current Outpatient Medications on File Prior to Visit   Medication Sig Dispense Refill   ??? DULoxetine (CYMBALTA) 60 mg capsule TAKE 1 CAPSULE BY MOUTH EVERY DAY 90 Cap 0   ??? lisinopriL (PRINIVIL, ZESTRIL) 10 mg tablet TAKE 1 TABLET BY MOUTH EVERY DAY 90 Tab 1   ??? pantoprazole (PROTONIX) 40 mg tablet Take 1 Tab by mouth daily. Indications: gastroesophageal reflux disease 90 Tab 1   ??? atorvastatin (LIPITOR) 40 mg tablet Take 1 Tab by mouth daily. Indications: hardening of the arteries due to plaque buildup 90 Tab 1   ??? pregabalin (LYRICA)  300 mg capsule Take 1 Cap by mouth two (2) times a day. Max Daily Amount: 600 mg. 60 Cap 0   ??? nebivoloL (Bystolic) 10 mg tablet TAKE 1 TABLET BY MOUTH EVERY DAY (Patient taking differently: Take 5 mg by mouth daily. TAKE 1 TABLET BY MOUTH EVERY DAY) 90 Tab 2   ??? cyanocobalamin (VITAMIN B12) 500 mcg tablet Take 1 Tab by mouth daily. 90 Tab 3   ??? diclofenac (VOLTAREN) 1 % gel Apply 2 g to affected area four (4) times daily as needed for Pain (Knee pain). 1 Each 4   ??? b complex vitamins tablet Take 1 Tab by mouth daily.     ??? co-enzyme Q-10 (CO Q-10) 100 mg capsule Take 100 mg by mouth daily.     ??? fexofenadine (ALLEGRA) 180 mg tablet Take 1 Tab by mouth daily. 90 Tab 1   ??? fluticasone propionate (FLONASE) 50 mcg/actuation nasal  spray 2 Sprays by Both Nostrils route daily. Indications: inflammation of the nose due to an allergy 3 Bottle 2   ??? cpap machine kit by Does Not Apply route.       No current facility-administered medications on file prior to visit.         Allergies   Allergen Reactions   ??? Metoprolol Other (comments)     Patient states, made me feel horrible.        Patient Active Problem List    Diagnosis Date Noted   ??? Abscess of right foot 06/04/2018   ??? Acute osteomyelitis of left foot ([place]) 06/04/2018   ??? Cellulitis 06/02/2018   ??? Right foot infection 06/02/2018   ??? 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 Chronic fatigue 09/08/2017   ??? Polyneuropathy 05/28/2016   ??? Tubular adenoma of colon 09/23/2015   ??? Obesity 04/24/2015   ??? Essential hypertension, benign 02/21/2015   ??? HLD (hyperlipidemia) 02/21/2015   ??? Coronary atherosclerosis of native coronary artery 02/21/2015   ??? OSA on CPAP 02/21/2015   ??? Pure hypercholesterolemia 08/23/2013   ??? 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 Benign prostatic hyperplasia 12/29/2006        Health Maintenance Due   Topic Date Due   ??? FOBT Q1Y Age 68-75  12/22/1998   ??? Pneumococcal 65+ years (1 of 1 - PPSV23) 12/21/2013   ??? Shingrix Vaccine Age 71> (2 of 2) 05/04/2018   ??? Medicare Yearly Exam  11/19/2018   ??? Flu Vaccine (1) 11/28/2018        Assessment/Plan:  Details of this discussion including any medical advice provided: Overall doing well.  Will follow up after out of boot.  Not interested in PT at this time.     ICD-10-CM ICD-9-CM    1. Right foot infection  L08.9 686.9    2. Essential hypertension, benign  I10 401.1    3. Obesity, unspecified classification, unspecified obesity type, unspecified whether serious comorbidity present  E66.9 278.00    4. Pure hypercholesterolemia  E78.00 272.0      Follow-up and Dispositions    ?? Return in about 3 months (around 03/31/2019) for Chronic conditions.           Total Time: minutes: 11-20 minutes was spent on  telemedicine encounter discussing above problems and plans.  Patient Problem list, medications, and Allergies were reviewed during this encounter.    Pursuant to the emergency declaration under the [place], 1135 waiver authority  and the Coronavirus Preparedness and Response Supplemental Appropriations Act, this Telephone Visit was conducted, with patient's consent, to reduce the patient's risk of exposure to COVID-19 and provide continuity of care for an established patient.     I affirm this is a Patient Initiated Episode with an Established Patient who has not had a related appointment within my department in the past 7 days or scheduled within the next 24 hours.    Discussed diagnoses in detail with patient. Medication risks/benefits/side effects discussed with patient. All of the patient's questions were addressed. The patient understands and agrees with our plan of care.  The patient knows to call back if they are unsure of or forget any changes we discussed today or if the symptoms change.    Note: not billable if this call serves to triage the patient into an appointment for the relevant concern    Vernia Buff, MD  [facility]  12/29/18

## 2018-12-29 NOTE — Progress Notes (Signed)
 Chief Complaint   Patient presents with   . Follow-up     1. Have you been to the ER, urgent care clinic since your last visit?  Hospitalized since your last visit?Yes [name]    2. Have you seen or consulted any other health care providers outside of the [facility] since your last visit?  Include any pap smears or colon screening. No    Reviewed record in preparation for visit and have necessary documentation  Pt did not bring medication to office visit for review  opportunity was given for questions  Goals that were addressed and/or need to be completed during or after this appointment include   Health Maintenance Due   Topic Date Due   . FOBT Q1Y Age 26-75  12/22/1998   . Pneumococcal 65+ years (1 of 1 - PPSV23) 12/21/2013   . Shingrix Vaccine Age 46> (2 of 2) 05/04/2018   . Medicare Yearly Exam  11/19/2018   . Flu Vaccine (1) 11/28/2018

## 2019-02-07 ENCOUNTER — Encounter

## 2019-02-07 MED ORDER — PREGABALIN 300 MG CAP
300 mg | ORAL_CAPSULE | Freq: Two times a day (BID) | ORAL | 2 refills | Status: DC
Start: 2019-02-07 — End: 2019-03-07

## 2019-03-02 ENCOUNTER — Encounter: Attending: Specialist | Primary: Family Medicine

## 2019-03-07 ENCOUNTER — Telehealth: Attending: Family Medicine | Primary: Family Medicine

## 2019-03-07 ENCOUNTER — Telehealth: Admit: 2019-03-07 | Discharge: 2019-03-07 | Payer: MEDICARE | Attending: Family Medicine | Primary: Family Medicine

## 2019-03-07 DIAGNOSIS — E669 Obesity, unspecified: Secondary | ICD-10-CM

## 2019-03-07 MED ORDER — PANTOPRAZOLE 40 MG TAB, DELAYED RELEASE
40 mg | ORAL_TABLET | Freq: Every day | ORAL | 1 refills | Status: DC
Start: 2019-03-07 — End: 2019-12-10

## 2019-03-07 NOTE — Progress Notes (Signed)
Elijah [name] is a 68 y.o. male evaluated via Doximetry on 03/07/19.  Patient Identity confirmed by DOB.    Consent:  He and/or health care decision maker is aware that that he may receive a bill for this telephone service, depending on his insurance coverage, and has provided verbal consent to proceed: Yes    Physician Location: Office  Patient Location: Home  Nurse Assisting with Encounter: Vanessa Kick, LPN    Chief Complaint   Patient presents with   ??? Medication Refill      Information gathered from patient and/or health care decision maker.    HPI:   HTN: Patient Has been decreasing Bystolic, currently taking about a half-dose.  Patient reports weight is 220 as of yesterday.      Obesity: Weight significantly decreased with gastric sleeve.  Feeling well overall.  Appetite is decreased overall.     Gastroesophageal Reflux:  Current control of Symptoms: Stable  Primary symptoms: heartburn  Hiatal Hernia: No  Current Medications: Protonix  The patient has no history melena or bright red blood in the stools.  The patient avoids high dose aspirin and NSAID therapy.  The patient is aware of diet changes needed, elevating the head of the bed and appropriate use of antacids.          Review of Systems   Constitutional: Negative for chills and fever.   HENT: Negative for congestion.    Respiratory: Negative for cough.    Cardiovascular: Negative for chest pain and palpitations.   Musculoskeletal: Negative for myalgias.   Neurological: Negative for dizziness and headaches.        Limited Exam:  Due to this being a TeleHealth evaluation, many elements of the physical examination are unable to be assessed.      Constitutional: Appears well-developed and well-nourished in no apparent distress   Mental status: Alert and awake, Oriented to person/place/time, Able to follow commands  Eyes: EOM normal, Sclera normal, No visible ocular discharge  HENT: Normocephalic, atraumatic; Mouth/Throat: Moist mucous membranes,  External Ears normal  Neck: No visualized mass  Pulmonary/Chest: Respiratory effort normal, No visualized signs of difficulty breathing or respiratory distress   Musculoskeletal: Normal gait with no signs of ataxia, Normal range of motion of neck  Neurological: No facial asymmetry (Cranial nerve 7 motor function), No gaze palsy  Skin: No significant exanthematous lesions or discoloration noted on facial skin  Psychiatric: Normal affect, normal judgment/insight. No hallucinations     Current Outpatient Medications on File Prior to Visit   Medication Sig Dispense Refill   ??? Alpha Lipoic Acid 600 mg cap Take 600 mg by mouth daily.     ??? atorvastatin (LIPITOR) 40 mg tablet Take 1 Tab by mouth daily. Indications: hardening of the arteries due to plaque buildup 90 Tab 1   ??? nebivoloL (Bystolic) 10 mg tablet TAKE 1 TABLET BY MOUTH EVERY DAY (Patient taking differently: Take 5 mg by mouth daily. TAKE 1 TABLET BY MOUTH EVERY DAY) 90 Tab 2   ??? cyanocobalamin (VITAMIN B12) 500 mcg tablet Take 1 Tab by mouth daily. 90 Tab 3   ??? b complex vitamins tablet Take 1 Tab by mouth daily.     ??? co-enzyme Q-10 (CO Q-10) 100 mg capsule Take 100 mg by mouth daily.     ??? [DISCONTINUED] pregabalin (LYRICA) 300 mg capsule Take 1 Cap by mouth two (2) times a day. Max Daily Amount: 600 mg. 60 Cap 2   ??? [DISCONTINUED] DULoxetine (CYMBALTA)  60 mg capsule TAKE 1 CAPSULE BY MOUTH EVERY DAY 90 Cap 0   ??? lisinopriL (PRINIVIL, ZESTRIL) 10 mg tablet TAKE 1 TABLET BY MOUTH EVERY DAY 90 Tab 1   ??? [DISCONTINUED] pantoprazole (PROTONIX) 40 mg tablet Take 1 Tab by mouth daily. Indications: gastroesophageal reflux disease 90 Tab 1   ??? [DISCONTINUED] fexofenadine (ALLEGRA) 180 mg tablet Take 1 Tab by mouth daily. 90 Tab 1   ??? [DISCONTINUED] fluticasone propionate (FLONASE) 50 mcg/actuation nasal spray 2 Sprays by Both Nostrils route daily. Indications: inflammation of the nose due to an allergy 3 Bottle 2    ??? [DISCONTINUED] diclofenac (VOLTAREN) 1 % gel Apply 2 g to affected area four (4) times daily as needed for Pain (Knee pain). 1 Each 4   ??? cpap machine kit by Does Not Apply route.       No current facility-administered medications on file prior to visit.         Allergies   Allergen Reactions   ??? Metoprolol Other (comments)     Patient states, made me feel horrible.        Patient Active Problem List    Diagnosis Date Noted   ??? Abscess of right foot 06/04/2018   ??? Acute osteomyelitis of left foot ([place]) 06/04/2018   ??? Cellulitis 06/02/2018   ??? Right foot infection 06/02/2018   ??? 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 Chronic fatigue 09/08/2017   ??? Polyneuropathy 05/28/2016   ??? Tubular adenoma of colon 09/23/2015   ??? Obesity 04/24/2015   ??? Essential hypertension, benign 02/21/2015   ??? HLD (hyperlipidemia) 02/21/2015   ??? Coronary atherosclerosis of native coronary artery 02/21/2015   ??? OSA on CPAP 02/21/2015   ??? Pure hypercholesterolemia 08/23/2013   ??? 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 Benign prostatic hyperplasia 12/29/2006        Health Maintenance Due   Topic Date Due   ??? Colorectal Cancer Screening Combo  12/22/1998   ??? Medicare Yearly Exam  11/19/2018        Assessment/Plan:   Details of this discussion including any medical advice provided: Refill on Protonix    ICD-10-CM ICD-9-CM    1. Obesity, unspecified classification, unspecified obesity type, unspecified whether serious comorbidity present  E66.9 278.00 Alpha Lipoic Acid 600 mg cap   2. Gastroesophageal reflux disease without esophagitis  K21.9 530.81 pantoprazole (PROTONIX) 40 mg tablet   3. Essential hypertension, benign  I10 401.1      Follow-up and Dispositions    ?? Return if symptoms worsen or fail to improve.           Total Time: minutes: 21-30 minutes was spent on telemedicine encounter discussing above problems and plans.  Patient Problem list, medications, and Allergies were reviewed during this encounter.     Pursuant to the emergency declaration under the [place], 1135 waiver authority and the [employer] Act, this Telephone Visit was conducted, with patient's consent, to reduce the patient's risk of exposure to COVID-19 and provide continuity of care for an established patient.     I affirm this is a Patient Initiated Episode with an Established Patient who has not had a related appointment within my department in the past 7 days or scheduled within the next 24 hours.    Discussed diagnoses in detail with patient. Medication risks/benefits/side effects discussed with patient. All of the patient's questions were addressed.  The patient understands and agrees with our plan of care.  The patient knows to call back if they are unsure of or forget any changes we discussed today or if the symptoms change.    Note: not billable if this call serves to triage the patient into an appointment for the relevant concern    Vernia Buff, MD  [facility]  03/07/19

## 2019-03-07 NOTE — Progress Notes (Signed)
Chief Complaint   Patient presents with   ??? Medication Refill     1. Have you been to the ER, urgent care clinic since your last visit?  Hospitalized since your last visit?No    2. Have you seen or consulted any other health care providers outside of the  Health System since your last visit?  Include any pap smears or colon screening. No    Reviewed record in preparation for visit and have necessary documentation  Pt did not bring medication to office visit for review  opportunity was given for questions  Goals that were addressed and/or need to be completed during or after this appointment include   Health Maintenance Due   Topic Date Due   ??? Colorectal Cancer Screening Combo  12/22/1998   ??? Medicare Yearly Exam  11/19/2018

## 2019-03-07 NOTE — Progress Notes (Signed)
Elijah [name] is a 68 y.o. male evaluated via Doximetry on 03/07/19.  Patient Identity confirmed by DOB.    Consent:  He and/or health care decision maker is aware that that he may receive a bill for this telephone service, depending on his insurance coverage, and has provided verbal consent to proceed: Yes    Physician Location: Office  Patient Location: Home  Nurse Assisting with Encounter: Vanessa Kick, LPN    Chief Complaint   Patient presents with   ??? Medication Refill      Information gathered from patient and/or health care decision maker.    HPI:   HTN: Patient Has been decreasing Bystolic, currently taking about a half-dose.  Patient reports weight is 220 as of yesterday.      Obesity: Weight significantly decreased with gastric sleeve.  Feeling well overall.  Appetite is decreased overall.     Gastroesophageal Reflux:  Current control of Symptoms: Stable  Primary symptoms: heartburn  Hiatal Hernia: No  Current Medications: Protonix  The patient has no history melena or bright red blood in the stools.  The patient avoids high dose aspirin and NSAID therapy.  The patient is aware of diet changes needed, elevating the head of the bed and appropriate use of antacids.          Review of Systems   Constitutional: Negative for chills and fever.   HENT: Negative for congestion.    Respiratory: Negative for cough.    Cardiovascular: Negative for chest pain and palpitations.   Musculoskeletal: Negative for myalgias.   Neurological: Negative for dizziness and headaches.        Limited Exam:  Due to this being a TeleHealth evaluation, many elements of the physical examination are unable to be assessed.      Constitutional: Appears well-developed and well-nourished in no apparent distress   Mental status: Alert and awake, Oriented to person/place/time, Able to follow commands  Eyes: EOM normal, Sclera normal, No visible ocular discharge  HENT: Normocephalic, atraumatic; Mouth/Throat: Moist mucous membranes,  External Ears normal  Neck: No visualized mass  Pulmonary/Chest: Respiratory effort normal, No visualized signs of difficulty breathing or respiratory distress   Musculoskeletal: Normal gait with no signs of ataxia, Normal range of motion of neck  Neurological: No facial asymmetry (Cranial nerve 7 motor function), No gaze palsy  Skin: No significant exanthematous lesions or discoloration noted on facial skin  Psychiatric: Normal affect, normal judgment/insight. No hallucinations     Current Outpatient Medications on File Prior to Visit   Medication Sig Dispense Refill   ??? Alpha Lipoic Acid 600 mg cap Take 600 mg by mouth daily.     ??? atorvastatin (LIPITOR) 40 mg tablet Take 1 Tab by mouth daily. Indications: hardening of the arteries due to plaque buildup 90 Tab 1   ??? nebivoloL (Bystolic) 10 mg tablet TAKE 1 TABLET BY MOUTH EVERY DAY (Patient taking differently: Take 5 mg by mouth daily. TAKE 1 TABLET BY MOUTH EVERY DAY) 90 Tab 2   ??? cyanocobalamin (VITAMIN B12) 500 mcg tablet Take 1 Tab by mouth daily. 90 Tab 3   ??? b complex vitamins tablet Take 1 Tab by mouth daily.     ??? co-enzyme Q-10 (CO Q-10) 100 mg capsule Take 100 mg by mouth daily.     ??? [DISCONTINUED] pregabalin (LYRICA) 300 mg capsule Take 1 Cap by mouth two (2) times a day. Max Daily Amount: 600 mg. 60 Cap 2   ??? [DISCONTINUED] DULoxetine (CYMBALTA)  60 mg capsule TAKE 1 CAPSULE BY MOUTH EVERY DAY 90 Cap 0   ??? lisinopriL (PRINIVIL, ZESTRIL) 10 mg tablet TAKE 1 TABLET BY MOUTH EVERY DAY 90 Tab 1   ??? [DISCONTINUED] pantoprazole (PROTONIX) 40 mg tablet Take 1 Tab by mouth daily. Indications: gastroesophageal reflux disease 90 Tab 1   ??? [DISCONTINUED] fexofenadine (ALLEGRA) 180 mg tablet Take 1 Tab by mouth daily. 90 Tab 1   ??? [DISCONTINUED] fluticasone propionate (FLONASE) 50 mcg/actuation nasal spray 2 Sprays by Both Nostrils route daily. Indications: inflammation of the nose due to an allergy 3 Bottle 2   ??? [DISCONTINUED] diclofenac (VOLTAREN) 1 % gel Apply  2 g to affected area four (4) times daily as needed for Pain (Knee pain). 1 Each 4   ??? cpap machine kit by Does Not Apply route.       No current facility-administered medications on file prior to visit.         Allergies   Allergen Reactions   ??? Metoprolol Other (comments)     Patient states, made me feel horrible.        Patient Active Problem List    Diagnosis Date Noted   ??? Abscess of right foot 06/04/2018   ??? Acute osteomyelitis of left foot ([place]) 06/04/2018   ??? Cellulitis 06/02/2018   ??? Right foot infection 06/02/2018   ??? 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 Chronic fatigue 09/08/2017   ??? Polyneuropathy 05/28/2016   ??? Tubular adenoma of colon 09/23/2015   ??? Obesity 04/24/2015   ??? Essential hypertension, benign 02/21/2015   ??? HLD (hyperlipidemia) 02/21/2015   ??? Coronary atherosclerosis of native coronary artery 02/21/2015   ??? OSA on CPAP 02/21/2015   ??? Pure hypercholesterolemia 08/23/2013   ??? 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 Benign prostatic hyperplasia 12/29/2006        Health Maintenance Due   Topic Date Due   ??? Colorectal Cancer Screening Combo  12/22/1998   ??? Medicare Yearly Exam  11/19/2018        Assessment/Plan:   Details of this discussion including any medical advice provided: Refill on Protonix    ICD-10-CM ICD-9-CM    1. Obesity, unspecified classification, unspecified obesity type, unspecified whether serious comorbidity present  E66.9 278.00 Alpha Lipoic Acid 600 mg cap   2. Gastroesophageal reflux disease without esophagitis  K21.9 530.81 pantoprazole (PROTONIX) 40 mg tablet   3. Essential hypertension, benign  I10 401.1      Follow-up and Dispositions    ?? Return if symptoms worsen or fail to improve.           Total Time: minutes: 21-30 minutes was spent on telemedicine encounter discussing above problems and plans.  Patient Problem list, medications, and Allergies were reviewed during this encounter.    Pursuant to the emergency declaration under the  [place], 1135 waiver authority and the [employer] Act, this Telephone Visit was conducted, with patient's consent, to reduce the patient's risk of exposure to COVID-19 and provide continuity of care for an established patient.     I affirm this is a Patient Initiated Episode with an Established Patient who has not had a related appointment within my department in the past 7 days or scheduled within the next 24 hours.    Discussed diagnoses in detail with patient. Medication risks/benefits/side effects discussed with patient. All of the patient's questions were addressed.  The patient understands and agrees with our plan of care.  The patient knows to call back if they are unsure of or forget any changes we discussed today or if the symptoms change.    Note: not billable if this call serves to triage the patient into an appointment for the relevant concern    Vernia Buff, MD  [facility]  03/07/19

## 2019-03-07 NOTE — Progress Notes (Signed)
Chief Complaint   Patient presents with   . Medication Refill     1. Have you been to the ER, urgent care clinic since your last visit?  Hospitalized since your last visit?No    2. Have you seen or consulted any other health care providers outside of the [facility] since your last visit?  Include any pap smears or colon screening. No    Reviewed record in preparation for visit and have necessary documentation  Pt did not bring medication to office visit for review  opportunity was given for questions  Goals that were addressed and/or need to be completed during or after this appointment include   Health Maintenance Due   Topic Date Due   . Colorectal Cancer Screening Combo  12/22/1998   . Medicare Yearly Exam  11/19/2018

## 2019-03-29 ENCOUNTER — Telehealth: Attending: Family Medicine | Primary: Family Medicine

## 2019-03-29 ENCOUNTER — Telehealth: Admit: 2019-03-29 | Payer: MEDICARE | Attending: Family Medicine | Primary: Family Medicine

## 2019-03-29 DIAGNOSIS — N401 Enlarged prostate with lower urinary tract symptoms: Secondary | ICD-10-CM

## 2019-03-29 MED ORDER — TAMSULOSIN SR 0.4 MG 24 HR CAP
0.4 mg | ORAL_CAPSULE | Freq: Every day | ORAL | 0 refills | Status: AC
Start: 2019-03-29 — End: ?

## 2019-03-29 NOTE — Progress Notes (Signed)
I reviewed with the resident the medical history.  I discussed with the resident the patient's diagnosis and concur with the plan.

## 2019-03-29 NOTE — Progress Notes (Signed)
[name]  68 y.o. male  October 08, 1950  [address]  [place] [address]  [id number]   [facility]:    Telemedicine Progress Note  Gustavus Bryant, MD       Encounter Date and Time: March 29, 2019 at 2:15 PM    Consent:  He and/or the health care decision maker is aware that that he may receive a bill for this telephone/video service, depending on his insurance coverage, and has provided verbal consent to proceed: Yes    Chief Complaint   Patient presents with   ??? Benign Prostatic Hypertrophy     History of Present Illness   Elijah [name] is a 68 y.o. male was evaluated by synchronous (real-time) audio-video technology from home. Pt presents of BPH and urinary frequency. Has had BPH for a while, and now having to get up to go to the bathroom 3-4 times per night and is very bothersome. Denies any dysuria or hematuria. Would like to try medication for this problem. Denies any fever, chills, cough, SOB, sick contacts, or recent travel.       Review of Systems   Review of Systems   Constitutional: Negative for chills and fever.   HENT: Negative for congestion and sore throat.    Respiratory: Negative for cough and shortness of breath.    Gastrointestinal: Negative for abdominal pain, nausea and vomiting.   Genitourinary: Positive for frequency. Negative for dysuria and hematuria.   Neurological: Negative for dizziness and headaches.       Vitals/Objective:     General: alert, cooperative, no distress   Mental  status: mental status: alert, oriented to person, place, and time, normal mood, behavior, speech, dress, motor activity, and thought processes   Resp: resp: normal effort, no respiratory distress and not coughing   Neuro: neuro: no gross deficits   Skin: skin: no discoloration or lesions of concern on visible areas   Due to this being a TeleHealth evaluation, many elements of the physical examination are unable to be assessed.      Assessment and Plan:    Time-based coding, delete if not needed: I spent at least 15 minutes with this established patient, and >50% of the time was spent counseling and/or coordinating care regarding BPH    Elijah [name] is a 68 y.o. male with enlarged prostate and urinary frequency    1. Benign prostatic hyperplasia with urinary frequency - worsening, urinary frequency, has not tried medication for this before, will try Flomax  -START tamsulosin (FLOMAX) 0.4 mg capsule; Take 1 Cap by mouth daily. Take 30 minutes after same meal daily for enlarged prostate.  Dispense: 30 Cap; Refill: 0        Time spent in direct conversation with the patient to include medical condition(s) discussed, assessment and treatment plan:       We discussed the expected course, resolution and complications of the diagnosis(es) in detail.  Medication risks, benefits, costs, interactions, and alternatives were discussed as indicated.  I advised him to contact the office if his condition worsens, changes or fails to improve as anticipated. He expressed understanding with the diagnosis(es) and plan. Patient understands that this encounter was a temporary measure, and the importance of further follow up and examination was emphasized.  Patient verbalized understanding.       Patient informed to follow up:   Follow-up and Dispositions  ??   Return in about 1 month (around 04/29/2019), or if symptoms  worsen or fail to improve, for BPH.         Electronically Signed: Gustavus Bryant, MD    Providers location when delivering service (clinic, hospital, home): Home      ICD-10-CM ICD-9-CM    1. Benign prostatic hyperplasia with urinary frequency  N40.1 600.01 tamsulosin (FLOMAX) 0.4 mg capsule    R35.0 788.41           Pursuant to the emergency declaration under the [place], 1135 waiver authority and the [employer] Act, this Virtual  Visit was conducted, with patient's consent, to reduce the patient's risk of exposure to COVID-19 and provide continuity of care for an established patient.     Services were provided through a video synchronous discussion virtually to substitute for in-person clinic visit.   History   Patients past medical, surgical and family histories were reviewed.    Past Medical History:   Diagnosis Date   ??? BPH (benign prostatic hyperplasia)    ??? CAD (coronary artery disease)     CABG x 5 2011- [place] with [place] basal inferior [place]   ??? History of vascular access device 09/08/2016    [facility] VAT, 5 FR double, R brachial, 48 cm with 1 cm out, LTA   ??? History of vascular access device 06/07/2018    74fbard power solo picc line 44cm at 2cm out, 38 circ, placed by E Franco, no difficulties. r brachial   ??? HLD (hyperlipidemia)    ??? HTN (hypertension)    ??? Morbid obesity due to excess calories ([facility] 04/24/2015   ??? OSA (obstructive sleep apnea)    ??? Polyneuropathy    ??? Tubular adenoma of colon     x2 on colonoscopy 09/18/15     Past Surgical History:   Procedure Laterality Date   ??? HX CORONARY ARTERY BYPASS GRAFT     ??? HX GASTRIC BYPASS  01/2018    gastric sleeve   ??? HX HIP REPLACEMENT Bilateral     10 years ago   ??? HX ORTHOPAEDIC      right great and 2nd toe surgery     Family History   Problem Relation Age of Onset   ??? Stroke Mother    ??? Other Father         mrsa infxn caused death   ??? Heart Attack Brother      Social History     Socioeconomic History   ??? Marital status: MARRIED     Spouse name: Not on file   ??? Number of children: Not on file   ??? Years of education: Not on file   ??? Highest education level: Not on file   Occupational History   ??? Not on file   Social Needs    ??? Financial resource strain: Not on file   ??? Food insecurity     Worry: Not on file     Inability: Not on file   ??? Transportation needs     Medical: Not on file     Non-medical: Not on file   Tobacco Use   ??? Smoking status: Former Smoker     Types: Cigarettes   ??? Smokeless tobacco: Never Used   Substance and Sexual Activity   ??? Alcohol use: No     Comment: recently has stopped   ??? Drug use: No   ??? Sexual activity: Not Currently   Lifestyle   ???  Physical activity     Days per week: Not on file     Minutes per session: Not on file   ??? Stress: Not on file   Relationships   ??? Social Product manager on phone: Not on file     Gets together: Not on file     Attends religious service: Not on file     Active member of club or organization: Not on file     Attends meetings of clubs or organizations: Not on file     Relationship status: Not on file   ??? Intimate partner violence     Fear of current or ex partner: Not on file     Emotionally abused: Not on file     Physically abused: Not on file     Forced sexual activity: Not on file   Other Topics Concern   ??? Military Service Not Asked   ??? Blood Transfusions Not Asked   ??? Caffeine Concern Not Asked   ??? Occupational Exposure Not Asked   ??? Hobby Hazards Not Asked   ??? Sleep Concern Not Asked   ??? Stress Concern Not Asked   ??? Weight Concern Not Asked   ??? Special Diet Not Asked   ??? Back Care Not Asked   ??? Exercise Not Asked   ??? Bike Helmet Not Asked   ??? Seat Belt Not Asked   ??? Self-Exams Not Asked   Social History Narrative   ??? Not on file     Patient Active Problem List   Diagnosis Code   ??? Essential hypertension, benign I10   ??? HLD (hyperlipidemia) E78.5   ??? Coronary atherosclerosis of native coronary artery I25.10   ??? OSA on CPAP G47.33, Z99.89   ??? Obesity E66.9   ??? Tubular adenoma of colon D12.6   ??? Polyneuropathy G62.9   ??? Benign prostatic hyperplasia N40.0   ??? Pure hypercholesterolemia E78.00   ??? 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 Chronic fatigue R53.82    ??? Complications of gastric bypass surgery K91.89, Y83.2   ??? Cellulitis L03.90   ??? Right foot infection L08.9   ??? Abscess of right foot L02.611   ??? Acute osteomyelitis of left foot ([facility]) M86.172          Current Medications/Allergies   Medications and Allergies reviewed:    Current Outpatient Medications   Medication Sig Dispense Refill   ??? tamsulosin (FLOMAX) 0.4 mg capsule Take 1 Cap by mouth daily. Take 30 minutes after same meal daily for enlarged prostate. 30 Cap 0   ??? Alpha Lipoic Acid 600 mg cap Take 600 mg by mouth daily.     ??? pantoprazole (PROTONIX) 40 mg tablet Take 1 Tab by mouth daily. Indications: gastroesophageal reflux disease 90 Tab 1   ??? lisinopriL (PRINIVIL, ZESTRIL) 10 mg tablet TAKE 1 TABLET BY MOUTH EVERY DAY 90 Tab 1   ??? atorvastatin (LIPITOR) 40 mg tablet Take 1 Tab by mouth daily. Indications: hardening of the arteries due to plaque buildup 90 Tab 1   ??? nebivoloL (Bystolic) 10 mg tablet TAKE 1 TABLET BY MOUTH EVERY DAY (Patient taking differently: Take 5 mg by mouth daily. TAKE 1 TABLET BY MOUTH EVERY DAY) 90 Tab 2   ??? cyanocobalamin (VITAMIN B12) 500 mcg tablet Take 1 Tab by mouth daily. 90 Tab 3   ??? b complex vitamins tablet Take 1 Tab by mouth daily.     ???  co-enzyme Q-10 (CO Q-10) 100 mg capsule Take 100 mg by mouth daily.     ??? cpap machine kit by Does Not Apply route.       Allergies   Allergen Reactions   ??? Metoprolol Other (comments)     Patient states, made me feel horrible.

## 2019-03-29 NOTE — Progress Notes (Signed)
Progress Notes by Gustavus Bryant, MD at 03/29/19 1415                Author: Gustavus Bryant, MD  Service: --  Author Type: Resident       Filed: 03/31/19 1253  Encounter Date: 03/29/2019  Status: Signed          Editor: Gustavus Bryant, MD (Resident)                  Elijah [name]   68 y.o. male   [birth year]   [address]   [place] [address]   [id number]       [facility]:     Telemedicine Progress Note   Gustavus Bryant, MD           Encounter Date and Time: March 29, 2019 at 2:15 PM      Consent:   He and/or the health care decision maker is aware that that he  may receive a bill for this telephone/video service, depending on his insurance coverage, and has provided verbal consent to proceed:  Yes      Chief Complaint      Patient presents with      ?  Benign Prostatic Hypertrophy            History of Present Illness         Elijah [name] is a 69 y.o.  male was evaluated by synchronous (real-time) audio-video technology from home. Pt presents of BPH and urinary frequency. Has had BPH for a  while, and now having to get up to go to the bathroom 3-4 times per night and is very bothersome. Denies any dysuria or hematuria. Would like to try medication for this problem. Denies any fever, chills, cough, SOB, sick contacts, or recent travel.          Review of Systems         Review of Systems    Constitutional: Negative for chills and fever.    HENT: Negative for congestion and sore throat.     Respiratory: Negative for cough and shortness of breath.     Gastrointestinal: Negative for abdominal pain, nausea and vomiting.    Genitourinary: Positive for frequency. Negative for dysuria and hematuria.    Neurological: Negative for dizziness and headaches.          Vitals/Objective:            General:  alert, cooperative, no distress      Mental  status:  mental status: alert, oriented to person, place, and time, normal mood, behavior, speech, dress, motor  activity, and thought processes      Resp:  resp: normal effort, no respiratory distress and not coughing      Neuro:  neuro: no gross deficits      Skin:  skin: no discoloration or lesions of concern on visible areas         Due to this being a TeleHealth evaluation, many elements of the physical examination are unable to be assessed.         Assessment and Plan:         Time-based coding, delete if not needed: I spent at least 15 minutes with this established patient, and >50% of the time was spent counseling and/or coordinating  care regarding BPH      Elijah [name] is a 68  y.o. male with enlarged prostate and urinary  frequency      1. Benign prostatic hyperplasia with urinary frequency - worsening, urinary frequency, has not tried medication for this before, will try Flomax   -START tamsulosin (FLOMAX) 0.4 mg capsule; Take 1 Cap by mouth daily. Take 30 minutes after same meal daily for enlarged prostate.  Dispense: 30 Cap; Refill: 0            Time spent in direct conversation with the patient to include medical condition(s) discussed, assessment and treatment plan:          We discussed the expected course, resolution and complications of the diagnosis(es) in detail.  Medication risks, benefits, costs, interactions, and alternatives were discussed as indicated.  I advised  him to contact the office if his condition worsens, changes or fails to improve as anticipated.  He expressed understanding with the diagnosis(es) and plan. Patient understands that this encounter was a temporary measure, and the importance of further follow up and examination was emphasized.   Patient verbalized understanding.         Patient informed to follow up:    Follow-up and Dispositions    ??        Return in about 1 month (around 04/29/2019), or if symptoms worsen or fail to improve, for BPH.                  Electronically Signed: Gustavus Bryant, MD      Providers location when delivering service (clinic, hospital, home):  Home          ICD-10-CM  ICD-9-CM        1.  Benign prostatic hyperplasia with urinary frequency   N40.1  600.01  tamsulosin (FLOMAX) 0.4 mg capsule        R35.0  788.41                    Pursuant to the emergency declaration under the [place], 1135 waiver authority and the [employer] Act,  this Virtual  Visit was conducted, with patient's consent, to reduce the patient's risk of exposure to COVID-19 and provide continuity of care for an established patient.       Services were provided through a video synchronous discussion virtually to substitute for in-person clinic visit.     History         Patients past medical, surgical and family histories were reviewed.      Past Medical History:      Diagnosis  Date      ?  BPH (benign prostatic hyperplasia)        ?  CAD (coronary artery disease)          CABG x 5 2011- [place] basal inferior [place]      ?  History of vascular access device  09/08/2016        [facility] VAT, 5 FR double, R brachial, 48 cm with 1 cm out, LTA      ?  History of vascular access device  06/07/2018        11fbard power solo picc line 44cm at 2cm out, 38 circ, placed by E Franco, no difficulties. r brachial      ?  HLD (hyperlipidemia)        ?  HTN (hypertension)        ?  [place]      ?  OSA (obstructive sleep apnea)        ?  Polyneuropathy        ?  Tubular adenoma of colon          x2 on colonoscopy 09/18/15            Past Surgical History:      Procedure  Laterality  Date      ?  HX CORONARY ARTERY BYPASS GRAFT          ?  HX GASTRIC BYPASS    01/2018        gastric sleeve      ?  HX HIP REPLACEMENT  Bilateral          10 years ago      ?  HX ORTHOPAEDIC            right great and 2nd toe surgery            Family History      Problem  Relation  Age of Onset      ?  Stroke  Mother        ?  Other  Father              mrsa infxn  caused death      ?  Heart Attack  Brother              Social History            Socioeconomic History      ?  Marital status:  MARRIED          Spouse name:  Not on file      ?  Number of children:  Not on file      ?  Years of education:  Not on file      ?  Highest education level:  Not on file      Occupational History      ?  Not on file      Social Needs      ?  Financial resource strain:  Not on file      ?  Food insecurity          Worry:  Not on file          Inability:  Not on file      ?  Transportation needs          Medical:  Not on file          Non-medical:  Not on file      Tobacco Use      ?  Smoking status:  Former Smoker          Types:  Cigarettes      ?  Smokeless tobacco:  Never Used      Substance and Sexual Activity      ?  Alcohol use:  No          Comment: recently has stopped      ?  Drug use:  No      ?  Sexual activity:  Not Currently      Lifestyle      ?  Physical activity          Days per week:  Not on file          Minutes per session:  Not on file      ?  Stress:  Not on file  Relationships      ?  Social Garment/textile technologist on phone:  Not on file          Gets together:  Not on file          Attends religious service:  Not on file          Active member of club or organization:  Not on file          Attends meetings of clubs or organizations:  Not on file          Relationship status:  Not on file      ?  Intimate partner violence          Fear of current or ex partner:  Not on file          Emotionally abused:  Not on file          Physically abused:  Not on file          Forced sexual activity:  Not on file      Other Topics  Concern      ?  Military Service  Not Asked      ?  Blood Transfusions  Not Asked      ?  Caffeine Concern  Not Asked      ?  Occupational Exposure  Not Asked      ?  Hobby Hazards  Not Asked      ?  Sleep Concern  Not Asked      ?  Stress Concern  Not Asked      ?  Weight Concern  Not Asked      ?  Special Diet  Not Asked      ?  Back Care  Not  Asked      ?  Exercise  Not Asked      ?  Bike Helmet  Not Asked      ?  Seat Belt  Not Asked      ?  Self-Exams  Not Asked      Social History Narrative      ?  Not on file            Patient Active Problem List      Diagnosis  Code      ?  Essential hypertension, benign  I10      ?  HLD (hyperlipidemia)  E78.5      ?  Coronary atherosclerosis of native coronary artery  I25.10      ?  OSA on CPAP  G47.33, Z99.89      ?  Obesity  E66.9      ?  Tubular adenoma of colon  D12.6      ?  Polyneuropathy  G62.9      ?  Benign prostatic hyperplasia  N40.0      ?  Pure hypercholesterolemia  E78.00      ?  S/P CABG (coronary artery bypass graft)  Z95.1      ?  Chronic fatigue  R53.82      ?  Complications of gastric bypass surgery  K91.89, Y83.2      ?  Cellulitis  L03.90      ?  Right foot infection  L08.9      ?  Abscess of right foot  L02.611      ?  Acute osteomyelitis of left foot ([place])  M86.172  Current Medications/Allergies        Medications and Allergies reviewed:      Current Outpatient Medications      Medication  Sig  Dispense  Refill      ?  tamsulosin (FLOMAX) 0.4 mg capsule  Take 1 Cap by mouth daily. Take 30 minutes after same meal daily for enlarged prostate.  30 Cap  0      ?  Alpha Lipoic Acid 600 mg cap  Take 600 mg by mouth daily.          ?  pantoprazole (PROTONIX) 40 mg tablet  Take 1 Tab by mouth daily. Indications: gastroesophageal reflux disease  90 Tab  1      ?  lisinopriL (PRINIVIL, ZESTRIL) 10 mg tablet  TAKE 1 TABLET BY MOUTH EVERY DAY  90 Tab  1      ?  atorvastatin (LIPITOR) 40 mg tablet  Take 1 Tab by mouth daily. Indications: hardening of the arteries due to plaque buildup  90 Tab  1      ?  nebivoloL (Bystolic) 10 mg tablet  TAKE 1 TABLET BY MOUTH EVERY DAY (Patient taking differently: Take 5 mg by mouth daily. TAKE 1 TABLET BY MOUTH EVERY DAY)  90 Tab  2      ?  cyanocobalamin (VITAMIN B12) 500 mcg tablet  Take 1 Tab by mouth daily.  90 Tab  3      ?  b complex vitamins  tablet  Take 1 Tab by mouth daily.          ?  co-enzyme Q-10 (CO Q-10) 100 mg capsule  Take 100 mg by mouth daily.          ?  cpap machine kit  by Does Not Apply route.               Allergies      Allergen  Reactions      ?  Metoprolol  Other (comments)          Patient states, made me feel horrible.

## 2019-05-09 DIAGNOSIS — Z8739 Personal history of other diseases of the musculoskeletal system and connective tissue: Secondary | ICD-10-CM | POA: Insufficient documentation

## 2019-05-09 DIAGNOSIS — M14671 Charcot's joint, right ankle and foot: Secondary | ICD-10-CM | POA: Insufficient documentation

## 2019-05-09 DIAGNOSIS — M869 Osteomyelitis, unspecified: Secondary | ICD-10-CM | POA: Insufficient documentation

## 2019-05-09 NOTE — Telephone Encounter (Signed)
Patient states he is scheduled for foot surgery in 2 weeks and needs cardiac clearance. He would like to be squeezed in sooner than next available to obtain clearance from MD.     Phone: [phone]

## 2019-05-10 NOTE — Telephone Encounter (Signed)
Add on for next week

## 2019-05-11 NOTE — Telephone Encounter (Signed)
Attempted to reach patient by telephone. A message was left for return call.

## 2019-05-18 ENCOUNTER — Encounter: Attending: Specialist | Primary: Family Medicine

## 2019-06-25 ENCOUNTER — Inpatient Hospital Stay: Admit: 2019-06-25 | Payer: MEDICARE | Primary: Family Medicine

## 2019-06-25 ENCOUNTER — Encounter

## 2019-06-25 DIAGNOSIS — T847XXA Infection and inflammatory reaction due to other internal orthopedic prosthetic devices, implants and grafts, initial encounter: Secondary | ICD-10-CM

## 2019-06-25 LAB — CBC WITH AUTO DIFFERENTIAL
Basophils %: 2 % (ref 0.0–2.5)
Basophils Absolute: 0.1 10*3/uL (ref 0.0–0.2)
Eosinophils %: 4 % — ABNORMAL HIGH (ref 0.9–2.9)
Eosinophils Absolute: 0.2 10*3/uL (ref 0.0–0.7)
Hematocrit: 42.8 % (ref 41–53)
Hemoglobin: 14.1 g/dL (ref 13.5–17.5)
Lymphocytes %: 33 % (ref 20.5–51.1)
Lymphocytes Absolute: 1.2 10*3/uL (ref 1.0–4.8)
MCH: 29.2 PG — ABNORMAL LOW (ref 31–34)
MCHC: 32.8 g/dL (ref 31.0–36.0)
MCV: 89 FL (ref 80–100)
MPV: 10 FL (ref 6.5–11.5)
Monocytes %: 11 % — ABNORMAL HIGH (ref 1.7–9.3)
Monocytes Absolute: 0.4 10*3/uL (ref 0.2–2.4)
NRBC Absolute: 0.01 10*3/uL
Neutrophils %: 50 % (ref 42–75)
Neutrophils Absolute: 1.9 10*3/uL (ref 1.8–7.7)
Nucleated RBCs: 0.1 PER 100 WBC
Platelets: 153 10*3/uL
RBC: 4.81 M/uL (ref 4.50–5.90)
RDW: 20.2 % — ABNORMAL HIGH (ref 11.5–14.5)
WBC: 3.7 10*3/uL — ABNORMAL LOW (ref 4.4–11.3)

## 2019-06-25 LAB — BASIC METABOLIC PANEL
Anion Gap: 12 mmol/L (ref 5–15)
BUN: 20 mg/dL (ref 6–20)
Bun/Cre Ratio: 17 (ref 12–20)
CO2: 25 mmol/L (ref 21–32)
Calcium: 9 mg/dL (ref 8.5–10.1)
Chloride: 106 mmol/L (ref 97–108)
Creatinine: 1.18 mg/dL (ref 0.70–1.30)
EGFR IF NonAfrican American: >60 mL/min/{1.73_m2} (ref >60)
GFR African American: >60 mL/min/{1.73_m2} (ref >60)
Glucose: 96 mg/dL (ref 65–100)
Potassium: 4.4 mmol/L (ref 3.5–5.1)
Sodium: 143 mmol/L (ref 136–145)

## 2019-06-25 LAB — METABOLIC PANEL, BASIC
Anion gap: 12 mmol/L (ref 5–15)
BUN/Creatinine ratio: 17 (ref 12–20)
BUN: 20 mg/dL (ref 6–20)
CO2: 25 mmol/L (ref 21–32)
Calcium: 9 mg/dL (ref 8.5–10.1)
Chloride: 106 mmol/L (ref 97–108)
Creatinine: 1.18 mg/dL (ref 0.70–1.30)
GFR est AA: >60 mL/min/{1.73_m2} (ref >60)
GFR est non-AA: >60 mL/min/{1.73_m2} (ref >60)
Glucose: 96 mg/dL (ref 65–100)
Potassium: 4.4 mmol/L (ref 3.5–5.1)
Sodium: 143 mmol/L (ref 136–145)

## 2019-06-25 LAB — CBC WITH AUTOMATED DIFF
ABS. BASOPHILS: 0.1 10*3/uL (ref 0.0–0.2)
ABS. EOSINOPHILS: 0.2 10*3/uL (ref 0.0–0.7)
ABS. LYMPHOCYTES: 1.2 10*3/uL (ref 1.0–4.8)
ABS. MONOCYTES: 0.4 10*3/uL (ref 0.2–2.4)
ABS. NEUTROPHILS: 1.9 10*3/uL (ref 1.8–7.7)
ABSOLUTE NRBC: 0.01 10*3/uL
BASOPHILS: 2 % (ref 0.0–2.5)
EOSINOPHILS: 4 % — ABNORMAL HIGH (ref 0.9–2.9)
HCT: 42.8 % (ref 41–53)
HGB: 14.1 g/dL (ref 13.5–17.5)
LYMPHOCYTES: 33 % (ref 20.5–51.1)
MCH: 29.2 PG — ABNORMAL LOW (ref 31–34)
MCHC: 32.8 g/dL (ref 31.0–36.0)
MCV: 89 FL (ref 80–100)
MONOCYTES: 11 % — ABNORMAL HIGH (ref 1.7–9.3)
MPV: 10 FL (ref 6.5–11.5)
NEUTROPHILS: 50 % (ref 42–75)
NRBC: 0.1 PER 100 WBC
PLATELET: 153 10*3/uL
RBC: 4.81 M/uL (ref 4.50–5.90)
RDW: 20.2 % — ABNORMAL HIGH (ref 11.5–14.5)
WBC: 3.7 10*3/uL — ABNORMAL LOW (ref 4.4–11.3)

## 2019-06-26 LAB — C-REACTIVE PROTEIN: CRP: <0.29 mg/dL

## 2019-06-26 LAB — SEDIMENTATION RATE: Sed Rate: 15 mm/hr

## 2019-06-26 LAB — SED RATE (ESR): Sed rate, automated: 15 mm/hr

## 2019-06-26 LAB — C REACTIVE PROTEIN, QT: C-Reactive protein: <0.29 mg/dL

## 2019-07-18 ENCOUNTER — Telehealth: Attending: Specialist | Primary: Family Medicine

## 2019-07-18 ENCOUNTER — Telehealth: Admit: 2019-07-18 | Discharge: 2019-07-19 | Payer: MEDICARE | Attending: Specialist | Primary: Family Medicine

## 2019-07-18 DIAGNOSIS — G4733 Obstructive sleep apnea (adult) (pediatric): Secondary | ICD-10-CM

## 2019-07-18 NOTE — Telephone Encounter (Signed)
Spoke with Patient to schedule sleep study. He states he is currently in [place] and would like a call back in about 10 days to schedule once he is back in town

## 2019-07-18 NOTE — Patient Instructions (Signed)
Sleep Apnea: Care Instructions  Overview     Sleep apnea means that you frequently stop breathing for 10 seconds or longer during sleep. It can be mild to severe, based on the number of times an hour that you stop breathing or have slowed breathing.  Blocked or narrowed airways in your nose, mouth, or throat can cause sleep apnea. Your airway can become blocked when your throat muscles and tongue relax during sleep.  You can help treat sleep apnea at home by making lifestyle changes. You also can use a CPAP breathing machine that keeps tissues in the throat from blocking your airway. Or your doctor may suggest that you use a breathing device while you sleep. It helps keep your airway open. This could be a device that you put in your mouth. In some cases, surgery may be needed to remove enlarged tissues in the throat.  Follow-up care is a key part of your treatment and safety. Be sure to make and go to all appointments, and call your doctor if you are having problems. It's also a good idea to know your test results and keep a list of the medicines you take.  How can you care for yourself at home?  ?? Lose weight, if needed.  ?? Sleep on your side. It may help mild apnea.  ?? Avoid alcohol and medicines such as sleeping pills, opioids, or sedatives before bed.  ?? Don't smoke. If you need help quitting, talk to your doctor.  ?? Prop up the head of your bed.  ?? Treat breathing problems, such as a stuffy nose, that are caused by a cold or allergies.  ?? Try a continuous positive airway pressure (CPAP) breathing machine if your doctor recommends it.  ?? If CPAP doesn't work for you, ask your doctor if you can try other masks, settings, or breathing machines.  ?? Try oral breathing devices or other nasal devices.  ?? Talk to your doctor if your nose feels dry or bleeds, or if it gets runny or stuffy when you use a breathing machine.  ?? Tell your doctor if you're sleepy during the day and it affects your daily life. Don't  drive or operate machinery when you're drowsy.  When should you call for help?  Watch closely for changes in your health, and be sure to contact your doctor if:  ?? ?? You still have sleep apnea even though you have made lifestyle changes.   ?? ?? You are thinking of trying a device such as CPAP.   ?? ?? You are having problems using a CPAP or similar machine.   ?? ?? You are still sleepy during the day, and it affects your daily life.   Where can you learn more?  Go to https://www.healthwise.net/[facility]Help[facility]ctions  Enter J936 in the search box to learn more about "Sleep Apnea: Care Instructions."  Current as of: January 22, 2019??????????????????????????????Content Version: 12.8  ?? 2006-2021 Healthwise, Incorporated.   Care instructions adapted under license by [employer] (which disclaims liability or warranty for this information). If you have questions about a medical condition or this instruction, always ask your healthcare professional. Healthwise, Incorporated disclaims any warranty or liability for your use of this information.

## 2019-07-18 NOTE — Progress Notes (Signed)
[address] [name] [address]  Tel.  [phone]  Fax. [place] [address]  Tel.  [phone]  Fax. [phone] [place] [address]  Tel.  [phone]  Fax. [phone]     Elijah [name] is a 69 y.o. male who was seen by synchronous (real-time) audio-video technology on 07/18/2019.      Consent:  He and/or his healthcare decision maker is aware that this patient-initiated Telehealth encounter is a billable service, with coverage as determined by his insurance carrier. He is aware that he may receive a bill and has provided verbal consent to proceed: Yes    I was in the office while conducting this encounter.    Chief Complaint       No chief complaint on file.      HPI      Elijah [name] is 70 y.o. male seen for evaluation of a sleep disorder.  Records were obtained from the [place].  They refer to his having had an initial evaluation in the [facility].  That study demonstrated severe sleep disordered breathing characterized by an overall AHI of 61.1/h.  He was started on CPAP 15-18 cm.    In the interim he underwent gastric bypass surgery and lost over 100 pounds.    Currently retires 11 PM and awakens at 8 AM.  He may awaken several times during the night.  He denies snoring, apparent apnea, vivid dreaming or nightmares, sleep talking or sleepwalking, bruxism or nocturnal incontinence, abnormal arm or leg movements, hypnagogic hallucinations, sleep paralysis or cataplexy.    He denies significant daytime fatigue.      Allergies   Allergen Reactions   ??? Metoprolol Other (comments)     Patient states, made me feel horrible.       Current Outpatient Medications   Medication Sig Dispense Refill   ??? tamsulosin (FLOMAX) 0.4 mg capsule Take 1 Cap by mouth daily. Take 30 minutes after same meal daily for enlarged prostate. 30 Cap 0   ??? Alpha Lipoic Acid 600 mg cap Take 600 mg by mouth daily.     ???  pantoprazole (PROTONIX) 40 mg tablet Take 1 Tab by mouth daily. Indications: gastroesophageal reflux disease 90 Tab 1   ??? lisinopriL (PRINIVIL, ZESTRIL) 10 mg tablet TAKE 1 TABLET BY MOUTH EVERY DAY 90 Tab 1   ??? atorvastatin (LIPITOR) 40 mg tablet Take 1 Tab by mouth daily. Indications: hardening of the arteries due to plaque buildup 90 Tab 1   ??? nebivoloL (Bystolic) 10 mg tablet TAKE 1 TABLET BY MOUTH EVERY DAY (Patient taking differently: Take 5 mg by mouth daily. TAKE 1 TABLET BY MOUTH EVERY DAY) 90 Tab 2   ??? cyanocobalamin (VITAMIN B12) 500 mcg tablet Take 1 Tab by mouth daily. 90 Tab 3   ??? b complex vitamins tablet Take 1 Tab by mouth daily.     ??? co-enzyme Q-10 (CO Q-10) 100 mg capsule Take 100 mg by mouth daily.     ??? cpap machine kit by Does Not Apply route.          He  has a past medical history of BPH (benign prostatic hyperplasia), CAD (coronary artery disease), History of vascular access device (09/08/2016), History of vascular access device (06/07/2018), HLD (hyperlipidemia), HTN (hypertension), Morbid obesity due to excess calories ([place]) (04/24/2015), OSA (obstructive sleep apnea), Polyneuropathy, and Tubular adenoma of colon.  He  has a past surgical history that includes hx hip replacement (Bilateral); hx gastric bypass (01/2018); hx coronary artery bypass graft; and hx orthopaedic.    He family history includes Heart Attack in his brother; Other in his father; Stroke in his mother.    He  reports that he has quit smoking. His smoking use included cigarettes. He has never used smokeless tobacco. He reports that he does not drink alcohol or use drugs.     Review of Systems:  Review of Systems   Constitutional: Negative for chills and fever.   HENT: Negative for hearing loss and tinnitus.    Eyes: Negative for blurred vision and double vision.   Respiratory: Negative for cough and shortness of breath.    Cardiovascular: Negative for chest pain and palpitations.   Gastrointestinal: Negative for  abdominal pain and heartburn.   Genitourinary: Negative for frequency and urgency.   Musculoskeletal: Negative for back pain and neck pain.   Skin: Negative for itching and rash.   Neurological: Negative for dizziness and headaches.   Psychiatric/Behavioral: Negative for depression. The patient is not nervous/anxious.        Due to this being a telemedicine evaluation, certain elements of the physical examination are unable to be assessed.  Objective:       General:   Conversant, cooperative   Eyes:  no nystagmus   Oropharynx:  tongue normal                   Skin:   no obvious rashes   Neuro:  Speech fluent, face symmetrical, tongue movement normal   Psych:  Normal affect,  normal countenance        Assessment:       ICD-10-CM ICD-9-CM    1. OSA (obstructive sleep apnea)  G47.33 327.23 POLYSOMNOGRAPHY 1 NIGHT       History of significant sleep disordered breathing; status post weight loss.  He will be reevaluated with a PSG.  Results to be reviewed with him.    Plan:     Orders Placed This Encounter   ??? POLYSOMNOGRAPHY 1 NIGHT     Standing Status:   Future     Standing Expiration Date:   01/18/2020     Order Specific Question:   Reason for Exam     Answer:   history of sleep apnea; s/p/ significant weight loss       * Patient has a history and examination consistent with the diagnosis of sleep apnea.  * Sleep testing was ordered for reevaluation.    * He was provided information on sleep apnea including corresponding risk factors and the importance of proper treatment.   * Treatment options if indicated were reviewed today.      Instructions:    o Do not engage in activities requiring a normal degree of alertness if fatigue is present.  o The patient understands that untreated or undertreated sleep apnea could impair judgement and the ability to function normally during the day.  o Call or return if symptoms worsen or persist.          Lenna Gilford, MD, FAASM  Electronically signed 07/18/19     Pursuant to  the emergency declaration under the [place], 1135 waiver authority and the [employer] Act, this Virtual  Visit was conducted, with patient's consent, to reduce the patient's risk of exposure to COVID-19 and provide continuity of care  for an established patient.     Services were provided through a video synchronous discussion virtually to substitute for in-person clinic visit.    Davy Pique, MD     Greater than 25 minutes spent: In direct video patient care, chart review and planning.     This note was created using voice recognition software. Despite editing, there may be syntax errors.  This note will not be viewable in [place].

## 2019-07-18 NOTE — Progress Notes (Signed)
Progress Notes by Davy Pique, MD at 07/18/19 1600                Author: Davy Pique, MD  Service: --  Author Type: Physician       Filed: 07/18/19 1742  Encounter Date: 07/18/2019  Status: Signed          Editor: Davy Pique, MD (Physician)                                             [address] [place] [address]   Tel.  [phone]   [place] [address]   Tel.  [phone]   Fax. [phone]  [place] [address]   Tel.  [phone]   Fax. [phone]        Elijah [name] is a 69 y.o.  male who was seen by synchronous (real-time) audio-video technology on 07/18/2019.        Consent:   He and/or his healthcare decision  maker is aware that this patient-initiated Telehealth encounter is a billable service, with coverage as determined by his insurance carrier.  He is aware that he may receive a bill and has provided verbal consent to proceed:  Yes      I was in the office while conducting this encounter.        Chief Complaint          No chief complaint on file.           HPI         Elijah [name] is 69 y.o.  male seen for evaluation of a sleep disorder.  Records were obtained from the [place].  They  refer to his having had an initial evaluation in the [facility] health system.  That study demonstrated severe sleep disordered breathing characterized by an overall AHI of 61.1/h.  He was started on CPAP 15-18 cm.      In the interim he underwent gastric bypass surgery and lost over 100 pounds.      Currently retires 11 PM and awakens at 8 AM.  He may awaken several times during the night.  He denies snoring, apparent apnea, vivid dreaming or nightmares, sleep talking or sleepwalking, bruxism or nocturnal incontinence, abnormal arm or leg movements,  hypnagogic hallucinations, sleep paralysis or cataplexy.      He denies significant daytime fatigue.           Allergies        Allergen  Reactions          ?  Metoprolol  Other (comments)             Patient states, made me feel horrible.             Current Outpatient Medications          Medication  Sig  Dispense  Refill           ?  tamsulosin (FLOMAX) 0.4 mg capsule  Take 1 Cap by mouth daily. Take 30 minutes after same meal daily for enlarged prostate.  30 Cap  0           ?  Alpha Lipoic Acid 600 mg cap  Take 600 mg by mouth daily.         ?  pantoprazole (PROTONIX) 40 mg tablet  Take 1 Tab by mouth daily. Indications: gastroesophageal reflux disease  90 Tab  1     ?  lisinopriL (PRINIVIL, ZESTRIL) 10 mg tablet  TAKE 1 TABLET BY MOUTH EVERY DAY  90 Tab  1     ?  atorvastatin (LIPITOR) 40 mg tablet  Take 1 Tab by mouth daily. Indications: hardening of the arteries due to plaque buildup  90 Tab  1     ?  nebivoloL (Bystolic) 10 mg tablet  TAKE 1 TABLET BY MOUTH EVERY DAY (Patient taking differently: Take 5 mg by mouth daily. TAKE 1 TABLET BY MOUTH EVERY DAY)  90 Tab  2     ?  cyanocobalamin (VITAMIN B12) 500 mcg tablet  Take 1 Tab by mouth daily.  90 Tab  3     ?  b complex vitamins tablet  Take 1 Tab by mouth daily.         ?  co-enzyme Q-10 (CO Q-10) 100 mg capsule  Take 100 mg by mouth daily.               ?  cpap machine kit  by Does Not Apply route.                He  has a past medical history of BPH (benign prostatic hyperplasia), CAD (coronary  artery disease), History of vascular access device (09/08/2016), History of vascular access device (06/07/2018), HLD (hyperlipidemia), HTN (hypertension), Morbid obesity due to excess calories ([place]) (04/24/2015), OSA (obstructive sleep apnea), Polyneuropathy,  and Tubular adenoma of colon.      He  has a past surgical history that includes hx hip replacement (Bilateral); hx gastric  bypass (01/2018); hx coronary artery bypass graft; and hx orthopaedic.      He family history includes Heart Attack in his brother; Other in his father; Stroke  in his mother.      He  reports that he has quit smoking. His smoking  use included cigarettes. He has never  used smokeless tobacco. He reports that he does not drink alcohol or use drugs.       Review of Systems:   Review of Systems    Constitutional: Negative for chills and fever.    HENT: Negative for hearing loss and tinnitus.     Eyes: Negative for blurred vision and double vision.    Respiratory: Negative for cough and shortness of breath.     Cardiovascular: Negative for chest pain and palpitations.    Gastrointestinal: Negative for abdominal pain and heartburn.    Genitourinary: Negative for frequency and urgency.    Musculoskeletal: Negative for back pain and neck pain.    Skin: Negative for itching and rash.    Neurological: Negative for dizziness and headaches.    Psychiatric/Behavioral: Negative for depression. The patient is not nervous/anxious.           Due to this being a telemedicine evaluation, certain elements of the physical examination are unable to be assessed.     Objective:              General:    Conversant, cooperative     Eyes:   no nystagmus     Oropharynx:   tongue normal  Skin:    no obvious rashes     Neuro:   Speech fluent, face symmetrical, tongue movement normal        Psych:   Normal affect,  normal countenance              Assessment:                  ICD-10-CM  ICD-9-CM             1.  OSA (obstructive sleep apnea)   G47.33  327.23  POLYSOMNOGRAPHY 1 NIGHT           History of significant sleep disordered breathing; status post weight loss.  He will be reevaluated with a PSG.  Results to be reviewed with him.        Plan:          Orders Placed This Encounter        ?  POLYSOMNOGRAPHY 1 NIGHT              Standing Status:    Future         Standing Expiration Date:    01/18/2020         Order Specific Question:    Reason for Exam              Answer:    history of sleep apnea; s/p/ significant weight loss           * Patient has a history and examination consistent with the diagnosis of sleep apnea.   * Sleep  testing was ordered for reevaluation.     * He was provided information on sleep apnea including corresponding risk factors and the importance of proper treatment.    * Treatment options if indicated were reviewed today.        Instructions:      o  Do not engage in activities requiring a normal degree of alertness if fatigue is present.   o  The patient understands that untreated or undertreated sleep apnea could impair judgement and the ability to function normally during the day.   o  Call or return if symptoms worsen or persist.               Lenna Gilford, MD, FAASM   Electronically signed 07/18/19       Pursuant to the emergency declaration under the [place], 1135 waiver authority and the [employer] Act,  this Virtual  Visit was conducted, with patient's consent, to reduce the patient's risk of exposure to COVID-19 and provide continuity of care for an established patient.       Services were provided through a video synchronous discussion virtually to substitute for in-person clinic visit.      Davy Pique, MD       Greater than 25 minutes spent: In direct video patient care, chart review and planning.       This note was created using voice recognition software. Despite editing, there may be syntax errors.  This note will not be viewable  in [place].

## 2019-08-01 NOTE — Telephone Encounter (Signed)
Spoke with patient who states he is still in [place] and does not know when he will be back. Wants our office to give him a call back in a couple of weeks to schedule PSG.

## 2019-08-31 NOTE — Progress Notes (Signed)
Sp[facility] to [name] [facility] to request extension on prior authorization/referral for sleep.  She has been informed of initial consult on 07/18/2019 and upcoming sleep study on 09/11/2019.  She will forward to ordering provider and fax the updated referral once completed.

## 2019-08-31 NOTE — Progress Notes (Signed)
Spoke with Claris Gower at [facility] to follow-up on referral for PSG scheduled for 09/11/2019.  Current PA on file expired 09/02/2019 and will need it extended to cover the service tomorrow.

## 2019-08-31 NOTE — Progress Notes (Signed)
Spoke to Donovan at the [facility] Department to request extension on prior authorization/referral for sleep.  She has been informed of initial consult on 07/18/2019 and upcoming sleep study on 09/11/2019.  She will forward to ordering provider and fax the updated referral once completed.

## 2019-09-05 ENCOUNTER — Ambulatory Visit: Primary: Family Medicine

## 2019-09-06 ENCOUNTER — Encounter: Payer: Self-pay | Admitting: *Deleted

## 2019-09-06 ENCOUNTER — Encounter: Payer: Self-pay | Admitting: Cardiology

## 2019-09-06 ENCOUNTER — Other Ambulatory Visit: Payer: Self-pay

## 2019-09-06 ENCOUNTER — Ambulatory Visit: Payer: Medicare PPO | Admitting: Cardiology

## 2019-09-06 VITALS — BP 120/64 | HR 64 | Ht 74.0 in | Wt 227.8 lb

## 2019-09-06 DIAGNOSIS — Z79899 Other long term (current) drug therapy: Secondary | ICD-10-CM

## 2019-09-06 DIAGNOSIS — Z951 Presence of aortocoronary bypass graft: Secondary | ICD-10-CM

## 2019-09-06 DIAGNOSIS — I10 Essential (primary) hypertension: Secondary | ICD-10-CM | POA: Diagnosis not present

## 2019-09-06 DIAGNOSIS — M19071 Primary osteoarthritis, right ankle and foot: Secondary | ICD-10-CM | POA: Insufficient documentation

## 2019-09-06 DIAGNOSIS — I2510 Atherosclerotic heart disease of native coronary artery without angina pectoris: Secondary | ICD-10-CM

## 2019-09-06 DIAGNOSIS — M216X1 Other acquired deformities of right foot: Secondary | ICD-10-CM | POA: Insufficient documentation

## 2019-09-06 NOTE — Progress Notes (Signed)
Cardiology Office Note:    Date:  09/06/2019   ID:  Aaron [name], DOB 1950/11/09, MRN [id number]  PCP:  Patient, No Pcp Per  [place] Cardiologist:  No primary care provider on file.  [facility] Electrophysiologist:  None   Referring MD: No ref. provider found    History of Present Illness:    Aaron [name] is a 69 y.o. male here for the follow-up of coronary artery disease status post CABG in 2009.  Had some post surgery fatigue with beta-blocker and was changed to Bystolic.  Stress test in January 2018 shows a fixed basal inferior defect with EF of 53%.  He lost 3 family members including his son at a young age.  Had gastric bypass and 100 pound weight loss.    His brother died of a heart attack.  He had osteomyelitis of his right foot.  BP improved after weight loss.  Was taking half of the Bystolic.  No chest pain no shortness of breath.  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 Enjoyed scuba diving kayak, [place].  He moved to [place] previously.  Went to weight loss program in [place] at 1 point.  He used to see [name] as primary. [place], [place].-New home  He has recurrent osteomyelitis, has had 10 surgeries on his feet in the past 3 years.  Denies any chest pain shortness of breath.  Past Medical History:  Diagnosis Date   Coronary artery disease    GERD with apnea    Hyperlipidemia    Hypertension    Hypertrophy of prostate    Obesity    OSA on CPAP     Past Surgical History:  Procedure Laterality Date   CORONARY ARTERY BYPASS GRAFT      Current Medications: Current Meds  Medication Sig   amLODipine (NORVASC) 5 MG tablet Take 1 tablet by mouth daily.   aspirin 325 MG tablet Take 325 mg by mouth daily.   atorvastatin (LIPITOR) 40 MG tablet Take 40 mg by mouth daily.   b complex vitamins tablet Take 1 tablet by mouth daily.   chlorthalidone (HYGROTON) 25 MG tablet Take 1 tablet by mouth daily.   Coenzyme Q10 (COQ-10 PO) Take 1  tablet by mouth daily.   lisinopril (PRINIVIL,ZESTRIL) 20 MG tablet Take 1 tablet by mouth daily.   nebivolol (BYSTOLIC) 10 MG tablet Take 1 tablet (10 mg total) by mouth daily.   potassium chloride (K-DUR) 10 MEQ tablet Take 1 tablet by mouth daily.   vitamin B-12 (CYANOCOBALAMIN) 500 MCG tablet Take 500 mcg by mouth daily.     Allergies:   Patient has no known allergies.   Social History   Socioeconomic History   Marital status: Married    Spouse name: Not on file   Number of children: Not on file   Years of education: Not on file   Highest education level: Not on file  Occupational History   Not on file  Tobacco Use   Smoking status: Former Smoker   Smokeless tobacco: Never Used  Substance and Sexual Activity   Alcohol use: No   Drug use: No   Sexual activity: Not on file  Other Topics Concern   Not on file  Social History Narrative   Not on file   Social Determinants of Health   Financial Resource Strain:    Difficulty of Paying Living Expenses:   Food Insecurity:    Worried About [place] in the Last Year:  Ran Out of Food in the Last Year:   Transportation Needs:    Film/video editor (Medical):    Lack of Transportation (Non-Medical):   Physical Activity:    Days of Exercise per Week:    Minutes of Exercise per Session:   Stress:    Feeling of Stress :   Social Connections:    Frequency of Communication with Friends and Family:    Frequency of Social Gatherings with Friends and Family:    Attends Religious Services:    Active Member of Clubs or Organizations:    Attends Archivist Meetings:    Marital Status:      Family History: The patient's family history includes Hypertension in his father.  ROS:   Please see the history of present illness.     All other systems reviewed and are negative.  EKGs/Labs/Other Studies Reviewed:    The following studies were reviewed today:   NUC 04-07-16  Lexiscan SPECT images demonstrate a medium, fixed abnormality of moderate degree in the basal inferior region on the stress and rest images. Gated SPECT images reveals normal myocardial thickening and wall motion. The left ventricular ejection fraction was calculated to be 53 %.    EKG:  EKG is  ordered today.  The ekg ordered today demonstrates SR 64  Recent Labs: No results found for requested labs within last 8760 hours.  Recent Lipid Panel No results found for: CHOL, TRIG, HDL, CHOLHDL, VLDL, LDLCALC, LDLDIRECT  Physical Exam:    VS:  BP 120/64    Pulse 64    Ht 6\' 2"  (1.88 m)    Wt 227 lb 12.8 oz (103.3 kg)    SpO2 96%    BMI 29.25 kg/m     Wt Readings from Last 3 Encounters:  09/06/19 227 lb 12.8 oz (103.3 kg)  06/25/14 (!) 329 lb (149.2 kg)  06/10/14 (!) 326 lb (147.9 kg)     GEN:  Well nourished, well developed in no acute distress HEENT: Normal NECK: No JVD; No carotid bruits LYMPHATICS: No lymphadenopathy CARDIAC: RRR, no murmurs, rubs, gallops RESPIRATORY:  Clear to auscultation without rales, wheezing or rhonchi  ABDOMEN: Soft, non-tender, non-distended MUSCULOSKELETAL:  No edema; No deformity  SKIN: Warm and dry NEUROLOGIC:  Alert and oriented x 3 PSYCHIATRIC:  Normal affect   ASSESSMENT:    1. Atherosclerosis of native coronary artery of native heart, angina presence unspecified   2. S/P CABG (coronary artery bypass graft)   3. Essential hypertension   4. Medication management    PLAN:    In order of problems listed above:  Coronary artery disease post CABG -CABG in 2011.  Stress test previously reviewed.  Had surgery on metatarsal joint on foot secondary to osteomyelitis.  Was felt to be low risk for the surgery.  Most recently seen by cardiologist Dr. Daniel Nones, [place] in [place]. -- Brother died 73 years, son took his life 3 weeks later   --Lexiscan--NUC stress - CABG. If low risk - should be low risk for surgery.   Essential hypertension -Blood  pressure has improved no longer on amlodipine taking half of the Bystolic since his 119 pound weight loss after bariatric surgery, sleeve.  Hyperlipidemia -Lipids continue high potency.  Secondary prevention.  Last LDL 61  Obstructive sleep apnea -On CPAP.  Osteomyelisits --[facility] osteo.   Medication Adjustments/Labs and Tests Ordered: Current medicines are reviewed at length with the patient today.  Concerns regarding medicines are outlined above.  Orders  Placed This Encounter  Procedures   ALT   CBC   Basic metabolic panel   Lipid panel   MYOCARDIAL PERFUSION IMAGING   EKG 12-Lead   No orders of the defined types were placed in this encounter.   Patient Instructions  Medication Instructions:  The current medical regimen is effective;  continue present plan and medications.  *If you need a refill on your cardiac medications before your next appointment, please call your pharmacy*   Lab Work: Please have blood work today (CBC, BMP, Lipid and ALT)  If you have labs (blood work) drawn today and your tests are completely normal, you will receive your results only by:  MyChart Message (if you have MyChart) OR  A paper copy in the mail If you have any lab test that is abnormal or we need to change your treatment, we will call you to review the results.   Testing/Procedures: Your physician has requested that you have a lexiscan myoview. For further information please visit [email or web address]. Please follow instruction sheet, as given.  Follow-Up: At [facility], you and your health needs are our priority.  As part of our continuing mission to provide you with exceptional heart care, we have created designated Provider Care Teams.  These Care Teams include your primary Cardiologist (physician) and Advanced Practice Providers (APPs -  Physician Assistants and Nurse Practitioners) who all work together to provide you with the care you need, when you need it.  We  recommend signing up for the patient portal called "MyChart".  Sign up information is provided on this After Visit Summary.  MyChart is used to connect with patients for Virtual Visits (Telemedicine).  Patients are able to view lab/test results, encounter notes, upcoming appointments, etc.  Non-urgent messages can be sent to your provider as well.   To learn more about what you can do with MyChart, go to [email or web address].    Your next appointment:   6 month(s)  The format for your next appointment:   In Person  Provider:   Candee Furbish, MD   Thank you for choosing [facility]!!         Signed, Candee Furbish, MD  09/06/2019 5:26 PM    [place]

## 2019-09-06 NOTE — Patient Instructions (Signed)
Medication Instructions:  The current medical regimen is effective;  continue present plan and medications.  *If you need a refill on your cardiac medications before your next appointment, please call your pharmacy*   Lab Work: Please have blood work today (CBC, BMP, Lipid and ALT)  If you have labs (blood work) drawn today and your tests are completely normal, you will receive your results only by: Marland Kitchen MyChart Message (if you have MyChart) OR . A paper copy in the mail If you have any lab test that is abnormal or we need to change your treatment, we will call you to review the results.   Testing/Procedures: Your physician has requested that you have a lexiscan myoview. For further information please visit [email or web address]. Please follow instruction sheet, as given.  Follow-Up: At [facility], you and your health needs are our priority.  As part of our continuing mission to provide you with exceptional heart care, we have created designated Provider Care Teams.  These Care Teams include your primary Cardiologist (physician) and Advanced Practice Providers (APPs -  Physician Assistants and Nurse Practitioners) who all work together to provide you with the care you need, when you need it.  We recommend signing up for the patient portal called "MyChart".  Sign up information is provided on this After Visit Summary.  MyChart is used to connect with patients for Virtual Visits (Telemedicine).  Patients are able to view lab/test results, encounter notes, upcoming appointments, etc.  Non-urgent messages can be sent to your provider as well.   To learn more about what you can do with MyChart, go to [email or web address].    Your next appointment:   6 month(s)  The format for your next appointment:   In Person  Provider:   Candee Furbish, MD   Thank you for choosing [facility]!!

## 2019-09-07 ENCOUNTER — Ambulatory Visit (HOSPITAL_COMMUNITY): Payer: Medicare PPO | Attending: Internal Medicine

## 2019-09-07 ENCOUNTER — Telehealth: Payer: Self-pay

## 2019-09-07 DIAGNOSIS — Z951 Presence of aortocoronary bypass graft: Secondary | ICD-10-CM | POA: Insufficient documentation

## 2019-09-07 DIAGNOSIS — I2510 Atherosclerotic heart disease of native coronary artery without angina pectoris: Secondary | ICD-10-CM | POA: Insufficient documentation

## 2019-09-07 DIAGNOSIS — I10 Essential (primary) hypertension: Secondary | ICD-10-CM

## 2019-09-07 LAB — MYOCARDIAL PERFUSION IMAGING
LV dias vol: 132 mL (ref 62–150)
LV sys vol: 55 mL
Peak HR: 78 {beats}/min
Rest HR: 60 {beats}/min
SDS: 2
SRS: 1
SSS: 3
TID: 0.98

## 2019-09-07 LAB — CBC
Hematocrit: 41.1 % (ref 37.5–51.0)
Hemoglobin: 14 g/dL (ref 13.0–17.7)
MCH: 30.9 pg (ref 26.6–33.0)
MCHC: 34.1 g/dL (ref 31.5–35.7)
MCV: 91 fL (ref 79–97)
Platelets: 206 10*3/uL (ref 150–450)
RBC: 4.53 x10E6/uL (ref 4.14–5.80)
RDW: 15.1 % (ref 11.6–15.4)
WBC: 6 10*3/uL (ref 3.4–10.8)

## 2019-09-07 LAB — BASIC METABOLIC PANEL
BUN/Creatinine Ratio: 20 (ref 10–24)
BUN: 22 mg/dL (ref 8–27)
CO2: 25 mmol/L (ref 20–29)
Calcium: 9.6 mg/dL (ref 8.6–10.2)
Chloride: 106 mmol/L (ref 96–106)
Creatinine, Ser: 1.1 mg/dL (ref 0.76–1.27)
GFR calc Af Amer: 78 mL/min/{1.73_m2} (ref >59)
GFR calc non Af Amer: 68 mL/min/{1.73_m2} (ref >59)
Glucose: 89 mg/dL (ref 65–99)
Potassium: 4.3 mmol/L (ref 3.5–5.2)
Sodium: 142 mmol/L (ref 134–144)

## 2019-09-07 LAB — LIPID PANEL
Chol/HDL Ratio: 2.3 ratio (ref 0.0–5.0)
Cholesterol, Total: 163 mg/dL (ref 100–199)
HDL: 71 mg/dL (ref >39)
LDL Chol Calc (NIH): 71 mg/dL (ref 0–99)
Triglycerides: 119 mg/dL (ref 0–149)
VLDL Cholesterol Cal: 21 mg/dL (ref 5–40)

## 2019-09-07 LAB — ALT: ALT: 38 IU/L (ref 0–44)

## 2019-09-07 MED ORDER — TECHNETIUM TC 99M TETROFOSMIN IV KIT
10.8000 | PACK | Freq: Once | INTRAVENOUS | Status: AC | PRN
  Administered 2019-09-07: 10.8 via INTRAVENOUS
  Filled 2019-09-07: qty 11

## 2019-09-07 MED ORDER — REGADENOSON 0.4 MG/5ML IV SOLN
0.4000 mg | Freq: Once | INTRAVENOUS | Status: AC
  Administered 2019-09-07: 0.4 mg via INTRAVENOUS

## 2019-09-07 MED ORDER — TECHNETIUM TC 99M TETROFOSMIN IV KIT
30.7000 | PACK | Freq: Once | INTRAVENOUS | Status: AC | PRN
  Administered 2019-09-07: 30.7 via INTRAVENOUS
  Filled 2019-09-07: qty 31

## 2019-09-07 MED ORDER — ATORVASTATIN CALCIUM 80 MG PO TABS: 80.0000 mg | ORAL_TABLET | Freq: Every day | ORAL | 3 refills | Status: DC

## 2019-09-07 NOTE — Telephone Encounter (Signed)
-----   Message from Jerline Pain, MD sent at 09/07/2019  4:47 PM EDT ----- LDL 71. Almost 70.  Let's increase atorvastatin to 80mg  PO QD Repeat lipids at next visit.  Candee Furbish, MD

## 2019-09-07 NOTE — Telephone Encounter (Signed)
The patient has been notified of the result and verbalized understanding.  All questions (if any) were answered. Antonieta Iba, RN 09/07/2019 4:51 PM  Patient will increase atorvastatin to 80 mg daily.

## 2019-09-11 ENCOUNTER — Inpatient Hospital Stay: Admit: 2019-09-11 | Discharge: 2019-09-12 | Primary: Family Medicine

## 2019-09-11 ENCOUNTER — Telehealth: Payer: Self-pay | Admitting: Cardiology

## 2019-09-11 ENCOUNTER — Encounter: Payer: Self-pay | Admitting: *Deleted

## 2019-09-11 DIAGNOSIS — G4733 Obstructive sleep apnea (adult) (pediatric): Secondary | ICD-10-CM

## 2019-09-11 NOTE — Telephone Encounter (Signed)
Letter completed and faxed to Dr Selinda Flavin.

## 2019-09-11 NOTE — Telephone Encounter (Signed)
Follow Up:   Pt is calling to see if his Stress Test results from 09-07-19 are ready  Please?

## 2019-09-11 NOTE — Telephone Encounter (Signed)
Pt needs surgical clearance letter.  Will ask Dr Marlou Porch if OK to send and what he would like for it to say. Surgery is scheduled for 09/27/2019 with Dr Selinda Flavin at [facility] # [phone] [address] [place], [place] [address]

## 2019-09-11 NOTE — Telephone Encounter (Signed)
Dr. Donnie Mesa,  Mr. Govoni may proceed with surgery with low to moderate overall cardiac risk given his prior history of bypass surgery.  Recent nuclear stress test was overall low risk with no areas of ischemia identified.  His ejection fraction was normal at 58%.  Please proceed.  Please let me know if I can be of further assistance,  Candee Furbish, MD

## 2019-09-11 NOTE — Telephone Encounter (Signed)
Low risk stress test. No ischemia. Normal EF 58%.  OK to proceed with lower extremity surgery.   Candee Furbish, MD

## 2019-09-11 NOTE — Telephone Encounter (Signed)
 Pt called and wanted to go to have surgery on his foot. It would put him out of work for 2-3 months. Not putting pressure on his foot. Pt does not want to come in here to see Dr. Franco or any doctor just so he can go to another doctor to get his surgery done. He also refused to sign a release of record form to get his records sent to another facility to have it done.I reminded him that he had not been here in a while. 03/29/2019  But it did not matter.  Pt just became anxious and said he was not going to do any of these hoop games and ended the conversation.

## 2019-09-11 NOTE — Telephone Encounter (Signed)
-----   Message from Marcy Salvo sent at 09/11/2019 11:50 AM EDT -----  Regarding: [place]/telephone  Pt is requesting to know if you could schedule him a routine colonoscope.Pts number is [phone].

## 2019-09-11 NOTE — Telephone Encounter (Signed)
Pt called back requesting his Colonoscopy records of 4 to 5 years ago. I told Pt that he would have to request the records from the Doctor who did his Colonoscopy. That we have a company named [employer] who does our copying of records. He said that was ridicules and asked to speak to Print production planner. I told him I could send him to voicemail and she would call him back promptly but that she was at another Office at this time. He refused after requesting it. He asked for the Request Medical Records form. He asked me to emailed it to him. Which I did. He called me back saying that he emailed it back to [facility] email. I let him know that he would have to mail it, bring it or fax it back to me. He said that was just stupid. He will  Come by here in the morning.

## 2019-09-11 NOTE — Telephone Encounter (Signed)
Reviewed results of Lexiscan with pt who states understanding.  All questions, if any were answered at the time of the call.

## 2019-09-12 NOTE — Progress Notes (Signed)
 Progress  Notes by Lafe Ryan BRAVO at 09/12/19 0502                Author: Lafe Ryan BRAVO  Service: --  Author Type: Technologist       Filed: 09/12/19 0646  Encounter Date: 09/12/2019  Status: Signed          Editor: Lafe Ryan BRAVO (Technologist)                                         [address] [place], [place] [address]   Tel.  [phone]   Fax. [phone]  [address]   [place], [place] [address]   Tel.  [phone]   Fax. [phone]  [address]   [place], [place] [address]   Tel.  [phone]   Fax. [phone]        Sleep Study Technical Notes           PRE-Test:     Elijah [name] (DOB: 1951/01/19 ) arrived in the lobby. Patient was greeted, temperature checked (98.5 ) and screening questions asked. The patient was taken to the Sleep  Center and taken directly to his/her room. BP (145/91) and SaO2 (99%) were taken. Procedure explained to the patient and questions were answered. The patient expressed understanding of the procedure. Electrodes were applied without incident. The patient  was placed in bed and the study was started.      Acquisition Notes:     Lights off: 9:49pm        Respiratory events: Hypopneas and Obstructive Apneas noted     ECG:  NSR     Other comments:       The study ordered was a PSG.  The hook-up was performed without issue.      The Pt was placed in bed and lights were out at 9:49pm.  Sleep onset occurred around 10:00pm. During the study stages 1, 2 and REM were noted. The Pt slept supine and on both sides. Moderate snoring  was heard.The Pt appeared to show signs of sleep-disordered breathing as hypopneas and obstructive apneas were noted. These events appeared to be somewhat frequent in nature.      Lights were on at 5:43am.         POST Test:     Patient was awakened.  Electrodes were removed.  The patient was discharged after answering the Post Questionnaire.  Patient stated that  he/she was alert and ok to drive.         Equipment and room cleaned per infection control policy.

## 2019-09-14 ENCOUNTER — Telehealth: Payer: Self-pay | Admitting: Cardiology

## 2019-09-14 MED ORDER — NEBIVOLOL HCL 10 MG PO TABS: 10.0000 mg | ORAL_TABLET | Freq: Every day | ORAL | 3 refills | Status: DC

## 2019-09-14 MED ORDER — ATORVASTATIN CALCIUM 80 MG PO TABS: 80.0000 mg | ORAL_TABLET | Freq: Every day | ORAL | 3 refills | Status: DC

## 2019-09-14 NOTE — Telephone Encounter (Signed)
*  STAT* If patient is at the pharmacy, call can be transferred to refill team.   1. Which medications need to be refilled? (please list name of each medication and dose if known)  atorvastatin (LIPITOR) 80 MG tablet nebivolol (BYSTOLIC) 10 MG tablet  2. Which pharmacy/location (including street and city if local pharmacy) is medication to be sent to?  [id number] - [place]  3. Do they need a 30 day or 90 day supply? 90 with refills  Pt is out of medication

## 2019-09-14 NOTE — Telephone Encounter (Signed)
Pt's medications were sent to pt's pharmacy as requested. Confirmation received.  

## 2019-09-24 ENCOUNTER — Telehealth

## 2019-09-24 NOTE — Telephone Encounter (Signed)
Nocturnal polysomnogram performed.  History of severe sleep disordered breathing; weight loss following bariatric surgery.    474 minutes recorded of which 448.5 minutes spent asleep with a sleep efficiency of 94.6%.  Sleep onset at 13.5 minutes; REM onset prolonged at 377.5 minutes with total REM reduced to 1.8% of sleep time.    65 respiratory events occurred of which 54 hypopnea and 11 obstructive apnea.  The overall AHI was 8.7/h.  REM-related AHI of 22.5/h.  Events primarily supine.  AHI 13.9/h.  Minimal SaO2 85%.  Moderate snoring noted.    Impression: Sleep disordered breathing primarily supine.    Recommendation: Titration study.    Sleep technologist: Please advise patient of study results.  Order has been entered for titration study.

## 2019-09-26 NOTE — Telephone Encounter (Signed)
 Reviewed sleep study results with patient. He expressed understanding and is willing to proceed with a CPAP Titration.

## 2019-09-27 DIAGNOSIS — M66869 Spontaneous rupture of other tendons, unspecified lower leg: Secondary | ICD-10-CM | POA: Insufficient documentation

## 2019-09-27 NOTE — Progress Notes (Signed)
Order for sleep study on file.  Left voicemail for patient to call office to schedule appointment.

## 2019-10-04 ENCOUNTER — Telehealth: Payer: Self-pay | Admitting: Cardiology

## 2019-10-04 NOTE — Telephone Encounter (Signed)
Spoke with pt who has noticed a couple of times his Apple watch has given him the notification that his HR was 50 or less for 5-10 mins.  He denies any s/s like dizziness, lightheadedness, palpitations etc.  States he feels very well.  Advised nebivolol does slow your HR and to continue to monitor.  Advised to make [place] aware if he has any s/s like those listed above or HR is continually low.  Pt is in agreement. He is asking if Dr Marlou Porch can recommend a  Gastroenterologist, A podiatrist, Or an orthopedic surgeon.  He would like to move all his medical care to [facility] if possible. If so he states why can sent them through Portal.

## 2019-10-04 NOTE — Telephone Encounter (Signed)
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  This is fine.  No need to change his beta-blocker.  Okay to refer him to [facility] to refer him to podiatry Okay to refer him to orthopedic surgery-[facility] would be fine  Candee Furbish, MD

## 2019-10-04 NOTE — Telephone Encounter (Signed)
Patient states he has an apple watch, sometimes it tells him that his HR was below 50 for about 5-32mins.  He wants to know if that is something he should be concerned about.

## 2019-10-11 ENCOUNTER — Encounter: Payer: Self-pay | Admitting: Internal Medicine

## 2019-10-29 ENCOUNTER — Telehealth: Payer: Self-pay | Admitting: Podiatry

## 2019-10-29 NOTE — Telephone Encounter (Signed)
Pt left message asking if he had an appt next week at our office.  I returned call and left message that yes he is scheduled to see Dr Amalia Hailey @ 130pm (arrive at 115) on 8.11.2021.Marland KitchenMarland Kitchen

## 2019-10-31 NOTE — Telephone Encounter (Signed)
Pt left message @ 356pm stating he was scheduled to see Dr Amalia Hailey on 8.11 @ 130 and he wanted to cancel that appt. I have called back and told pt I canceled the appt per his message and to call if he wants to reschedule.

## 2019-11-05 ENCOUNTER — Inpatient Hospital Stay: Admit: 2019-11-05 | Discharge: 2019-11-06 | Primary: Family Medicine

## 2019-11-05 DIAGNOSIS — G4733 Obstructive sleep apnea (adult) (pediatric): Secondary | ICD-10-CM

## 2019-11-05 NOTE — Progress Notes (Signed)
 Progress  Notes by Lafe Ryan BRAVO at 11/05/19 2138                Author: Lafe Ryan BRAVO  Service: --  Author Type: Technologist       Filed: 11/06/19 0640  Encounter Date: 11/05/2019  Status: Signed          Editor: Lafe Ryan BRAVO (Technologist)                                         [address] [place], [place] [address]   Tel.  [phone]   Fax. [phone]  [address]   [place], [place] [address]   Tel.  [phone]   Fax. [phone]  [address]   [place], [place] [address]   Tel.  [phone]   Fax. [phone]        Sleep Study Technical Notes           PRE-Test:     Elijah [name] (DOB: Jul 04, 1950 ) arrived in the lobby. Patient was greeted, temperature checked (98.3 ) and screening questions asked. The patient was taken to the Sleep  Center and taken directly to his/her room. BP (140/85) and SaO2 (99%) were taken. Procedure explained to the patient and questions were answered. The patient expressed understanding of the procedure. Electrodes were applied without incident. The patient  was placed in bed and the study was started.      Acquisition Notes:     Lights off: 9:24pm        Respiratory events: Hypopneas noted     ECG:  NSR     PAP titration: 7cm H2O - 12cm H2O     Desensitization Mask(s) Used: ResMed F20 AirTouch Medium full-face mask     Other comments:       The study ordered was a CPAP Titration. The hookup was completed without issue.      The Pt was started on CPAP at 7cm H2O on a ResMed F20 AirTouch Med full-face mask. The Pt was placed in bed and lights were out at 9:24pm. Sleep onset occurred around 9:31pm.      During the study stages 1, 2, and REM were noted. The Pt slept supine and on his right side. Mild snoring was heard.       The Pt appeared to show signs of sleep-disordered breathing as hypopneas were noted. PAP was titrated accordingly to a final resting pressure of 12cm H2O.      Lights were on at 5:48am.      POST Test:      Patient was awakened.  Electrodes were removed.  The patient was discharged after answering the Post Questionnaire.  Patient stated that  he/she was alert and ok to drive.        Equipment and room cleaned per infection control policy.

## 2019-11-07 ENCOUNTER — Ambulatory Visit: Payer: Medicare PPO | Admitting: Podiatry

## 2019-11-15 ENCOUNTER — Telehealth

## 2019-11-15 NOTE — Telephone Encounter (Signed)
Titration sleep study performed.  Polysomnogram demonstrated 65 respiratory events  of which 54 hypopnea and 11 obstructive apnea.  The overall AHI was 8.7/h.  REM-related AHI of 22.5/h.  Events primarily supine.  AHI 13.9/h.  Minimal SaO2 85%.  Moderate snoring noted.  ??  504 minutes recorded of which 482.5 minutes spent asleep with a sleep efficiency of 95.7%.  Sleep onset is 7 minutes; REM onset at 289.5 minutes with total REM representing 10.4% of sleep time.    Four hypopnea occurred with AHI of 0.5/h.  REM related AHI 2.4/h.  Minimal SaO2 84%.    CPAP initiated at 7 cm and increased to 12 cm.  At 10 cm CPAP: 150.7 minutes recorded which 144.7 minutes spent asleep and 40.4 minutes in rem.  AHI 0.8/h.  Minimal SaO2 84%.  At 12 cm CPAP: 80.3 minutes recorded which 75.3 minutes spent asleep and 9.3 minutes in rem.  Minimal SaO2 90%.  AHI 0/h.  Mild snoring noted.    Impression: Sleep disordered breathing prominent in REM sleep.    Recommendation: APAP 9-15 cm  ??  Sleep technologist: Please advise patient of study results.  Order has been entered for APAP 9-15 cm.  Please schedule first compliance appointment.

## 2019-11-19 ENCOUNTER — Other Ambulatory Visit: Payer: Self-pay

## 2019-11-19 ENCOUNTER — Ambulatory Visit (INDEPENDENT_AMBULATORY_CARE_PROVIDER_SITE_OTHER): Payer: Medicare PPO

## 2019-11-19 ENCOUNTER — Ambulatory Visit: Payer: Medicare PPO | Admitting: Podiatry

## 2019-11-19 VITALS — Temp 97.6°F

## 2019-11-19 DIAGNOSIS — M14679 Charcot's joint, unspecified ankle and foot: Secondary | ICD-10-CM

## 2019-11-19 DIAGNOSIS — G629 Polyneuropathy, unspecified: Secondary | ICD-10-CM | POA: Diagnosis not present

## 2019-11-19 DIAGNOSIS — L84 Corns and callosities: Secondary | ICD-10-CM | POA: Diagnosis not present

## 2019-11-19 DIAGNOSIS — L97529 Non-pressure chronic ulcer of other part of left foot with unspecified severity: Secondary | ICD-10-CM | POA: Diagnosis not present

## 2019-11-19 DIAGNOSIS — Z8739 Personal history of other diseases of the musculoskeletal system and connective tissue: Secondary | ICD-10-CM

## 2019-11-19 DIAGNOSIS — M79672 Pain in left foot: Secondary | ICD-10-CM

## 2019-11-21 NOTE — Progress Notes (Signed)
Subjective:   Patient ID: Aaron [name], male   DOB: 69 y.o.   MRN: [id number]   HPI 69 year old male presents the office for concerns of a callus on his left foot which has been ongoing chronically for last 3 to 4 years.  He has a history of osteomyelitis of the left fifth metatarsal.  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  He is currently in the cast on the right side.  Denies any open sores on left lower extremity.  He has no other concerns.   Review of Systems  All other systems reviewed and are negative.  Past Medical History:  Diagnosis Date  . Coronary artery disease   . GERD with apnea   . Hyperlipidemia   . Hypertension   . Hypertrophy of prostate   . Obesity   . OSA on CPAP     Past Surgical History:  Procedure Laterality Date  . CORONARY ARTERY BYPASS GRAFT       Current Outpatient Medications:  .  Acetaminophen 500 MG capsule, Take by mouth., Disp: , Rfl:  .  albuterol (PROAIR HFA) 108 (90 Base) MCG/ACT inhaler, ProAir HFA 90 mcg/actuation aerosol inhaler  TAKE 1 PUFF EVERY 4 HOURS AS NEEDED FOR WHEEZE, Disp: , Rfl:  .  amLODipine (NORVASC) 5 MG tablet, Take 1 tablet by mouth daily., Disp: , Rfl:  .  ascorbic acid (VITAMIN C) 1000 MG tablet, ascorbic acid (vitamin C) 1,000 mg tablet  TAKE 1 TABLET BY MOUTH TWICE A DAY, Disp: , Rfl:  .  aspirin 325 MG tablet, Take 325 mg by mouth daily., Disp: , Rfl:  .  atorvastatin (LIPITOR) 80 MG tablet, Take 1 tablet (80 mg total) by mouth daily., Disp: 90 tablet, Rfl: 3 .  b complex vitamins tablet, Take 1 tablet by mouth daily., Disp: , Rfl:  .  cefadroxil (DURICEF) 500 MG capsule, cefadroxil 500 mg capsule, Disp: , Rfl:  .  chlorthalidone (HYGROTON) 25 MG tablet, Take 1 tablet by mouth daily., Disp: , Rfl:  .  Cholecalciferol 125 MCG (5000 UT) TABS, Vitamin D3 125 mcg (5,000 unit) tablet  TAKE 1 TABLET (5,000 UNITS TOTAL) BY MOUTH ONCE DAILY, Disp: , Rfl:  .   clotrimazole-betamethasone (LOTRISONE) cream, clotrimazole-betamethasone 1 %-0.05 % topical cream  APPLY TO AFFECTED AREA TWICE A DAY FOR 14 DAYS, Disp: , Rfl:  .  Cobalamin Combinations (VITAMIN B12-FOLIC ACID) 657-846 MCG TABS, 1 tablet once daily, Disp: , Rfl:  .  Coenzyme Q10 (COQ-10 PO), Take 1 tablet by mouth daily., Disp: , Rfl:  .  DULoxetine (CYMBALTA) 60 MG capsule, duloxetine 60 mg capsule,delayed release  TAKE 1 CAPSULE BY MOUTH EVERY DAY, Disp: , Rfl:  .  gabapentin (NEURONTIN) 300 MG capsule, gabapentin 300 mg capsule  TAKE 1 CAPSULE BY MOUTH TWICE A DAY, Disp: , Rfl:  .  HYDROcodone-acetaminophen (NORCO/VICODIN) 5-325 MG tablet, hydrocodone 5 mg-acetaminophen 325 mg tablet  TAKE 1 TABLETS EVERY 6 HOURS AS NEEDED FOR PAIN, MAX OF 6 TABLETS PER DAY, Disp: , Rfl:  .  lisinopril (PRINIVIL,ZESTRIL) 20 MG tablet, Take 1 tablet by mouth daily., Disp: , Rfl:  .  meloxicam (MOBIC) 7.5 MG tablet, meloxicam 7.5 mg tablet  TAKE 1 TABLET (7.5 MG TOTAL) BY MOUTH 2 (TWO) TIMES DAILY FOR 3 DAYS, Disp: , Rfl:  .  nebivolol (BYSTOLIC) 10 MG tablet, Take 1 tablet (10 mg total) by mouth daily., Disp: 90 tablet, Rfl: 3 .  oxyCODONE (OXY IR/ROXICODONE) 5 MG immediate release tablet, oxycodone 5 mg tablet  TAKE 1 TABLET BY MOUTH EVERY 6 (SIX) HOURS AS NEEDED FOR PAIN (MAX OF 6 TABLETS PER DAY), Disp: , Rfl:  .  pantoprazole (PROTONIX) 40 MG tablet, pantoprazole 40 mg tablet,delayed release  TAKE 1 TABLET BY MOUTH EVERY DAY, Disp: , Rfl:  .  pneumococcal 13-valent conjugate vaccine (PREVNAR 13) SUSP injection, Prevnar 13 (PF) 0.5 mL intramuscular syringe  PHARMACY ADMINISTERED, Disp: , Rfl:  .  potassium chloride (K-DUR) 10 MEQ tablet, Take 1 tablet by mouth daily., Disp: , Rfl:  .  promethazine (PHENERGAN) 25 MG tablet, promethazine 25 mg tablet  TAKE 1 TABLET (25 MG TOTAL) BY MOUTH EVERY 8 (EIGHT) HOURS AS NEEDED FOR NAUSEA FOR UP TO 7 DAYS, Disp: , Rfl:  .  sulfamethoxazole-trimethoprim (BACTRIM DS) 800-160  MG tablet, , Disp: , Rfl:  .  tamsulosin (FLOMAX) 0.4 MG CAPS capsule, tamsulosin 0.4 mg capsule  TAKE 1 CAPSULE BY MOUTH EVERY DAY, Disp: , Rfl:  .  vitamin B-12 (CYANOCOBALAMIN) 500 MCG tablet, Take 500 mcg by mouth daily., Disp: , Rfl:  .  zolpidem (AMBIEN) 10 MG tablet, zolpidem 10 mg tablet, Disp: , Rfl:   No Known Allergies        Objective:  Physical Exam  General: AAO x3, NAD  Dermatological: Skin is warm, dry and supple bilateral.  There are no open sores, no preulcerative lesions, no rash or signs of infection present.  Vascular: Dorsalis Pedis artery and Posterior Tibial artery pedal pulses palpable in the left side there is no pain with calf compression, swelling, warmth, erythema.   Neruologic: Sensation decreased with Semmes Weinstein monofilament  Musculoskeletal: On the left foot submetatarsal 4 hyperkeratotic lesion with dried blood.  Upon debridement there is no underlying ulceration drainage or any signs of infection.  Gait: Unassisted, Nonantalgic.       Assessment:   69 year old male preulcerative callus left foot    Plan:  -Treatment options discussed including all alternatives, risks, and complications -Etiology of symptoms were discussed -Shelby debrided the hyperkeratotic lesion with any complications or bleeding.  Recommended offloading.  Prescription was given for [facility] for accommodative orthotics to help offload.  Trula Slade DPM

## 2019-11-27 NOTE — Telephone Encounter (Signed)
Detailed voicemail left giving basics of results and next steps. He was encouraged to call the [employer] office if he has any questions about his study. Fax DME order & Schedule 1st adherence visit in 60 to 90 days.

## 2019-11-27 NOTE — Progress Notes (Signed)
Faxed PAP order to medical equipment company. Left voicemail for patient to call back and schedule 1st adherence visit.

## 2019-11-29 ENCOUNTER — Other Ambulatory Visit: Payer: Self-pay | Admitting: Podiatry

## 2019-11-29 ENCOUNTER — Other Ambulatory Visit: Payer: Self-pay

## 2019-11-29 ENCOUNTER — Ambulatory Visit (AMBULATORY_SURGERY_CENTER): Payer: Medicare PPO | Admitting: *Deleted

## 2019-11-29 VITALS — Ht 73.0 in | Wt 220.0 lb

## 2019-11-29 DIAGNOSIS — Z86010 Personal history of colonic polyps: Secondary | ICD-10-CM

## 2019-11-29 DIAGNOSIS — L97529 Non-pressure chronic ulcer of other part of left foot with unspecified severity: Secondary | ICD-10-CM

## 2019-11-29 MED ORDER — NA SULFATE-K SULFATE-MG SULF 17.5-3.13-1.6 GM/177ML PO SOLN: ORAL | 0 refills | Status: DC

## 2019-11-29 NOTE — Progress Notes (Signed)
Patient's pre-visit was done today over the phone with the patient due to COVID-19 pandemic. Name,DOB and address verified. Insurance verified. Packet of Prep instructions mailed to patient including copy of a consent form -pt is aware.  Patient understands to call us back with any questions or concerns. COVID-19 vaccines completed per pt. Pt is aware that care partner will wait in the car during procedure; if they feel like they will be too hot or cold to wait in the car; they may wait in the 4 th floor lobby. Patient is aware to bring only one care partner. We want them to wear a mask (we do not have any that we can provide them), practice social distancing, and we will check their temperatures when they get here.  I did remind the patient that their care partner needs to stay in the parking lot the entire time and have a cell phone available, we will call them when the pt is ready for discharge. Patient will wear mask into building.

## 2019-11-29 NOTE — Progress Notes (Signed)
Faxed PAP order to medical equipment company.

## 2019-12-05 ENCOUNTER — Encounter: Payer: Self-pay | Admitting: Internal Medicine

## 2019-12-10 ENCOUNTER — Encounter

## 2019-12-10 MED ORDER — PANTOPRAZOLE 40 MG TAB, DELAYED RELEASE
40 mg | ORAL_TABLET | ORAL | 1 refills | Status: AC
Start: 2019-12-10 — End: ?

## 2019-12-14 ENCOUNTER — Other Ambulatory Visit: Payer: Self-pay

## 2019-12-14 ENCOUNTER — Ambulatory Visit (AMBULATORY_SURGERY_CENTER): Payer: Medicare PPO | Admitting: Gastroenterology

## 2019-12-14 ENCOUNTER — Encounter: Payer: Self-pay | Admitting: Gastroenterology

## 2019-12-14 VITALS — BP 100/70 | HR 49 | Temp 96.9°F | Resp 14 | Ht 73.0 in | Wt 220.0 lb

## 2019-12-14 DIAGNOSIS — D123 Benign neoplasm of transverse colon: Secondary | ICD-10-CM

## 2019-12-14 DIAGNOSIS — Z86010 Personal history of colonic polyps: Secondary | ICD-10-CM | POA: Diagnosis not present

## 2019-12-14 MED ORDER — SODIUM CHLORIDE 0.9 % IV SOLN: 500.0000 mL | INTRAVENOUS | Status: DC

## 2019-12-14 NOTE — Progress Notes (Signed)
VS- Judson Roch Monday RN  Pt has a boot on right leg  Pt's states no medical or surgical changes since previsit or office visit.

## 2019-12-14 NOTE — Op Note (Signed)
[facility] Patient Name: Aaron [name] Procedure Date: 12/14/2019 7:59 AM MRN: [id number] Endoscopist: Ladene Artist , MD Age: 69 Referring MD:  Date of Birth: July 13, 1950 Gender: Male Account #: [id number] Procedure:                Colonoscopy Indications:              Surveillance: Personal history of adenomatous                            polyps on last colonoscopy 5 years ago Medicines:                Monitored Anesthesia Care Procedure:                Pre-Anesthesia Assessment:                           - Prior to the procedure, a History and Physical                            was performed, and patient medications and                            allergies were reviewed. The patient's tolerance of                            previous anesthesia was also reviewed. The risks                            and benefits of the procedure and the sedation                            options and risks were discussed with the patient.                            All questions were answered, and informed consent                            was obtained. Prior Anticoagulants: The patient has                            taken no previous anticoagulant or antiplatelet                            agents. ASA Grade Assessment: II - A patient with                            mild systemic disease. After reviewing the risks                            and benefits, the patient was deemed in                            satisfactory condition to undergo the procedure.  After obtaining informed consent, the colonoscope                            was passed under direct vision. Throughout the                            procedure, the patient's blood pressure, pulse, and                            oxygen saturations were monitored continuously. The                            Colonoscope was introduced through the anus and                            advanced to the the cecum,  identified by                            appendiceal orifice and ileocecal valve. The                            ileocecal valve, appendiceal orifice, and rectum                            were photographed. The quality of the bowel                            preparation was good. The colonoscopy was performed                            without difficulty. The patient tolerated the                            procedure well. Scope In: 8:06:09 AM Scope Out: 8:22:04 AM Scope Withdrawal Time: 0 hours 11 minutes 16 seconds  Total Procedure Duration: 0 hours 15 minutes 55 seconds  Findings:                 The perianal and digital rectal examinations were                            normal.                           A 8 mm polyp was found in the transverse colon. The                            polyp was sessile. The polyp was removed with a                            cold snare. Resection and retrieval were complete.                           Multiple medium-mouthed diverticula were found in  the left colon. There was no evidence of                            diverticular bleeding.                           Internal hemorrhoids were found during                            retroflexion. The hemorrhoids were small and Grade                            I (internal hemorrhoids that do not prolapse).                           The exam was otherwise without abnormality on                            direct and retroflexion views. Complications:            No immediate complications. Estimated blood loss:                            None. Estimated Blood Loss:     Estimated blood loss: none. Impression:               - One 8 mm polyp in the transverse colon, removed                            with a cold snare. Resected and retrieved.                           - Mild diverticulosis in the left colon.                           - Internal hemorrhoids.                           -  The examination was otherwise normal on direct                            and retroflexion views. Recommendation:           - Repeat colonoscopy after studies are complete for                            surveillance based on pathology results.                           - Patient has a contact number available for                            emergencies. The signs and symptoms of potential                            delayed complications were discussed with the  patient. Return to normal activities tomorrow.                            Written discharge instructions were provided to the                            patient.                           - High fiber diet.                           - Continue present medications.                           - Await pathology results. Ladene Artist, MD 12/14/2019 8:25:16 AM This report has been signed electronically.

## 2019-12-14 NOTE — Progress Notes (Signed)
Report to PACU, RN, vss, BBS= Clear.  

## 2019-12-14 NOTE — Progress Notes (Signed)
Called to room to assist during endoscopic procedure.  Patient ID and intended procedure confirmed with present staff. Received instructions for my participation in the procedure from the performing physician.  

## 2019-12-14 NOTE — Patient Instructions (Signed)
HANDOUTS PROVIDED ON: HIGH FIBER DIET, POLYPS, DIVERTICULOSIS, & HEMORRHOIDS  The polyp removed today have been sent for pathology.  The results can take 1-3 weeks to receive.  When your next colonoscopy should occur will be based on the pathology results.    You may resume your previous diet and medication schedule.  Thank you for allowing us to care for you today!!!   YOU HAD AN ENDOSCOPIC PROCEDURE TODAY AT THE [facility]:   Refer to the procedure report that was given to you for any specific questions about what was found during the examination.  If the procedure report does not answer your questions, please call your gastroenterologist to clarify.  If you requested that your care partner not be given the details of your procedure findings, then the procedure report has been included in a sealed envelope for you to review at your convenience later.  YOU SHOULD EXPECT: Some feelings of bloating in the abdomen. Passage of more gas than usual.  Walking can help get rid of the air that was put into your GI tract during the procedure and reduce the bloating. If you had a lower endoscopy (such as a colonoscopy or flexible sigmoidoscopy) you may notice spotting of blood in your stool or on the toilet paper. If you underwent a bowel prep for your procedure, you may not have a normal bowel movement for a few days.  Please Note:  You might notice some irritation and congestion in your nose or some drainage.  This is from the oxygen used during your procedure.  There is no need for concern and it should clear up in a day or so.  SYMPTOMS TO REPORT IMMEDIATELY:   Following lower endoscopy (colonoscopy or flexible sigmoidoscopy):  Excessive amounts of blood in the stool  Significant tenderness or worsening of abdominal pains  Swelling of the abdomen that is new, acute  Fever of 100F or higher  For urgent or emergent issues, a gastroenterologist can be reached at any hour by calling  [phone]. Do not use MyChart messaging for urgent concerns.    DIET:  We do recommend a small meal at first, but then you may proceed to your regular diet.  Drink plenty of fluids but you should avoid alcoholic beverages for 24 hours.  ACTIVITY:  You should plan to take it easy for the rest of today and you should NOT DRIVE or use heavy machinery until tomorrow (because of the sedation medicines used during the test).    FOLLOW UP: Our staff will call the number listed on your records 48-72 hours following your procedure to check on you and address any questions or concerns that you may have regarding the information given to you following your procedure. If we do not reach you, we will leave a message.  We will attempt to reach you two times.  During this call, we will ask if you have developed any symptoms of COVID 19. If you develop any symptoms (ie: fever, flu-like symptoms, shortness of breath, cough etc.) before then, please call [phone].  If you test positive for Covid 19 in the 2 weeks post procedure, please call and report this information to us.    If any biopsies were taken you will be contacted by phone or by letter within the next 1-3 weeks.  Please call us at [phone] if you have not heard about the biopsies in 3 weeks.    SIGNATURES/CONFIDENTIALITY: You and/or your care partner have signed   paperwork which will be entered into your electronic medical record.  These signatures attest to the fact that that the information above on your After Visit Summary has been reviewed and is understood.  Full responsibility of the confidentiality of this discharge information lies with you and/or your care-partner.

## 2019-12-18 ENCOUNTER — Telehealth: Payer: Self-pay

## 2019-12-18 NOTE — Telephone Encounter (Signed)
  Follow up Call-  Call back number 12/14/2019  Post procedure Call Back phone  # [phone]- wife's phone  Permission to leave phone message Yes  Some recent data might be hidden     Patient questions:  Do you have a fever, pain , or abdominal swelling? No. Pain Score  0 *  Have you tolerated food without any problems? Yes.    Have you been able to return to your normal activities? Yes.    Do you have any questions about your discharge instructions: Diet   No. Medications  No. Follow up visit  No.  Do you have questions or concerns about your Care? No.  Actions: * If pain score is 4 or above: No action needed, pain <4.  1. Have you developed a fever since your procedure? no  2.   Have you had an respiratory symptoms (SOB or cough) since your procedure? no  3.   Have you tested positive for COVID 19 since your procedure no  4.   Have you had any family members/close contacts diagnosed with the COVID 19 since your procedure?  no   If yes to any of these questions please route to Joylene John, RN and Joella Prince, RN

## 2019-12-24 ENCOUNTER — Encounter: Payer: Self-pay | Admitting: Gastroenterology

## 2020-06-14 ENCOUNTER — Other Ambulatory Visit: Payer: Self-pay | Admitting: Cardiology

## 2020-07-30 NOTE — Telephone Encounter (Signed)
This encounter was created in error - please disregard.

## 2020-08-22 ENCOUNTER — Other Ambulatory Visit: Payer: Self-pay | Admitting: Cardiology

## 2020-10-06 ENCOUNTER — Other Ambulatory Visit: Payer: Self-pay | Admitting: Cardiology

## 2020-10-18 ENCOUNTER — Other Ambulatory Visit: Payer: Self-pay | Admitting: Cardiology

## 2020-10-20 NOTE — Telephone Encounter (Signed)
CONTACTED PATIENT AND SCHEDULED A FOLLOW UP WITH DR Marlou Porch.  Rx(s) sent to pharmacy electronically.

## 2021-01-08 ENCOUNTER — Ambulatory Visit: Payer: Medicare PPO | Admitting: Podiatry

## 2021-01-08 ENCOUNTER — Other Ambulatory Visit: Payer: Self-pay

## 2021-01-08 ENCOUNTER — Ambulatory Visit (INDEPENDENT_AMBULATORY_CARE_PROVIDER_SITE_OTHER): Payer: Medicare PPO

## 2021-01-08 DIAGNOSIS — G629 Polyneuropathy, unspecified: Secondary | ICD-10-CM | POA: Diagnosis not present

## 2021-01-08 DIAGNOSIS — M14671 Charcot's joint, right ankle and foot: Secondary | ICD-10-CM | POA: Diagnosis not present

## 2021-01-08 DIAGNOSIS — M14679 Charcot's joint, unspecified ankle and foot: Secondary | ICD-10-CM | POA: Diagnosis not present

## 2021-01-08 DIAGNOSIS — L97512 Non-pressure chronic ulcer of other part of right foot with fat layer exposed: Secondary | ICD-10-CM | POA: Diagnosis not present

## 2021-01-08 DIAGNOSIS — Z8739 Personal history of other diseases of the musculoskeletal system and connective tissue: Secondary | ICD-10-CM | POA: Diagnosis not present

## 2021-01-13 ENCOUNTER — Encounter: Payer: Self-pay | Admitting: Podiatry

## 2021-01-13 NOTE — Progress Notes (Signed)
  Subjective:  Patient ID: Aaron [name], male    DOB: 1951-02-20,  MRN: [id number]  Chief Complaint  Patient presents with   Foot Pain    right foot sore on the bottom-requested see Dr.Moreen Piggott    70 y.o. male presents with the above complaint. History confirmed with patient.  He was diagnosed with Charcot foot deformity many years ago and has undergone multiple surgeries including reconstruction at [facility].  He has had a persistent wound on the plantar medial arch where there is prominence.  He wears a Counselling psychologist.  He currently has been receiving care for this at the [facility] but feels like the care is inconsistent and inadequate.  Objective:  Physical Exam: warm, good capillary refill, normal DP and PT pulses, reduced sensation at diffusely throughout the feet, and ulceration at plantar medial right foot measuring 1.2 cm x 0.6 cm x 0.3 cm.     Radiographs: Multiple views x-ray of the right foot: Status post Charcot reconstruction with a large metallic implant and intramedullary and screw fixation Assessment:   1. Charcot's joint of foot, unspecified laterality   2. Neuropathy   3. History of osteomyelitis      Plan:  Patient was evaluated and treated and all questions answered.  Reviewed the radiograph and clinical exam findings in detail.  We discussed his ongoing wound care.  Currently the do not seem to be doing much other than intermittent debridement and ointments at the [place].  I reviewed his x-rays and he has a large metallic implant in the area of the ulceration this likely is causing pressure on the wound.  Unfortunate think this means that we likely would not be able to consider any sort of exostectomy to offload the area.  Could consider advanced tissue grafting and total contact casting and I will discuss with him further.  I was able to see his records from [facility] which showed no osteomyelitis in the resected bone.  I will see him back in a few  weeks for further discussion and also discussed him when I see him with his wife next week  No follow-ups on file.

## 2021-01-16 ENCOUNTER — Other Ambulatory Visit: Payer: Self-pay | Admitting: Cardiology

## 2021-01-20 ENCOUNTER — Ambulatory Visit: Payer: Medicare PPO | Admitting: Podiatry

## 2021-02-10 ENCOUNTER — Ambulatory Visit: Payer: Medicare PPO | Admitting: Podiatry

## 2021-02-12 ENCOUNTER — Ambulatory Visit: Payer: Medicare PPO | Admitting: Podiatry
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
## 2021-03-10 ENCOUNTER — Other Ambulatory Visit: Payer: Self-pay

## 2021-03-10 ENCOUNTER — Encounter: Payer: Self-pay | Admitting: Cardiology

## 2021-03-10 ENCOUNTER — Ambulatory Visit: Payer: Medicare PPO | Admitting: Cardiology

## 2021-03-10 ENCOUNTER — Ambulatory Visit (HOSPITAL_COMMUNITY)
Admission: RE | Admit: 2021-03-10 | Discharge: 2021-03-10 | Disposition: A | Discharge status: Home / Self Care | Payer: Medicare PPO | Source: Ambulatory Visit | Attending: Cardiology | Admitting: Cardiology

## 2021-03-10 VITALS — BP 120/70 | HR 61 | Ht 73.0 in | Wt 223.0 lb

## 2021-03-10 DIAGNOSIS — R0600 Dyspnea, unspecified: Secondary | ICD-10-CM | POA: Diagnosis not present

## 2021-03-10 DIAGNOSIS — I2510 Atherosclerotic heart disease of native coronary artery without angina pectoris: Secondary | ICD-10-CM | POA: Diagnosis not present

## 2021-03-10 DIAGNOSIS — R0602 Shortness of breath: Secondary | ICD-10-CM | POA: Diagnosis not present

## 2021-03-10 DIAGNOSIS — Z951 Presence of aortocoronary bypass graft: Secondary | ICD-10-CM | POA: Insufficient documentation

## 2021-03-10 DIAGNOSIS — I10 Essential (primary) hypertension: Secondary | ICD-10-CM | POA: Diagnosis not present

## 2021-03-10 DIAGNOSIS — E785 Hyperlipidemia, unspecified: Secondary | ICD-10-CM | POA: Insufficient documentation

## 2021-03-10 DIAGNOSIS — M86171 Other acute osteomyelitis, right ankle and foot: Secondary | ICD-10-CM

## 2021-03-10 DIAGNOSIS — I119 Hypertensive heart disease without heart failure: Secondary | ICD-10-CM | POA: Diagnosis not present

## 2021-03-10 DIAGNOSIS — E7800 Pure hypercholesterolemia, unspecified: Secondary | ICD-10-CM

## 2021-03-10 LAB — ECHOCARDIOGRAM COMPLETE
Area-P 1/2: 2.48 cm2
Calc EF: 65.9 %
Height: 73 in
S' Lateral: 3.2 cm
Single Plane A2C EF: 60.6 %
Single Plane A4C EF: 68.7 %
Weight: 3568 oz

## 2021-03-10 MED ORDER — ASPIRIN EC 81 MG PO TBEC: 81.0000 mg | DELAYED_RELEASE_TABLET | Freq: Every day | ORAL | 3 refills | Status: AC

## 2021-03-10 NOTE — Assessment & Plan Note (Signed)
Continue with atorvastatin 80 mg a day LDL previously 70.

## 2021-03-10 NOTE — Assessment & Plan Note (Signed)
Overall excellent control currently 120/70.  Medications reviewed as above.  On Bystolic 10 mg.  He has lost a significant amount of weight.

## 2021-03-10 NOTE — Assessment & Plan Note (Signed)
Still wearing orthotics.  Has been a challenging situation.  Has seen [facility] MD
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
## 2021-03-10 NOTE — Patient Instructions (Addendum)
Medication Instructions:  Please decrease Aspirin to 81 mg a day. Continue all other medications as listed.  *If you need a refill on your cardiac medications before your next appointment, please call your pharmacy*  Testing/Procedures: Your physician has requested that you have an echocardiogram. Echocardiography is a painless test that uses sound waves to create images of your heart. It provides your doctor with information about the size and shape of your heart and how well your hearts chambers and valves are working. This procedure takes approximately one hour. There are no restrictions for this procedure.  Follow-Up: At [facility], you and your health needs are our priority.  As part of our continuing mission to provide you with exceptional heart care, we have created designated Provider Care Teams.  These Care Teams include your primary Cardiologist (physician) and Advanced Practice Providers (APPs -  Physician Assistants and Nurse Practitioners) who all work together to provide you with the care you need, when you need it.  We recommend signing up for the patient portal called "MyChart".  Sign up information is provided on this After Visit Summary.  MyChart is used to connect with patients for Virtual Visits (Telemedicine).  Patients are able to view lab/test results, encounter notes, upcoming appointments, etc.  Non-urgent messages can be sent to your provider as well.   To learn more about what you can do with MyChart, go to [email or web address].    Your next appointment:   6 month(s)  The format for your next appointment:   In Person  Provider:   Candee Furbish, MD    Thank you for choosing [facility]!!

## 2021-04-16 ENCOUNTER — Other Ambulatory Visit: Payer: Self-pay | Admitting: Cardiology

## 2021-05-22 ENCOUNTER — Other Ambulatory Visit: Payer: Self-pay | Admitting: Cardiology

## 2021-06-09 ENCOUNTER — Telehealth: Payer: Self-pay

## 2021-08-25 ENCOUNTER — Ambulatory Visit (INDEPENDENT_AMBULATORY_CARE_PROVIDER_SITE_OTHER): Payer: Medicare PPO

## 2021-08-25 ENCOUNTER — Ambulatory Visit: Payer: Medicare PPO | Admitting: Podiatry

## 2021-08-25 DIAGNOSIS — L97512 Non-pressure chronic ulcer of other part of right foot with fat layer exposed: Secondary | ICD-10-CM

## 2021-08-25 DIAGNOSIS — L97514 Non-pressure chronic ulcer of other part of right foot with necrosis of bone: Secondary | ICD-10-CM | POA: Diagnosis not present

## 2021-08-25 MED ORDER — DOXYCYCLINE HYCLATE 100 MG PO TABS: 100.0000 mg | ORAL_TABLET | Freq: Two times a day (BID) | ORAL | 0 refills | Status: DC

## 2021-08-25 NOTE — Progress Notes (Signed)
  Subjective:  Patient ID: Aaron [name], male    DOB: October 30, 1950,  MRN: [id number]  Chief Complaint  Patient presents with   Toe Pain     right toe pain/ discuss possible amputation/ pts states has bone infection    71 y.o. male presents with the above complaint. History confirmed with patient.  He returns for follow-up with a new issue he recently developed an ulceration on the inside of the fourth toe.  He had x-rays completed at his doctor at the [place] and they had recommended amputation.  Objective:  Physical Exam: warm, good capillary refill and normal DP and PT pulses.  He has dense neuropathy throughout the lower extremities with loss of protective sensation.  On the left foot he has a submetatarsal 4 callus.  On the right foot he has chronic collapse of the medial arch with a chronic ulceration here.  He has a new ulceration approximately 3 mm in diameter which probes directly to bone on the medial fourth toe.  Minor erythema no purulence or ascending cellulitis.  No drainage noted.  Fibrotic wound base.   Radiographs: New radiographs taken today of the right foot: New erosion of the medial head of the proximal phalanx Assessment:   1. 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  We discussed treatment for his likely osteomyelitis including antibiotic and surgical treatment.  We discussed further diagnostic testing including MRI but this would likely be skewed with artifact and a bone scan could confirm osteomyelitis but likely would not change management and would only delay care.  He is currently taking doxycycline and this is kept it suppressed so far.  I recommended keeping him on this and sent a new prescription for this.  We discussed complete amputation of the toe versus partial local excision.  I discussed with him that it is difficult to tell how extensive the osteomyelitis has spread throughout  the proximal phalanx, he does not have any acute infective symptoms from the wound like cellulitis or purulent drainage currently so expect this is going to be a chronic localized infection.  Discussed the option of salvaging the toe with partial bone excision essentially arthroplasty of the proximal phalanx.  We discussed the risk that if pathology shows there is persistent infection he will need to be return to the operating room for full amputation which he understands.  We discussed all risk benefits and potential complications of the procedure including but not limited to pain, swelling, infection, scar, numbness which may be temporary or permanent, chronic pain, stiffness, nerve pain or damage, wound healing problems and need for further surgery.  He understands and wishes to proceed.  Surgery will be scheduled for this Friday.  A culture was taken of the wound there is no active drainage currently.   Surgical plan:  Procedure: -Partial bone excision right fourth toe  Location: -[facility]  Anesthesia plan: -IV sedation with local  Postoperative pain plan: - Tylenol 1000 mg every 6 hours, gabapentin 300 mg every 8 hours x5 days, oxycodone 5 mg 1-2 tabs every 6 hours only as needed  DVT prophylaxis: -None required  WB Restrictions / DME needs: -WBAT in surgical shoe which we will dispense postop

## 2021-08-26 ENCOUNTER — Telehealth: Payer: Self-pay | Admitting: *Deleted

## 2021-08-27 ENCOUNTER — Telehealth: Payer: Self-pay

## 2021-08-27 NOTE — Telephone Encounter (Signed)
DOS 08/28/2021  PARTIAL BONE REMOVAL RT 4TH - 28124  [employer]  The following codes do not require a pre-authorization Created on 08/27/2021  Payer [place] 03/29/2020 - 03/28/9998  Service info [phone] Partial excision (craterization, saucerization, sequestrectomy, or diaphysectomy) bone (eg, osteomyelitis or  bossing); phalanx of toe

## 2021-08-28 ENCOUNTER — Other Ambulatory Visit: Payer: Self-pay | Admitting: Podiatry

## 2021-08-28 DIAGNOSIS — M86171 Other acute osteomyelitis, right ankle and foot: Secondary | ICD-10-CM | POA: Diagnosis not present

## 2021-08-28 MED ORDER — OXYCODONE HCL 5 MG PO TABS: 5.0000 mg | ORAL_TABLET | ORAL | 0 refills | Status: AC | PRN

## 2021-08-28 MED ORDER — ACETAMINOPHEN 500 MG PO TABS: 1000.0000 mg | ORAL_TABLET | Freq: Four times a day (QID) | ORAL | 0 refills | Status: AC | PRN

## 2021-08-28 NOTE — Progress Notes (Signed)
08/28/21 partial bone excision of toe

## 2021-08-29 LAB — WOUND CULTURE
MICRO NUMBER:: 13458427
RESULT:: NO GROWTH
SPECIMEN QUALITY:: ADEQUATE

## 2021-09-03 ENCOUNTER — Encounter: Payer: Self-pay | Admitting: Podiatry

## 2021-09-03 ENCOUNTER — Ambulatory Visit (INDEPENDENT_AMBULATORY_CARE_PROVIDER_SITE_OTHER): Payer: Medicare PPO | Admitting: Podiatry

## 2021-09-03 DIAGNOSIS — L97514 Non-pressure chronic ulcer of other part of right foot with necrosis of bone: Secondary | ICD-10-CM

## 2021-09-03 MED ORDER — DOXYCYCLINE HYCLATE 100 MG PO TABS: 100.0000 mg | ORAL_TABLET | Freq: Two times a day (BID) | ORAL | 0 refills | Status: AC

## 2021-09-07 ENCOUNTER — Encounter: Payer: Self-pay | Admitting: Podiatry

## 2021-09-07 NOTE — Progress Notes (Signed)
  Subjective:  Patient ID: Aaron [name], male    DOB: 03/20/1951,  MRN: [id number]  Chief Complaint  Patient presents with   Routine Post Op       POV #1 DOS 08/28/2021 PARTIAL TOE REMOVAL 4TH TOE RT      71 y.o. male returns for post-op check.  Overall doing well  Review of Systems: Negative except as noted in the HPI. Denies N/V/F/Ch.   Objective:  There were no vitals filed for this visit. There is no height or weight on file to calculate BMI. Constitutional Well developed. Well nourished.  Vascular Foot warm and well perfused. Capillary refill normal to all digits.  Calf is soft and supple, no posterior calf or knee pain, negative Homans' sign  Neurologic Normal speech. Oriented to person, place, and time. Epicritic sensation to light touch grossly reduced  Dermatologic Skin healing well without signs of infection. Skin edges well coapted without signs of infection.  Orthopedic: He has very little edema around the toe   Pathology results show osteomyelitis Assessment:   1. 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  S/p foot surgery right -Progressing as expected post-operatively. -Plan to remove sutures in 2 weeks -May change dressings as needed to access the midfoot amputation site, just use gauze over the toe -We will continue his doxycycline for suppression which she already takes for previous chronic foot infection.  90-day supply sent to pharmacy.  He is on this for the rest of his life.  Return in about 2 weeks (around 09/17/2021) for suture removal.

## 2021-09-17 ENCOUNTER — Ambulatory Visit (INDEPENDENT_AMBULATORY_CARE_PROVIDER_SITE_OTHER): Payer: Medicare PPO | Admitting: Podiatry

## 2021-09-17 DIAGNOSIS — L97522 Non-pressure chronic ulcer of other part of left foot with fat layer exposed: Secondary | ICD-10-CM | POA: Diagnosis not present

## 2021-09-17 DIAGNOSIS — L97514 Non-pressure chronic ulcer of other part of right foot with necrosis of bone: Secondary | ICD-10-CM

## 2021-09-20 LAB — WOUND CULTURE
MICRO NUMBER:: 13559671
SPECIMEN QUALITY:: ADEQUATE

## 2021-09-21 ENCOUNTER — Encounter: Payer: Self-pay | Admitting: Podiatry

## 2021-09-24 ENCOUNTER — Ambulatory Visit: Payer: Medicare PPO | Admitting: Cardiology

## 2021-09-24 ENCOUNTER — Encounter: Payer: Self-pay | Admitting: Cardiology

## 2021-09-24 DIAGNOSIS — I10 Essential (primary) hypertension: Secondary | ICD-10-CM | POA: Diagnosis not present

## 2021-09-24 DIAGNOSIS — E7800 Pure hypercholesterolemia, unspecified: Secondary | ICD-10-CM | POA: Diagnosis not present

## 2021-09-24 DIAGNOSIS — I2510 Atherosclerotic heart disease of native coronary artery without angina pectoris: Secondary | ICD-10-CM | POA: Diagnosis not present

## 2021-09-24 NOTE — Patient Instructions (Addendum)
Medication Instructions:  The current medical regimen is effective;  continue present plan and medications.  *If you need a refill on your cardiac medications before your next appointment, please call your pharmacy*  Follow-Up: At [facility], you and your health needs are our priority.  As part of our continuing mission to provide you with exceptional heart care, we have created designated Provider Care Teams.  These Care Teams include your primary Cardiologist (physician) and Advanced Practice Providers (APPs -  Physician Assistants and Nurse Practitioners) who all work together to provide you with the care you need, when you need it.  We recommend signing up for the patient portal called "MyChart".  Sign up information is provided on this After Visit Summary.  MyChart is used to connect with patients for Virtual Visits (Telemedicine).  Patients are able to view lab/test results, encounter notes, upcoming appointments, etc.  Non-urgent messages can be sent to your provider as well.   To learn more about what you can do with MyChart, go to [email or web address].    Your next appointment:   6 month(s)  The format for your next appointment:   In Person  Provider:   Candee Furbish, MD {    Important Information About Sugar
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
## 2021-09-24 NOTE — Assessment & Plan Note (Signed)
Atorvastatin 80 mg.  LDL 70 previously.  Doing very well.  No myalgias.

## 2021-09-24 NOTE — Assessment & Plan Note (Signed)
CABG was in 2009.  No chest pain no anginal symptoms.  Stress test 2021 low risk.  Continue with goal-directed medical therapy.  Aspirin 81 mg.  On Bystolic 10 mg a day.

## 2021-09-24 NOTE — Assessment & Plan Note (Signed)
Excellent control.  Continue with current medications.

## 2021-10-08 ENCOUNTER — Encounter: Payer: Medicare PPO | Admitting: Podiatry

## 2021-11-10 NOTE — Telephone Encounter (Signed)
Patient LVM requesting study results from 2017. He can be reached at [phone].

## 2021-12-24 NOTE — Telephone Encounter (Signed)
error 

## 2022-01-26 ENCOUNTER — Telehealth: Payer: Self-pay | Admitting: Cardiology

## 2022-01-26 NOTE — Telephone Encounter (Signed)
New Message:    Patient is living in [place]. He wants to know I if he can do a Virtual Visit for his appointment on 03-04-22,just this time please?Marland Kitchen

## 2022-01-26 NOTE — Telephone Encounter (Signed)
OK per Dr Marlou Porch

## 2022-01-26 NOTE — Telephone Encounter (Signed)
Left message for pt OK per Dr Marlou Porch to do a MyChart Virtual Visit in December since he will still be in [place].  Requested he call back if any questions or concerns.

## 2022-03-04 ENCOUNTER — Ambulatory Visit: Payer: Medicare PPO | Attending: Cardiology | Admitting: Cardiology

## 2022-03-04 ENCOUNTER — Encounter: Payer: Self-pay | Admitting: Cardiology

## 2022-03-04 VITALS — BP 143/89 | HR 65 | Ht 73.0 in | Wt 230.0 lb

## 2022-03-04 DIAGNOSIS — Z951 Presence of aortocoronary bypass graft: Secondary | ICD-10-CM

## 2022-03-04 DIAGNOSIS — I2510 Atherosclerotic heart disease of native coronary artery without angina pectoris: Secondary | ICD-10-CM

## 2022-03-04 NOTE — Patient Instructions (Signed)
Medication Instructions:  The current medical regimen is effective;  continue present plan and medications.  *If you need a refill on your cardiac medications before your next appointment, please call your pharmacy*  Follow-Up: At [facility], you and your health needs are our priority.  As part of our continuing mission to provide you with exceptional heart care, we have created designated Provider Care Teams.  These Care Teams include your primary Cardiologist (physician) and Advanced Practice Providers (APPs -  Physician Assistants and Nurse Practitioners) who all work together to provide you with the care you need, when you need it.  We recommend signing up for the patient portal called "MyChart".  Sign up information is provided on this After Visit Summary.  MyChart is used to connect with patients for Virtual Visits (Telemedicine).  Patients are able to view lab/test results, encounter notes, upcoming appointments, etc.  Non-urgent messages can be sent to your provider as well.   To learn more about what you can do with MyChart, go to [email or web address].    Your next appointment:   6 month(s)  The format for your next appointment:   In Person  Provider:   Mark Skains, MD      Important Information About Sugar       
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
## 2022-04-08 ENCOUNTER — Other Ambulatory Visit: Payer: Self-pay | Admitting: Urology

## 2022-04-08 DIAGNOSIS — C61 Malignant neoplasm of prostate: Secondary | ICD-10-CM

## 2022-04-15 ENCOUNTER — Encounter: Payer: Self-pay | Admitting: Podiatry

## 2022-04-21 ENCOUNTER — Ambulatory Visit: Payer: Medicare PPO

## 2022-04-21 ENCOUNTER — Ambulatory Visit: Payer: Medicare PPO | Admitting: Podiatry

## 2022-04-21 ENCOUNTER — Ambulatory Visit (INDEPENDENT_AMBULATORY_CARE_PROVIDER_SITE_OTHER): Payer: Medicare PPO

## 2022-04-21 DIAGNOSIS — L97514 Non-pressure chronic ulcer of other part of right foot with necrosis of bone: Secondary | ICD-10-CM

## 2022-04-21 DIAGNOSIS — L97522 Non-pressure chronic ulcer of other part of left foot with fat layer exposed: Secondary | ICD-10-CM

## 2022-04-21 DIAGNOSIS — M86172 Other acute osteomyelitis, left ankle and foot: Secondary | ICD-10-CM

## 2022-04-22 ENCOUNTER — Encounter: Payer: Self-pay | Admitting: Podiatry

## 2022-04-22 MED ORDER — DOXYCYCLINE HYCLATE 100 MG PO TABS: 100.0000 mg | ORAL_TABLET | Freq: Two times a day (BID) | ORAL | 0 refills | Status: DC

## 2022-04-22 NOTE — Progress Notes (Signed)
  Subjective:  Patient ID: Aaron [name], male    DOB: Aug 01, 1950,  MRN: [id number]  Chief Complaint  Patient presents with   Foot Ulcer      72 y.o. male returns for post-op check.  He returns for follow-up, he is seeing a podiatrist and wound care specialist in [place], the left foot ulcer has acutely worsened and they have recommended MRI suspicious for osteomyelitis.  He is here today for second opinion and continued management, he is still frustrated with the progress of the right foot deformity Review of Systems: Negative except as noted in the HPI. Denies N/V/F/Ch.   Objective:  There were no vitals filed for this visit. There is no height or weight on file to calculate BMI. Constitutional Well developed. Well nourished.  Vascular Foot warm and well perfused. Capillary refill normal to all digits.  Calf is soft and supple, no posterior calf or knee pain, negative Homans' sign  Neurologic Normal speech. Oriented to person, place, and time. Epicritic sensation to light touch grossly absent on the right foot  Dermatologic Small ulceration plantar right midfoot, no signs of infection here. Left foot submetatarsal 4 shows ulcer with purulent drainage, erythema surrounding this overlying hyperkeratosis probes directly to bone  Orthopedic: Tenderness on the fourth metatarsal ulcer left foot, hammertoe contractures, previous left fifth metatarsal head resection, significant valgus deformity of right foot with hallux valgus and digital contractures   New radiographs taken today show osteolysis of the fourth metatarsal head  Pathology results show osteomyelitis   Assessment:   1. Ulcer of left foot with fat layer exposed ([place])   2. 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 Acute osteomyelitis of left foot ([place])    Plan:  Patient was evaluated and treated and all questions answered.  Chronic right foot deformities and ulcer, left foot osteomyelitis -Reviewed his  radiographs with him, discussed the presence of the osteolysis and I am suspicious for this being osteomyelitis.  The wound probes directly to bone.  I do think he will end up needing a fourth metatarsal head resection possible base resection of the proximal phalanx of the toe.  Unfortunately may lead to a transfer lesion.  I recommend an MRI to further evaluate this.  He is returning to [place] tomorrow and we will try to get this completed at a facility local to him.   No follow-ups on file.

## 2022-04-27 ENCOUNTER — Encounter: Payer: Self-pay | Admitting: Podiatry

## 2022-05-05 MED ORDER — DOXYCYCLINE HYCLATE 100 MG PO TABS: 100.0000 mg | ORAL_TABLET | Freq: Two times a day (BID) | ORAL | 0 refills | Status: DC

## 2022-05-05 MED ORDER — CIPROFLOXACIN HCL 500 MG PO TABS: 500.0000 mg | ORAL_TABLET | Freq: Two times a day (BID) | ORAL | 0 refills | Status: AC

## 2022-05-06 ENCOUNTER — Encounter: Payer: Self-pay | Admitting: Podiatry

## 2022-05-06 NOTE — Telephone Encounter (Signed)
Spoke with patient and discussed the foot which is getting worse, cellulitic. He would prefer to come be treated at [facility] which I told him we are happy to do, but he should seek prompt treatment ASAP, he is frustrated with the local health system. If he presents to [facility] we will be consulted

## 2022-05-08 ENCOUNTER — Encounter: Payer: Self-pay | Admitting: Podiatry

## 2022-05-10 ENCOUNTER — Other Ambulatory Visit: Payer: Medicare PPO

## 2022-05-19 ENCOUNTER — Other Ambulatory Visit: Payer: Self-pay | Admitting: Cardiology

## 2022-05-27 ENCOUNTER — Other Ambulatory Visit: Payer: Self-pay | Admitting: Cardiology

## 2022-08-17 ENCOUNTER — Encounter: Payer: Self-pay | Admitting: *Deleted

## 2022-08-17 ENCOUNTER — Telehealth: Payer: Self-pay | Admitting: Cardiology

## 2022-08-17 DIAGNOSIS — Z951 Presence of aortocoronary bypass graft: Secondary | ICD-10-CM

## 2022-08-17 DIAGNOSIS — I2510 Atherosclerotic heart disease of native coronary artery without angina pectoris: Secondary | ICD-10-CM

## 2022-08-17 NOTE — Telephone Encounter (Signed)
Returned call and spoke with patient. He states he is looking to buy an airplane and would like to have a stress test done before he makes this purchase to be sure his heart is in good condition to fly a plane.  Informed patient he may need other tests done to determine this. Will forward to Dr. Anne Fu and his nurse to review and advise.

## 2022-08-17 NOTE — Telephone Encounter (Signed)
Returned call to patient and reviewed instructions for tomorrow's Lexiscan.  Patient verbalized understanding and expressed appreciation for call.  Will also send copy of instructions to patient via MyChart message.

## 2022-08-17 NOTE — Telephone Encounter (Signed)
Patient is calling to go over instructions for his test that is scheduled for tomorrow morning.

## 2022-08-17 NOTE — Telephone Encounter (Signed)
Instructions sent to pt via MyChart.  He is scheduled for 08/18/22.  Dr Anne Fu aware to sign attestation.

## 2022-08-17 NOTE — Telephone Encounter (Signed)
Patient stated he wants to get urgent orders for a stress test prior to him purchasing an airplane.  Patient wanted Dr. Anne Fu to know he will need to complete this test as soon as possible.

## 2022-08-17 NOTE — Telephone Encounter (Signed)
Reviewed with Dr Anne Fu who gives verbal order for [facility].  Order placed and pt will be contacted to be scheduled.

## 2022-08-18 ENCOUNTER — Ambulatory Visit (HOSPITAL_COMMUNITY): Payer: Medicare PPO | Attending: Cardiology

## 2022-08-18 DIAGNOSIS — I2510 Atherosclerotic heart disease of native coronary artery without angina pectoris: Secondary | ICD-10-CM | POA: Insufficient documentation

## 2022-08-18 DIAGNOSIS — Z951 Presence of aortocoronary bypass graft: Secondary | ICD-10-CM | POA: Insufficient documentation

## 2022-08-18 LAB — MYOCARDIAL PERFUSION IMAGING
Estimated workload: 1
Exercise duration (min): 1 min
Exercise duration (sec): 0 s
LV dias vol: 103 mL (ref 62–150)
LV sys vol: 51 mL
MPHR: 147 {beats}/min
Nuc Stress EF: 50 %
Peak HR: 68 {beats}/min
Percent HR: 46 %
Rest HR: 54 {beats}/min
Rest Nuclear Isotope Dose: 10.5 mCi
SDS: 2
SRS: 2
SSS: 4
ST Depression (mm): 0 mm
Stress Nuclear Isotope Dose: 31.1 mCi
TID: 0.98

## 2022-08-18 MED ORDER — TECHNETIUM TC 99M TETROFOSMIN IV KIT
10.5000 | PACK | Freq: Once | INTRAVENOUS | Status: AC | PRN
  Administered 2022-08-18: 10.5 via INTRAVENOUS

## 2022-08-18 MED ORDER — REGADENOSON 0.4 MG/5ML IV SOLN
0.4000 mg | Freq: Once | INTRAVENOUS | Status: AC
  Administered 2022-08-18: 0.4 mg via INTRAVENOUS

## 2022-08-18 MED ORDER — TECHNETIUM TC 99M TETROFOSMIN IV KIT
31.1000 | PACK | Freq: Once | INTRAVENOUS | Status: AC | PRN
  Administered 2022-08-18: 31.1 via INTRAVENOUS

## 2022-08-19 ENCOUNTER — Telehealth: Payer: Self-pay | Admitting: Cardiology

## 2022-08-19 ENCOUNTER — Encounter (HOSPITAL_COMMUNITY): Payer: Medicare PPO

## 2022-08-19 NOTE — Telephone Encounter (Signed)
Patient stated he would like a call back from Dr. Anne Fu regarding test results which he read on MyChart and has made him anxious.

## 2022-08-19 NOTE — Telephone Encounter (Signed)
Spoke with the patient and advised that Dr. Anne Fu has not reviewed his stress test results yet but we will give him a call once he has.

## 2022-08-24 NOTE — Telephone Encounter (Signed)
Pt is aware of results and Anne Fu' comments.  He was appreciative of the call and information.

## 2022-08-24 NOTE — Telephone Encounter (Signed)
Attempted to contact pt to review results.  Started leaving a message on voicemail when the phone call was disconnected.  Will attempt to contact him again. Per Dr Anne Fu - pt's cardiac status is stable and he is OK to move forward with flight physical.

## 2022-10-20 ENCOUNTER — Telehealth: Payer: Self-pay | Admitting: Cardiology

## 2022-10-20 NOTE — Telephone Encounter (Signed)
Patient is requesting a call back in regards to his appointment tomorrow.

## 2022-10-20 NOTE — Telephone Encounter (Signed)
Pt states he is in [place] and will not be able to make appt in person and wondering if it can be switched to a mychart video visit instead.   Aware provider is not in the office today to make that determination. Aware I will forward to him and his assist to address first thing in the morning. Aware someone will call him in the morning to let him know. Patient verbalized understanding and agreeable to plan.

## 2022-10-21 ENCOUNTER — Ambulatory Visit: Payer: Medicare PPO | Admitting: Cardiology

## 2022-10-21 NOTE — Telephone Encounter (Signed)
Spoke with the patient and advised that he will need an in-person visit. Patient is wondering why he needs the appointment since his stress test results already came back and were normal. Advised that it was a 6 month follow up appointment. Patient states that he is okay to wait until this fall and see Dr. Anne Fu. Patient's appointment has been rescheduled.

## 2022-11-18 ENCOUNTER — Ambulatory Visit (INDEPENDENT_AMBULATORY_CARE_PROVIDER_SITE_OTHER): Payer: Medicare PPO

## 2022-11-18 ENCOUNTER — Ambulatory Visit: Payer: Medicare PPO | Admitting: Podiatry

## 2022-11-18 ENCOUNTER — Encounter: Payer: Self-pay | Admitting: Podiatry

## 2022-11-18 DIAGNOSIS — L97514 Non-pressure chronic ulcer of other part of right foot with necrosis of bone: Secondary | ICD-10-CM

## 2022-11-18 DIAGNOSIS — C61 Malignant neoplasm of prostate: Secondary | ICD-10-CM | POA: Insufficient documentation

## 2022-11-18 DIAGNOSIS — M14679 Charcot's joint, unspecified ankle and foot: Secondary | ICD-10-CM | POA: Diagnosis not present

## 2022-11-18 NOTE — Progress Notes (Signed)
Subjective:  Patient ID: Aaron [name], male    DOB: [birth year],  MRN: [id number]  Chief Complaint  Patient presents with   Foot Pain    Patient is here for right foot pain      72 y.o. male returns for post-op check.  He returns for follow-up, the left foot is doing better, recent toe amputation has healed from his surgery at the [place] in [place].  Recently had a visit at [facility] orthopedics for another opinion on his right foot chronic deformity and reconstruction, Dr. Eilleen Kempf has also recommended evaluating with lab work and x-rays.  Review of Systems: Negative except as noted in the HPI. Denies N/V/F/Ch.   Objective:  There were no vitals filed for this visit. There is no height or weight on file to calculate BMI. Constitutional Well developed. Well nourished.  Vascular Foot warm and well perfused. Capillary refill normal to all digits.  Calf is soft and supple, no posterior calf or knee pain, negative Homans' sign  Neurologic Normal speech. Oriented to person, place, and time. Epicritic sensation to light touch grossly absent on the right foot  Dermatologic Full-thickness ulceration on plantar right midfoot measures approximately 1.5 cm in diameter with exposed subcutaneous tissue, large surrounding hyperkeratosis, no acute active signs of infection or exposed bone tendon or joint  Orthopedic: Chronic right foot rocker-bottom foot deformity, dislocated second MTPJ with hammertoe deformities   New radiographs taken today of the right foot show relatively unchanged alignment with custom 3D parented titanium implant present, previous screw fracture sites are unchanged  Assessment:   1. 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 Charcot's joint of foot, unspecified laterality    Plan:  Patient was evaluated and treated and all questions answered.  Chronic right foot deformities and ulcer, Today we discussed his chronic right foot deformity from previous attempted  Charcot reconstruction.  He had a recent visit with [facility] where they discussed his prognosis for this, their concern was that at some point this could become acutely infected, they discussed the option of reconstructive revision with them which likely would be quite extensive require multiple staged surgeries and external and internal fixation and revisions and may still carry high risk of infection and/or amputation.  They also discussed the option of below-knee amputation.  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  Currently his wound is stable and does not show any signs of acute infection and he is on suppressive antibiotics.  There is no change in alignment or appearance of the foot itself and his radiographs today.  I did order lab work for him including CBC, ESR, CRP and a complete metabolic panel for surveillance of any inflammatory markers that may indicate any chronic or recurrent infection.  I discussed with him if it is not acutely painful or threatening infection I do not see a reason to proceed with reconstruction or amputation at this point considering his goals and lifestyle.  I do think it is reasonable for him to proceed with low impact exercise and activity such as an elliptical or recumbent bike.  He is primarily helping to take care of his wife after her severe accident and he would like to continue flying.  I debrided the wound of overlying hyperkeratosis and nonviable tissue and a bandage was applied  with Iodosorb.  He will return to see me on an as-needed basis for this.  I will let him know the results of his lab work and we can CC Dr. Eilleen Kempf on this as well.  Return if symptoms worsen or fail to improve.

## 2022-12-24 ENCOUNTER — Encounter: Payer: Self-pay | Admitting: Podiatry

## 2022-12-24 ENCOUNTER — Encounter: Payer: Self-pay | Admitting: Cardiology

## 2022-12-29 ENCOUNTER — Telehealth: Payer: Self-pay | Admitting: General Practice

## 2022-12-29 NOTE — Telephone Encounter (Signed)
Good Morning, I have Aaron [name]  [birth year] really wanting to be your pt he stated he will be the best pt ever and he heard so many great things about you.

## 2022-12-30 ENCOUNTER — Telehealth: Payer: Self-pay | Admitting: Cardiology

## 2022-12-30 NOTE — Telephone Encounter (Signed)
I will accept him.   Please schedule in an 11 am slot

## 2022-12-30 NOTE — Telephone Encounter (Signed)
Pt is aware his appt 10/4 is a MyChart video visit.  He has consented and was sent instructions via MyChart.

## 2022-12-30 NOTE — Telephone Encounter (Signed)
Appointment scheduled.

## 2022-12-30 NOTE — Telephone Encounter (Signed)
Pt trying to speak with the nurse about tomorrow's appt. He wants to know if it is going to be virtual. Please advise

## 2022-12-31 ENCOUNTER — Telehealth: Payer: Self-pay | Admitting: Pharmacist

## 2022-12-31 ENCOUNTER — Encounter: Payer: Self-pay | Admitting: Cardiology

## 2022-12-31 ENCOUNTER — Ambulatory Visit: Payer: Medicare PPO | Attending: Cardiology | Admitting: Cardiology

## 2022-12-31 ENCOUNTER — Other Ambulatory Visit (HOSPITAL_COMMUNITY): Payer: Self-pay

## 2022-12-31 VITALS — BP 154/94 | HR 70 | Ht 73.0 in | Wt 230.0 lb

## 2022-12-31 DIAGNOSIS — Z79899 Other long term (current) drug therapy: Secondary | ICD-10-CM | POA: Diagnosis not present

## 2022-12-31 DIAGNOSIS — I2510 Atherosclerotic heart disease of native coronary artery without angina pectoris: Secondary | ICD-10-CM

## 2022-12-31 DIAGNOSIS — I10 Essential (primary) hypertension: Secondary | ICD-10-CM | POA: Diagnosis not present

## 2022-12-31 DIAGNOSIS — Z951 Presence of aortocoronary bypass graft: Secondary | ICD-10-CM

## 2022-12-31 MED ORDER — IRBESARTAN 150 MG PO TABS: 150.0000 mg | ORAL_TABLET | Freq: Every day | ORAL | 3 refills | Status: DC

## 2022-12-31 MED ORDER — WEGOVY 0.25 MG/0.5ML ~~LOC~~ SOAJ
0.2500 mg | SUBCUTANEOUS | 0 refills | Status: DC
  Filled 2023-01-10: qty 2, 28d supply, fill #0

## 2022-12-31 NOTE — Progress Notes (Signed)
Virtual Visit via Video Note   Because of Tres B Funk's co-morbid illnesses, he is at least at moderate risk for complications without adequate follow up.  This format is felt to be most appropriate for this patient at this time.  All issues noted in this document were discussed and addressed.  A limited physical exam was performed with this format.  Please refer to the patient's chart for his consent to telehealth for [facility].       Date:  12/31/2022   ID:  Aaron [name], DOB 11/16/1950, MRN [id number] The patient was identified using 2 identifiers.  Patient Location: Home Provider Location: Office/Clinic   PCP:  Patient, No Pcp Per    [facility] Providers Cardiologist:  Aaron Schultz, MD     Evaluation Performed:  Follow-Up Visit  Chief Complaint: Follow-up coronary artery disease  History of Present Illness:    Aaron [name] is a 72 y.o. male with prior bypass surgery, gastric bypass surgery, osteomyelitis being seen for follow-up visit.  Taking care of his wife as primary caregiver given her accident.  Quadriplegic.  Has a house in [place].  Moving back to [place].  Brother died of a heart attack a day after seeing his primary care doctor.  He is a retired Buyer, retail, Pensions consultant.  Overall he is not having any anginal symptoms.  No shortness of breath.  He has gained back some weight and is interested in weight loss medication such as [id number].   Past Medical History:  Diagnosis Date   Coronary artery disease    GERD with apnea    Hyperlipidemia    Hypertension    Hypertrophy of prostate    Obesity    OSA on CPAP    Sleep apnea    uses CPAP   Past Surgical History:  Procedure Laterality Date   COLONOSCOPY  2016   [place], [place] =polyps   CORONARY ARTERY BYPASS GRAFT  2009   FOOT SURGERY     multiple   TOTAL HIP ARTHROPLASTY       Current Meds  Medication Sig   ascorbic acid (VITAMIN C) 1000 MG tablet ascorbic acid  (vitamin C) 1,000 mg tablet  TAKE 1 TABLET BY MOUTH TWICE A DAY   aspirin EC 81 MG tablet Take 1 tablet (81 mg total) by mouth daily. Swallow whole.   atorvastatin (LIPITOR) 80 MG tablet TAKE 1 TABLET BY MOUTH EVERY DAY   b complex vitamins tablet Take 1 tablet by mouth daily.   Cholecalciferol 125 MCG (5000 UT) TABS Vitamin D3 125 mcg (5,000 unit) tablet  TAKE 1 TABLET (5,000 UNITS TOTAL) BY MOUTH ONCE DAILY   Cobalamin Combinations (VITAMIN B12-FOLIC ACID) 500-400 MCG TABS 1 tablet once daily   Coenzyme Q10 (COQ-10 PO) Take 1 tablet by mouth daily.   doxycycline (VIBRA-TABS) 100 MG tablet Take 1 tablet (100 mg total) by mouth 2 (two) times daily.   irbesartan (AVAPRO) 150 MG tablet Take 1 tablet (150 mg total) by mouth daily.   nebivolol (BYSTOLIC) 10 MG tablet TAKE 1 TABLET BY MOUTH EVERY DAY   potassium chloride (K-DUR) 10 MEQ tablet Take 1 tablet by mouth daily.   tamsulosin (FLOMAX) 0.4 MG CAPS capsule tamsulosin 0.4 mg capsule  TAKE 1 CAPSULE BY MOUTH EVERY DAY   vitamin B-12 (CYANOCOBALAMIN) 500 MCG tablet Take 500 mcg by mouth daily.   Current Facility-Administered Medications for the 12/31/22 encounter (Video Visit) with Jake Bathe, MD  Medication   0.9 %  sodium chloride infusion     Allergies:   Metoprolol   Social History   Tobacco Use   Smoking status: Former   Smokeless tobacco: Never  Vaping Use   Vaping status: Never Used  Substance Use Topics   Alcohol use: No   Drug use: No     Family Hx: The patient's family history includes Hypertension in his father. There is no history of Colon cancer, Esophageal cancer, Stomach cancer, or Rectal cancer.  ROS:   Please see the history of present illness.    No chest pain no shortness of breath no fevers no chills All other systems reviewed and are negative.   Prior CV studies:   The following studies were reviewed today:  Nuclear stress test 2021-low risk no ischemia  Labs/Other Tests and Data Reviewed:      Recent Labs: No results found for requested labs within last 365 days.   Recent Lipid Panel Lab Results  Component Value Date/Time   CHOL 163 09/06/2019 04:42 PM   TRIG 119 09/06/2019 04:42 PM   HDL 71 09/06/2019 04:42 PM   CHOLHDL 2.3 09/06/2019 04:42 PM   LDLCALC 71 09/06/2019 04:42 PM    Wt Readings from Last 3 Encounters:  12/31/22 230 lb (104.3 kg)  08/18/22 230 lb (104.3 kg)  03/04/22 230 lb (104.3 kg)     Risk Assessment/Calculations:         Objective:    Vital Signs:  BP (!) 154/94   Pulse 70   Ht 6\' 1"  (1.854 m)   Wt 230 lb (104.3 kg)   SpO2 98%   BMI 30.34 kg/m    On visual exam, normal respiratory effort, alert and oriented x 3 in no acute distress, pleasant  ASSESSMENT & PLAN:    Coronary artery disease status post CABG - Doing well without any anginal symptoms continue with goal-directed medical therapy.  Most recent stress test was reassuring overall low risk  Worsening blood pressure - We will start irbesartan 150 mg once a day.  Check basic metabolic profile in 2 weeks.  Ensure proper kidney function as well as sodium.  He is worried about stroke.  Weight loss will help with this as well. -Continue with nebivolol or Bystolic 10 mg once a day  Weight gain - I will talk to my pharmacy team about [facility].  Hopefully with his underlying coronary artery disease he is a candidate.  They will reach out to him.  Osteomyelitis - Wears orthotics has seen physicians at [facility].  Hyperlipidemia - Continue with atorvastatin 80 mg LDL previously 70.             Time:   Today, I have spent 21 minutes with the patient with telehealth technology discussing the above problems.     Medication Adjustments/Labs and Tests Ordered: Current medicines are reviewed at length with the patient today.  Concerns regarding medicines are outlined above.   Tests Ordered: Orders Placed This Encounter  Procedures   Basic metabolic panel    Medication  Changes: Meds ordered this encounter  Medications   irbesartan (AVAPRO) 150 MG tablet    Sig: Take 1 tablet (150 mg total) by mouth daily.    Dispense:  90 tablet    Refill:  3    Follow Up:  In Person  1 year.  He will be coming back in soon for lab work  Signed, Aaron Schultz, MD  12/31/2022 12:26 PM  [facility]

## 2022-12-31 NOTE — Telephone Encounter (Signed)
PA approved through 03/28/24. Pharm tech was able to run test claim, anticipated copay $50.  Left message to discuss with pt over the phone (lives in [place]).

## 2022-12-31 NOTE — Telephone Encounter (Signed)
Received message from Dr Anne Fu that pt is interested in trying [facility] for weight loss. Has Medicare Part D, no DM, does have CAD s/p CABG. Will submit PA under CV and weight loss indications. Key [id number].  Will call pt to discuss once coverage determination is made.

## 2022-12-31 NOTE — Telephone Encounter (Signed)
Spoke with pt, he is s/p bariatric surgery, went from 350 lbs down to 240 lbs, wants to lose another 50 lbs. Trouble exercising because has a bad foot. Cares for his wife who is quadriplegic. Currently in [place], going back down to [facility] November while trying to sell home. Reviewed Wegovy injection technique, side effects, clinical benefits, storage, and dose titration over the phone. Rx sent in for 0.25mg  weekly. Will call pt monthly for dose titrations and tolerability updates. He was very appreciative for the call.

## 2022-12-31 NOTE — Patient Instructions (Addendum)
Medication Instructions:  Please start Irbesartan 150 mg daily.  This has been sent into your local pharmacy. Continue all other medications as listed.  *If you need a refill on your cardiac medications before your next appointment, please call your pharmacy*  Lab Work: Please have blood work in 2 weeks (BMP)  No need to be fasting for this.  Lab is open from 7:30 am to 12:30 pm and 1:30 pm to 4:30 pm.  If you have labs (blood work) drawn today and your tests are completely normal, you will receive your results only by: MyChart Message (if you have MyChart) OR A paper copy in the mail If you have any lab test that is abnormal or we need to change your treatment, we will call you to review the results.  Follow-Up: At [facility], you and your health needs are our priority.  As part of our continuing mission to provide you with exceptional heart care, we have created designated Provider Care Teams.  These Care Teams include your primary Cardiologist (physician) and Advanced Practice Providers (APPs -  Physician Assistants and Nurse Practitioners) who all work together to provide you with the care you need, when you need it.  We recommend signing up for the patient portal called "MyChart".  Sign up information is provided on this After Visit Summary.  MyChart is used to connect with patients for Virtual Visits (Telemedicine).  Patients are able to view lab/test results, encounter notes, upcoming appointments, etc.  Non-urgent messages can be sent to your provider as well.   To learn more about what you can do with MyChart, go to [email or web address].    Your next appointment:   1 year(s)  Provider:   Donato Schultz, MD

## 2023-01-10 ENCOUNTER — Other Ambulatory Visit (HOSPITAL_COMMUNITY): Payer: Self-pay

## 2023-01-12 ENCOUNTER — Other Ambulatory Visit (HOSPITAL_COMMUNITY): Payer: Self-pay

## 2023-01-13 ENCOUNTER — Other Ambulatory Visit (HOSPITAL_COMMUNITY): Payer: Self-pay

## 2023-01-13 ENCOUNTER — Telehealth: Payer: Self-pay | Admitting: Cardiology

## 2023-01-13 NOTE — Telephone Encounter (Signed)
Pt c/o medication issue:  1. Name of Medication:   WEGOVY 0.25 MG/0.5ML SOAJ   2. How are you currently taking this medication (dosage and times per day)?   Not started yet  3. Are you having a reaction (difficulty breathing--STAT)?   4. What is your medication issue?   Patient wants a call back to discuss how long he will need to take this medication.  Patient stated he just picked up medication today.

## 2023-01-13 NOTE — Telephone Encounter (Signed)
Aaron [name] is considered a maintenance med and does have an end date. Spoke with pt, he wasn't sure as med didn't have refills on it. Advised him that dose will be titrated monthly x 5 months until maintenance dose is reached, then refills will be added at that time. He plans to give first dose tonight and was appreciative for the call back.

## 2023-01-14 ENCOUNTER — Ambulatory Visit: Payer: Medicare PPO

## 2023-01-23 ENCOUNTER — Encounter: Payer: Self-pay | Admitting: Internal Medicine

## 2023-01-23 DIAGNOSIS — R739 Hyperglycemia, unspecified: Secondary | ICD-10-CM | POA: Insufficient documentation

## 2023-01-23 NOTE — Patient Instructions (Addendum)
     It was nice to meet you.      Return in about 1 year (around 01/28/2024) for Physical Exam.

## 2023-01-23 NOTE — Progress Notes (Unsigned)
Subjective:    Patient ID: Aaron [name], male    DOB: May 15, 1950, 72 y.o.   MRN: [id number]     HPI Aaron [name] is here for follow up of his chronic medical problems.  He is here to establish with a new pcp.     Cervical radic - right side of neck   Open sore on right foot - has neuropathy - not healing - on chrnic abx -- has prosthetic in foot - related to charcot foot and infection -- [facility]  Left foot - also got infected - toe amputated.    Neuropathy --- inherited.   H/o prostate can - has large BPH - uro at [facility].     S/p bariatric surergy - bypass   Medications and allergies reviewed with patient and updated if appropriate.  Current Outpatient Medications on File Prior to Visit  Medication Sig Dispense Refill   ascorbic acid (VITAMIN C) 1000 MG tablet ascorbic acid (vitamin C) 1,000 mg tablet  TAKE 1 TABLET BY MOUTH TWICE A DAY     aspirin EC 81 MG tablet Take 1 tablet (81 mg total) by mouth daily. Swallow whole. 90 tablet 3   atorvastatin (LIPITOR) 80 MG tablet TAKE 1 TABLET BY MOUTH EVERY DAY 90 tablet 3   b complex vitamins tablet Take 1 tablet by mouth daily.     Cholecalciferol 125 MCG (5000 UT) TABS Vitamin D3 125 mcg (5,000 unit) tablet  TAKE 1 TABLET (5,000 UNITS TOTAL) BY MOUTH ONCE DAILY     Cobalamin Combinations (VITAMIN B12-FOLIC ACID) 500-400 MCG TABS 1 tablet once daily     Coenzyme Q10 (COQ-10 PO) Take 1 tablet by mouth daily.     irbesartan (AVAPRO) 150 MG tablet Take 1 tablet (150 mg total) by mouth daily. 90 tablet 3   nebivolol (BYSTOLIC) 10 MG tablet TAKE 1 TABLET BY MOUTH EVERY DAY 90 tablet 3   potassium chloride (K-DUR) 10 MEQ tablet Take 1 tablet by mouth daily.     tamsulosin (FLOMAX) 0.4 MG CAPS capsule tamsulosin 0.4 mg capsule  TAKE 1 CAPSULE BY MOUTH EVERY DAY     vitamin B-12 (CYANOCOBALAMIN) 500 MCG tablet Take 500 mcg by mouth daily.     WEGOVY 0.25 MG/0.5ML SOAJ Inject 0.25 mg into the skin once a week. 2 mL 0   No  current facility-administered medications on file prior to visit.     Review of Systems  Constitutional:  Positive for fatigue. Negative for fever.  Respiratory:  Negative for cough, shortness of breath and wheezing.   Cardiovascular:  Negative for chest pain, palpitations and leg swelling.  Gastrointestinal:  Positive for abdominal pain (occ - tightness across abdomen). Negative for constipation and diarrhea.  Neurological:  Negative for dizziness, light-headedness and headaches.       Objective:   Vitals:   01/28/23 1059  BP: (!) 144/80  Pulse: 74  Temp: 98.4 F (36.9 C)  SpO2: 97%   BP Readings from Last 3 Encounters:  01/28/23 (!) 144/80  12/31/22 (!) 154/94  03/04/22 (!) 143/89   Wt Readings from Last 3 Encounters:  01/28/23 249 lb (112.9 kg)  12/31/22 230 lb (104.3 kg)  08/18/22 230 lb (104.3 kg)   Body mass index is 32.85 kg/m.    Physical Exam Constitutional:      General: He is not in acute distress.    Appearance: Normal appearance. He is not ill-appearing.  HENT:     Head:  Normocephalic and atraumatic.  Eyes:     Conjunctiva/sclera: Conjunctivae normal.  Cardiovascular:     Rate and Rhythm: Normal rate and regular rhythm.     Heart sounds: Normal heart sounds.  Pulmonary:     Effort: Pulmonary effort is normal. No respiratory distress.     Breath sounds: Normal breath sounds. No wheezing or rales.  Abdominal:     General: There is no distension.     Palpations: Abdomen is soft.     Tenderness: There is no abdominal tenderness.  Musculoskeletal:     Right lower leg: No edema.     Left lower leg: No edema.  Skin:    General: Skin is warm and dry.     Findings: No rash.  Neurological:     Mental Status: He is alert. Mental status is at baseline.  Psychiatric:        Mood and Affect: Mood normal.        Lab Results  Component Value Date   WBC 6.0 09/06/2019   HGB 14.0 09/06/2019   HCT 41.1 09/06/2019   PLT 206 09/06/2019   GLUCOSE 89  09/06/2019   CHOL 163 09/06/2019   TRIG 119 09/06/2019   HDL 71 09/06/2019   LDLCALC 71 09/06/2019   ALT 38 09/06/2019   AST 21 10/02/2007   NA 142 09/06/2019   K 4.3 09/06/2019   CL 106 09/06/2019   CREATININE 1.10 09/06/2019   BUN 22 09/06/2019   CO2 25 09/06/2019   INR 1.2 10/03/2007   HGBA1C  10/02/2007    5.3 (NOTE)   The ADA recommends the following therapeutic goal for glycemic   control related to Hgb A1C measurement:   Goal of Therapy:   < 7.0% Hgb A1C   Reference: American Diabetes Association: Clinical Practice   Recommendations 2008, Diabetes Care,  2008, 31:(Suppl 1).     Assessment & Plan:    See Problem List for Assessment and Plan of chronic medical problems.

## 2023-01-28 ENCOUNTER — Ambulatory Visit: Payer: Medicare PPO | Admitting: Internal Medicine

## 2023-01-28 VITALS — BP 144/80 | HR 74 | Temp 98.4°F | Ht 73.0 in | Wt 249.0 lb

## 2023-01-28 DIAGNOSIS — G4733 Obstructive sleep apnea (adult) (pediatric): Secondary | ICD-10-CM

## 2023-01-28 DIAGNOSIS — I10 Essential (primary) hypertension: Secondary | ICD-10-CM

## 2023-01-28 DIAGNOSIS — R739 Hyperglycemia, unspecified: Secondary | ICD-10-CM

## 2023-01-28 DIAGNOSIS — E7800 Pure hypercholesterolemia, unspecified: Secondary | ICD-10-CM

## 2023-01-28 DIAGNOSIS — I2510 Atherosclerotic heart disease of native coronary artery without angina pectoris: Secondary | ICD-10-CM

## 2023-01-28 DIAGNOSIS — G629 Polyneuropathy, unspecified: Secondary | ICD-10-CM

## 2023-01-28 DIAGNOSIS — S91301S Unspecified open wound, right foot, sequela: Secondary | ICD-10-CM

## 2023-01-28 DIAGNOSIS — C61 Malignant neoplasm of prostate: Secondary | ICD-10-CM

## 2023-01-29 NOTE — Assessment & Plan Note (Signed)
Chronic Likely inherited - has family history

## 2023-01-29 NOTE — Assessment & Plan Note (Addendum)
Chronic BP a little high here today Managed by Dr Anne Fu Continue irbesartan 150 mg daily, bystolic 10 mg daily

## 2023-01-29 NOTE — Assessment & Plan Note (Signed)
Chronic Follows with urology

## 2023-01-29 NOTE — Assessment & Plan Note (Signed)
Chronic Sees cardiology twice a year - Dr Anne Fu No chest pain, sob On ASA 81 mg daily, atorvastatin 80 mg daily

## 2023-01-29 NOTE — Assessment & Plan Note (Signed)
Chronic Management per [facility]

## 2023-01-29 NOTE — Assessment & Plan Note (Signed)
Chronic Continue atorvastatin 80 mg daily Regular exercise and healthy diet encouraged

## 2023-01-31 ENCOUNTER — Encounter: Payer: Self-pay | Admitting: Cardiology

## 2023-01-31 ENCOUNTER — Ambulatory Visit: Payer: Medicare PPO | Admitting: Podiatry

## 2023-01-31 DIAGNOSIS — L97514 Non-pressure chronic ulcer of other part of right foot with necrosis of bone: Secondary | ICD-10-CM

## 2023-02-01 ENCOUNTER — Other Ambulatory Visit (HOSPITAL_COMMUNITY): Payer: Self-pay

## 2023-02-01 MED ORDER — WEGOVY 0.5 MG/0.5ML ~~LOC~~ SOAJ
0.5000 mg | SUBCUTANEOUS | 0 refills | Status: DC
  Filled 2023-02-01: qty 2, 28d supply, fill #0

## 2023-02-01 NOTE — Progress Notes (Signed)
  Subjective:  Patient ID: Aaron [name], male    DOB: [birth year],  MRN: [id number]  No chief complaint on file.     72 y.o. male returns for post-op check.  He returns for follow-up, he is going to have a televisit with Dr. Eilleen Kempf  Review of Systems: Negative except as noted in the HPI. Denies N/V/F/Ch.   Objective:  There were no vitals filed for this visit. There is no height or weight on file to calculate BMI. Constitutional Well developed. Well nourished.  Vascular Foot warm and well perfused. Capillary refill normal to all digits.  Calf is soft and supple, no posterior calf or knee pain, negative Homans' sign  Neurologic Normal speech. Oriented to person, place, and time. Epicritic sensation to light touch grossly absent on the right foot  Dermatologic Full-thickness ulceration on plantar right midfoot measures approximately 1.0 cm in diameter with 1.0 cm depth with exposed subcutaneous tissue, large surrounding hyperkeratosis, no acute active signs of infection or exposed bone tendon or joint  Orthopedic: Chronic right foot rocker-bottom foot deformity, dislocated second MTPJ with hammertoe deformities   Prior radiographs of the right foot show relatively unchanged alignment with custom 3D parented titanium implant present, previous screw fracture sites are unchanged   Assessment:   1. 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  Chronic right foot deformities and ulcer, Wound is continuing to be present and stable there are no active signs of infection.  It was debrided sharply with a scalpel to the subcutaneous layer to remove nonviable tissue and hyperkeratosis.  Postdebridement measurements are noted above.  His right foot deformity is stable and he has an upcoming telehealth visit with his surgeon, I discussed with him that I doubt that there is a way for the wound to heal without reduction  of the metallic prominence from the implant which likely would require complete removal and a new implant to be developed.  Currently he is not ready to proceed with the possibility of failure of such procedure and amputation.  As long as his ulcer is not at risk of impending infection or major disability I think he continue with local wound care and ambulating as tolerated.  Return to see me as needed for further follow-up evaluation and debridement.  No follow-ups on file.

## 2023-02-04 ENCOUNTER — Telehealth: Payer: Self-pay | Admitting: Cardiology

## 2023-02-04 ENCOUNTER — Other Ambulatory Visit: Payer: Self-pay

## 2023-02-04 ENCOUNTER — Other Ambulatory Visit: Payer: Self-pay | Admitting: Cardiology

## 2023-02-04 ENCOUNTER — Other Ambulatory Visit (HOSPITAL_COMMUNITY): Payer: Self-pay

## 2023-02-04 DIAGNOSIS — I2510 Atherosclerotic heart disease of native coronary artery without angina pectoris: Secondary | ICD-10-CM

## 2023-02-04 MED ORDER — WEGOVY 0.5 MG/0.5ML ~~LOC~~ SOAJ
0.5000 mg | SUBCUTANEOUS | 0 refills | Status: DC
  Filled 2023-02-04 (×2): qty 2, 28d supply, fill #0

## 2023-02-04 MED ORDER — WEGOVY 0.25 MG/0.5ML ~~LOC~~ SOAJ
0.2500 mg | SUBCUTANEOUS | 0 refills | Status: DC
  Filled 2023-02-04: qty 2, 28d supply, fill #0

## 2023-02-04 NOTE — Addendum Note (Signed)
Addended by: Cheree Ditto on: 02/04/2023 02:38 PM   Modules accepted: Orders

## 2023-02-04 NOTE — Telephone Encounter (Signed)
Pt c/o medication issue:  1. Name of Medication: Semaglutide-Weight Management (WEGOVY) 0.5 MG/0.5ML SOAJ   2. How are you currently taking this medication (dosage and times per day)?   3. Are you having a reaction (difficulty breathing--STAT)?   4. What is your medication issue? Patient states that he received this medication 2 days ago, but misplaced this and needs an override for pharmacy to get this refilled again. Requesting call back if this is possible.

## 2023-02-04 NOTE — Telephone Encounter (Signed)
That needs to be handled at the pharmacy that dispensed the medication

## 2023-02-04 NOTE — Telephone Encounter (Signed)
I spoke with patient who reports he spoke with the pharmacy and was advised to call our office.  He requests I call the pharmacy.  I spoke with pharmacist and it does not look like insurance will cover lost medication.  Patient could call his insurance and explain situation.  Pharmacist will call patient and make him aware.

## 2023-02-04 NOTE — Telephone Encounter (Signed)
Patient is following up requesting to speak with RN again with an update. He states he found out that the insurance will cover the [facility] .25.

## 2023-02-04 NOTE — Telephone Encounter (Signed)
Patient spoke with insurance and says they will pay for another month of 0.25

## 2023-02-07 ENCOUNTER — Other Ambulatory Visit (HOSPITAL_COMMUNITY): Payer: Self-pay

## 2023-02-08 ENCOUNTER — Other Ambulatory Visit (HOSPITAL_COMMUNITY): Payer: Self-pay

## 2023-02-09 ENCOUNTER — Telehealth: Payer: Self-pay | Admitting: Podiatry

## 2023-02-09 NOTE — Telephone Encounter (Signed)
Returned call to Kaitlin Poorman -- [phone]) -- wanted copies of his 11/18/2022 x-rays of his right foot - there are three (3).  Scanned all three images onto a disc and put in an envelope.  Patient said will pick them up today from the office - front desk.       (J. Abbott -- 02/09/2023)

## 2023-02-14 ENCOUNTER — Telehealth: Payer: Self-pay | Admitting: Cardiology

## 2023-02-14 ENCOUNTER — Other Ambulatory Visit: Payer: Self-pay | Admitting: *Deleted

## 2023-02-14 DIAGNOSIS — R5383 Other fatigue: Secondary | ICD-10-CM

## 2023-02-14 DIAGNOSIS — I2510 Atherosclerotic heart disease of native coronary artery without angina pectoris: Secondary | ICD-10-CM

## 2023-02-14 DIAGNOSIS — Z79899 Other long term (current) drug therapy: Secondary | ICD-10-CM

## 2023-02-14 NOTE — Telephone Encounter (Signed)
  CBC, TSH, Free T4, CMET Donato Schultz, MD    Pt aware of lab orders.  Orders placed and pt will come into office for blood draw today or tomorrow.

## 2023-02-14 NOTE — Telephone Encounter (Signed)
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  Since then he has been having extreme fatigue and has to lay down for 30 to 45 mins before he starts feeling better.   He denies any other s/s - no SOB, edema, pain etc.  He reports before teaching if he felt fatigued he would just go lay down and it would be better.  He is not able to do this of coarse while teaching.  Advised I will review with Dr Anne Fu and call back with orders.

## 2023-02-14 NOTE — Telephone Encounter (Signed)
Patient states for the past 3-4 weeks he has been extremely fatigued + exhausted. He states he does not have any additional symptoms aside from an occasional headache.

## 2023-02-15 ENCOUNTER — Encounter: Payer: Self-pay | Admitting: Internal Medicine

## 2023-02-15 ENCOUNTER — Other Ambulatory Visit: Payer: Self-pay

## 2023-02-15 ENCOUNTER — Ambulatory Visit: Payer: Medicare PPO | Admitting: Internal Medicine

## 2023-02-15 VITALS — BP 114/78 | HR 78 | Temp 98.2°F | Ht 73.0 in | Wt 248.0 lb

## 2023-02-15 DIAGNOSIS — T148XXA Other injury of unspecified body region, initial encounter: Secondary | ICD-10-CM | POA: Diagnosis not present

## 2023-02-15 DIAGNOSIS — S91301S Unspecified open wound, right foot, sequela: Secondary | ICD-10-CM

## 2023-02-15 DIAGNOSIS — L089 Local infection of the skin and subcutaneous tissue, unspecified: Secondary | ICD-10-CM | POA: Insufficient documentation

## 2023-02-15 LAB — COMPREHENSIVE METABOLIC PANEL
ALT: 23 [IU]/L (ref 0–44)
AST: 25 [IU]/L (ref 0–40)
Albumin: 4 g/dL (ref 3.8–4.8)
Alkaline Phosphatase: 76 [IU]/L (ref 44–121)
BUN/Creatinine Ratio: 15 (ref 10–24)
BUN: 17 mg/dL (ref 8–27)
Bilirubin Total: 0.7 mg/dL (ref 0.0–1.2)
CO2: 22 mmol/L (ref 20–29)
Calcium: 9.2 mg/dL (ref 8.6–10.2)
Chloride: 103 mmol/L (ref 96–106)
Creatinine, Ser: 1.17 mg/dL (ref 0.76–1.27)
Globulin, Total: 2.8 g/dL (ref 1.5–4.5)
Glucose: 116 mg/dL — ABNORMAL HIGH (ref 70–99)
Potassium: 4.7 mmol/L (ref 3.5–5.2)
Sodium: 141 mmol/L (ref 134–144)
Total Protein: 6.8 g/dL (ref 6.0–8.5)
eGFR: 65 mL/min/{1.73_m2} (ref >59)

## 2023-02-15 LAB — CBC
Hematocrit: 41.6 % (ref 37.5–51.0)
Hemoglobin: 13.8 g/dL (ref 13.0–17.7)
MCH: 32.1 pg (ref 26.6–33.0)
MCHC: 33.2 g/dL (ref 31.5–35.7)
MCV: 97 fL (ref 79–97)
Platelets: 210 10*3/uL (ref 150–450)
RBC: 4.3 x10E6/uL (ref 4.14–5.80)
RDW: 12.1 % (ref 11.6–15.4)
WBC: 8.9 10*3/uL (ref 3.4–10.8)

## 2023-02-15 LAB — T4, FREE: Free T4: 0.95 ng/dL (ref 0.82–1.77)

## 2023-02-15 LAB — TSH: TSH: 0.603 u[IU]/mL (ref 0.450–4.500)

## 2023-02-15 MED ORDER — DOXYCYCLINE HYCLATE 100 MG PO TABS: 100.0000 mg | ORAL_TABLET | Freq: Two times a day (BID) | ORAL | Status: DC

## 2023-02-15 MED ORDER — AMOXICILLIN-POT CLAVULANATE 875-125 MG PO TABS: 1.0000 | ORAL_TABLET | Freq: Two times a day (BID) | ORAL | 0 refills | Status: AC

## 2023-02-15 NOTE — Patient Instructions (Addendum)
        Medications changes include :   start Augmentin twice daily     Follow up with podiatry and your infectious disease doctor     Return if symptoms worsen or fail to improve.

## 2023-02-15 NOTE — Progress Notes (Signed)
Subjective:    Patient ID: Aaron [name], male    DOB: 1951-02-26, 72 y.o.   MRN: [id number]      HPI Aaron [name] is here for  Chief Complaint  Patient presents with   Fatigue    Fatigue-states extreme fatigue for the past 2-3 weeks.  Has to lay down for 30-45 minutes before he starts to feel better.  No shortness of breath, chest pain, edema.  He did contact his cardiologist who ordered blood work and had that done yesterday-CMP, CBC and TSH all normal.  He was not having any symptoms consistent with a heart problem  Last night his temperature was 100.5.  He did feel feverish and had chills.  He denies any other symptoms of infection-no cold symptoms.  He was concerned about the fatigue and why he had the fever.  Medications and allergies reviewed with patient and updated if appropriate.  Current Outpatient Medications on File Prior to Visit  Medication Sig Dispense Refill   ascorbic acid (VITAMIN C) 1000 MG tablet ascorbic acid (vitamin C) 1,000 mg tablet  TAKE 1 TABLET BY MOUTH TWICE A DAY     aspirin EC 81 MG tablet Take 1 tablet (81 mg total) by mouth daily. Swallow whole. 90 tablet 3   atorvastatin (LIPITOR) 80 MG tablet TAKE 1 TABLET BY MOUTH EVERY DAY 90 tablet 3   b complex vitamins tablet Take 1 tablet by mouth daily.     Cholecalciferol 125 MCG (5000 UT) TABS Vitamin D3 125 mcg (5,000 unit) tablet  TAKE 1 TABLET (5,000 UNITS TOTAL) BY MOUTH ONCE DAILY     Cobalamin Combinations (VITAMIN B12-FOLIC ACID) 500-400 MCG TABS 1 tablet once daily     Coenzyme Q10 (COQ-10 PO) Take 1 tablet by mouth daily.     irbesartan (AVAPRO) 150 MG tablet Take 1 tablet (150 mg total) by mouth daily. 90 tablet 3   nebivolol (BYSTOLIC) 10 MG tablet TAKE 1 TABLET BY MOUTH EVERY DAY 90 tablet 3   potassium chloride (K-DUR) 10 MEQ tablet Take 1 tablet by mouth daily.     Semaglutide-Weight Management (WEGOVY) 0.25 MG/0.5ML SOAJ Inject 0.25 mg into the skin once a week. 2 mL 0    Semaglutide-Weight Management (WEGOVY) 0.5 MG/0.5ML SOAJ Inject 0.5 mg into the skin once a week. 2 mL 0   tamsulosin (FLOMAX) 0.4 MG CAPS capsule tamsulosin 0.4 mg capsule  TAKE 1 CAPSULE BY MOUTH EVERY DAY     vitamin B-12 (CYANOCOBALAMIN) 500 MCG tablet Take 500 mcg by mouth daily.     No current facility-administered medications on file prior to visit.    Review of Systems  Constitutional:  Positive for chills, fatigue and fever (100.5).  HENT:  Positive for rhinorrhea (allergy related). Negative for congestion, ear pain and sore throat.   Respiratory:  Negative for cough, shortness of breath and wheezing.   Cardiovascular:  Negative for chest pain.  Gastrointestinal:  Positive for nausea (occ). Negative for abdominal pain, constipation and diarrhea.  Genitourinary:  Negative for difficulty urinating, dysuria, frequency and hematuria.  Neurological:  Positive for headaches (mild). Negative for light-headedness.       Objective:   Vitals:   02/15/23 1431  BP: 114/78  Pulse: 78  Temp: 98.2 F (36.8 C)  SpO2: 96%   BP Readings from Last 3 Encounters:  02/15/23 114/78  01/28/23 (!) 144/80  12/31/22 (!) 154/94   Wt Readings from Last 3 Encounters:  02/15/23 248 lb (112.5 kg)  01/28/23 249 lb (112.9 kg)  12/31/22 230 lb (104.3 kg)   Body mass index is 32.72 kg/m.    Physical Exam Constitutional:      General: He is not in acute distress.    Appearance: Normal appearance. He is not ill-appearing.  HENT:     Head: Normocephalic.     Right Ear: Tympanic membrane, ear canal and external ear normal. There is no impacted cerumen.     Left Ear: Tympanic membrane, ear canal and external ear normal. There is no impacted cerumen.     Mouth/Throat:     Mouth: Mucous membranes are moist.     Pharynx: No oropharyngeal exudate or posterior oropharyngeal erythema.  Eyes:     Conjunctiva/sclera: Conjunctivae normal.  Cardiovascular:     Rate and Rhythm: Normal rate and regular  rhythm.  Pulmonary:     Effort: Pulmonary effort is normal. No respiratory distress.     Breath sounds: Normal breath sounds. No wheezing or rales.  Musculoskeletal:     Cervical back: Neck supple. No tenderness.     Right lower leg: No edema.     Left lower leg: No edema.  Lymphadenopathy:     Cervical: No cervical adenopathy.  Skin:    General: Skin is warm and dry.     Findings: No rash.     Comments: Chronic wound right plantar surface of foot with increased discharged on Band-Aid from what he says is normal.  He has no pain secondary to chronic nerve damage  Neurological:     Mental Status: He is alert.            Assessment & Plan:    See Problem List for Assessment and Plan of chronic medical problems.

## 2023-02-15 NOTE — Assessment & Plan Note (Signed)
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  He also sees infectious disease Currently on doxycycline 100 mg twice daily for prevention of infection because he has had a recurrent infection here Experiencing increased fatigue for 2-3 weeks for unknown reason Had a fever last night No other source of infection Wound does have increased drainage and an increased smell so concerned that this is the cause of his symptoms Will hold doxycycline for now since he developed the infection while on the medication Start Augmentin 875-125 mg twice daily-will give a 10-day supply Advised that he needs to follow-up with podiatry and infectious disease Culture taken of the wound

## 2023-02-16 ENCOUNTER — Ambulatory Visit: Payer: Medicare PPO | Admitting: Podiatry

## 2023-02-16 ENCOUNTER — Encounter: Payer: Self-pay | Admitting: Podiatry

## 2023-02-16 DIAGNOSIS — L97514 Non-pressure chronic ulcer of other part of right foot with necrosis of bone: Secondary | ICD-10-CM

## 2023-02-18 LAB — WOUND CULTURE

## 2023-02-20 NOTE — Progress Notes (Signed)
  Subjective:  Patient ID: Aaron [name], male    DOB: 1951/03/29,  MRN: [id number]  Chief Complaint  Patient presents with   Wound Check    "The past few days, I had a 105 degree temperature and it's been red.  I've felt icky.  My PCP did a culture and sent it in.  I don't know the results of it."      72 y.o. male returns for post-op check.     Review of Systems: Negative except as noted in the HPI. Denies N/V/F/Ch.   Objective:  There were no vitals filed for this visit. There is no height or weight on file to calculate BMI. Constitutional Well developed. Well nourished.  Vascular Foot warm and well perfused. Capillary refill normal to all digits.  Calf is soft and supple, no posterior calf or knee pain, negative Homans' sign  Neurologic Normal speech. Oriented to person, place, and time. Epicritic sensation to light touch grossly absent on the right foot  Dermatologic Full-thickness ulceration on plantar right midfoot measures approximately 1.0 cm in diameter with 1.0 cm depth with exposed subcutaneous tissue, large surrounding hyperkeratosis, no exposed bone tendon or joint there is some serous drainage and slight malodor  Orthopedic: Chronic right foot rocker-bottom foot deformity, dislocated second MTPJ with hammertoe deformities   Prior radiographs of the right foot show relatively unchanged alignment with custom 3D parented titanium implant present, previous screw fracture sites are unchanged  Culture did not show any significant growth mostly skin flora  Assessment:   1. 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  Chronic right foot deformities and ulcer, He should continue his Augmentin.  The wound was debrided of nonviable tissue to the subcutaneous layer in an excisional manner with a sharp scalpel.  There was slight serous drainage and malodor but no cellulitis purulence or warmth to  the wound.  He should continue to monitor his temperature and symptoms and if these develop present to the emergency room if they occur significant such as fevers chills nausea vomiting.  He will return in 2 weeks for further wound care and reevaluation.  Iodosorb ointment and bandage applied today he will utilize this at home too.  Return in about 2 weeks (around 03/02/2023) for wound care.

## 2023-03-03 ENCOUNTER — Encounter: Payer: Self-pay | Admitting: Podiatry

## 2023-03-03 ENCOUNTER — Ambulatory Visit (INDEPENDENT_AMBULATORY_CARE_PROVIDER_SITE_OTHER): Payer: Medicare PPO

## 2023-03-03 ENCOUNTER — Ambulatory Visit: Payer: Medicare PPO | Admitting: Podiatry

## 2023-03-03 DIAGNOSIS — M86471 Chronic osteomyelitis with draining sinus, right ankle and foot: Secondary | ICD-10-CM | POA: Diagnosis not present

## 2023-03-03 DIAGNOSIS — L97514 Non-pressure chronic ulcer of other part of right foot with necrosis of bone: Secondary | ICD-10-CM

## 2023-03-03 DIAGNOSIS — G629 Polyneuropathy, unspecified: Secondary | ICD-10-CM

## 2023-03-03 DIAGNOSIS — M216X1 Other acquired deformities of right foot: Secondary | ICD-10-CM

## 2023-03-04 ENCOUNTER — Other Ambulatory Visit (HOSPITAL_COMMUNITY): Payer: Self-pay

## 2023-03-04 ENCOUNTER — Telehealth: Payer: Self-pay | Admitting: Pharmacist

## 2023-03-04 ENCOUNTER — Encounter (HOSPITAL_COMMUNITY): Payer: Self-pay

## 2023-03-04 MED ORDER — WEGOVY 0.5 MG/0.5ML ~~LOC~~ SOAJ
0.5000 mg | SUBCUTANEOUS | 0 refills | Status: DC
  Filled 2023-03-04: qty 2, 28d supply, fill #0

## 2023-03-04 NOTE — Telephone Encounter (Signed)
Per pt doing well on Wegovy 0.25mg . Wants to increase to 0.5mg . Rx sent to [facility].

## 2023-03-05 ENCOUNTER — Encounter: Payer: Self-pay | Admitting: Podiatry

## 2023-03-05 NOTE — Progress Notes (Signed)
Subjective:  Patient ID: Aaron [name], male    DOB: Aug 14, 1950,  MRN: [id number]  Chief Complaint  Patient presents with   Routine Post Op    Patient states that his right foot has been ok, no medication for pain , patient just needs a check up on his right foot       72 y.o. male returns for post-op check.   Has improved since last visit. He had a televisit with Dr Eilleen Kempf scheduled but the CD with the xrays from our office [name] blank/inoperable due to software issue on CD. Has remained afebrile since last visit  Review of Systems: Negative except as noted in the HPI. Denies N/V/F/Ch.   Objective:  There were no vitals filed for this visit. There is no height or weight on file to calculate BMI. Constitutional Well developed. Well nourished.  Vascular Foot warm and well perfused. Capillary refill normal to all digits.  Calf is soft and supple, no posterior calf or knee pain, negative Homans' sign  Neurologic Normal speech. Oriented to person, place, and time. Epicritic sensation to light touch grossly absent on the right foot  Dermatologic Full-thickness ulceration on plantar right midfoot measures approximately 1.0 cm in diameter with 1.0 cm depth with exposed subcutaneous tissue, large surrounding hyperkeratosis, no exposed bone tendon or joint, no cellulitis, drainage, malodor today   Orthopedic: Chronic right foot rocker-bottom foot deformity, dislocated second MTPJ with hammertoe deformities   New films taken today show unchanged alignment since last films taken in August, no new hardware failure, distal screw remains broken. Cystic change in proximal metatarsal, possible ? Brodie's abscess            Culture did not show any significant growth mostly skin flora   [facility]08/23/2024 Component 11/19/2022 11/19/2022 11/19/2022 11/19/2022 11/19/2022           Sed Rate - [facility] -- -- -- 3 -- Load older lab results  C Reactive Protein  - [facility] -- -- -- -- <1 Load older lab results  D-Dimer - [facility] 0.23 -- -- -- --   Interleukin-6, Serum - [facility] -- 2.5 -- -- --   Procalcitonin - [facility] -- -- 0.06      Assessment:   1. 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 Midfoot collapse, right   4. Polyneuropathy    Plan:  Patient [name] evaluated and treated and all questions answered.  Chronic right foot deformities and ulcer, New xrays taken today, I gave him a physical copy and sent a copy by MyChart. Will try to email a copy to Dr Eilleen Kempf once I have his contact info. The wound [name] debrided of nonviable tissue to the subcutaneous layer in an excisional manner with a sharp scalpel.  No drainage or acute SOI today.  He should continue to monitor his temperature and symptoms and if these develop present to the emergency room if they occur significant such as fevers chills nausea vomiting.  Iodosorb ointment and bandage applied today he will utilize this at home too. Prior labs in August were reassuring for no acute infection, noted above. 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 Will defer further considerations for recon/revision to him and Dr  Parekh.   No follow-ups on file.

## 2023-03-07 ENCOUNTER — Other Ambulatory Visit (HOSPITAL_COMMUNITY): Payer: Self-pay

## 2023-03-08 ENCOUNTER — Other Ambulatory Visit (HOSPITAL_COMMUNITY): Payer: Self-pay

## 2023-03-08 ENCOUNTER — Encounter: Payer: Self-pay | Admitting: Podiatry

## 2023-03-09 ENCOUNTER — Telehealth: Payer: Self-pay | Admitting: Podiatry

## 2023-03-09 ENCOUNTER — Other Ambulatory Visit (HOSPITAL_COMMUNITY): Payer: Self-pay

## 2023-03-09 NOTE — Telephone Encounter (Signed)
Received call from Aaron [name] ...   Sd he is trying to get copies of the 03/03/2023 x-rays of his right foot.  They were previously faxed to his other doctor (Dr. Vanita Ingles) but his nurse told him they were just black images and the x-rays did not show.  Advised I would copy the six (6) x-rays to a disk and mail the disk to Dr. Eilleen Kempf.  This doctor preformed a special surgery on his foot but the patient is now having issues and Dr. Eilleen Kempf is the only one who to correct it ......  Per Aaron [name] request, sent the disk to:     Dr. Vanita Ingles; [address]; [place], [place]  [address]   .Marland Kitchen...   Called patient @ [phone] & advised same .Marland Kitchen...    J. Abbott -- 03/09/2023

## 2023-03-29 NOTE — Telephone Encounter (Signed)
Closing open encounter

## 2023-04-05 ENCOUNTER — Encounter: Payer: Self-pay | Admitting: Podiatry

## 2023-04-05 ENCOUNTER — Telehealth: Payer: Self-pay | Admitting: Cardiology

## 2023-04-05 ENCOUNTER — Encounter: Payer: Self-pay | Admitting: Internal Medicine

## 2023-04-05 MED ORDER — WEGOVY 1 MG/0.5ML ~~LOC~~ SOAJ: 1.0000 mg | SUBCUTANEOUS | 0 refills | Status: DC

## 2023-04-05 NOTE — Telephone Encounter (Signed)
*  STAT* If patient is at the pharmacy, call can be transferred to refill team.   1. Which medications need to be refilled? (please list name of each medication and dose if known)  Semaglutide -Weight Management (WEGOVY ) 1 MG  2. Which pharmacy/location (including street and city if local pharmacy) is medication to be sent to? [facility] [address], [place], [place] [address]  3. Do they need a 30 day or 90 day supply?   30 day supply

## 2023-05-03 ENCOUNTER — Telehealth: Payer: Self-pay | Admitting: Podiatry

## 2023-05-03 NOTE — Telephone Encounter (Signed)
(  Refer to 03/09/2023 notes) -- The x-rays provided to the patient were grainy and unclear (according to Dr. Lady office).  I re-scanned all the x-rays of patient's right foot from August and December.  Placed on a disc and took to Dr. Silva for his review before sending again to the patient.       J. Abbott -- 05/03/2023

## 2023-05-10 ENCOUNTER — Telehealth: Payer: Self-pay | Admitting: Cardiology

## 2023-05-10 NOTE — Telephone Encounter (Signed)
*  STAT* If patient is at the pharmacy, call can be transferred to refill team.   1. Which medications need to be refilled? (please list name of each medication and dose if known) Semaglutide-Weight Management (WEGOVY) 1 MG/0.5ML SOAJ    4. Which pharmacy/location (including street and city if local pharmacy) is medication to be sent to? [id number] [place] [place] - [address]     5. Do they need a 30 day or 90 day supply? 90

## 2023-05-12 MED ORDER — WEGOVY 1.7 MG/0.75ML ~~LOC~~ SOAJ: 1.7000 mg | SUBCUTANEOUS | 0 refills | Status: DC

## 2023-05-17 ENCOUNTER — Other Ambulatory Visit: Payer: Self-pay | Admitting: Cardiology

## 2023-05-26 ENCOUNTER — Ambulatory Visit: Payer: Medicare PPO

## 2023-05-26 VITALS — Ht 73.0 in | Wt 228.0 lb

## 2023-05-26 DIAGNOSIS — Z0000 Encounter for general adult medical examination without abnormal findings: Secondary | ICD-10-CM | POA: Diagnosis not present

## 2023-05-26 NOTE — Patient Instructions (Addendum)
 Mr. Auletta , Thank you for taking time to come for your Medicare Wellness Visit. I appreciate your ongoing commitment to your health goals. Please review the following plan we discussed and let me know if I can assist you in the future.   Referrals/Orders/Follow-Ups/Clinician Recommendations: It was nice talking to you today.  Aim for 30 minutes of exercise or brisk walking, 6-8 glasses of water, and 5 servings of fruits and vegetables each day.   This is a list of the screening recommended for you and due dates:  Health Maintenance  Topic Date Due   Hepatitis C Screening  Never done   COVID-19 Vaccine (6 - 2024-25 season) 02/22/2023   Medicare Annual Wellness Visit  05/25/2024   Colon Cancer Screening  12/14/2026   DTaP/Tdap/Td vaccine (5 - Td or Tdap) 04/27/2028   Pneumonia Vaccine  Completed   Flu Shot  Completed   Zoster (Shingles) Vaccine  Completed   HPV Vaccine  Aged Out    Advanced directives: (Copy Requested) Please bring a copy of your health care power of attorney and living will to the office to be added to your chart at your convenience.  Next Medicare Annual Wellness Visit scheduled for next year: Yes

## 2023-05-26 NOTE — Progress Notes (Signed)
 Subjective:   Aaron [name] is a 73 y.o. who presents for a Medicare Wellness preventive visit.  Visit Complete: Virtual I connected with  [name] on 05/26/23 by a video and audio enabled telemedicine application and verified that I am speaking with the correct person using two identifiers.  Patient Location: Home  Provider Location: Home Office  I discussed the limitations of evaluation and management by telemedicine. The patient expressed understanding and agreed to proceed.  Vital Signs: Because this visit was a virtual/telehealth visit, some criteria may be missing or patient reported. Any vitals not documented were not able to be obtained and vitals that have been documented are patient reported.   AWV Questionnaire: No: Patient Medicare AWV questionnaire was not completed prior to this visit.  Cardiac Risk Factors include: advanced age (>36men, >27 women);male gender;hypertension;Other (see comment);dyslipidemia, Risk factor comments: OSA, BENIGN PROSTATIC HYPERTROPHY,  Coronary atherosclerosis     Objective:    Today's Vitals   05/26/23 1007  Weight: 228 lb (103.4 kg)  Height: 6\' 1"  (1.854 m)   Body mass index is 30.08 kg/m.     05/26/2023   10:18 AM  Advanced Directives  Does Patient Have a Medical Advance Directive? Yes  Type of Estate agent of [place];Living will  Copy of Healthcare Power of Attorney in Chart? No - copy requested    Current Medications (verified) Outpatient Encounter Medications as of 05/26/2023  Medication Sig   ascorbic acid (VITAMIN C) 1000 MG tablet ascorbic acid (vitamin C) 1,000 mg tablet  TAKE 1 TABLET BY MOUTH TWICE A DAY   aspirin EC 81 MG tablet Take 1 tablet (81 mg total) by mouth daily. Swallow whole.   atorvastatin (LIPITOR) 80 MG tablet TAKE 1 TABLET BY MOUTH EVERY DAY   b complex vitamins tablet Take 1 tablet by mouth daily.   Cholecalciferol 125 MCG (5000 UT) TABS Vitamin D3 125 mcg (5,000  unit) tablet  TAKE 1 TABLET (5,000 UNITS TOTAL) BY MOUTH ONCE DAILY   Cobalamin Combinations (VITAMIN B12-FOLIC ACID) 500-400 MCG TABS 1 tablet once daily   Coenzyme Q10 (COQ-10 PO) Take 1 tablet by mouth daily.   doxycycline (VIBRA-TABS) 100 MG tablet Take 1 tablet (100 mg total) by mouth 2 (two) times daily.   irbesartan (AVAPRO) 150 MG tablet Take 1 tablet (150 mg total) by mouth daily.   nebivolol (BYSTOLIC) 10 MG tablet TAKE 1 TABLET BY MOUTH EVERY DAY   potassium chloride (K-DUR) 10 MEQ tablet Take 1 tablet by mouth daily.   Semaglutide-Weight Management (WEGOVY) 1.7 MG/0.75ML SOAJ Inject 1.7 mg into the skin once a week.   tamsulosin (FLOMAX) 0.4 MG CAPS capsule tamsulosin 0.4 mg capsule  TAKE 1 CAPSULE BY MOUTH EVERY DAY   vitamin B-12 (CYANOCOBALAMIN) 500 MCG tablet Take 500 mcg by mouth daily.   No facility-administered encounter medications on file as of 05/26/2023.    Allergies (verified) Vancomycin and Metoprolol   History: Past Medical History:  Diagnosis Date   Coronary artery disease    GERD with apnea    Hyperlipidemia    Hypertension    Hypertrophy of prostate    Obesity    OSA on CPAP    Sleep apnea    uses CPAP   Past Surgical History:  Procedure Laterality Date   COLONOSCOPY  2016   [place], [place] =polyps   CORONARY ARTERY BYPASS GRAFT  2009   FOOT SURGERY     multiple   TOTAL HIP ARTHROPLASTY  Family History  Problem Relation Age of Onset   Hypertension Father    Colon cancer Neg Hx    Esophageal cancer Neg Hx    Stomach cancer Neg Hx    Rectal cancer Neg Hx    Social History   Socioeconomic History   Marital status: Married    Spouse name: [place]   Number of children: Not on file   Years of education: Not on file   Highest education level: Not on file  Occupational History   Occupation: RETIRED  Tobacco Use   Smoking status: Former   Smokeless tobacco: Never  Advertising account planner   Vaping status: Never Used  Substance and Sexual Activity    Alcohol use: No   Drug use: No   Sexual activity: Not on file  Other Topics Concern   Not on file  Social History Narrative   Lives at home with wife.   Social Drivers of Corporate investment banker Strain: Low Risk  (05/26/2023)   Overall Financial Resource Strain (CARDIA)    Difficulty of Paying Living Expenses: Not hard at all  Food Insecurity: No Food Insecurity (05/26/2023)   Hunger Vital Sign    Worried About Running Out of Food in the Last Year: Never true    Ran Out of Food in the Last Year: Never true  Transportation Needs: No Transportation Needs (05/26/2023)   PRAPARE - Administrator, Civil Service (Medical): No    Lack of Transportation (Non-Medical): No  Physical Activity: Inactive (05/26/2023)   Exercise Vital Sign    Days of Exercise per Week: 0 days    Minutes of Exercise per Session: 0 min  Stress: No Stress Concern Present (05/26/2023)   [employer] of Occupational Health - Occupational Stress Questionnaire    Feeling of Stress : Not at all  Social Connections: Moderately Integrated (05/26/2023)   Social Connection and Isolation Panel [NHANES]    Frequency of Communication with Friends and Family: More than three times a week    Frequency of Social Gatherings with Friends and Family: Never    Attends Religious Services: Never    Database administrator or Organizations: Yes    Attends Engineer, structural: 1 to 4 times per year    Marital Status: Married    Tobacco Counseling Counseling given: Not Answered    Clinical Intake:  Pre-visit preparation completed: Yes  Pain : No/denies pain     BMI - recorded: 30.08 Nutritional Status: BMI > 30  Obese Nutritional Risks: None Diabetes: No     Interpreter Needed?: No  Information entered by :: Rhythm Gubbels, RMA   Activities of Daily Living     05/26/2023   10:30 AM  In your present state of health, do you have any difficulty performing the following activities:   Hearing? 0  Vision? 0  Difficulty concentrating or making decisions? 0  Walking or climbing stairs? 0  Dressing or bathing? 0  Doing errands, shopping? 0  Preparing Food and eating ? N  Using the Toilet? N  In the past six months, have you accidently leaked urine? N  Do you have problems with loss of bowel control? N  Managing your Medications? N  Managing your Finances? N  Housekeeping or managing your Housekeeping? N    Patient Care Team: Pincus Sanes, MD as PCP - General (Internal Medicine) Jake Bathe, MD as PCP - Cardiology (Cardiology)  Indicate any recent Medical Services you may  have received from other than [facility] providers in the past year (date may be approximate).     Assessment:   This is a routine wellness examination for [facility]  Hearing/Vision screen Hearing Screening - Comments:: Denies hearing difficulties   Vision Screening - Comments:: Wears eyeglasses   Goals Addressed               This Visit's Progress     Patient Stated (pt-stated)        Stay alive       Depression Screen     05/26/2023   10:23 AM  PHQ 2/9 Scores  PHQ - 2 Score 0  PHQ- 9 Score 6    Fall Risk     05/26/2023   10:18 AM  Fall Risk   Falls in the past year? 0  Number falls in past yr: 0  Injury with Fall? 0  Risk for fall due to : No Fall Risks  Follow up Falls prevention discussed;Falls evaluation completed    MEDICARE RISK AT HOME:  Medicare Risk at Home Any stairs in or around the home?: Yes If so, are there any without handrails?: Yes Home free of loose throw rugs in walkways, pet beds, electrical cords, etc?: Yes Adequate lighting in your home to reduce risk of falls?: Yes Life alert?: No Use of a cane, walker or w/c?: No Grab bars in the bathroom?: No Shower chair or bench in shower?: Yes Elevated toilet seat or a handicapped toilet?: Yes  TIMED UP AND GO:  Was the test performed?  No  Cognitive Function: 6CIT completed        05/26/2023    10:19 AM  6CIT Screen  What Year? 0 points  What month? 0 points  What time? 0 points  Count back from 20 0 points  Months in reverse 0 points  Repeat phrase 0 points  Total Score 0 points    Immunizations Immunization History  Administered Date(s) Administered   Fluad Quad(high Dose 65+) 12/02/2018, 12/17/2021   Hep A, Unspecified 03/04/2015   Influenza, High Dose Seasonal PF 11/18/2017, 03/09/2018, 12/28/2022   Influenza, Seasonal, Injecte, Preservative Fre 04/13/2017   Influenza,inj,Quad PF,6+ Mos 02/02/2017   Influenza-Unspecified 03/16/2011, 12/12/2014, 02/18/2018, 02/19/2019, 01/01/2020, 01/18/2020, 12/27/2021   Moderna Covid-19 Fall Seasonal Vaccine 12yrs & older 12/28/2022   Moderna Sars-Cov-2 Peds vaccine 36yrs thru 110yrs 05/15/2019   Moderna Sars-Covid-2 Vaccination 04/16/2019, 05/15/2019, 01/18/2020   PNEUMOCOCCAL CONJUGATE-20 08/06/2021   Pneumococcal Conjugate-13 08/07/2014, 04/13/2017, 10/08/2018, 12/02/2018   Pneumococcal Polysaccharide-23 09/16/2017   Respiratory Syncytial Virus Vaccine,Recomb Aduvanted(Arexvy) 12/17/2021   Tdap 09/02/2014, 04/13/2017, 09/16/2017, 04/27/2018   Typhoid Inactivated 03/04/2015   Unspecified SARS-COV-2 Vaccination 12/17/2021   Yellow Fever 11/22/2015   Zoster Recombinant(Shingrix) 03/09/2018, 10/08/2018, 12/02/2018, 02/06/2020, 04/10/2020   Zoster, Live 03/30/2011, 01/20/2013    Screening Tests Health Maintenance  Topic Date Due   Hepatitis C Screening  Never done   COVID-19 Vaccine (6 - 2024-25 season) 02/22/2023   Medicare Annual Wellness (AWV)  05/25/2024   Colonoscopy  12/14/2026   DTaP/Tdap/Td (5 - Td or Tdap) 04/27/2028   Pneumonia Vaccine 36+ Years old  Completed   INFLUENZA VACCINE  Completed   Zoster Vaccines- Shingrix  Completed   HPV VACCINES  Aged Out    Health Maintenance  Health Maintenance Due  Topic Date Due   Hepatitis C Screening  Never done   COVID-19 Vaccine (6 - 2024-25 season) 02/22/2023    Health Maintenance Items Addressed: Hepatitis C Screening  Additional  Screening:  Vision Screening: Recommended annual ophthalmology exams for early detection of glaucoma and other disorders of the eye.  Dental Screening: Recommended annual dental exams for proper oral hygiene  Community Resource Referral / Chronic Care Management: CRR required this visit?  No   CCM required this visit?  No     Plan:     I have personally reviewed and noted the following in the patient's chart:   Medical and social history Use of alcohol, tobacco or illicit drugs  Current medications and supplements including opioid prescriptions. Patient is not currently taking opioid prescriptions. Functional ability and status Nutritional status Physical activity Advanced directives List of other physicians Hospitalizations, surgeries, and ER visits in previous 12 months Vitals Screenings to include cognitive, depression, and falls Referrals and appointments  In addition, I have reviewed and discussed with patient certain preventive protocols, quality metrics, and best practice recommendations. A written personalized care plan for preventive services as well as general preventive health recommendations were provided to patient.     Briley Sulton L Alyssamae Klinck, CMA   05/26/2023   After Visit Summary: (MyChart) Due to this being a telephonic visit, the after visit summary with patients personalized plan was offered to patient via MyChart   Notes: Please refer to Routing Comments.

## 2023-06-10 ENCOUNTER — Other Ambulatory Visit: Payer: Self-pay | Admitting: Orthopaedic Surgery

## 2023-06-10 DIAGNOSIS — M14671 Charcot's joint, right ankle and foot: Secondary | ICD-10-CM

## 2023-06-13 ENCOUNTER — Inpatient Hospital Stay: Admission: RE | Admit: 2023-06-13 | Source: Ambulatory Visit

## 2023-06-13 ENCOUNTER — Ambulatory Visit
Admission: RE | Admit: 2023-06-13 | Discharge: 2023-06-13 | Disposition: A | Discharge status: Home / Self Care | Source: Ambulatory Visit | Attending: Orthopaedic Surgery | Admitting: Orthopaedic Surgery

## 2023-06-13 DIAGNOSIS — M14671 Charcot's joint, right ankle and foot: Secondary | ICD-10-CM

## 2023-06-17 ENCOUNTER — Other Ambulatory Visit (HOSPITAL_COMMUNITY): Payer: Self-pay

## 2023-06-17 ENCOUNTER — Telehealth: Payer: Self-pay | Admitting: Cardiology

## 2023-06-17 MED ORDER — WEGOVY 2.4 MG/0.75ML ~~LOC~~ SOAJ
2.4000 mg | SUBCUTANEOUS | 6 refills | Status: DC
  Filled 2023-06-17: qty 3, 28d supply, fill #0
  Filled 2023-07-10: qty 3, 28d supply, fill #1
  Filled 2023-08-23: qty 3, 28d supply, fill #2
  Filled 2023-09-29: qty 3, 28d supply, fill #3

## 2023-06-17 NOTE — Telephone Encounter (Signed)
*  STAT* If patient is at the pharmacy, call can be transferred to refill team.   1. Which medications need to be refilled? (please list name of each medication and dose if known) Semaglutide-Weight Management (WEGOVY) 1.7 MG/0.75ML SOAJ    2. Would you like to learn more about the convenience, safety, & potential cost savings by using the [facility]?    3. Are you open to using the [facility] (Type [facility].   4. Which pharmacy/location (including street and city if local pharmacy) is medication to be sent to? [facility] Phone: [phone]  Fax: [phone]       5. Do they need a 30 day or 90 day supply? 90 Pt needs ASAP he is leaving to go out of town tomorrow

## 2023-06-17 NOTE — Telephone Encounter (Signed)
 Spoke to pt, tolerates 1.7 mg Wegovy well. Lost almost 20 lbs so far. Ready to move on to the next dose 2.4 mg once a week. Will send Wegovy 2.4 mg prescription 1 month with 5 refills.

## 2023-07-11 ENCOUNTER — Other Ambulatory Visit (HOSPITAL_COMMUNITY): Payer: Self-pay

## 2023-07-13 ENCOUNTER — Other Ambulatory Visit (HOSPITAL_COMMUNITY): Payer: Self-pay

## 2023-07-18 ENCOUNTER — Telehealth: Payer: Self-pay

## 2023-07-18 NOTE — Telephone Encounter (Signed)
 Spoke with Dr. Renna Cary and he is going to see the patient tomorrow at 1:00pm.  Patient is aware.

## 2023-07-18 NOTE — Telephone Encounter (Signed)
-----   Message from [employer] S sent at 07/18/2023  1:54 PM EDT ----- Regarding: Walk In Reason for walk-in: Walk-in Reasons: EKG Request / appointment request  If patient is requesting to be seen today, or if patient is having symptoms: No symptoms but needs it for surgery clearance   What symptoms are being reported (if any)?  N/A  Pt says he has an upcoming surgery and requested that we just do a EKG on him .

## 2023-07-18 NOTE — Telephone Encounter (Signed)
 Spoke with the patient who states that he has a surgical clearance form that he needs signed. He states that he also needs to have an EKG done today for his upcoming surgery. Explained that he can drop the form off and our pre-op team will review and determine what is needed. Advised that we do not take walk-in patients for EKGs.  My clinical nurse manager will go out to the lobby and speak with the patient directly

## 2023-07-19 ENCOUNTER — Encounter: Payer: Self-pay | Admitting: Cardiology

## 2023-07-19 ENCOUNTER — Ambulatory Visit: Attending: Cardiology | Admitting: Cardiology

## 2023-07-19 VITALS — BP 96/58 | HR 80 | Ht 73.0 in | Wt 223.6 lb

## 2023-07-19 DIAGNOSIS — Z01810 Encounter for preprocedural cardiovascular examination: Secondary | ICD-10-CM

## 2023-07-19 DIAGNOSIS — Z951 Presence of aortocoronary bypass graft: Secondary | ICD-10-CM

## 2023-07-19 DIAGNOSIS — I10 Essential (primary) hypertension: Secondary | ICD-10-CM | POA: Diagnosis not present

## 2023-07-19 DIAGNOSIS — I2510 Atherosclerotic heart disease of native coronary artery without angina pectoris: Secondary | ICD-10-CM

## 2023-07-19 LAB — CBC WITH DIFFERENTIAL/PLATELET
Basophils Absolute: 0.1 10*3/uL (ref 0.0–0.2)
Basos: 1 %
EOS (ABSOLUTE): 0.1 10*3/uL (ref 0.0–0.4)
Eos: 2 %
Hematocrit: 39.6 % (ref 37.5–51.0)
Hemoglobin: 13.7 g/dL (ref 13.0–17.7)
Immature Grans (Abs): 0 10*3/uL (ref 0.0–0.1)
Immature Granulocytes: 0 %
Lymphocytes Absolute: 1.5 10*3/uL (ref 0.7–3.1)
Lymphs: 27 %
MCH: 31.9 pg (ref 26.6–33.0)
MCHC: 34.6 g/dL (ref 31.5–35.7)
MCV: 92 fL (ref 79–97)
Monocytes Absolute: 0.6 10*3/uL (ref 0.1–0.9)
Monocytes: 11 %
Neutrophils Absolute: 3.3 10*3/uL (ref 1.4–7.0)
Neutrophils: 59 %
Platelets: 261 10*3/uL (ref 150–450)
RBC: 4.29 x10E6/uL (ref 4.14–5.80)
RDW: 12.5 % (ref 11.6–15.4)
WBC: 5.7 10*3/uL (ref 3.4–10.8)

## 2023-07-19 LAB — COMPREHENSIVE METABOLIC PANEL WITH GFR
ALT: 42 IU/L (ref 0–44)
AST: 36 IU/L (ref 0–40)
Albumin: 4 g/dL (ref 3.8–4.8)
Alkaline Phosphatase: 78 IU/L (ref 44–121)
BUN/Creatinine Ratio: 18 (ref 10–24)
BUN: 17 mg/dL (ref 8–27)
Bilirubin Total: 0.6 mg/dL (ref 0.0–1.2)
CO2: 23 mmol/L (ref 20–29)
Calcium: 9.3 mg/dL (ref 8.6–10.2)
Chloride: 105 mmol/L (ref 96–106)
Creatinine, Ser: 0.96 mg/dL (ref 0.76–1.27)
Globulin, Total: 2.7 g/dL (ref 1.5–4.5)
Glucose: 86 mg/dL (ref 70–99)
Potassium: 4.8 mmol/L (ref 3.5–5.2)
Sodium: 141 mmol/L (ref 134–144)
Total Protein: 6.7 g/dL (ref 6.0–8.5)
eGFR: 83 mL/min/{1.73_m2} (ref >59)

## 2023-07-19 LAB — SEDIMENTATION RATE: Sed Rate: 9 mm/h (ref 0–30)

## 2023-07-19 LAB — C-REACTIVE PROTEIN: CRP: 1 mg/L (ref 0–10)

## 2023-07-19 NOTE — Patient Instructions (Signed)

## 2023-07-19 NOTE — Progress Notes (Signed)
 Cardiology Office Note:  .   Date:  07/19/2023  ID:  Aaron [name], DOB March 11, 1951, MRN [id number] PCP: Colene Dauphin, MD  [facility] Providers Cardiologist:  Dorothye Gathers, MD    History of Present Illness: .   Aaron [name] is a 73 y.o. male Discussed with the use of AI scribe  History of Present Illness Aaron [name] "Aaron [name]" is a 73 year old male with coronary artery disease, status post CABG, hypertension, and hyperlipidemia who presents for preoperative evaluation prior to orthopedic foot surgery.  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 He plans to travel to [place] for the surgery, as the surgeon who initially placed the prosthetic is now located there.  He underwent a myocardial perfusion scan on Aug 18, 2022, which showed low risk with no ischemia. An old inferior infarct was noted, unchanged from prior studies. His ejection fraction was 50% on the stress test. An echocardiogram on March 10, 2021, showed normal pump function with an ejection fraction of 55-60% and mild left ventricular hypertrophy. There were no valvular abnormalities.  He is currently taking atorvastatin  80 mg once daily, aspirin  81 mg daily, irbesartan  150 mg daily, nebivolol  10 mg daily, and Wegovy . He experiences significant limitations in physical activity due to his foot condition, stating he 'can't swim, can't walk,' and has become 'out of shape.' He uses a recumbent elliptical for exercise. No unusual chest pain or shortness of breath, although he notes difficulty assessing these symptoms due to his current physical condition.  He is concerned about his wife's well-being, as he is her primary caregiver.       Studies Reviewed: Aaron [name]   EKG Interpretation Date/Time:  Tuesday July 19 2023 13:20:51 EDT Ventricular Rate:  79 PR  Interval:  200 QRS Duration:  124 QT Interval:  400 QTC Calculation: 458 R Axis:   62  Text Interpretation: Normal sinus rhythm Cannot rule out Inferior infarct (cited on or before 02-Oct-2007) When compared with ECG of 07-Oct-2007 05:56, No significant change since last tracing Confirmed by Dorothye Gathers ([id number]) on 07/19/2023 1:25:23 PM    Results DIAGNOSTIC Myocardial perfusion scan: Low risk, no ischemia, old inferior infarct, no change from prior (08/18/2022) Ejection fraction: 50% (08/18/2022) Echocardiogram: Normal pump function, EF 55-60%, mild LVH, normal right ventricular size and structure, no valvular abnormalities (03/10/2021) EKG: Stable, normal rhythm, no atrial fibrillation Risk Assessment/Calculations:            Physical Exam:   VS:  BP (!) 96/58   Pulse 80   Ht 6\' 1"  (1.854 m)   Wt 223 lb 9.6 oz (101.4 kg)   SpO2 98%   BMI 29.50 kg/m    Wt Readings from Last 3 Encounters:  07/19/23 223 lb 9.6 oz (101.4 kg)  05/26/23 228 lb (103.4 kg)  02/15/23 248 lb (112.5 kg)    GEN: Well nourished, well developed in no acute distress NECK: No JVD; No carotid bruits CARDIAC: RRR, no murmurs, no rubs, no gallops RESPIRATORY:  Clear to auscultation without rales, wheezing or rhonchi  ABDOMEN: Soft, non-tender, non-distended EXTREMITIES:  No edema; No deformity   ASSESSMENT AND PLAN: .    Assessment and Plan Assessment & Plan Coronary artery disease Coronary artery disease, status post CABG in 2009. Recent myocardial perfusion scan on 08/18/2022 showed low risk with no ischemia and an  old inferior infarct, unchanged from prior. Ejection fraction was 50% on stress test. Echocardiogram on 03/10/2021 showed normal pump function with EF of 55-60% and mild LVH. No valvular abnormalities. No current chest pain or unusual dyspnea reported. He is well-managed from a cardiac perspective for the upcoming orthopedic surgery. - Proceed with orthopedic foot surgery as planned, deemed low  risk from a cardiac perspective. - Hold Wegovy  one week prior to surgery.  Hypertension Hypertension is well-managed with irbesartan  150 mg daily and nebivolol  10 mg daily. Blood pressure is stable with current regimen. Asymptomatic  Hyperlipidemia Hyperlipidemia is well-managed with atorvastatin  80 mg daily. Current management is ongoing.LDL prior 71  Obesity Obesity is being managed with Wegovy . He is advised to hold this medication one week prior to surgery.  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 Current foot issues include a prosthetic that has caused a non-healing sore, necessitating upcoming surgery in [place]. - Proceed with planned foot surgery in [place].       Signed, Dorothye Gathers, MD

## 2023-07-21 ENCOUNTER — Telehealth: Payer: Self-pay | Admitting: Cardiology

## 2023-07-21 NOTE — Telephone Encounter (Signed)
 [name] called in asking for clearance/OV note to be faxed over. She states they faxed it over on 07/06/23 and pt told them it was okay per Dr. Renna Cary.   Fax: [phone]

## 2023-07-21 NOTE — Telephone Encounter (Signed)
 Faxed over Dr. Annabell Baron visit note for surgical clearance to  [phone]

## 2023-08-09 ENCOUNTER — Encounter: Payer: Self-pay | Admitting: Internal Medicine

## 2023-08-09 NOTE — Progress Notes (Deleted)
      Subjective:    Patient ID: Aaron [name], male    DOB: [birth year], 73 y.o.   MRN: [id number]     HPI Aaron [name] is here for follow up of his chronic medical problems.  Infected R foot wound -   On wegovy  - prescribed by Dr Renna Cary -   Medications and allergies reviewed with patient and updated if appropriate.  Current Outpatient Medications on File Prior to Visit  Medication Sig Dispense Refill   aspirin  EC 81 MG tablet Take 1 tablet (81 mg total) by mouth daily. Swallow whole. 90 tablet 3   atorvastatin  (LIPITOR) 80 MG tablet TAKE 1 TABLET BY MOUTH EVERY DAY 90 tablet 2   cefadroxil (DURICEF) 500 MG capsule Take 500 mg by mouth 2 (two) times daily.     Cobalamin Combinations (VITAMIN B12-FOLIC ACID) 500-400 MCG TABS 1 tablet once daily (Patient not taking: Reported on 07/19/2023)     Coenzyme Q10 (COQ-10 PO) Take 1 tablet by mouth daily.     finasteride (PROSCAR) 5 MG tablet Take 5 mg by mouth every morning.     irbesartan  (AVAPRO ) 150 MG tablet Take 1 tablet (150 mg total) by mouth daily. 90 tablet 3   nebivolol  (BYSTOLIC ) 10 MG tablet TAKE 1 TABLET BY MOUTH EVERY DAY 90 tablet 2   Semaglutide -Weight Management (WEGOVY ) 2.4 MG/0.75ML SOAJ Inject 2.4 mg into the skin once a week. 3 mL 6   tamsulosin (FLOMAX) 0.4 MG CAPS capsule tamsulosin 0.4 mg capsule  TAKE 1 CAPSULE BY MOUTH EVERY DAY     No current facility-administered medications on file prior to visit.     Review of Systems     Objective:  There were no vitals filed for this visit. BP Readings from Last 3 Encounters:  07/19/23 (!) 96/58  02/15/23 114/78  01/28/23 (!) 144/80   Wt Readings from Last 3 Encounters:  07/19/23 223 lb 9.6 oz (101.4 kg)  05/26/23 228 lb (103.4 kg)  02/15/23 248 lb (112.5 kg)   There is no height or weight on file to calculate BMI.    Physical Exam     Lab Results  Component Value Date   WBC 5.7 07/18/2023   HGB 13.7 07/18/2023   HCT 39.6 07/18/2023   PLT 261 07/18/2023    GLUCOSE 86 07/18/2023   CHOL 163 09/06/2019   TRIG 119 09/06/2019   HDL 71 09/06/2019   LDLCALC 71 09/06/2019   ALT 42 07/18/2023   AST 36 07/18/2023   NA 141 07/18/2023   K 4.8 07/18/2023   CL 105 07/18/2023   CREATININE 0.96 07/18/2023   BUN 17 07/18/2023   CO2 23 07/18/2023   TSH 0.603 02/14/2023   INR 1.2 10/03/2007   HGBA1C  10/02/2007    5.3 (NOTE)   The ADA recommends the following therapeutic goal for glycemic   control related to Hgb A1C measurement:   Goal of Therapy:   < 7.0% Hgb A1C   Reference: American Diabetes Association: Clinical Practice   Recommendations 2008, Diabetes Care,  2008, 31:(Suppl 1).     Assessment & Plan:    See Problem List for Assessment and Plan of chronic medical problems.

## 2023-08-10 ENCOUNTER — Other Ambulatory Visit (HOSPITAL_COMMUNITY): Payer: Self-pay

## 2023-08-10 ENCOUNTER — Ambulatory Visit: Admitting: Internal Medicine

## 2023-08-12 ENCOUNTER — Encounter: Payer: Self-pay | Admitting: Podiatry

## 2023-08-18 ENCOUNTER — Encounter: Payer: Self-pay | Admitting: Internal Medicine

## 2023-08-18 NOTE — Progress Notes (Signed)
 Subjective:    Patient ID: Aaron [name], male    DOB: 05-14-1950, 73 y.o.   MRN: [id number]      HPI Aaron [name] is here for  Chief Complaint  Patient presents with   Foot Injury    Right foot surgery wants doctor look at it to see if it healing well.   Medication Reaction    Wants to talk about it making him tired and if he should decrease the dosage. 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  Since then he has been having some soreness in his ankle area which he is not sure why.  He is not sure if it has improved much, but its only been 3 weeks.  He would like me to look at it.  Infectious disease has placed him on cefadroxil  twice daily for primary prophylaxis.  He would like me to renew this.  Wegovy  - on weogyv - feels tired - ? Wegovy  or stress with wife.  He has had some increased fatigue and was not sure if it was the Wegovy .  He has been on Wegovy  for a while and has done well with it, but he was not sure if the higher dose could now be causing some fatigue.  There is also some increased stress with his wife which could be contributing.    Medications and allergies reviewed with patient and updated if appropriate.  Current Outpatient Medications on File Prior to Visit  Medication Sig Dispense Refill   aspirin  EC 81 MG tablet Take 1 tablet (81 mg total) by mouth daily. Swallow whole. 90 tablet 3   atorvastatin  (LIPITOR) 80 MG tablet TAKE 1 TABLET BY MOUTH EVERY DAY 90 tablet 2   Coenzyme Q10 (COQ-10 PO) Take 1 tablet by mouth daily.     finasteride (PROSCAR) 5 MG tablet Take 5 mg by mouth every morning.     irbesartan  (AVAPRO ) 150 MG tablet Take 1 tablet (150 mg total) by mouth daily. 90 tablet 3   nebivolol  (BYSTOLIC ) 10 MG tablet TAKE 1 TABLET BY MOUTH EVERY DAY 90 tablet 2   Semaglutide -Weight Management (WEGOVY ) 2.4 MG/0.75ML SOAJ  Inject 2.4 mg into the skin once a week. 3 mL 6   tamsulosin (FLOMAX) 0.4 MG CAPS capsule tamsulosin 0.4 mg capsule  TAKE 1 CAPSULE BY MOUTH EVERY DAY     No current facility-administered medications on file prior to visit.    Review of Systems     Objective:   Vitals:   08/19/23 1051  BP: 100/72  Pulse: 73  Temp: 98.8 F (37.1 C)  SpO2: 99%   BP Readings from Last 3 Encounters:  08/19/23 100/72  07/19/23 (!) 96/58  02/15/23 114/78   Wt Readings from Last 3 Encounters:  08/19/23 219 lb (99.3 kg)  07/19/23 223 lb 9.6 oz (101.4 kg)  05/26/23 228 lb (103.4 kg)   Body mass index is 28.89 kg/m.    Physical Exam Constitutional:      General: He is not in acute distress.    Appearance: Normal appearance. He is not ill-appearing.  HENT:     Head: Normocephalic and atraumatic.  Skin:    General: Skin is warm and dry.     Comments: Area of swelling right plantar surface of foot and arch the size of the quarter with central laceration-open wound  without active bleeding or discharge.  There is a small amount of blood on his Band-Aid.  No surrounding erythema or induration or fluctuance.  Neurological:     Mental Status: He is alert.            Assessment & Plan:    See Problem List for Assessment and Plan of chronic medical problems.

## 2023-08-19 ENCOUNTER — Ambulatory Visit: Admitting: Internal Medicine

## 2023-08-19 VITALS — BP 100/72 | HR 73 | Temp 98.8°F | Ht 73.0 in | Wt 219.0 lb

## 2023-08-19 DIAGNOSIS — R5382 Chronic fatigue, unspecified: Secondary | ICD-10-CM | POA: Diagnosis not present

## 2023-08-19 DIAGNOSIS — L98499 Non-pressure chronic ulcer of skin of other sites with unspecified severity: Secondary | ICD-10-CM

## 2023-08-19 DIAGNOSIS — L089 Local infection of the skin and subcutaneous tissue, unspecified: Secondary | ICD-10-CM

## 2023-08-19 MED ORDER — CEFADROXIL 500 MG PO CAPS: 500.0000 mg | ORAL_CAPSULE | Freq: Two times a day (BID) | ORAL | 1 refills | Status: DC

## 2023-08-19 NOTE — Patient Instructions (Signed)
     The antibiotic was sent to your pharmacy.

## 2023-08-19 NOTE — Assessment & Plan Note (Signed)
 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

## 2023-08-19 NOTE — Assessment & Plan Note (Signed)
 Acute on chronic Do not think this is related to the Wegovy  since he has done so well with it I do think with recent stress increase that is likely contributing to some of his fatigue

## 2023-08-23 ENCOUNTER — Telehealth: Payer: Self-pay | Admitting: Podiatry

## 2023-08-23 ENCOUNTER — Other Ambulatory Visit (HOSPITAL_COMMUNITY): Payer: Self-pay

## 2023-08-23 NOTE — Telephone Encounter (Signed)
"  Aaron"[name] had surgery 1 month ago in [place] and needs to been to make sure everything is healing correctly. He asks would Dr.Wagoner work him in this week for a follow up. He can come in any day at anytime.

## 2023-08-24 NOTE — Telephone Encounter (Signed)
 Patient scheduled 09/05/2023 1:45.

## 2023-08-29 ENCOUNTER — Ambulatory Visit: Admitting: Podiatry

## 2023-08-29 ENCOUNTER — Ambulatory Visit: Admitting: Orthopedic Surgery

## 2023-08-29 ENCOUNTER — Encounter: Payer: Self-pay | Admitting: Podiatry

## 2023-08-29 ENCOUNTER — Other Ambulatory Visit: Payer: Self-pay

## 2023-08-29 DIAGNOSIS — L97512 Non-pressure chronic ulcer of other part of right foot with fat layer exposed: Secondary | ICD-10-CM | POA: Diagnosis not present

## 2023-08-29 DIAGNOSIS — M65342 Trigger finger, left ring finger: Secondary | ICD-10-CM | POA: Diagnosis not present

## 2023-08-29 MED ORDER — BETAMETHASONE SOD PHOS & ACET 6 (3-3) MG/ML IJ SUSP
6.0000 mg | INTRAMUSCULAR | Status: AC | PRN
Start: 2023-08-29 — End: 2023-08-29
  Administered 2023-08-29: 6 mg via INTRA_ARTICULAR

## 2023-08-29 MED ORDER — LIDOCAINE HCL 1 % IJ SOLN
1.0000 mL | INTRAMUSCULAR | Status: AC | PRN
Start: 2023-08-29 — End: 2023-08-29
  Administered 2023-08-29: 1 mL

## 2023-08-29 NOTE — Progress Notes (Signed)
 Aaron [name] - 73 y.o. male MRN [id number]  Date of birth: 1950-08-05  Office Visit Note: Visit Date: 08/29/2023 PCP: Colene Dauphin, MD Referred by: Colene Dauphin, MD  Subjective: No chief complaint on file.  HPI: Aaron [name] is a pleasant 73 y.o. male who presents today for evaluation of a left ring trigger digit that has been present now for approximate 3 months.  States that the clicking and locking are quite frequent in nature, does often have locking requiring manual correction.  He has not undergone any prior workup or treatment for this condition.  He is overall active at baseline, left-hand-dominant.  Pertinent ROS were reviewed with the patient and found to be negative unless otherwise specified above in HPI.   Visit Reason:Left ring trigger finger  Duration of symptoms: on and off for 3 months Hand dominance: left Occupation:Retired  Diabetic: No Smoking: No Heart/Lung History: CABG 15 years ago Blood Thinners: aspirin   Prior Testing/EMG: none Injections (Date):none Treatments:none Prior Surgery:none  Assessment & Plan: Visit Diagnoses:  1. Trigger finger, left ring finger     Plan: Extensive discussion was had with the patient today regarding his left ring finger trigger digit.  We discussed the etiology and pathophysiology of stenosing tenosynovitis.  We discussed conservative versus surgical treatment modalities.  From a conservative standpoint, we discussed activity modification, splinting, therapy and injections.  From a surgical standpoint, we discussed the possibility for trigger digit release as well as all risk and benefits associated.  Given that he has not trialed conservative treatments, patient is appropriate candidate for cortisone injection to the left ring finger A1 pulley for symptom relief.  Risks and benefit of the cortisone injection were discussed in detail, patient agreed to proceed.  Injection was provided today without issue,  patient will return in approximate 6 weeks time for a recheck or as needed.   Follow-up: No follow-ups on file.   Meds & Orders: No orders of the defined types were placed in this encounter.   Orders Placed This Encounter  Procedures   XR Hand Complete Left     Procedures: Hand/UE Inj: L ring A1 for trigger finger on 08/29/2023 2:13 PM Indications: pain Details: 25 G needle, volar approach Medications: 1 mL lidocaine 1 %; 6 mg betamethasone acetate-betamethasone sodium phosphate 6 (3-3) MG/ML Outcome: tolerated well, no immediate complications Procedure, treatment alternatives, risks and benefits explained, specific risks discussed. Consent was given by the patient. Patient was prepped and draped in the usual sterile fashion.          Clinical History: No specialty comments available.  He reports that he has quit smoking. He has never used smokeless tobacco. No results for input(s): "HGBA1C", "LABURIC" in the last 8760 hours.  Objective:   Vital Signs: There were no vitals taken for this visit.  Physical Exam  Gen: Well-appearing, in no acute distress; non-toxic CV: Regular Rate. Well-perfused. Warm.  Resp: Breathing unlabored on room air; no wheezing. Psych: Fluid speech in conversation; appropriate affect; normal thought process  Ortho Exam Left hand: - Palpable nodule at the A1 pulley of the ring finger, associated tenderness - Notable clicking with deep flexion of the ring finger, no  evidence of significant locking with deep flexion - Sensation intact distally, hand remains warm well-perfused   Imaging: No results found.  Past Medical/Family/Surgical/Social History: Medications & Allergies reviewed per EMR, new medications updated. 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   Syncope 04/27/2018   Chronic fatigue 09/08/2017   Polyneuropathy 05/28/2016   Tubular adenoma of colon 09/23/2015   Obstructive sleep apnea (adult) (pediatric) 02/21/2015   Coronary atherosclerosis of native coronary artery 08/23/2013   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   Pure hypercholesterolemia 08/23/2013   Essential hypertension 12/29/2006   BENIGN PROSTATIC HYPERTROPHY 12/29/2006   Past Medical History:  Diagnosis Date   Coronary artery disease    GERD with apnea    Hyperlipidemia    Hypertension    Hypertrophy of prostate    Obesity    OSA on CPAP    Sleep apnea    uses CPAP   Family History  Problem Relation Age of Onset   Hypertension Father    Colon cancer Neg Hx    Esophageal cancer Neg Hx    Stomach cancer Neg Hx    Rectal cancer Neg Hx    Past Surgical History:  Procedure Laterality Date   COLONOSCOPY  2016   [place], [place] =polyps   CORONARY ARTERY BYPASS GRAFT  2009   FOOT SURGERY     multiple   TOTAL HIP ARTHROPLASTY     Social History   Occupational History   Occupation: RETIRED  Tobacco Use   Smoking status: Former   Smokeless tobacco: Never  Advertising account planner   Vaping status: Never Used  Substance and Sexual Activity   Alcohol use: No   Drug use: No   Sexual activity: Not on file    Marshaun Lortie Merlinda Starling) Marce Sensing, M.D. [facility], Hand Surgery

## 2023-08-29 NOTE — Progress Notes (Signed)
  Subjective:  Patient ID: Aaron [name], male    DOB: 11-26-1950,  MRN: [id number]  Chief Complaint  Patient presents with   Callouses    Right foot callus. 4 pain. Burning feet. Non diabetic.       73 y.o. male returns for follow-up he had surgery 1 month ago with Dr. Suzzane Estes in [place] for exostectomy  Review of Systems: Negative except as noted in the HPI. Denies N/V/F/Ch.   Objective:  There were no vitals filed for this visit. There is no height or weight on file to calculate BMI. Constitutional Well developed. Well nourished.  Vascular Foot warm and well perfused. Capillary refill normal to all digits.  Calf is soft and supple, no posterior calf or knee pain, negative Homans' sign  Neurologic Normal speech. Oriented to person, place, and time. Epicritic sensation to light touch grossly absent on the right foot  Dermatologic Full-thickness ulceration on plantar right midfoot measures approximately 0.8 x 0.2 x 0.3 cm with exposed subcutaneous tissue, large surrounding hyperkeratosis, no exposed bone tendon or joint, no cellulitis, drainage, malodor today   Orthopedic: Chronic right foot rocker-bottom foot deformity, dislocated second MTPJ with hammertoe deformities      [facility]08/23/2024 Component 11/19/2022 11/19/2022 11/19/2022 11/19/2022 11/19/2022           Sed Rate - [facility] -- -- -- 3 -- Load older lab results  C Reactive Protein - [facility] -- -- -- -- <1 Load older lab results  D-Dimer - [facility] 0.23 -- -- -- --   Interleukin-6, Serum - [facility] -- 2.5 -- -- --   Procalcitonin - [facility] -- -- 0.06      Assessment:   1. Ulcer of right foot with fat layer exposed (HCC)    Plan:  Patient was evaluated and treated and all questions answered.  Chronic right foot deformities and ulcer, I had significant improvement after ostectomy.  The wound bed and surrounding tissue was debrided of hyperkeratosis nonviable tissue and slough  in a full-thickness excisional manner to the subcutaneous layer sharply with a scalpel today.  Post debridement measurements are noted above.  Povidone ointment and bandage was applied.  Hemostasis achieved manually.  Follow-up in 1 month for reevaluation.  Return if symptoms worsen or fail to improve.

## 2023-09-05 ENCOUNTER — Ambulatory Visit: Admitting: Podiatry

## 2023-09-13 ENCOUNTER — Ambulatory Visit: Admitting: Podiatry

## 2023-09-25 ENCOUNTER — Encounter: Payer: Self-pay | Admitting: Internal Medicine

## 2023-09-25 NOTE — Progress Notes (Unsigned)
    Subjective:    Patient ID: Aaron [name], male    DOB: [birth year], 73 y.o.   MRN: [id number]      HPI Aaron [name] is here for No chief complaint on file.   OSA-  Last colonoscopy 2021-Dr. Stark-polyps removed that were precancerous.  Advised repeat colonoscopy in 7 years-due 2028.  Fatigue -   ?  OSA, sleep, activity level.  CBC, CMP normal April.   Medications and allergies reviewed with patient and updated if appropriate.  Current Outpatient Medications on File Prior to Visit  Medication Sig Dispense Refill   aspirin  EC 81 MG tablet Take 1 tablet (81 mg total) by mouth daily. Swallow whole. 90 tablet 3   atorvastatin  (LIPITOR) 80 MG tablet TAKE 1 TABLET BY MOUTH EVERY DAY 90 tablet 2   cefadroxil  (DURICEF) 500 MG capsule Take 1 capsule (500 mg total) by mouth 2 (two) times daily. 180 capsule 1   Coenzyme Q10 (COQ-10 PO) Take 1 tablet by mouth daily.     finasteride (PROSCAR) 5 MG tablet Take 5 mg by mouth every morning.     irbesartan  (AVAPRO ) 150 MG tablet Take 1 tablet (150 mg total) by mouth daily. 90 tablet 3   nebivolol  (BYSTOLIC ) 10 MG tablet TAKE 1 TABLET BY MOUTH EVERY DAY 90 tablet 2   Semaglutide -Weight Management (WEGOVY ) 2.4 MG/0.75ML SOAJ Inject 2.4 mg into the skin once a week. 3 mL 6   tamsulosin (FLOMAX) 0.4 MG CAPS capsule tamsulosin 0.4 mg capsule  TAKE 1 CAPSULE BY MOUTH EVERY DAY     No current facility-administered medications on file prior to visit.    Review of Systems     Objective:  There were no vitals filed for this visit. BP Readings from Last 3 Encounters:  08/19/23 100/72  07/19/23 (!) 96/58  02/15/23 114/78   Wt Readings from Last 3 Encounters:  08/19/23 219 lb (99.3 kg)  07/19/23 223 lb 9.6 oz (101.4 kg)  05/26/23 228 lb (103.4 kg)   There is no height or weight on file to calculate BMI.    Physical Exam         Assessment & Plan:    See Problem List for Assessment and Plan of chronic medical problems.

## 2023-09-26 ENCOUNTER — Ambulatory Visit: Admitting: Internal Medicine

## 2023-09-26 VITALS — BP 102/60 | HR 68 | Temp 98.3°F | Ht 73.0 in | Wt 217.0 lb

## 2023-09-26 DIAGNOSIS — K9089 Other intestinal malabsorption: Secondary | ICD-10-CM | POA: Diagnosis not present

## 2023-09-26 DIAGNOSIS — Z9884 Bariatric surgery status: Secondary | ICD-10-CM | POA: Diagnosis not present

## 2023-09-26 DIAGNOSIS — G4733 Obstructive sleep apnea (adult) (pediatric): Secondary | ICD-10-CM | POA: Diagnosis not present

## 2023-09-26 DIAGNOSIS — R5382 Chronic fatigue, unspecified: Secondary | ICD-10-CM | POA: Diagnosis not present

## 2023-09-26 DIAGNOSIS — K909 Intestinal malabsorption, unspecified: Secondary | ICD-10-CM | POA: Insufficient documentation

## 2023-09-26 LAB — CBC WITH DIFFERENTIAL/PLATELET
Basophils Absolute: 0.1 10*3/uL (ref 0.0–0.1)
Basophils Relative: 1 % (ref 0.0–3.0)
Eosinophils Absolute: 0.2 10*3/uL (ref 0.0–0.7)
Eosinophils Relative: 2.9 % (ref 0.0–5.0)
HCT: 38.4 % — ABNORMAL LOW (ref 39.0–52.0)
Hemoglobin: 12.8 g/dL — ABNORMAL LOW (ref 13.0–17.0)
Lymphocytes Relative: 21.6 % (ref 12.0–46.0)
Lymphs Abs: 1.4 10*3/uL (ref 0.7–4.0)
MCHC: 33.3 g/dL (ref 30.0–36.0)
MCV: 92.5 fl (ref 78.0–100.0)
Monocytes Absolute: 0.7 10*3/uL (ref 0.1–1.0)
Monocytes Relative: 11.8 % (ref 3.0–12.0)
Neutro Abs: 3.9 10*3/uL (ref 1.4–7.7)
Neutrophils Relative %: 62.7 % (ref 43.0–77.0)
Platelets: 246 10*3/uL (ref 150.0–400.0)
RBC: 4.15 Mil/uL — ABNORMAL LOW (ref 4.22–5.81)
RDW: 14.9 % (ref 11.5–15.5)
WBC: 6.3 10*3/uL (ref 4.0–10.5)

## 2023-09-26 LAB — COMPREHENSIVE METABOLIC PANEL WITH GFR
ALT: 23 U/L (ref 0–53)
AST: 20 U/L (ref 0–37)
Albumin: 3.7 g/dL (ref 3.5–5.2)
Alkaline Phosphatase: 67 U/L (ref 39–117)
BUN: 21 mg/dL (ref 6–23)
CO2: 26 meq/L (ref 19–32)
Calcium: 9.2 mg/dL (ref 8.4–10.5)
Chloride: 107 meq/L (ref 96–112)
Creatinine, Ser: 0.93 mg/dL (ref 0.40–1.50)
GFR: 80.75 mL/min (ref >60.00)
Glucose, Bld: 72 mg/dL (ref 70–99)
Potassium: 4.3 meq/L (ref 3.5–5.1)
Sodium: 140 meq/L (ref 135–145)
Total Bilirubin: 0.5 mg/dL (ref 0.2–1.2)
Total Protein: 7.2 g/dL (ref 6.0–8.3)

## 2023-09-26 LAB — IBC PANEL
Iron: 49 ug/dL (ref 42–165)
Saturation Ratios: 17.1 % — ABNORMAL LOW (ref 20.0–50.0)
TIBC: 287 ug/dL (ref 250.0–450.0)
Transferrin: 205 mg/dL — ABNORMAL LOW (ref 212.0–360.0)

## 2023-09-26 LAB — VITAMIN B12: Vitamin B-12: 535 pg/mL (ref 211–911)

## 2023-09-26 LAB — VITAMIN D 25 HYDROXY (VIT D DEFICIENCY, FRACTURES): VITD: 58.49 ng/mL (ref 30.00–100.00)

## 2023-09-26 LAB — FERRITIN: Ferritin: 66.1 ng/mL (ref 22.0–322.0)

## 2023-09-26 LAB — TSH: TSH: 0.58 u[IU]/mL (ref 0.35–5.50)

## 2023-09-26 NOTE — Assessment & Plan Note (Addendum)
 Chronic Going on at least 3 years-not sure if it started prior to that He does get fairly good quality sleep, but needs more than 8 hours-needs 10-hours Sometimes naps during the day Uses CPAP religiously, has lost a lot of weight and not sure if it needs to be reevaluated Denies depression, anxiety Check blood work including CBC, CMP, TSH, B12, vitamin D, iron panel given the possibility of malabsorption Will have CPAP reevaluated Stressed the importance of starting regular exercise

## 2023-09-26 NOTE — Assessment & Plan Note (Signed)
 Chronic Diagnosed with sleep apnea many years ago and has been on CPAP-uses religiously Still feels tired Was advised by the friend that he may need to consider BiPAP and would like to have this evaluated Referral to pulmonary

## 2023-09-26 NOTE — Assessment & Plan Note (Signed)
 Chronic Had surgery around 2020 ?  Malabsorption which could be causing some fatigue Check CBC, CMP, TSH, iron levels, vitamin B12, vitamin D

## 2023-09-26 NOTE — Assessment & Plan Note (Signed)
 History of Gastric sleeve ?  Malabsorption Not taking much supplementation Check CBC, CMP, iron panel, vitamin B12, vitamin D level

## 2023-09-26 NOTE — Patient Instructions (Addendum)
      Blood work was ordered.       Medications changes include :   None   Start exercising   A referral was ordered for pulmonary and someone will call you to schedule an appointment.

## 2023-09-28 ENCOUNTER — Ambulatory Visit: Payer: Self-pay | Admitting: Internal Medicine

## 2023-09-28 ENCOUNTER — Encounter: Payer: Self-pay | Admitting: Podiatry

## 2023-09-28 ENCOUNTER — Encounter: Payer: Self-pay | Admitting: Internal Medicine

## 2023-09-28 ENCOUNTER — Ambulatory Visit: Admitting: Podiatry

## 2023-09-28 DIAGNOSIS — L97512 Non-pressure chronic ulcer of other part of right foot with fat layer exposed: Secondary | ICD-10-CM

## 2023-09-28 NOTE — Progress Notes (Signed)
  Subjective:  Patient ID: Aaron [name], male    DOB: October 19, 1950,  MRN: [id number]  Chief Complaint  Patient presents with   Wound Check    Rm7 wound check right foot plantar/ aching with pressure      73 y.o. male returns for follow-up he notes little drainage  Review of Systems: Negative except as noted in the HPI. Denies N/V/F/Ch.   Objective:  There were no vitals filed for this visit. There is no height or weight on file to calculate BMI. Constitutional Well developed. Well nourished.  Vascular Foot warm and well perfused. Capillary refill normal to all digits.  Calf is soft and supple, no posterior calf or knee pain, negative Homans' sign  Neurologic Normal speech. Oriented to person, place, and time. Epicritic sensation to light touch grossly absent on the right foot  Dermatologic Full-thickness ulceration on plantar right midfoot measures approximately 0.4 x 0.2 x 0.2 cm with exposed subcutaneous tissue, large surrounding hyperkeratosis, no exposed bone tendon or joint, no cellulitis, drainage, malodor today   Orthopedic: Chronic right foot rocker-bottom foot deformity, dislocated second MTPJ with hammertoe deformities      [facility]08/23/2024 Component 11/19/2022 11/19/2022 11/19/2022 11/19/2022 11/19/2022           Sed Rate - [facility] -- -- -- 3 -- Load older lab results  C Reactive Protein - [facility] -- -- -- -- <1 Load older lab results  D-Dimer - [facility] 0.23 -- -- -- --   Interleukin-6, Serum - [facility] -- 2.5 -- -- --   Procalcitonin - [facility] -- -- 0.06      Assessment:   1. Ulcer of right foot with fat layer exposed (HCC)    Plan:  Patient was evaluated and treated and all questions answered.  Chronic right foot deformities and ulcer, Doing much better.  Nearly fully healed.  May increase activity level exercise as tolerated as it heals.  This includes swimming as well I expect in the next few weeks this should be healed  up.  Has had excellent progress since his ostectomy.  Return in about 1 month (around 10/29/2023) for wound care.

## 2023-09-29 ENCOUNTER — Ambulatory Visit: Admitting: Podiatry

## 2023-09-29 ENCOUNTER — Other Ambulatory Visit (HOSPITAL_COMMUNITY): Payer: Self-pay

## 2023-10-03 ENCOUNTER — Telehealth: Payer: Self-pay | Admitting: Cardiology

## 2023-10-03 NOTE — Telephone Encounter (Signed)
 Pt c/o medication issue:  1. Name of Medication:   Semaglutide -Weight Management (WEGOVY ) 2.4 MG/0.75ML SOAJ    2. How are you currently taking this medication (dosage and times per day)? As written   3. Are you having a reaction (difficulty breathing--STAT)? No   4. What is your medication issue? Pt called in asking if dose can be reduced back down to next to lowest dose. It stated the second dose that he started on. Please advise.

## 2023-10-04 ENCOUNTER — Other Ambulatory Visit (HOSPITAL_COMMUNITY): Payer: Self-pay

## 2023-10-04 MED ORDER — WEGOVY 1 MG/0.5ML ~~LOC~~ SOAJ
1.0000 mg | SUBCUTANEOUS | 5 refills | Status: DC
  Filled 2023-10-04: qty 2, 28d supply, fill #0
  Filled 2023-11-01: qty 2, 28d supply, fill #1
  Filled 2023-11-27: qty 2, 28d supply, fill #2

## 2023-10-04 NOTE — Telephone Encounter (Signed)
 Spoke with patient. He would like to decrease to Wegovy  1mg . Rx sent to [facility]

## 2023-10-05 ENCOUNTER — Other Ambulatory Visit (HOSPITAL_COMMUNITY): Payer: Self-pay

## 2023-10-12 ENCOUNTER — Ambulatory Visit: Admitting: Emergency Medicine

## 2023-10-12 ENCOUNTER — Encounter: Payer: Self-pay | Admitting: Emergency Medicine

## 2023-10-12 ENCOUNTER — Ambulatory Visit: Payer: Self-pay

## 2023-10-12 VITALS — BP 120/80 | HR 60 | Temp 98.4°F | Ht 73.0 in | Wt 214.0 lb

## 2023-10-12 DIAGNOSIS — K5909 Other constipation: Secondary | ICD-10-CM | POA: Diagnosis not present

## 2023-10-12 MED ORDER — LINACLOTIDE 145 MCG PO CAPS
145.0000 ug | ORAL_CAPSULE | Freq: Every day | ORAL | 1 refills | Status: DC
  Filled 2023-11-01 – 2023-11-04 (×2): qty 30, 30d supply, fill #0

## 2023-10-12 NOTE — Assessment & Plan Note (Addendum)
 Constipation of several weeks duration. Clinically stable.  No red flag signs or symptoms. Has tried most over-the-counter medications without relief Differential diagnosis discussed Colonoscopy report from 2021 reviewed.  It showed diverticulosis and a couple small polyps.  Otherwise unremarkable. No history of metabolic condition that may cause chronic constipation.  No thyroid condition. Presently on semaglutide .  Side effects discussed Diet and nutrition discussed.  Advised to stay well-hydrated We did not have a very good interaction today. Advised to follow-up with PCP and not schedule any more appointments with me.

## 2023-10-12 NOTE — Patient Instructions (Signed)

## 2023-10-12 NOTE — Progress Notes (Signed)
 Aaron [name] 73 y.o.   No chief complaint on file.   HISTORY OF PRESENT ILLNESS: This is a 74 y.o. male complaining of constipation for the last 3 to 4 weeks On semaglutide .  Recently had dose decreased to 1 mg weekly. No other associated symptoms.  HPI   Prior to Admission medications   Medication Sig Start Date End Date Taking? Authorizing Provider  aspirin  EC 81 MG tablet Take 1 tablet (81 mg total) by mouth daily. Swallow whole. 03/10/21  Yes Jeffrie Oneil BROCKS, MD  atorvastatin  (LIPITOR) 80 MG tablet TAKE 1 TABLET BY MOUTH EVERY DAY 05/18/23  Yes Jeffrie Oneil BROCKS, MD  cefadroxil  (DURICEF) 500 MG capsule Take 1 capsule (500 mg total) by mouth 2 (two) times daily. 08/19/23  Yes Burns, Glade PARAS, MD  Coenzyme Q10 (COQ-10 PO) Take 1 tablet by mouth daily.   Yes [provider]  finasteride (PROSCAR) 5 MG tablet Take 5 mg by mouth every morning.   Yes [provider]  irbesartan  (AVAPRO ) 150 MG tablet Take 1 tablet (150 mg total) by mouth daily. 12/31/22  Yes Jeffrie Oneil BROCKS, MD  linaclotide  (LINZESS ) 145 MCG CAPS capsule Take 1 capsule (145 mcg total) by mouth daily before breakfast. 10/12/23  Yes Ladarrion Telfair, Emil Schanz, MD  nebivolol  (BYSTOLIC ) 10 MG tablet TAKE 1 TABLET BY MOUTH EVERY DAY 05/18/23  Yes Jeffrie Oneil BROCKS, MD  Semaglutide -Weight Management (WEGOVY ) 1 MG/0.5ML SOAJ Inject 1 mg into the skin once a week. 10/04/23  Yes Jeffrie Oneil BROCKS, MD  tamsulosin (FLOMAX) 0.4 MG CAPS capsule tamsulosin 0.4 mg capsule  TAKE 1 CAPSULE BY MOUTH EVERY DAY   Yes [provider]    Allergies  Allergen Reactions   Vancomycin Hives, Itching and Rash   Metoprolol Anxiety and Other (See Comments)    Patient states, made me feel horrible.     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   Syncope 04/27/2018   Chronic fatigue 09/08/2017   Polyneuropathy 05/28/2016   Tubular adenoma of colon 09/23/2015   Obstructive sleep apnea (adult) (pediatric) 02/21/2015   Coronary atherosclerosis of native coronary artery 08/23/2013   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   Pure hypercholesterolemia 08/23/2013   Essential hypertension 12/29/2006   BENIGN PROSTATIC HYPERTROPHY 12/29/2006    Past Medical History:  Diagnosis Date   Coronary artery disease    GERD with apnea    Hyperlipidemia    Hypertension    Hypertrophy of prostate    Obesity    OSA on CPAP    Sleep apnea    uses CPAP    Past Surgical History:  Procedure Laterality Date   COLONOSCOPY  2016   [place], [place] =polyps   CORONARY ARTERY BYPASS GRAFT  2009   FOOT SURGERY     multiple   TOTAL HIP ARTHROPLASTY      Social History   Socioeconomic History   Marital status: Married    Spouse name: Aaron [name]    Number of children: Not on file   Years of education: Not  on file   Highest education level: Not on file  Occupational History   Occupation: RETIRED  Tobacco Use   Smoking status: Former   Smokeless tobacco: Never  Vaping Use   Vaping status: Never Used  Substance and Sexual Activity   Alcohol use: No   Drug use: No   Sexual activity: Not on file  Other Topics Concern   Not on file  Social History Narrative   Lives at home with wife.   Social Drivers of Corporate investment banker Strain: Low Risk  (05/26/2023)   Overall Financial Resource Strain (CARDIA)    Difficulty of Paying Living Expenses: Not hard at all  Food Insecurity: Low Risk  (07/05/2023)   Received from [facility]   Hunger Vital Sign    Within the past  12 months, you worried that your food would run out before you got money to buy more: Never true    Within the past 12 months, the food you bought just didn't last and you didn't have money to get more. : Never true  Transportation Needs: No Transportation Needs (07/05/2023)   Received from [facility]    In the past 12 months, has lack of reliable transportation kept you from medical appointments, meetings, work or from getting things needed for daily living? : No  Physical Activity: Inactive (05/26/2023)   Exercise Vital Sign    Days of Exercise per Week: 0 days    Minutes of Exercise per Session: 0 min  Stress: No Stress Concern Present (05/26/2023)   [employer] of Occupational Health - Occupational Stress Questionnaire    Feeling of Stress : Not at all  Social Connections: Moderately Integrated (05/26/2023)   Social Connection and Isolation Panel    Frequency of Communication with Friends and Family: More than three times a week    Frequency of Social Gatherings with Friends and Family: Never    Attends Religious Services: Never    Database administrator or Organizations: Yes    Attends Banker Meetings: 1 to 4 times per year    Marital Status: Married  Catering manager Violence: Not At Risk (05/26/2023)   Humiliation, Afraid, Rape, and Kick questionnaire    Fear of Current or Ex-Partner: No    Emotionally Abused: No    Physically Abused: No    Sexually Abused: No    Family History  Problem Relation Age of Onset   Hypertension Father    Colon cancer Neg Hx    Esophageal cancer Neg Hx    Stomach cancer Neg Hx    Rectal cancer Neg Hx      Review of Systems  Constitutional: Negative.  Negative for chills and fever.  HENT: Negative.  Negative for congestion and sore throat.   Respiratory: Negative.  Negative for cough and shortness of breath.   Cardiovascular: Negative.  Negative for chest pain and palpitations.  Gastrointestinal:  Positive  for constipation. Negative for abdominal pain, nausea and vomiting.  Genitourinary: Negative.  Negative for hematuria.  Neurological:  Negative for dizziness and headaches.  All other systems reviewed and are negative.   Vitals:   10/12/23 1039  BP: 120/80  Pulse: 60  Temp: 98.4 F (36.9 C)  SpO2: 93%    Physical Exam Vitals reviewed.  Constitutional:      Appearance: Normal appearance.  HENT:     Head: Normocephalic.  Eyes:     Extraocular Movements: Extraocular movements intact.  Cardiovascular:  Rate and Rhythm: Normal rate.  Pulmonary:     Effort: Pulmonary effort is normal.  Skin:    General: Skin is warm and dry.  Neurological:     Mental Status: He is alert and oriented to person, place, and time.  Psychiatric:        Behavior: Behavior normal.      ASSESSMENT & PLAN: A total of 32 minutes was spent with the patient and counseling/coordination of care regarding preparing for this visit, review of most recent office visit notes, review of all medications and chronic medical conditions under management, differential diagnosis of constipation and management, prognosis, documentation and need for follow-up with PCP.  Problem List Items Addressed This Visit       Digestive   Chronic constipation - Primary   Constipation of several weeks duration. Clinically stable.  No red flag signs or symptoms. Has tried most over-the-counter medications without relief Differential diagnosis discussed Colonoscopy report from 2021 reviewed.  It showed diverticulosis and a couple small polyps.  Otherwise unremarkable. No history of metabolic condition that may cause chronic constipation.  No thyroid condition. Presently on semaglutide .  Side effects discussed Diet and nutrition discussed.  Advised to stay well-hydrated We did not have a very good interaction today. Advised to follow-up with PCP and not schedule any more appointments with me.      Relevant Medications    linaclotide  (LINZESS ) 145 MCG CAPS capsule     Patient Instructions  Constipation, Adult Constipation is when a person has trouble pooping (having a bowel movement). When you have this condition, you may poop fewer than 3 times a week. Your poop (stool) may also be dry, hard, or bigger than normal. Follow these instructions at home: Eating and drinking  Eat foods that have a lot of fiber, such as: Fresh fruits and vegetables. Whole grains. Beans. Eat less of foods that are low in fiber and high in fat and sugar, such as: [place] fries. Hamburgers. Cookies. Candy. Soda. Drink enough fluid to keep your pee (urine) pale yellow. General instructions Exercise regularly or as told by your doctor. Try to do 150 minutes of exercise each week. Go to the restroom when you feel like you need to poop. Do not hold it in. Take over-the-counter and prescription medicines only as told by your doctor. These include any fiber supplements. When you poop: Do deep breathing while relaxing your lower belly (abdomen). Relax your pelvic floor. The pelvic floor is a group of muscles that support the rectum, bladder, and intestines (as well as the uterus in women). Watch your condition for any changes. Tell your doctor if you notice any. Keep all follow-up visits as told by your doctor. This is important. Contact a doctor if: You have pain that gets worse. You have a fever. You have not pooped for 4 days. You vomit. You are not hungry. You lose weight. You are bleeding from the opening of the butt (anus). You have thin, pencil-like poop. Get help right away if: You have a fever, and your symptoms suddenly get worse. You leak poop or have blood in your poop. Your belly feels hard or bigger than normal (bloated). You have very bad belly pain. You feel dizzy or you faint. Summary Constipation is when a person poops fewer than 3 times a week, has trouble pooping, or has poop that is dry, hard, or  bigger than normal. Eat foods that have a lot of fiber. Drink enough fluid to keep your pee (  urine) pale yellow. Take over-the-counter and prescription medicines only as told by your doctor. These include any fiber supplements. This information is not intended to replace advice given to you by your health care provider. Make sure you discuss any questions you have with your health care provider. Document Revised: 01/27/2022 Document Reviewed: 01/27/2022 Elsevier Patient Education  2024 Elsevier Inc.     Emil Schaumann, MD [facility]

## 2023-10-12 NOTE — Telephone Encounter (Signed)
 FYI Only or Action Required?: FYI only for provider.  Patient was last seen in primary care on 09/26/2023 by Geofm Glade PARAS, MD.  Called Nurse Triage reporting Constipation.  Symptoms began several weeks ago.  Interventions attempted: OTC medications: suppositories.  Symptoms are: gradually worsening.  Triage Disposition: See PCP When Office is Open (Within 3 Days)  Patient/caregiver understands and will follow disposition?: Yes     Copied from CRM [id number]. Topic: Clinical - Red Word Triage >> Oct 12, 2023  9:30 AM Henretta I wrote: Red Word that prompted transfer to Nurse Triage: Severe constipation, pain when trying to go to bathroom      Reason for Disposition  Unable to have a bowel movement (BM) without laxative or enema  Answer Assessment - Initial Assessment Questions 1. STOOL PATTERN OR FREQUENCY: How often do you have a bowel movement (BM)?  (Normal range: 3 times a day to every 3 days)  When was your last BM?       Yesterday 2. STRAINING: Do you have to strain to have a BM?      Yes 3. ONSET: When did the constipation begin?     3-4 weeks 4. RECTAL PAIN: Does your rectum hurt when the stool comes out? If Yes, ask: Do you have hemorrhoids? How bad is the pain?  (Scale 1-10; or mild, moderate, severe)     Yes 5. BM COMPOSITION: Are the stools hard?      Hard 6. BLOOD ON STOOLS: Has there been any blood on the toilet tissue or on the surface of the BM? If Yes, ask: When was the last time?     Small amount of light blood on toilet paper 7. CHRONIC CONSTIPATION: Is this a new problem for you?  If No, ask: How long have you had this problem? (days, weeks, months)      No 9. MEDICINES: Have you been taking any new medicines? Are you taking any narcotic pain medicines? (e.g., Dilaudid, morphine, Percocet, Vicodin)     No 10. LAXATIVES: Have you been using any stool softeners, laxatives, or enemas?  If Yes, ask What are you using, how often,  and when was the last time?       Suppository  12. CAUSE: What do you think is causing the constipation?        Believes may be due to Wegovy  14. OTHER SYMPTOMS: Do you have any other symptoms? (e.g., abdomen pain, bloating, fever, vomiting)       No  Protocols used: Constipation-A-AH

## 2023-10-24 ENCOUNTER — Ambulatory Visit: Admitting: Internal Medicine

## 2023-11-01 ENCOUNTER — Other Ambulatory Visit (HOSPITAL_COMMUNITY): Payer: Self-pay

## 2023-11-01 ENCOUNTER — Other Ambulatory Visit (HOSPITAL_BASED_OUTPATIENT_CLINIC_OR_DEPARTMENT_OTHER): Payer: Self-pay

## 2023-11-01 ENCOUNTER — Ambulatory Visit: Admitting: Podiatry

## 2023-11-02 ENCOUNTER — Other Ambulatory Visit (HOSPITAL_COMMUNITY): Payer: Self-pay

## 2023-11-04 ENCOUNTER — Other Ambulatory Visit (HOSPITAL_COMMUNITY): Payer: Self-pay

## 2023-11-05 ENCOUNTER — Other Ambulatory Visit: Payer: Self-pay | Admitting: Emergency Medicine

## 2023-11-05 DIAGNOSIS — K5909 Other constipation: Secondary | ICD-10-CM

## 2023-11-07 ENCOUNTER — Encounter: Payer: Self-pay | Admitting: Podiatry

## 2023-11-07 ENCOUNTER — Ambulatory Visit: Admitting: Podiatry

## 2023-11-07 VITALS — Ht 73.0 in | Wt 214.0 lb

## 2023-11-07 DIAGNOSIS — M2061 Acquired deformities of toe(s), unspecified, right foot: Secondary | ICD-10-CM

## 2023-11-07 DIAGNOSIS — M2062 Acquired deformities of toe(s), unspecified, left foot: Secondary | ICD-10-CM | POA: Diagnosis not present

## 2023-11-07 NOTE — Progress Notes (Signed)
  Subjective:  Patient ID: Aaron [name], male    DOB: 03-09-1951,  MRN: [id number]  Chief Complaint  Patient presents with   Wound Check    RM 23 1 m f/u wound check, patient is here wound check of right foot. Wound is closed with no redness or swelling. Patient is concern about left fifth toe.      73 y.o. male returns for follow-up   Review of Systems: Negative except as noted in the HPI. Denies N/V/F/Ch.   Objective:  There were no vitals filed for this visit. Body mass index is 28.23 kg/m. Constitutional Well developed. Well nourished.  Vascular Foot warm and well perfused. Capillary refill normal to all digits.  Calf is soft and supple, no posterior calf or knee pain, negative Homans' sign  Neurologic Normal speech. Oriented to person, place, and time. 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 Acquired deformity of left toe   2. Acquired deformity of right toe    Plan:  Patient was evaluated and treated and all questions answered.  Chronic right foot deformities and ulcer, Doing well and is fully healed.  May continue exercise as tolerated we also discussed swimming, elliptical use and playing golf.  Should be fine to do as long as he is in supportive shoes such as the sketchers he currently wears we also  looked at Ortho feet golf shoes as an option as well.  We discussed the toe deformities as well if the fifth toe on the left foot is an issue we could consider extensor tenotomy which I think may alleviate some of the contracture at least partially.  For the right second toe I do not think that this would alleviate the contracture and if ulcerating or threatening infection amputation would be the only option.  Return in 3 months to evaluate again or sooner if new issues develop or recur  Return in about 3 months (around 02/07/2024) for wound check.

## 2023-11-23 ENCOUNTER — Ambulatory Visit (HOSPITAL_BASED_OUTPATIENT_CLINIC_OR_DEPARTMENT_OTHER): Admitting: Nurse Practitioner

## 2023-11-23 ENCOUNTER — Encounter (HOSPITAL_BASED_OUTPATIENT_CLINIC_OR_DEPARTMENT_OTHER): Payer: Self-pay | Admitting: Nurse Practitioner

## 2023-11-23 VITALS — BP 106/71 | HR 79 | Ht 73.0 in | Wt 208.0 lb

## 2023-11-23 DIAGNOSIS — G4733 Obstructive sleep apnea (adult) (pediatric): Secondary | ICD-10-CM | POA: Diagnosis not present

## 2023-11-23 DIAGNOSIS — G4700 Insomnia, unspecified: Secondary | ICD-10-CM | POA: Insufficient documentation

## 2023-11-23 DIAGNOSIS — F5101 Primary insomnia: Secondary | ICD-10-CM

## 2023-11-23 NOTE — Patient Instructions (Signed)
 Given your symptoms and history, I am concerned that you still have sleep disordered breathing with sleep apnea. You will need a sleep study for further evaluation. Someone will contact you to schedule this.   We discussed how untreated sleep apnea puts an individual at risk for cardiac arrhthymias, pulm HTN, DM, stroke and increases their risk for daytime accidents. We also briefly reviewed treatment options including weight loss, side sleeping position, oral appliance, CPAP therapy or referral to ENT for possible surgical options  Use caution when driving and pull over if you become sleepy.  Follow up in 6 weeks with Katie Calistro Rauf,NP to go over sleep study results, or sooner, if needed. Friday PM virtual clinic preferred

## 2023-11-23 NOTE — Progress Notes (Signed)
 @Patient  ID: Aaron [name], male    DOB: 06/26/1950, 73 y.o.   MRN: [id number]  Chief Complaint  Patient presents with   Establish Care    New Sleep     Referring provider: Geofm Glade PARAS, MD  HPI: 73 year old male, former smoker referred for sleep consult. Past medical history significant for HTN, CAD s/p CABG, OSA, neuropathy, hx of prostate cancer, BPH, s/p bariatric surgery.   TEST/EVENTS:  2021 Titration: AHI 8.7/h, REM-related AHI 22.5/h >> CPAP 12 cmH2O  11/23/2023: Today - sleep consult Discussed the use of AI scribe software for clinical note transcription with the patient, who gave verbal consent to proceed.  History of Present Illness Aaron [name] is a 73 year old male with a history of sleep apnea who presents for a sleep consult.  He has a history of sleep apnea and was prescribed a CPAP machine years ago. He had a repeat sleep study about 4 years ago and got a new CPAP machine. This was after significant weight loss. He's not using his CPAP currently. His biggest complaint with it was that he would wake up with a very dry mouth and the water chamber would be empty. It became more of a frustration so he stopped but he is open to resuming therapy, if necessary.   His current sleep disturbances are multifactorial. 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 She is quadriplegic following a car accident three and a half years ago, and he often wakes up at night to assist her or just check on her, disrupting his sleep. He identifies as a 'night owl' but has adjusted his sleep schedule to accommodate her needs, leading to increased daytime fatigue and the need for naps.  He last had a sleep study in 2021 for evaluation of his sleep apnea. No issues with drowsy driving, morning headaches, or sleepwalking. Snoring was noted by his wife in the past.  He does not take any sleep medications and reports minimal alcohol intake. He lives with his wife and a live-in caregiver.    Goes to bed around 1130 pm. Falls asleep within 20 minutes. Wakes 1-2 times a night. Gets up around 8 am. No significant weight change in last two years. Unsure of his prior CPAP settings.       Allergies  Allergen Reactions   Vancomycin Hives, Itching and Rash   Metoprolol Anxiety and Other (See Comments)    Patient states, made me feel horrible.     Immunization History  Administered Date(s) Administered   Fluad Quad(high Dose 65+) 12/02/2018, 12/17/2021   Hep A, Unspecified 03/04/2015   INFLUENZA, HIGH DOSE SEASONAL PF 11/18/2017, 03/09/2018, 12/28/2022   Influenza, Seasonal, Injecte, Preservative Fre 04/13/2017   Influenza,inj,Quad PF,6+ Mos 02/02/2017   Influenza-Unspecified 03/16/2011, 12/12/2014, 02/18/2018, 02/19/2019, 01/01/2020, 01/18/2020, 12/27/2021   Moderna Covid-19 Fall Seasonal Vaccine 5yrs & older 12/28/2022   Moderna Sars-Cov-2 Peds vaccine 27yrs thru 18yrs 05/15/2019   Moderna Sars-Covid-2 Vaccination 04/16/2019, 05/15/2019, 01/18/2020   PNEUMOCOCCAL CONJUGATE-20 08/06/2021   Pneumococcal Conjugate-13 08/07/2014, 04/13/2017, 10/08/2018, 12/02/2018   Pneumococcal Polysaccharide-23 09/16/2017   Respiratory Syncytial Virus Vaccine,Recomb Aduvanted(Arexvy) 12/17/2021   Tdap 09/02/2014, 04/13/2017, 09/16/2017, 04/27/2018   Typhoid Inactivated 03/04/2015   Unspecified SARS-COV-2 Vaccination 12/17/2021   Yellow Fever 11/22/2015   Zoster Recombinant(Shingrix) 03/09/2018, 10/08/2018, 12/02/2018, 02/06/2020, 04/10/2020   Zoster, Live 03/30/2011, 01/20/2013    Past Medical History:  Diagnosis Date   Coronary artery disease    GERD with apnea  Hyperlipidemia    Hypertension    Hypertrophy of prostate    Obesity    OSA on CPAP    Sleep apnea    uses CPAP    Tobacco History: Social History   Tobacco Use  Smoking Status Former  Smokeless Tobacco Never   Counseling given: Not Answered   Outpatient Medications Prior to Visit  Medication Sig  Dispense Refill   aspirin  EC 81 MG tablet Take 1 tablet (81 mg total) by mouth daily. Swallow whole. 90 tablet 3   atorvastatin  (LIPITOR) 80 MG tablet TAKE 1 TABLET BY MOUTH EVERY DAY 90 tablet 2   cefadroxil  (DURICEF) 500 MG capsule Take 1 capsule (500 mg total) by mouth 2 (two) times daily. 180 capsule 1   Coenzyme Q10 (COQ-10 PO) Take 1 tablet by mouth daily.     finasteride (PROSCAR) 5 MG tablet Take 5 mg by mouth every morning.     irbesartan  (AVAPRO ) 150 MG tablet Take 1 tablet (150 mg total) by mouth daily. 90 tablet 3   linaclotide  (LINZESS ) 145 MCG CAPS capsule Take 1 capsule (145 mcg total) by mouth daily before breakfast. 30 capsule 1   nebivolol  (BYSTOLIC ) 10 MG tablet TAKE 1 TABLET BY MOUTH EVERY DAY 90 tablet 2   Semaglutide -Weight Management (WEGOVY ) 1 MG/0.5ML SOAJ Inject 1 mg into the skin once a week. 2 mL 5   tamsulosin (FLOMAX) 0.4 MG CAPS capsule tamsulosin 0.4 mg capsule  TAKE 1 CAPSULE BY MOUTH EVERY DAY     No facility-administered medications prior to visit.     Review of Systems:   Constitutional: No night sweats, fevers, chills, or lassitude. +fatigue, weight loss HEENT: No headaches CV:  No chest pain, orthopnea, PND, swelling in lower extremities Resp: +snoring GI:  No heartburn, indigestion GU: No nocturia  Skin: No rash, lesions, ulcerations MSK:  No joint pain or swelling.   Neuro: No memory impairment   Psych: No depression or anxiety. Mood stable. +sleep disturbance, stress      Physical Exam:  BP 106/71 (BP Location: Left Arm, Patient Position: Sitting)   Pulse 79   Ht 6' 1 (1.854 m)   Wt 208 lb (94.3 kg)   SpO2 99%   BMI 27.44 kg/m   GEN: Pleasant, interactive, well-appearing; in no acute distress HEENT:  Normocephalic and atraumatic. PERRLA. Sclera white. Nasal turbinates pink, moist and patent bilaterally. No rhinorrhea present. Oropharynx pink and moist, without exudate or edema. No lesions, ulcerations, or postnasal drip. Mallampati  III NECK:  Supple w/ fair ROM. No JVD present.Thyroid symmetrical with no goiter or nodules palpated. No lymphadenopathy.   CV: RRR, no m/r/g PULMONARY:  Unlabored, regular breathing. Clear bilaterally A&P w/o wheezes/rales/rhonchi. No accessory muscle use.  GI: BS present and normoactive. Soft, non-tender to palpation.  MSK: No erythema, warmth or tenderness. Cap refil <2 sec all extrem.  Neuro: A/Ox3. No focal deficits noted.   Skin: Warm, no lesions or rashe Psych: Normal affect and behavior. Judgement and thought content appropriate.     Lab Results:  CBC    Component Value Date/Time   WBC 6.3 09/26/2023 1605   RBC 4.15 (L) 09/26/2023 1605   HGB 12.8 (L) 09/26/2023 1605   HGB 13.7 07/18/2023 1502   HCT 38.4 (L) 09/26/2023 1605   HCT 39.6 07/18/2023 1502   PLT 246.0 09/26/2023 1605   PLT 261 07/18/2023 1502   MCV 92.5 09/26/2023 1605   MCV 92 07/18/2023 1502   MCH 31.9 07/18/2023 1502  MCHC 33.3 09/26/2023 1605   RDW 14.9 09/26/2023 1605   RDW 12.5 07/18/2023 1502   LYMPHSABS 1.4 09/26/2023 1605   LYMPHSABS 1.5 07/18/2023 1502   MONOABS 0.7 09/26/2023 1605   EOSABS 0.2 09/26/2023 1605   EOSABS 0.1 07/18/2023 1502   BASOSABS 0.1 09/26/2023 1605   BASOSABS 0.1 07/18/2023 1502    BMET    Component Value Date/Time   NA 140 09/26/2023 1605   NA 141 07/18/2023 1502   K 4.3 09/26/2023 1605   CL 107 09/26/2023 1605   CO2 26 09/26/2023 1605   GLUCOSE 72 09/26/2023 1605   BUN 21 09/26/2023 1605   BUN 17 07/18/2023 1502   CREATININE 0.93 09/26/2023 1605   CALCIUM  9.2 09/26/2023 1605   GFRNONAA 68 09/06/2019 1642   GFRAA 78 09/06/2019 1642    BNP No results found for: BNP   Imaging:  No results found.  Administration History     None           No data to display          No results found for: NITRICOXIDE      Assessment & Plan:   Mild obstructive sleep apnea Hx of OSA, currently off CPAP. Sleep disturbances seem multifactorial;  however, discuss how untreated OSA can impact sleep quality and restfulness. Suspicion for OSA remains high. Will repeat HST to reassess severity. If he does have OSA, he would be willing to resume CPAP therapy. Suspect he may need mask/pressure/humidity adjustments based on issues with use previously.  - discussed how weight can impact sleep and risk for sleep disordered breathing - discussed options to assist with weight loss: combination of diet modification, cardiovascular and strength training exercises   - had an extensive discussion regarding the adverse health consequences related to untreated sleep disordered breathing - specifically discussed the risks for hypertension, coronary artery disease, cardiac dysrhythmias, cerebrovascular disease, and diabetes - lifestyle modification discussed   - discussed how sleep disruption can increase risk of accidents, particularly when driving - safe driving practices were discussed  Patient Instructions  Given your symptoms and history, I am concerned that you still have sleep disordered breathing with sleep apnea. You will need a sleep study for further evaluation. Someone will contact you to schedule this.   We discussed how untreated sleep apnea puts an individual at risk for cardiac arrhthymias, pulm HTN, DM, stroke and increases their risk for daytime accidents. We also briefly reviewed treatment options including weight loss, side sleeping position, oral appliance, CPAP therapy or referral to ENT for possible surgical options  Use caution when driving and pull over if you become sleepy.  Follow up in 6 weeks with Katie Schwanda Zima,NP to go over sleep study results, or sooner, if needed. Friday PM virtual clinic preferred       Insomnia See above. Sleep hygiene reviewed  Advised if symptoms do not improve or worsen, to please contact office for sooner follow up or seek emergency care.   I spent 35 minutes of dedicated to the care of this  patient on the date of this encounter to include pre-visit review of records, face-to-face time with the patient discussing conditions above, post visit ordering of testing, clinical documentation with the electronic health record, making appropriate referrals as documented, and communicating necessary findings to members of the patients care team.  Comer LULLA Rouleau, NP 11/23/2023  Pt aware and understands NP's role.

## 2023-11-23 NOTE — Assessment & Plan Note (Signed)
 Hx of OSA, currently off CPAP. Sleep disturbances seem multifactorial; however, discuss how untreated OSA can impact sleep quality and restfulness. Suspicion for OSA remains high. Will repeat HST to reassess severity. If he does have OSA, he would be willing to resume CPAP therapy. Suspect he may need mask/pressure/humidity adjustments based on issues with use previously.  - discussed how weight can impact sleep and risk for sleep disordered breathing - discussed options to assist with weight loss: combination of diet modification, cardiovascular and strength training exercises   - had an extensive discussion regarding the adverse health consequences related to untreated sleep disordered breathing - specifically discussed the risks for hypertension, coronary artery disease, cardiac dysrhythmias, cerebrovascular disease, and diabetes - lifestyle modification discussed   - discussed how sleep disruption can increase risk of accidents, particularly when driving - safe driving practices were discussed  Patient Instructions  Given your symptoms and history, I am concerned that you still have sleep disordered breathing with sleep apnea. You will need a sleep study for further evaluation. Someone will contact you to schedule this.   We discussed how untreated sleep apnea puts an individual at risk for cardiac arrhthymias, pulm HTN, DM, stroke and increases their risk for daytime accidents. We also briefly reviewed treatment options including weight loss, side sleeping position, oral appliance, CPAP therapy or referral to ENT for possible surgical options  Use caution when driving and pull over if you become sleepy.  Follow up in 6 weeks with Katie Starlit Raburn,NP to go over sleep study results, or sooner, if needed. Friday PM virtual clinic preferred

## 2023-11-23 NOTE — Assessment & Plan Note (Signed)
See above. Sleep hygiene reviewed.

## 2023-11-27 ENCOUNTER — Other Ambulatory Visit: Payer: Self-pay | Admitting: Emergency Medicine

## 2023-11-27 ENCOUNTER — Encounter: Payer: Self-pay | Admitting: Cardiology

## 2023-11-27 DIAGNOSIS — K5909 Other constipation: Secondary | ICD-10-CM

## 2023-11-29 ENCOUNTER — Other Ambulatory Visit: Payer: Self-pay

## 2023-11-29 ENCOUNTER — Encounter (HOSPITAL_COMMUNITY): Payer: Self-pay

## 2023-11-29 ENCOUNTER — Other Ambulatory Visit (HOSPITAL_COMMUNITY): Payer: Self-pay

## 2023-12-05 ENCOUNTER — Other Ambulatory Visit (HOSPITAL_COMMUNITY): Payer: Self-pay

## 2023-12-05 MED ORDER — WEGOVY 1.7 MG/0.75ML ~~LOC~~ SOAJ
1.7000 mg | SUBCUTANEOUS | 0 refills | Status: DC
  Filled 2023-12-05: qty 3, 28d supply, fill #0

## 2023-12-06 ENCOUNTER — Other Ambulatory Visit (HOSPITAL_COMMUNITY): Payer: Self-pay

## 2023-12-06 ENCOUNTER — Other Ambulatory Visit: Payer: Self-pay | Admitting: Internal Medicine

## 2023-12-06 DIAGNOSIS — K5909 Other constipation: Secondary | ICD-10-CM

## 2023-12-06 MED ORDER — LINACLOTIDE 145 MCG PO CAPS
145.0000 ug | ORAL_CAPSULE | Freq: Every day | ORAL | 1 refills | Status: AC
  Filled 2023-12-06: qty 90, 90d supply, fill #0
  Filled 2024-01-10 – 2024-02-27 (×2): qty 90, 90d supply, fill #1

## 2023-12-09 ENCOUNTER — Ambulatory Visit: Attending: Cardiology | Admitting: Cardiology

## 2023-12-09 ENCOUNTER — Encounter: Payer: Self-pay | Admitting: Cardiology

## 2023-12-09 VITALS — BP 110/70 | HR 71 | Ht 73.0 in | Wt 199.0 lb

## 2023-12-09 DIAGNOSIS — E7800 Pure hypercholesterolemia, unspecified: Secondary | ICD-10-CM

## 2023-12-09 DIAGNOSIS — I2510 Atherosclerotic heart disease of native coronary artery without angina pectoris: Secondary | ICD-10-CM | POA: Diagnosis not present

## 2023-12-09 DIAGNOSIS — R0609 Other forms of dyspnea: Secondary | ICD-10-CM | POA: Diagnosis not present

## 2023-12-09 DIAGNOSIS — Z79899 Other long term (current) drug therapy: Secondary | ICD-10-CM | POA: Diagnosis not present

## 2023-12-09 NOTE — Patient Instructions (Addendum)
 Medication Instructions:  The current medical regimen is effective;  continue present plan and medications.  *If you need a refill on your cardiac medications before your next appointment, please call your pharmacy*  Lab Work: Please have Lipid Panel today.  If you have labs (blood work) drawn today and your tests are completely normal, you will receive your results only by: MyChart Message (if you have MyChart) OR A paper copy in the mail If you have any lab test that is abnormal or we need to change your treatment, we will call you to review the results.  Testing/Procedures: Your physician has requested that you have an echocardiogram. Echocardiography is a painless test that uses sound waves to create images of your heart. It provides your doctor with information about the size and shape of your heart and how well your heart's chambers and valves are working. This procedure takes approximately one hour. There are no restrictions for this procedure. Please do NOT wear cologne, perfume, aftershave, or lotions (deodorant is allowed). Please arrive 15 minutes prior to your appointment time.  Please note: We ask at that you not bring children with you during ultrasound (echo/ vascular) testing. Due to room size and safety concerns, children are not allowed in the ultrasound rooms during exams. Our front office staff cannot provide observation of children in our lobby area while testing is being conducted. An adult accompanying a patient to their appointment will only be allowed in the ultrasound room at the discretion of the ultrasound technician under special circumstances. We apologize for any inconvenience.  You are scheduled for a Lexiscan  Myocardial Perfusion Imaging Study on      at        .   Please arrive 15 minutes prior to your appointment time for registration and insurance purposes.   The test will take approximately 3 to 4 hours to complete; you may bring reading material. If  someone comes with you to your appointment, they will need to remain in the main lobby due to limited space in the testing area.   How to prepare for your Lexiscan  Myocardial Perfusion test:   Do not eat or drink 3 hours prior to your test, except you may have water.    Do not consume products containing caffeine (regular or decaffeinated) 12 hours prior to your test (ex: coffee, chocolate, soda, tea)   Do bring a list of your current medications with you. If not listed below, you may take your medications as normal.   Bring any held medication to your appointment, as you may be required to take it once the test is complete.   Do wear comfortable clothes (no dresses or overalls) and walking shoes. Tennis shoes are preferred. No heels or open toed shoes.  Do not wear cologne, perfume, aftershave or lotions (deodorant is allowed).   If these instructions are not followed, you test will have to be rescheduled.   Please report to [address], [place] for your test. If you have questions or concerns about your appointment, please call the Nuclear Lab at #[phone].  If you cannot keep your appointment, please provide 24 hour notification to the Nuclear lab to avoid a possible $50 charge to your account.     Follow-Up: At [facility], you and your health needs are our priority.  As part of our continuing mission to provide you with exceptional heart care, our providers are all part of one team.  This team includes your primary Cardiologist (  physician) and Advanced Practice Providers or APPs (Physician Assistants and Nurse Practitioners) who all work together to provide you with the care you need, when you need it.  Your next appointment:   1 year(s)  Provider:   Oneil Parchment, MD    We recommend signing up for the patient portal called MyChart.  Sign up information is provided on this After Visit Summary.  MyChart is used to connect with patients for Virtual Visits  (Telemedicine).  Patients are able to view lab/test results, encounter notes, upcoming appointments, etc.  Non-urgent messages can be sent to your provider as well.   To learn more about what you can do with MyChart, go to [email or web address].

## 2023-12-09 NOTE — Progress Notes (Signed)
 Cardiology Office Note:  .   Date:  12/09/2023  ID:  Aaron [name], DOB [birth year], MRN [id number] PCP: Geofm Glade PARAS, MD  [facility] Providers Cardiologist:  Oneil Parchment, MD    History of Present Illness: .     History of Present Illness Aaron [name] is a 73 year old male with coronary artery disease who presents with shortness of breath and weakness.  He experiences shortness of breath and weakness, particularly in the mornings. He describes feeling 'a little bit short of breath' and is unsure of the cause. He gets tired after being up for a while and is unsure why. No angina-like discomfort is reported.  He has a significant family history of heart disease, noting that his brother died of a heart attack at the age of 2. Heart attacks are common on both his mother's and father's sides of the family.  He underwent coronary artery bypass grafting (CABG) in 2009 and has been on atorvastatin  80 mg and aspirin  since then. His blood pressure has been low, sometimes around 100/70, and he is taking irbesartan  150 mg.  He has lost nearly 150 pounds while on Wegovy , which has significantly reduced his obesity. 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 Initially advised to amputate, he sought a second opinion and underwent a procedure in [place]  to shave off part of the bone, which has since healed. 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    Studies Reviewed: .        Results LABS LDL: 71 mg/dL (7978)  DIAGNOSTIC Echocardiogram: Normal perfusion image (2024) Risk Assessment/Calculations:            Physical Exam:   VS:  BP 110/70   Pulse 71   Ht 6' 1 (1.854 m)   Wt 199 lb (90.3 kg)   SpO2 96%   BMI 26.25 kg/m    Wt Readings from Last 3 Encounters:   12/09/23 199 lb (90.3 kg)  11/23/23 208 lb (94.3 kg)  11/07/23 214 lb (97.1 kg)    GEN: Well nourished, well developed in no acute distress NECK: No JVD; No carotid bruits CARDIAC: RRR, no murmurs, no rubs, no gallops RESPIRATORY:  Clear to auscultation without rales, wheezing or rhonchi  ABDOMEN: Soft, non-tender, non-distended EXTREMITIES:  No edema; No deformity   ASSESSMENT AND PLAN: .    Assessment and Plan Assessment & Plan Shortness of breath and fatigue, under evaluation Intermittent shortness of breath and fatigue, possibly related to musculoskeletal issues or weakness from weight loss. Differential includes cardiac causes given coronary artery disease and recent significant weight loss. - Order echocardiogram to assess cardiac function - Order nuclear stress test to evaluate blood flow - Encourage strength training to improve muscle mass and physical energy  Coronary artery disease, post-CABG Coronary artery disease with CABG in 2009. Recent concerns about potential changes in bypass grafts. LDL levels need to be monitored to ensure he remains below 70 mg/dL. - Order echocardiogram and nuclear stress test to assess current cardiac status - Check recent lipid panel to evaluate LDL levels - Continue atorvastatin  80 mg daily - Continue aspirin  81 mg daily  Hypertension Blood pressure has been low, possibly due to significant weight loss. Current medications include irbesartan  and nebivolol . -  Consider adjusting antihypertensive medications if blood pressure remains low  Hyperlipidemia LDL levels need to be monitored to ensure he remains below 70 mg/dL. Current treatment with atorvastatin  80 mg daily. - Check recent lipid panel to evaluate LDL levels - Continue atorvastatin  80 mg daily  Obesity, now improved with significant weight loss Significant weight loss of nearly 150 pounds with the use of Wegovy . Weight loss has contributed to improved health status but may have  led to muscle loss. - Encourage continued weight management - Encourage strength training to improve muscle mass  Age-related muscle loss (sarcopenia) Significant weight loss has likely contributed to muscle loss, leading to weakness and fatigue. - Encourage strength training to improve muscle mass and physical energy         Dispo: 1 yr  Signed, Oneil Parchment, MD

## 2023-12-16 ENCOUNTER — Ambulatory Visit: Payer: Self-pay

## 2023-12-16 ENCOUNTER — Ambulatory Visit: Admitting: Internal Medicine

## 2023-12-16 NOTE — Telephone Encounter (Signed)
 Message left for patient today to return call to clinic and schedule appointment if he is still having swelling.

## 2023-12-16 NOTE — Telephone Encounter (Signed)
 FYI Only or Action Required?: Action required by provider: request for appointment and update on patient condition.  Patient was last seen in primary care on 10/12/2023 by Purcell Emil Schanz, MD.  Called Nurse Triage reporting Facial Swelling and Otalgia.  Symptoms began today.  Interventions attempted: Nothing.  Symptoms are: gradually worsening.  Triage Disposition: See Physician Within 24 Hours  Patient/caregiver understands and will follow disposition?: Yes, but will wait                             Copied from CRM #[id number]. Topic: Clinical - Red Word Triage >> Dec 16, 2023  9:23 AM Adelita E wrote: [facility] that prompted transfer to Nurse Triage: Ear pain. Patient stated he woke up with right ear pain, swelling, and redness. Reason for Disposition  Earache  (Exceptions: Brief ear pain of lasting less than 60 minutes, or earache occurring during air travel.)  Answer Assessment - Initial Assessment Questions 1. LOCATION: Which ear is involved?     Right ear 2. ONSET: When did the ear pain start?      This morning 3. SEVERITY: How bad is the pain?  (Scale 1-10; mild, moderate or severe)     Rates pain a 2 4. URI SYMPTOMS: Do you have a runny nose or cough?     Nothing worse than normal 5. FEVER: Do you have a fever? If Yes, ask: What is your temperature, how was it measured, and when did it start?     Denies 6. CAUSE: Have you been swimming recently?, How often do you use Q-TIPS?, Have you had any recent air travel or scuba diving?     Unsure 7. OTHER SYMPTOMS: Do you have any other symptoms? (e.g., decreased hearing, dizziness, headache, stiff neck, vomiting)     Moderate swelling, redness, muffled hearing, denies tinnitus, denies dizziness, denies headache, denies difficulty breathing, denies difficulty swallowing, denies loss of sensation    Advised patient to be evaluated today. No availability in PCP office or other  offices in the region. Advised UC. Patient expressed extreme frustration with the [facility] system. Patient expressed that he has not had a good experience with [facility] and does not understand why he cannot be seen in his PCP office. Patient has been added to the wait list for today. Please advise.  Protocols used: Rilla

## 2024-01-05 ENCOUNTER — Encounter

## 2024-01-05 DIAGNOSIS — G4733 Obstructive sleep apnea (adult) (pediatric): Secondary | ICD-10-CM

## 2024-01-06 ENCOUNTER — Ambulatory Visit: Admitting: Nurse Practitioner

## 2024-01-06 ENCOUNTER — Telehealth: Payer: Self-pay | Admitting: *Deleted

## 2024-01-06 NOTE — Telephone Encounter (Signed)
 Called and spoke with patient, advised that we are canceling his follow appointment since the HST has not been completed.  Advised we would call to reschedule once we get the results.  He verbalized understanding.  Nothing further needed.

## 2024-01-10 ENCOUNTER — Other Ambulatory Visit (HOSPITAL_COMMUNITY): Payer: Self-pay

## 2024-01-10 ENCOUNTER — Other Ambulatory Visit: Payer: Self-pay | Admitting: Cardiology

## 2024-01-10 ENCOUNTER — Encounter (HOSPITAL_COMMUNITY): Payer: Self-pay | Admitting: *Deleted

## 2024-01-10 ENCOUNTER — Ambulatory Visit: Payer: Self-pay | Admitting: Cardiology

## 2024-01-10 LAB — LIPID PANEL
Chol/HDL Ratio: 2.5 ratio (ref 0.0–5.0)
Cholesterol, Total: 143 mg/dL (ref 100–199)
HDL: 58 mg/dL (ref >39)
LDL Chol Calc (NIH): 64 mg/dL (ref 0–99)
Triglycerides: 115 mg/dL (ref 0–149)
VLDL Cholesterol Cal: 21 mg/dL (ref 5–40)

## 2024-01-11 ENCOUNTER — Other Ambulatory Visit (HOSPITAL_COMMUNITY): Payer: Self-pay

## 2024-01-11 ENCOUNTER — Other Ambulatory Visit: Payer: Self-pay

## 2024-01-11 MED ORDER — WEGOVY 1.7 MG/0.75ML ~~LOC~~ SOAJ
1.7000 mg | SUBCUTANEOUS | 3 refills | Status: AC
  Filled 2024-01-11: qty 3, 28d supply, fill #0
  Filled 2024-02-09: qty 3, 28d supply, fill #1
  Filled 2024-03-16: qty 3, 28d supply, fill #2
  Filled 2024-04-19: qty 3, 28d supply, fill #0

## 2024-01-16 ENCOUNTER — Other Ambulatory Visit (HOSPITAL_COMMUNITY): Payer: Self-pay

## 2024-01-17 ENCOUNTER — Other Ambulatory Visit: Payer: Self-pay | Admitting: Cardiology

## 2024-01-17 DIAGNOSIS — I2510 Atherosclerotic heart disease of native coronary artery without angina pectoris: Secondary | ICD-10-CM

## 2024-01-17 DIAGNOSIS — R0609 Other forms of dyspnea: Secondary | ICD-10-CM

## 2024-01-18 ENCOUNTER — Ambulatory Visit (HOSPITAL_COMMUNITY)
Admission: RE | Admit: 2024-01-18 | Discharge: 2024-01-18 | Disposition: A | Discharge status: Home / Self Care | Source: Ambulatory Visit | Attending: Cardiology | Admitting: Cardiology

## 2024-01-18 ENCOUNTER — Ambulatory Visit (HOSPITAL_BASED_OUTPATIENT_CLINIC_OR_DEPARTMENT_OTHER)
Admission: RE | Admit: 2024-01-18 | Discharge: 2024-01-18 | Disposition: A | Discharge status: Home / Self Care | Source: Ambulatory Visit | Attending: Cardiology | Admitting: Cardiology

## 2024-01-18 DIAGNOSIS — I2510 Atherosclerotic heart disease of native coronary artery without angina pectoris: Secondary | ICD-10-CM | POA: Insufficient documentation

## 2024-01-18 DIAGNOSIS — R0609 Other forms of dyspnea: Secondary | ICD-10-CM | POA: Diagnosis present

## 2024-01-18 LAB — MYOCARDIAL PERFUSION IMAGING
LV dias vol: 111 mL (ref 62–150)
LV sys vol: 41 mL (ref 4.2–5.8)
Nuc Stress EF: 63 %
Peak HR: 80 {beats}/min
Rest HR: 71 {beats}/min
Rest Nuclear Isotope Dose: 10.2 mCi
SDS: 0
SRS: 6
SSS: 4
ST Depression (mm): 0 mm
Stress Nuclear Isotope Dose: 31.1 mCi
TID: 0.99

## 2024-01-18 LAB — ECHOCARDIOGRAM COMPLETE: S' Lateral: 2.59 cm

## 2024-01-18 MED ORDER — REGADENOSON 0.4 MG/5ML IV SOLN
0.4000 mg | Freq: Once | INTRAVENOUS | Status: AC
  Administered 2024-01-18: 0.4 mg via INTRAVENOUS

## 2024-01-18 MED ORDER — REGADENOSON 0.4 MG/5ML IV SOLN
INTRAVENOUS | Status: AC
Start: 2024-01-18 — End: 2024-01-18
  Filled 2024-01-18: qty 5

## 2024-01-18 MED ORDER — TECHNETIUM TC 99M TETROFOSMIN IV KIT
31.1000 | PACK | Freq: Once | INTRAVENOUS | Status: AC | PRN
Start: 2024-01-18 — End: 2024-01-18
  Administered 2024-01-18: 31.1 via INTRAVENOUS

## 2024-01-18 MED ORDER — TECHNETIUM TC 99M TETROFOSMIN IV KIT
10.2000 | PACK | Freq: Once | INTRAVENOUS | Status: AC | PRN
  Administered 2024-01-18: 10.2 via INTRAVENOUS

## 2024-01-20 ENCOUNTER — Ambulatory Visit: Payer: Self-pay | Admitting: Cardiology

## 2024-01-20 ENCOUNTER — Encounter: Payer: Self-pay | Admitting: Cardiology

## 2024-01-22 ENCOUNTER — Telehealth: Payer: Self-pay | Admitting: Pulmonary Disease

## 2024-01-22 DIAGNOSIS — G4733 Obstructive sleep apnea (adult) (pediatric): Secondary | ICD-10-CM | POA: Diagnosis not present

## 2024-01-22 NOTE — Telephone Encounter (Signed)
 Call patient  Sleep study result  Date of study: 01/05/2024  Impression: Mild obstructive sleep apnea with moderate oxygen desaturations. AHI of 11.3, O2 nadir of 80% Saturations below 88% for 28 minutes  Recommendation: Options for treating mild obstructive sleep apnea may include CPAP therapy if there are significant daytime symptoms or notable comorbidities. Auto CPAP 5-15 with heated humidification and the patient's preferred mask may be considered; other treatment options may include an oral device, watchful waiting with significant weight loss efforts.  Close clinical follow-up for optimization of treatment

## 2024-01-23 NOTE — Telephone Encounter (Signed)
 Called patient,patient would like office visit to discuss all options scheduled to see [email or web address]

## 2024-01-26 ENCOUNTER — Encounter: Payer: Self-pay | Admitting: Cardiology

## 2024-01-30 ENCOUNTER — Encounter: Payer: Self-pay | Admitting: Radiology

## 2024-02-01 ENCOUNTER — Encounter: Payer: Self-pay | Admitting: Nurse Practitioner

## 2024-02-01 ENCOUNTER — Telehealth: Admitting: Nurse Practitioner

## 2024-02-01 VITALS — Ht 75.0 in | Wt 195.0 lb

## 2024-02-01 DIAGNOSIS — G4733 Obstructive sleep apnea (adult) (pediatric): Secondary | ICD-10-CM | POA: Diagnosis not present

## 2024-02-01 DIAGNOSIS — G4700 Insomnia, unspecified: Secondary | ICD-10-CM

## 2024-02-01 DIAGNOSIS — Z87891 Personal history of nicotine dependence: Secondary | ICD-10-CM

## 2024-02-01 DIAGNOSIS — F5101 Primary insomnia: Secondary | ICD-10-CM

## 2024-02-01 NOTE — Assessment & Plan Note (Signed)
See above. Sleep hygiene reviewed.

## 2024-02-01 NOTE — Patient Instructions (Addendum)
 Your repeat sleep study showed mild sleep apnea. We discussed that there are minimal cardiovascular risks associated with mild sleep apnea; however, it can impact your overall sleep quality and result in daytime fatigue. We also briefly reviewed treatment options including weight loss, side sleeping position, oral appliance, CPAP therapy   Inspire device would not be an option as this is reserved for moderate to severe sleep apnea.   You would like to see if an oral appliance is an option. I have sent a referral for this. Let me know if you don't hear from someone in the next few weeks.  If the oral appliance isn't affordable, we can see about getting you a new CPAP machine and putting you in the lab overnight to get you more comfortable on therapy.   Follow up in 6 months with any sleep provider, or sooner, if needed

## 2024-02-01 NOTE — Progress Notes (Signed)
 @Patient  ID: Aaron [name], male    DOB: [birth year], 73 y.o.   MRN: [id number]  Chief Complaint  Patient presents with   Medical Management of Chronic Issues    F/u for hst     Referring provider: Geofm Glade PARAS, MD  Virtual Visit via Video Note  I connected with Aaron [name] on 02/01/24 at 10:00 AM EST by a video enabled telemedicine application and verified that I am speaking with the correct person using two identifiers.  Location: Patient: Home Provider: Office   I discussed the limitations of evaluation and management by telemedicine and the availability of in person appointments. The patient expressed understanding and agreed to proceed.  History of Present Illness: 73 year old male, former smoker followed for mild OSA and insomnia. Past medical history significant for HTN, CAD s/p CABG, OSA, neuropathy, hx of prostate cancer, BPH, s/p bariatric surgery.   TEST/EVENTS:  2021 Titration: AHI 8.7/h, REM-related AHI 22.5/h >> CPAP 12 cmH2O 01/05/2024 HST: AHI 11.3, SpO2 low 80%  11/23/2023: OV with [name] is a 73 year old male with a history of sleep apnea who presents for a sleep consult. He has a history of sleep apnea and was prescribed a CPAP machine years ago. He had a repeat sleep study about 4 years ago and got a new CPAP machine. This was after significant weight loss. He's not using his CPAP currently. His biggest complaint with it was that he would wake up with a very dry mouth and the water chamber would be empty. It became more of a frustration so he stopped but he is open to resuming therapy, if necessary.  His current sleep disturbances are multifactorial. 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 She is quadriplegic following a car accident three and a half years ago, and he often wakes up at night to assist her or just check on her, disrupting his sleep. He identifies as a 'night owl' but has adjusted his sleep schedule to accommodate  her needs, leading to increased daytime fatigue and the need for naps. He last had a sleep study in 2021 for evaluation of his sleep apnea. No issues with drowsy driving, morning headaches, or sleepwalking. Snoring was noted by his wife in the past. He does not take any sleep medications and reports minimal alcohol intake. He lives with his wife and a live-in caregiver.  Goes to bed around 1130 pm. Falls asleep within 20 minutes. Wakes 1-2 times a night. Gets up around 8 am. No significant weight change in last two years. Unsure of his prior CPAP settings.    02/01/2024: Today - follow up Discussed the use of AI scribe software for clinical note transcription with the patient, who gave verbal consent to proceed.  History of Present Illness  [name] is a 73 year old male with mild sleep apnea who presents with sleep disturbances and fatigue.  He had a repeat sleep study with mild sleep apnea present. He has a CPAP at home but doesn't use it due to difficulties and continued daytime fatigue despite use. He has a lot of difficulties with his nighttime sleep, which is multifactorial. Feels unchanged compared to his last visit. No drowsy driving.   He inquires about alternative treatments.     Allergies  Allergen Reactions   Vancomycin Hives, Itching and Rash   Metoprolol Anxiety and Other (See Comments)    Patient states, made me feel horrible.  Immunization History  Administered Date(s) Administered   Fluad Quad(high Dose 65+) 12/02/2018, 12/17/2021   Hep A, Unspecified 03/04/2015   INFLUENZA, HIGH DOSE SEASONAL PF 11/18/2017, 03/09/2018, 12/28/2022   Influenza, Seasonal, Injecte, Preservative Fre 04/13/2017   Influenza,inj,Quad PF,6+ Mos 02/02/2017   Influenza-Unspecified 03/16/2011, 12/12/2014, 02/18/2018, 02/19/2019, 01/01/2020, 01/18/2020, 12/27/2021   Moderna Covid-19 Fall Seasonal Vaccine 20yrs & older 12/28/2022   Moderna Sars-Cov-2 Peds vaccine 35yrs thru  18yrs 05/15/2019   Moderna Sars-Covid-2 Vaccination 04/16/2019, 05/15/2019, 01/18/2020   PNEUMOCOCCAL CONJUGATE-20 08/06/2021   Pneumococcal Conjugate-13 08/07/2014, 04/13/2017, 10/08/2018, 12/02/2018   Pneumococcal Polysaccharide-23 09/16/2017   Respiratory Syncytial Virus Vaccine,Recomb Aduvanted(Arexvy) 12/17/2021   Tdap 09/02/2014, 04/13/2017, 09/16/2017, 04/27/2018   Typhoid Inactivated 03/04/2015   Unspecified SARS-COV-2 Vaccination 12/17/2021   Yellow Fever 11/22/2015   Zoster Recombinant(Shingrix) 03/09/2018, 10/08/2018, 12/02/2018, 02/06/2020, 04/10/2020   Zoster, Live 03/30/2011, 01/20/2013    Past Medical History:  Diagnosis Date   Coronary artery disease    GERD with apnea    Hyperlipidemia    Hypertension    Hypertrophy of prostate    Obesity    OSA on CPAP    Sleep apnea    uses CPAP    Tobacco History: Social History   Tobacco Use  Smoking Status Former  Smokeless Tobacco Never   Counseling given: Not Answered   Outpatient Medications Prior to Visit  Medication Sig Dispense Refill   aspirin  EC 81 MG tablet Take 1 tablet (81 mg total) by mouth daily. Swallow whole. 90 tablet 3   atorvastatin  (LIPITOR) 80 MG tablet TAKE 1 TABLET BY MOUTH EVERY DAY 90 tablet 2   cefadroxil  (DURICEF) 500 MG capsule Take 1 capsule (500 mg total) by mouth 2 (two) times daily. 180 capsule 1   Coenzyme Q10 (COQ-10 PO) Take 1 tablet by mouth daily.     finasteride (PROSCAR) 5 MG tablet Take 5 mg by mouth every morning.     irbesartan  (AVAPRO ) 150 MG tablet Take 1 tablet (150 mg total) by mouth daily. 90 tablet 3   linaclotide  (LINZESS ) 145 MCG CAPS capsule Take 1 capsule (145 mcg total) by mouth daily before breakfast. 90 capsule 1   nebivolol  (BYSTOLIC ) 10 MG tablet TAKE 1 TABLET BY MOUTH EVERY DAY 90 tablet 2   semaglutide -weight management (WEGOVY ) 1.7 MG/0.75ML SOAJ SQ injection Inject 1.7 mg into the skin every 7 (seven) days. 3 mL 3   tamsulosin (FLOMAX) 0.4 MG CAPS capsule  tamsulosin 0.4 mg capsule  TAKE 1 CAPSULE BY MOUTH EVERY DAY     No facility-administered medications prior to visit.     Review of Systems:   Constitutional: No night sweats, fevers, chills, or lassitude. +fatigue, weight loss HEENT: No headaches CV:  No chest pain, orthopnea, PND, swelling in lower extremities Resp: +snoring GI:  No heartburn, indigestion GU: No nocturia  Skin: No rash, lesions, ulcerations MSK:  No joint pain or swelling.   Neuro: No memory impairment   Psych: No depression or anxiety. Mood stable. +sleep disturbance, stress      Physical Exam:  Patient is well-developed, well-nourished in no acute distress.  Resting comfortably at home.  No labored breathing.  Speech is clear and coherent with logical content.  Patient is alert and oriented at baseline.     Lab Results:  CBC    Component Value Date/Time   WBC 6.3 09/26/2023 1605   RBC 4.15 (L) 09/26/2023 1605   HGB 12.8 (L) 09/26/2023 1605   HGB 13.7 07/18/2023 1502  HCT 38.4 (L) 09/26/2023 1605   HCT 39.6 07/18/2023 1502   PLT 246.0 09/26/2023 1605   PLT 261 07/18/2023 1502   MCV 92.5 09/26/2023 1605   MCV 92 07/18/2023 1502   MCH 31.9 07/18/2023 1502   MCHC 33.3 09/26/2023 1605   RDW 14.9 09/26/2023 1605   RDW 12.5 07/18/2023 1502   LYMPHSABS 1.4 09/26/2023 1605   LYMPHSABS 1.5 07/18/2023 1502   MONOABS 0.7 09/26/2023 1605   EOSABS 0.2 09/26/2023 1605   EOSABS 0.1 07/18/2023 1502   BASOSABS 0.1 09/26/2023 1605   BASOSABS 0.1 07/18/2023 1502    BMET    Component Value Date/Time   NA 140 09/26/2023 1605   NA 141 07/18/2023 1502   K 4.3 09/26/2023 1605   CL 107 09/26/2023 1605   CO2 26 09/26/2023 1605   GLUCOSE 72 09/26/2023 1605   BUN 21 09/26/2023 1605   BUN 17 07/18/2023 1502   CREATININE 0.93 09/26/2023 1605   CALCIUM  9.2 09/26/2023 1605   GFRNONAA 68 09/06/2019 1642   GFRAA 78 09/06/2019 1642    BNP No results found for: BNP   Imaging:  MYOCARDIAL  PERFUSION/CT RAD READ Result Date: 01/19/2024 CLINICAL DATA:  This over-read does not include interpretation of cardiac or coronary anatomy or pathology. The cardiac SPECT CT interpretation by the cardiologist is attached. COMPARISON:  None Available. FINDINGS: Vascular: Aortic atherosclerosis. Prior median sternotomy and CABG. Coronary artery calcifications. Mediastinum/Nodes: Prominent mediastinal lymph nodes are favored reactive. Lungs/Pleura: Hypoventilatory change in the dependent lungs. Motion degraded examination of the lungs. Upper Abdomen: Cyst in the left kidney.  Cholelithiasis. Musculoskeletal: Prior median sternotomy.  Thoracic spondylosis. IMPRESSION: 1. Cholelithiasis. 2. Aortic atherosclerosis. Aortic Atherosclerosis (ICD10-I70.0). Electronically Signed   By: Reyes Holder M.D.   On: 01/19/2024 05:08   ECHOCARDIOGRAM COMPLETE Result Date: 01/18/2024    ECHOCARDIOGRAM REPORT   Patient Name:   EVERITT WENNER Date of Exam: 01/18/2024 Medical Rec #:  [id number]        Height:       73.0 in Accession #:    [id number]       Weight:       199.0 lb Date of Birth:  07-18-1950        BSA:          2.147 m Patient Age:    73 years         BP:           96/70 mmHg Patient Gender: M                HR:           70 bpm. Exam Location:  [address] Procedure: 2D Echo, 3D Echo and Strain Analysis (Both Spectral and Color Flow            Doppler were utilized during procedure). Indications:    R06.00 Dyspnea  History:        Patient has prior history of Echocardiogram examinations, most                 recent 03/10/2021. CAD, Prior CABG, Signs/Symptoms:Fatigue and                 Syncope; Risk Factors:Hypertension, Dyslipidemia and Sleep                 Apnea. Dysnea on exertion. Hyperglycemia. Obesity. S/P bariatric                 surgery.  Sonographer:    Jon Hacker RCS Referring Phys: [phone] MARK C SKAINS IMPRESSIONS  1. Left ventricular ejection fraction, by estimation, is 60 to 65%. Left ventricular  ejection fraction by 3D volume is 55 %. The left ventricle has normal function. The left ventricle has no regional wall motion abnormalities. There is moderate asymmetric left ventricular hypertrophy of the septal segment. Left ventricular diastolic parameters are consistent with Grade I diastolic dysfunction (impaired relaxation). The average left ventricular global longitudinal strain is -24.3 %. The global longitudinal strain is normal.  2. Right ventricular systolic function is normal. The right ventricular size is normal.  3. The mitral valve is normal in structure. Trivial mitral valve regurgitation. No evidence of mitral stenosis.  4. The aortic valve is calcified. Aortic valve regurgitation is not visualized. Aortic valve sclerosis/calcification is present, without any evidence of aortic stenosis.  5. The inferior vena cava is normal in size with greater than 50% respiratory variability, suggesting right atrial pressure of 3 mmHg. FINDINGS  Left Ventricle: Left ventricular ejection fraction, by estimation, is 60 to 65%. Left ventricular ejection fraction by 3D volume is 55 %. The left ventricle has normal function. The left ventricle has no regional wall motion abnormalities. The average left ventricular global longitudinal strain is -24.3 %. Strain was performed and the global longitudinal strain is normal. The left ventricular internal cavity size was normal in size. There is moderate asymmetric left ventricular hypertrophy of the septal segment. Left ventricular diastolic parameters are consistent with Grade I diastolic dysfunction (impaired relaxation). Right Ventricle: The right ventricular size is normal. No increase in right ventricular wall thickness. Right ventricular systolic function is normal. Left Atrium: Left atrial size was normal in size. Right Atrium: Right atrial size was normal in size. Pericardium: There is no evidence of pericardial effusion. Presence of epicardial fat layer. Mitral  Valve: The mitral valve is normal in structure. Trivial mitral valve regurgitation. No evidence of mitral valve stenosis. Tricuspid Valve: The tricuspid valve is normal in structure. Tricuspid valve regurgitation is not demonstrated. No evidence of tricuspid stenosis. Aortic Valve: The aortic valve is calcified. Aortic valve regurgitation is not visualized. Aortic valve sclerosis/calcification is present, without any evidence of aortic stenosis. Pulmonic Valve: The pulmonic valve was normal in structure. Pulmonic valve regurgitation is not visualized. No evidence of pulmonic stenosis. Aorta: The aortic root is normal in size and structure. Venous: The inferior vena cava is normal in size with greater than 50% respiratory variability, suggesting right atrial pressure of 3 mmHg. IAS/Shunts: No atrial level shunt detected by color flow Doppler. Additional Comments: 3D was performed not requiring image post processing on an independent workstation and was normal.  LEFT VENTRICLE PLAX 2D LVIDd:         3.81 cm         Diastology LVIDs:         2.59 cm         LV e' medial:    5.87 cm/s LV PW:         1.10 cm         LV E/e' medial:  7.2 LV IVS:        1.37 cm         LV e' lateral:   9.25 cm/s LVOT diam:     2.10 cm         LV E/e' lateral: 4.6 LV SV:         69 LV SV Index:   32  2D Longitudinal LVOT Area:     3.46 cm        Strain                                2D Strain GLS   -21.0 %                                (A4C):                                2D Strain GLS   -26.7 %                                (A3C):                                2D Strain GLS   -25.2 %                                (A2C):                                2D Strain GLS   -24.3 %                                Avg:                                 3D Volume EF                                LV 3D EF:    Left                                             ventricul                                             ar                                              ejection                                             fraction                                             by 3D  volume is                                             55 %.                                 3D Volume EF:                                3D EF:        55 %                                LV EDV:       116 ml                                LV ESV:       53 ml                                LV SV:        64 ml RIGHT VENTRICLE RV Basal diam:  2.24 cm RV S prime:     11.90 cm/s TAPSE (M-mode): 1.8 cm LEFT ATRIUM             Index        RIGHT ATRIUM           Index LA diam:        3.80 cm 1.77 cm/m   RA Area:     12.50 cm LA Vol (A2C):   43.4 ml 20.21 ml/m  RA Volume:   27.90 ml  12.99 ml/m LA Vol (A4C):   20.4 ml 9.50 ml/m LA Biplane Vol: 30.8 ml 14.35 ml/m  AORTIC VALVE LVOT Vmax:   94.30 cm/s LVOT Vmean:  62.900 cm/s LVOT VTI:    0.200 m  AORTA Ao Root diam: 3.80 cm Ao Asc diam:  4.00 cm MV E velocity: 42.40 cm/s MV A velocity: 73.40 cm/s  SHUNTS MV E/A ratio:  0.58        Systemic VTI:  0.20 m                            Systemic Diam: 2.10 cm Kardie Tobb DO Electronically signed by Dub Huntsman DO Signature Date/Time: 01/18/2024/4:05:54 PM    Final    MYOCARDIAL PERFUSION IMAGING Result Date: 01/18/2024   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 The study is low risk.   No ST deviation was noted.   LV perfusion is abnormal. There is no evidence of ischemia. There is evidence of infarction. Defect 1: There is a medium defect with moderate reduction in uptake present in the mid to basal inferior location(s) that is fixed. There is abnormal wall motion in the defect area. Consistent with infarction.   Left ventricular function is normal. End diastolic cavity size is normal. End systolic cavity size is normal.   CT images were obtained for attenuation correction and were examined for the presence of coronary calcium  when  appropriate.   Coronary calcium   assessment not performed due to prior revascularization.   Prior study available for comparison. There are changes compared to prior study. The left ventricular ejection fraction has increased. The fixed inferior perfusion abnormality is unchanged.   Electronically signed by Jerel Balding, MD    Administration History     None           No data to display          No results found for: NITRICOXIDE      Assessment & Plan:   Mild obstructive sleep apnea Persistent mild sleep apnea despite weight loss. Minimal cardiovascular risks associated with mild OSA. Suspect his insomniac symptoms are multifactorial related to caregiver burden and untreated mild OSA. Discussed potential treatment options. He would like to see if oral appliance will be a covered/affordable option. Referral to orthodontics placed today. If not, he will consider resuming CPAP therapy. Plan to reassess symptoms following treatment. Sleep hygiene reviewed. Safe driving practices reviewed. Healthy weight management encouraged.   Patient Instructions  Your repeat sleep study showed mild sleep apnea. We discussed that there are minimal cardiovascular risks associated with mild sleep apnea; however, it can impact your overall sleep quality and result in daytime fatigue. We also briefly reviewed treatment options including weight loss, side sleeping position, oral appliance, CPAP therapy   Inspire device would not be an option as this is reserved for moderate to severe sleep apnea.   You would like to see if an oral appliance is an option. I have sent a referral for this. Let me know if you don't hear from someone in the next few weeks.  If the oral appliance isn't affordable, we can see about getting you a new CPAP machine and putting you in the lab overnight to get you more comfortable on therapy.   Follow up in 6 months with any sleep provider, or sooner, if needed     Insomnia See above. Sleep hygiene reviewed   I discussed the assessment and treatment plan with the patient. The patient was provided an opportunity to ask questions and all were answered. The patient agreed with the plan and demonstrated an understanding of the instructions.   The patient was advised to call back or seek an in-person evaluation if the symptoms worsen or if the condition fails to improve as anticipated.  I provided 31 minutes of non-face-to-face time during this encounter.  Comer LULLA Rouleau, NP 02/01/2024  Pt aware and understands NP's role.

## 2024-02-01 NOTE — Assessment & Plan Note (Signed)
 Persistent mild sleep apnea despite weight loss. Minimal cardiovascular risks associated with mild OSA. Suspect his insomniac symptoms are multifactorial related to caregiver burden and untreated mild OSA. Discussed potential treatment options. He would like to see if oral appliance will be a covered/affordable option. Referral to orthodontics placed today. If not, he will consider resuming CPAP therapy. Plan to reassess symptoms following treatment. Sleep hygiene reviewed. Safe driving practices reviewed. Healthy weight management encouraged.   Patient Instructions  Your repeat sleep study showed mild sleep apnea. We discussed that there are minimal cardiovascular risks associated with mild sleep apnea; however, it can impact your overall sleep quality and result in daytime fatigue. We also briefly reviewed treatment options including weight loss, side sleeping position, oral appliance, CPAP therapy   Inspire device would not be an option as this is reserved for moderate to severe sleep apnea.   You would like to see if an oral appliance is an option. I have sent a referral for this. Let me know if you don't hear from someone in the next few weeks.  If the oral appliance isn't affordable, we can see about getting you a new CPAP machine and putting you in the lab overnight to get you more comfortable on therapy.   Follow up in 6 months with any sleep provider, or sooner, if needed

## 2024-02-02 ENCOUNTER — Encounter: Payer: Self-pay | Admitting: Internal Medicine

## 2024-02-02 ENCOUNTER — Ambulatory Visit: Admitting: Nurse Practitioner

## 2024-02-02 NOTE — Patient Instructions (Addendum)
 Blood work was ordered.       Medications changes include :   None    A referral was ordered and someone will call you to schedule an appointment.     Return in about 1 year (around 02/02/2025) for Physical Exam.    Health Maintenance, Male Adopting a healthy lifestyle and getting preventive care are important in promoting health and wellness. Ask your health care provider about: The right schedule for you to have regular tests and exams. Things you can do on your own to prevent diseases and keep yourself healthy. What should I know about diet, weight, and exercise? Eat a healthy diet  Eat a diet that includes plenty of vegetables, fruits, low-fat dairy products, and lean protein. Do not eat a lot of foods that are high in solid fats, added sugars, or sodium. Maintain a healthy weight Body mass index (BMI) is a measurement that can be used to identify possible weight problems. It estimates body fat based on height and weight. Your health care provider can help determine your BMI and help you achieve or maintain a healthy weight. Get regular exercise Get regular exercise. This is one of the most important things you can do for your health. Most adults should: Exercise for at least 150 minutes each week. The exercise should increase your heart rate and make you sweat (moderate-intensity exercise). Do strengthening exercises at least twice a week. This is in addition to the moderate-intensity exercise. Spend less time sitting. Even light physical activity can be beneficial. Watch cholesterol and blood lipids Have your blood tested for lipids and cholesterol at 75 years of age, then have this test every 5 years. You may need to have your cholesterol levels checked more often if: Your lipid or cholesterol levels are high. You are older than 75 years of age. You are at high risk for heart disease. What should I know about cancer screening? Many types of cancers can be  detected early and may often be prevented. Depending on your health history and family history, you may need to have cancer screening at various ages. This may include screening for: Colorectal cancer. Prostate cancer. Skin cancer. Lung cancer. What should I know about heart disease, diabetes, and high blood pressure? Blood pressure and heart disease High blood pressure causes heart disease and increases the risk of stroke. This is more likely to develop in people who have high blood pressure readings or are overweight. Talk with your health care provider about your target blood pressure readings. Have your blood pressure checked: Every 3-5 years if you are 19-52 years of age. Every year if you are 58 years old or older. If you are between the ages of 46 and 44 and are a current or former smoker, ask your health care provider if you should have a one-time screening for abdominal aortic aneurysm (AAA). Diabetes Have regular diabetes screenings. This checks your fasting blood sugar level. Have the screening done: Once every three years after age 14 if you are at a normal weight and have a low risk for diabetes. More often and at a younger age if you are overweight or have a high risk for diabetes. What should I know about preventing infection? Hepatitis B If you have a higher risk for hepatitis B, you should be screened for this virus. Talk with your health care provider to find out if you are at risk for hepatitis B infection. Hepatitis C Blood testing is recommended  for: Everyone born from [birth year] through 1967. Anyone with known risk factors for hepatitis C. Sexually transmitted infections (STIs) You should be screened each year for STIs, including gonorrhea and chlamydia, if: You are sexually active and are younger than 75 years of age. You are older than 75 years of age and your health care provider tells you that you are at risk for this type of infection. Your sexual activity has changed  since you were last screened, and you are at increased risk for chlamydia or gonorrhea. Ask your health care provider if you are at risk. Ask your health care provider about whether you are at high risk for HIV. Your health care provider may recommend a prescription medicine to help prevent HIV infection. If you choose to take medicine to prevent HIV, you should first get tested for HIV. You should then be tested every 3 months for as long as you are taking the medicine. Follow these instructions at home: Alcohol use Do not drink alcohol if your health care provider tells you not to drink. If you drink alcohol: Limit how much you have to 0-2 drinks a day. Know how much alcohol is in your drink. In the [place] one drink equals one 12 oz bottle of beer (355 mL), one 5 oz glass of wine (148 mL), or one 1 oz glass of hard liquor (44 mL). Lifestyle Do not use any products that contain nicotine or tobacco. These products include cigarettes, chewing tobacco, and vaping devices, such as e-cigarettes. If you need help quitting, ask your health care provider. Do not use street drugs. Do not share needles. Ask your health care provider for help if you need support or information about quitting drugs. General instructions Schedule regular health, dental, and eye exams. Stay current with your vaccines. Tell your health care provider if: You often feel depressed. You have ever been abused or do not feel safe at home. Summary Adopting a healthy lifestyle and getting preventive care are important in promoting health and wellness. Follow your health care provider's instructions about healthy diet, exercising, and getting tested or screened for diseases. Follow your health care provider's instructions on monitoring your cholesterol and blood pressure. This information is not intended to replace advice given to you by your health care provider. Make sure you discuss any questions you have with your health care  provider. Document Revised: 08/04/2020 Document Reviewed: 08/04/2020 Elsevier Patient Education  2024 [employer].

## 2024-02-02 NOTE — Assessment & Plan Note (Signed)
 Chronic Follows with urology Low-grade, low risk Monitoring only

## 2024-02-02 NOTE — Progress Notes (Unsigned)
 Subjective:    Patient ID: Aaron [name], male    DOB: 06-15-1950, 73 y.o.   MRN: [id number]     HPI Aaron [name] is here for a physical exam and his chronic medical problems.  Feels fatigued.   Sleeps at least 8 hrs, often naps 1-2 hrs a day.  He is not currently exercising.  He wonders what is contributing to his fatigue.  If it is age or something else.   Medications and allergies reviewed with patient and updated if appropriate.  Current Outpatient Medications on File Prior to Visit  Medication Sig Dispense Refill   aspirin  EC 81 MG tablet Take 1 tablet (81 mg total) by mouth daily. Swallow whole. 90 tablet 3   atorvastatin  (LIPITOR) 80 MG tablet TAKE 1 TABLET BY MOUTH EVERY DAY 90 tablet 2   cefadroxil  (DURICEF) 500 MG capsule Take 1 capsule (500 mg total) by mouth 2 (two) times daily. 180 capsule 1   clindamycin (CLEOCIN) 300 MG capsule Take 300 mg by mouth every 6 (six) hours.     Coenzyme Q10 (COQ-10 PO) Take 1 tablet by mouth daily.     finasteride (PROSCAR) 5 MG tablet Take 5 mg by mouth every morning.     irbesartan  (AVAPRO ) 150 MG tablet Take 1 tablet (150 mg total) by mouth daily. 90 tablet 3   linaclotide  (LINZESS ) 145 MCG CAPS capsule Take 1 capsule (145 mcg total) by mouth daily before breakfast. 90 capsule 1   nebivolol  (BYSTOLIC ) 10 MG tablet TAKE 1 TABLET BY MOUTH EVERY DAY 90 tablet 2   semaglutide -weight management (WEGOVY ) 1.7 MG/0.75ML SOAJ SQ injection Inject 1.7 mg into the skin every 7 (seven) days. 3 mL 3   tamsulosin (FLOMAX) 0.4 MG CAPS capsule tamsulosin 0.4 mg capsule  TAKE 1 CAPSULE BY MOUTH EVERY DAY     No current facility-administered medications on file prior to visit.    Review of Systems  Constitutional:  Negative for fever.  Eyes:  Negative for visual disturbance.  Respiratory:  Negative for cough, shortness of breath and wheezing.   Cardiovascular:  Negative for chest pain, palpitations and leg swelling.  Gastrointestinal:  Negative for  abdominal pain, blood in stool, constipation and diarrhea.       No gerd  Genitourinary:  Positive for difficulty urinating and frequency. Negative for dysuria.  Musculoskeletal:  Positive for neck pain. 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  Skin:  Negative for rash and wound.  Neurological:  Negative for light-headedness and headaches.  Psychiatric/Behavioral:  Negative for dysphoric mood. The patient is not nervous/anxious.        Objective:   Vitals:   02/03/24 1303  BP: 110/78  Pulse: 72  Temp: 98 F (36.7 C)  SpO2: 99%   Filed Weights   02/03/24 1303  Weight: 201 lb (91.2 kg)   Body mass index is 25.12 kg/m.  BP Readings from Last 3 Encounters:  02/03/24 110/78  12/09/23 110/70  11/23/23 106/71    Wt Readings from Last 3 Encounters:  02/03/24 201 lb (91.2 kg)  02/01/24 195 lb (88.5 kg)  12/09/23 199 lb (90.3 kg)      Physical Exam Constitutional: He appears well-developed and well-nourished. No distress.  HENT:  Head: Normocephalic and atraumatic.  Right Ear: External ear normal.  Left Ear: External ear normal.  Normal ear canals and TM b/l  Mouth/Throat: Oropharynx is clear and moist. Eyes: Conjunctivae and EOM are normal.  Neck: Neck supple. No tracheal deviation present. No thyromegaly present.  No carotid bruit  Cardiovascular: Normal rate, regular rhythm, normal heart sounds and intact distal pulses.   No murmur heard.  No lower extremity edema. Pulmonary/Chest: Effort normal and breath sounds normal. No respiratory distress. He has no wheezes. He has no rales.  Abdominal: Soft. He exhibits no distension. There is no tenderness.  Genitourinary: deferred  Lymphadenopathy:   He has no cervical adenopathy.  Skin: Skin is warm and dry. He is not diaphoretic.  Psychiatric: He has a normal mood and affect. His behavior is normal.         Assessment & Plan:   Physical exam: Screening blood work  ordered Exercise    none Weight  is normal Substance abuse   none   Reviewed recommended immunizations.   Health Maintenance  Topic Date Due   Hepatitis C Screening  Never done   Influenza Vaccine  10/28/2023   COVID-19 Vaccine (6 - 2025-26 season) 11/28/2023   Medicare Annual Wellness (AWV)  05/25/2024   Colonoscopy  12/14/2026   DTaP/Tdap/Td (5 - Td or Tdap) 04/27/2028   Pneumococcal Vaccine: 50+ Years  Completed   Zoster Vaccines- Shingrix  Completed   Meningococcal B Vaccine  Aged Out     See Problem List for Assessment and Plan of chronic medical problems.

## 2024-02-03 ENCOUNTER — Ambulatory Visit: Payer: Self-pay | Admitting: Internal Medicine

## 2024-02-03 ENCOUNTER — Ambulatory Visit (INDEPENDENT_AMBULATORY_CARE_PROVIDER_SITE_OTHER): Payer: Medicare PPO | Admitting: Internal Medicine

## 2024-02-03 ENCOUNTER — Encounter: Payer: Self-pay | Admitting: Internal Medicine

## 2024-02-03 VITALS — BP 110/78 | HR 72 | Temp 98.0°F | Ht 75.0 in | Wt 201.0 lb

## 2024-02-03 DIAGNOSIS — L089 Local infection of the skin and subcutaneous tissue, unspecified: Secondary | ICD-10-CM

## 2024-02-03 DIAGNOSIS — R5382 Chronic fatigue, unspecified: Secondary | ICD-10-CM

## 2024-02-03 DIAGNOSIS — R3912 Poor urinary stream: Secondary | ICD-10-CM

## 2024-02-03 DIAGNOSIS — Z0000 Encounter for general adult medical examination without abnormal findings: Secondary | ICD-10-CM | POA: Diagnosis not present

## 2024-02-03 DIAGNOSIS — Z8739 Personal history of other diseases of the musculoskeletal system and connective tissue: Secondary | ICD-10-CM

## 2024-02-03 DIAGNOSIS — R739 Hyperglycemia, unspecified: Secondary | ICD-10-CM

## 2024-02-03 DIAGNOSIS — N401 Enlarged prostate with lower urinary tract symptoms: Secondary | ICD-10-CM

## 2024-02-03 DIAGNOSIS — G629 Polyneuropathy, unspecified: Secondary | ICD-10-CM

## 2024-02-03 DIAGNOSIS — I2510 Atherosclerotic heart disease of native coronary artery without angina pectoris: Secondary | ICD-10-CM

## 2024-02-03 DIAGNOSIS — C61 Malignant neoplasm of prostate: Secondary | ICD-10-CM

## 2024-02-03 DIAGNOSIS — G4733 Obstructive sleep apnea (adult) (pediatric): Secondary | ICD-10-CM

## 2024-02-03 DIAGNOSIS — K5909 Other constipation: Secondary | ICD-10-CM

## 2024-02-03 DIAGNOSIS — I10 Essential (primary) hypertension: Secondary | ICD-10-CM

## 2024-02-03 DIAGNOSIS — E7800 Pure hypercholesterolemia, unspecified: Secondary | ICD-10-CM

## 2024-02-03 LAB — CBC WITH DIFFERENTIAL/PLATELET
Basophils Absolute: 0.1 K/uL (ref 0.0–0.1)
Basophils Relative: 1 % (ref 0.0–3.0)
Eosinophils Absolute: 0.1 K/uL (ref 0.0–0.7)
Eosinophils Relative: 1 % (ref 0.0–5.0)
HCT: 40 % (ref 39.0–52.0)
Hemoglobin: 13.4 g/dL (ref 13.0–17.0)
Lymphocytes Relative: 19.7 % (ref 12.0–46.0)
Lymphs Abs: 1.2 K/uL (ref 0.7–4.0)
MCHC: 33.7 g/dL (ref 30.0–36.0)
MCV: 93.1 fl (ref 78.0–100.0)
Monocytes Absolute: 0.6 K/uL (ref 0.1–1.0)
Monocytes Relative: 9.1 % (ref 3.0–12.0)
Neutro Abs: 4.3 K/uL (ref 1.4–7.7)
Neutrophils Relative %: 69.2 % (ref 43.0–77.0)
Platelets: 243 K/uL (ref 150.0–400.0)
RBC: 4.29 Mil/uL (ref 4.22–5.81)
RDW: 14.8 % (ref 11.5–15.5)
WBC: 6.3 K/uL (ref 4.0–10.5)

## 2024-02-03 LAB — LIPID PANEL
Cholesterol: 142 mg/dL (ref 0–200)
HDL: 59.6 mg/dL (ref >39.00)
LDL Cholesterol: 62 mg/dL (ref 0–99)
NonHDL: 82.84
Total CHOL/HDL Ratio: 2
Triglycerides: 104 mg/dL (ref 0.0–149.0)
VLDL: 20.8 mg/dL (ref 0.0–40.0)

## 2024-02-03 LAB — COMPREHENSIVE METABOLIC PANEL WITH GFR
ALT: 15 U/L (ref 0–53)
AST: 20 U/L (ref 0–37)
Albumin: 4 g/dL (ref 3.5–5.2)
Alkaline Phosphatase: 59 U/L (ref 39–117)
BUN: 15 mg/dL (ref 6–23)
CO2: 28 meq/L (ref 19–32)
Calcium: 9.6 mg/dL (ref 8.4–10.5)
Chloride: 105 meq/L (ref 96–112)
Creatinine, Ser: 1.02 mg/dL (ref 0.40–1.50)
GFR: 72.09 mL/min (ref >60.00)
Glucose, Bld: 81 mg/dL (ref 70–99)
Potassium: 4.5 meq/L (ref 3.5–5.1)
Sodium: 140 meq/L (ref 135–145)
Total Bilirubin: 0.8 mg/dL (ref 0.2–1.2)
Total Protein: 7.4 g/dL (ref 6.0–8.3)

## 2024-02-03 LAB — HEMOGLOBIN A1C: Hgb A1c MFr Bld: 5.1 % (ref 4.6–6.5)

## 2024-02-03 NOTE — Assessment & Plan Note (Deleted)
 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

## 2024-02-03 NOTE — Assessment & Plan Note (Signed)
 Chronic Continue atorvastatin  80 mg daily CMP, lipid panel Regular exercise and healthy diet encouraged

## 2024-02-03 NOTE — Assessment & Plan Note (Signed)
 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

## 2024-02-03 NOTE — Assessment & Plan Note (Addendum)
 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

## 2024-02-03 NOTE — Assessment & Plan Note (Signed)
 Chronic Blood pressure controlled Managed by Dr Jeffrie CBC, CMP Continue irbesartan  150 mg daily, bystolic  10 mg daily

## 2024-02-03 NOTE — Assessment & Plan Note (Addendum)
 Chronic Mild in nature No on cpap Not eligible for inspire Would like to try oral appliance --- going to see someone to be evaluated

## 2024-02-03 NOTE — Assessment & Plan Note (Signed)
 Chronic Sees cardiology twice a year - Dr Jeffrie No chest pain, sob On ASA 81 mg daily, atorvastatin  80 mg daily, Bystolic  10 mg daily, and irbesartan  150 mg daily

## 2024-02-03 NOTE — Assessment & Plan Note (Addendum)
 Chronic Following with urology Controlled, Stable Continue Flomax 0.4 mg daily, Proscar 5 mg daily

## 2024-02-03 NOTE — Assessment & Plan Note (Signed)
 Chronic Last A1c 5.3% Check a1c Low sugar / carb diet Stressed regular exercise Currently on Wegovy  1.7 mg weekly

## 2024-02-03 NOTE — Assessment & Plan Note (Signed)
 Chronic Likely multifactorial He has mild sleep apnea which is currently not treated, which could be contributing some He is not exercising-discussed with him this is likely contributing, but hard to know how much-stressed regular exercise.  [facility] just [facility] [employer] and stressed that he needs to start next week. ?  Low testosterone contributing-Will check testosterone level Check CBC, CMP His B12 and vitamin D  levels were good when we last checked them

## 2024-02-03 NOTE — Assessment & Plan Note (Signed)
Chronic Controlled, Stable Continue Linzess 145 mcg daily

## 2024-02-03 NOTE — Assessment & Plan Note (Signed)
 Chronic Likely inherited - has family history

## 2024-02-08 ENCOUNTER — Other Ambulatory Visit: Payer: Self-pay | Admitting: Cardiology

## 2024-02-08 LAB — TESTOSTERONE, FREE & TOTAL
Free Testosterone: 20.7 pg/mL — ABNORMAL LOW (ref 30.0–135.0)
Testosterone, Total, LC-MS-MS: 257 ng/dL (ref 250–1100)

## 2024-02-09 ENCOUNTER — Other Ambulatory Visit (HOSPITAL_COMMUNITY): Payer: Self-pay

## 2024-02-10 MED ORDER — NEBIVOLOL HCL 10 MG PO TABS: 10.0000 mg | ORAL_TABLET | Freq: Every day | ORAL | 3 refills | Status: AC

## 2024-02-15 MED ORDER — CEFADROXIL 500 MG PO CAPS: 500.0000 mg | ORAL_CAPSULE | Freq: Two times a day (BID) | ORAL | 3 refills | Status: AC

## 2024-02-20 ENCOUNTER — Other Ambulatory Visit: Payer: Self-pay | Admitting: Cardiology

## 2024-02-22 ENCOUNTER — Other Ambulatory Visit: Payer: Self-pay | Admitting: Cardiology

## 2024-02-22 MED ORDER — ATORVASTATIN CALCIUM 80 MG PO TABS: 80.0000 mg | ORAL_TABLET | Freq: Every day | ORAL | 2 refills | Status: AC

## 2024-02-22 MED ORDER — IRBESARTAN 150 MG PO TABS: 150.0000 mg | ORAL_TABLET | Freq: Every day | ORAL | 3 refills | Status: AC

## 2024-02-22 NOTE — Addendum Note (Signed)
 Addended by: JOSHUA ANDREZ PARAS on: 02/22/2024 03:31 PM   Modules accepted: Orders

## 2024-02-27 ENCOUNTER — Other Ambulatory Visit (HOSPITAL_COMMUNITY): Payer: Self-pay

## 2024-04-02 ENCOUNTER — Ambulatory Visit: Admitting: Podiatry

## 2024-04-02 VITALS — Ht 75.0 in | Wt 201.0 lb

## 2024-04-02 DIAGNOSIS — M2041 Other hammer toe(s) (acquired), right foot: Secondary | ICD-10-CM

## 2024-04-04 NOTE — Progress Notes (Signed)
"  °  Subjective:  Patient ID: Aaron [name], male    DOB: 1951-02-10,  MRN: [id number]  Chief Complaint  Patient presents with   Foot Problem    Rm 6 Patient is here to f/u on right index toe wound. Wound is scabbed over with no drainage.      74 y.o. male returns for follow-up.  Has had small abrasion on the second toe.  No active drainage.  Review of Systems: Negative except as noted in the HPI. Denies N/V/F/Ch.   Objective:  There were no vitals filed for this visit. Body mass index is 25.12 kg/m. Constitutional Well developed. Well nourished.  Vascular Foot warm and well perfused. Capillary refill normal to all digits.  Calf is soft and supple, no posterior calf or knee pain, negative Homans' sign  Neurologic Normal speech. Oriented to person, place, and time. Epicritic sensation to light touch grossly absent on the right foot  Dermatologic Plantar right midfoot ulceration has remained completely healed.  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 Hammertoe of right foot    Plan:  Patient was evaluated and treated and all questions answered.  Chronic right foot deformities and ulcer, We again reviewed his progression and chronic deformities, we discussed the crossover toe dislocation on the right foot that I discussed with him is likely unreconstructable without  significant 1st and 2nd ray corrections with first MTP fusion, second met head excision and toe reconstruction and the benefits versus risk is likely not worth it.  I did discuss with him that certainly could consider amputation of the second toe electively if it continues to threaten ulceration and/or pain from rubbing on the top of his shoes.  If this continues to worsen she will let me know and follow-up with me as needed.  Otherwise pleased with his progress and his foot remains healed in a plantigrade position without new threatening plantar ulceration  Return if symptoms worsen or fail to improve.  "

## 2024-04-19 ENCOUNTER — Other Ambulatory Visit (HOSPITAL_BASED_OUTPATIENT_CLINIC_OR_DEPARTMENT_OTHER): Payer: Self-pay

## 2024-05-28 ENCOUNTER — Ambulatory Visit: Payer: Medicare PPO

## 2024-05-29 ENCOUNTER — Ambulatory Visit
